# Patient Record
Sex: Female | Born: 1945
Health system: Southern US, Community
[De-identification: ages and names within clinical notes are randomized; demographics above are authoritative.]

## PROBLEM LIST (undated history)

## (undated) ENCOUNTER — Emergency Department (HOSPITAL_COMMUNITY): Payer: Medicare Other | Source: Home / Self Care

## (undated) DIAGNOSIS — F32A Depression, unspecified: Secondary | ICD-10-CM

## (undated) DIAGNOSIS — R011 Cardiac murmur, unspecified: Secondary | ICD-10-CM

## (undated) DIAGNOSIS — C50912 Malignant neoplasm of unspecified site of left female breast: Secondary | ICD-10-CM

## (undated) DIAGNOSIS — G610 Guillain-Barre syndrome: Secondary | ICD-10-CM

## (undated) DIAGNOSIS — M545 Low back pain, unspecified: Secondary | ICD-10-CM

## (undated) DIAGNOSIS — F329 Major depressive disorder, single episode, unspecified: Secondary | ICD-10-CM

## (undated) DIAGNOSIS — J449 Chronic obstructive pulmonary disease, unspecified: Secondary | ICD-10-CM

## (undated) DIAGNOSIS — E785 Hyperlipidemia, unspecified: Secondary | ICD-10-CM

## (undated) DIAGNOSIS — G8929 Other chronic pain: Secondary | ICD-10-CM

## (undated) DIAGNOSIS — J45909 Unspecified asthma, uncomplicated: Secondary | ICD-10-CM

## (undated) DIAGNOSIS — J302 Other seasonal allergic rhinitis: Secondary | ICD-10-CM

## (undated) DIAGNOSIS — K219 Gastro-esophageal reflux disease without esophagitis: Secondary | ICD-10-CM

## (undated) HISTORY — PX: BREAST SURGERY: SHX581

## (undated) HISTORY — DX: Low back pain, unspecified: M54.50

## (undated) HISTORY — DX: Major depressive disorder, single episode, unspecified: F32.9

## (undated) HISTORY — DX: Low back pain: M54.5

## (undated) HISTORY — DX: Hyperlipidemia, unspecified: E78.5

## (undated) HISTORY — DX: Malignant neoplasm of unspecified site of left female breast: C50.912

## (undated) HISTORY — PX: CHOLECYSTECTOMY: SHX55

## (undated) HISTORY — PX: TONSILLECTOMY: SUR1361

## (undated) HISTORY — DX: Unspecified asthma, uncomplicated: J45.909

## (undated) HISTORY — DX: Other seasonal allergic rhinitis: J30.2

## (undated) HISTORY — DX: Other chronic pain: G89.29

## (undated) HISTORY — DX: Depression, unspecified: F32.A

---

## 1995-10-05 DIAGNOSIS — C50912 Malignant neoplasm of unspecified site of left female breast: Secondary | ICD-10-CM

## 1995-10-05 HISTORY — DX: Malignant neoplasm of unspecified site of left female breast: C50.912

## 2014-01-14 ENCOUNTER — Encounter: Payer: Self-pay | Admitting: *Deleted

## 2014-01-15 ENCOUNTER — Ambulatory Visit (INDEPENDENT_AMBULATORY_CARE_PROVIDER_SITE_OTHER): Payer: Medicare Other | Admitting: Cardiology

## 2014-01-15 ENCOUNTER — Encounter: Payer: Self-pay | Admitting: Cardiology

## 2014-01-15 VITALS — BP 123/79 | HR 65 | Ht 59.0 in | Wt 126.8 lb

## 2014-01-15 DIAGNOSIS — I340 Nonrheumatic mitral (valve) insufficiency: Secondary | ICD-10-CM

## 2014-01-15 DIAGNOSIS — E785 Hyperlipidemia, unspecified: Secondary | ICD-10-CM | POA: Insufficient documentation

## 2014-01-15 DIAGNOSIS — I059 Rheumatic mitral valve disease, unspecified: Secondary | ICD-10-CM

## 2014-01-15 DIAGNOSIS — R079 Chest pain, unspecified: Secondary | ICD-10-CM | POA: Insufficient documentation

## 2014-01-15 MED ORDER — NITROGLYCERIN 0.4 MG SL SUBL
0.4000 mg | SUBLINGUAL_TABLET | SUBLINGUAL | Status: DC | PRN
Start: 2014-01-15 — End: 2020-11-05

## 2014-01-15 NOTE — Patient Instructions (Signed)
Your physician has recommended you make the following change in your medication:  Start: Nitro SL 0.4 MG take 1 tablet and place under tongue and let dissolve as needed for chest pain. Repeat every 5 minutes X3 times. If you get no relief by 3rd tablet call 911 or go to ER.  Continue all other medications the same.   Your physician has requested that you have a stress echocardiogram. For further information please visit [email or web address]. Please follow instruction sheet as given.  Your follow up with Dr. Harl Bowie will be based on your test results.

## 2014-01-15 NOTE — Progress Notes (Signed)
Clinical Summary Alexa Nichols is a 68 y.o.female  1. Mitral regurgitation - recent echo by pcp showed mild to moderate MR per report - she has described some SOB, DOE mainly with activities. Denies any LE edema, no orthopnea.    2. Chest pain - started approx 1 year. Describes a pressure midchest 6/10, +SOB, can have occsasional palps. Nothing makes better or worst. Lasts for few minutes. Pain never occurs at rest. No change in frequency, mild increase in severity. Occurs daily.   CAD risk factors: HL, former tobacco x 15 years and quit 53 years, maternal uncle 42s.     Past Medical History  Diagnosis Date  . Other and unspecified hyperlipidemia   . Asthma   . Depression   . Seasonal allergies   . Chronic low back pain   . Lobular carcinoma of left breast 1997    in situ     Allergies  Allergen Reactions  . Other Rash    DURAGESIC/ANALGESICS     Current Outpatient Prescriptions  Medication Sig Dispense Refill  . Calcium Carbonate-Vitamin D (CALTRATE 600+D) 600-400 MG-UNIT per tablet Take 1 tablet by mouth daily.      . fentaNYL (DURAGESIC - DOSED MCG/HR) 25 MCG/HR patch Place 25 mcg onto the skin every 3 (three) days.      . Fluticasone-Salmeterol (ADVAIR) 100-50 MCG/DOSE AEPB Inhale 1 puff into the lungs 2 (two) times daily.      Marland Kitchen lidocaine (LIDODERM) 5 % Place 1 patch onto the skin daily. Remove & Discard patch within 12 hours or as directed by MD      . mometasone-formoterol (DULERA) 200-5 MCG/ACT AERO Inhale 2 puffs into the lungs 2 (two) times daily.      Marland Kitchen omeprazole (PRILOSEC) 20 MG capsule Take 20 mg by mouth daily.      . predniSONE (DELTASONE) 20 MG tablet Take 20 mg by mouth as directed.      . Red Yeast Rice 600 MG CAPS Take 1 capsule by mouth 2 (two) times daily.      . simvastatin (ZOCOR) 20 MG tablet Take 20 mg by mouth daily.       No current facility-administered medications for this visit.     Past Surgical History  Procedure Laterality  Date  . Cholecystectomy    . Cesarean section  1986     Allergies  Allergen Reactions  . Other Rash    DURAGESIC/ANALGESICS      Family History  Problem Relation Age of Onset  . Other Father     head trauma  . Breast cancer Sister     X's 2 sisters  . Osteoporosis Mother      Social History Ms. Klenke reports that she quit smoking about 30 years ago. Her smoking use included Cigarettes. She smoked 0.00 packs per day. She does not have any smokeless tobacco history on file. Ms. Alvis has no alcohol history on file.   Review of Systems CONSTITUTIONAL: No weight loss, fever, chills, weakness or fatigue.  HEENT: Eyes: No visual loss, blurred vision, double vision or yellow sclerae.No hearing loss, sneezing, congestion, runny nose or sore throat.  SKIN: No rash or itching.  CARDIOVASCULAR: per HPI RESPIRATORY: No cough or sputum.  GASTROINTESTINAL: No anorexia, nausea, vomiting or diarrhea. No abdominal pain or blood.  GENITOURINARY: No burning on urination, no polyuria NEUROLOGICAL: No headache, dizziness, syncope, paralysis, ataxia, numbness or tingling in the extremities. No change in bowel or bladder control.  MUSCULOSKELETAL: No muscle, back pain, joint pain or stiffness.  LYMPHATICS: No enlarged nodes. No history of splenectomy.  PSYCHIATRIC: No history of depression or anxiety.  ENDOCRINOLOGIC: No reports of sweating, cold or heat intolerance. No polyuria or polydipsia.  Marland Kitchen   Physical Examination p 65 bp 123/79 Wt 126 lbs BMI 26 Gen: resting comfortably, no acute distress HEENT: no scleral icterus, pupils equal round and reactive, no palptable cervical adenopathy,  CV: RRR, no m/r/g, no JVD, no carotid bruits Resp: Clear to auscultation bilaterally GI: abdomen is soft, non-tender, non-distended, normal bowel sounds, no hepatosplenomegaly MSK: extremities are warm, no edema.  Skin: warm, no rash Neuro:  no focal deficits Psych: appropriate  affect   Diagnostic Studies 12/31/13 Echo LVEF 29-52%, normal diastolic function, mild LAE, mild to mod MR, mild TR  12/20/13 CXR No active disease  Assessment and Plan  1. Mitral regurgitation - mild to moderate by recent echo, normal LVEF, No chamber dilatation by report.  - follow up echo annually  2. Chest pain - describes exertional symptoms suggestive of stable angina. - she does have CAD risk factors - will obtain exercise stress echo to evaluate for ischemia and further risk stratify.       Arnoldo Lenis, M.D., F.A.C.C.

## 2014-01-21 ENCOUNTER — Encounter (HOSPITAL_COMMUNITY): Payer: Self-pay

## 2014-01-21 ENCOUNTER — Ambulatory Visit (HOSPITAL_COMMUNITY)
Admission: RE | Admit: 2014-01-21 | Discharge: 2014-01-21 | Disposition: A | Discharge status: Home / Self Care | Payer: Medicare Other | Source: Ambulatory Visit | Attending: Cardiology | Admitting: Cardiology

## 2014-01-21 DIAGNOSIS — I059 Rheumatic mitral valve disease, unspecified: Secondary | ICD-10-CM | POA: Insufficient documentation

## 2014-01-21 DIAGNOSIS — R079 Chest pain, unspecified: Secondary | ICD-10-CM | POA: Insufficient documentation

## 2014-01-21 DIAGNOSIS — R072 Precordial pain: Secondary | ICD-10-CM

## 2014-01-21 DIAGNOSIS — E785 Hyperlipidemia, unspecified: Secondary | ICD-10-CM | POA: Insufficient documentation

## 2014-01-21 DIAGNOSIS — Z87891 Personal history of nicotine dependence: Secondary | ICD-10-CM | POA: Insufficient documentation

## 2014-01-21 DIAGNOSIS — R0609 Other forms of dyspnea: Secondary | ICD-10-CM | POA: Insufficient documentation

## 2014-01-21 DIAGNOSIS — R0989 Other specified symptoms and signs involving the circulatory and respiratory systems: Secondary | ICD-10-CM | POA: Insufficient documentation

## 2014-01-21 NOTE — Progress Notes (Signed)
Stress Lab Nurses Notes - [facility]  Chela Sutphen 01/21/2014 Reason for doing test: Chest Pain Type of test: Stress Echo Nurse performing test: Gerrit Halls, RN Nuclear Medicine Tech: Not Applicable Echo Tech: Jamison Neighbor MD performing test: Branch/K.Lawrence NP Family MD: Woody Seller Test explained and consent signed: yes IV started: No IV started Symptoms: SOB & Fatigue Treatment/Intervention: None Reason test stopped: fatigue and reached target HR After recovery IV was: NA Patient to return to [place]. Med at :NA Patient discharged: Home Patient's Condition upon discharge was: stable Comments: During test peak BP 149/75 & HR 150.  Recovery BP 128/67 & HR 82.  Symptoms resolved in recovery. Donnajean Lopes

## 2014-01-21 NOTE — Progress Notes (Signed)
*  PRELIMINARY RESULTS* Echocardiogram Echocardiogram Stress Test has been performed.  Alexa Nichols 01/21/2014, 12:04 PM

## 2014-01-23 ENCOUNTER — Telehealth: Payer: Self-pay | Admitting: Cardiology

## 2014-01-23 NOTE — Telephone Encounter (Signed)
Message copied by Lewayne Bunting on Wed Jan 23, 2014  3:14 PM ------      Message from: [place] F      Created: Wed Jan 23, 2014 12:27 PM       Normal stress test. She should see me back in 1 year to continue to follow her leaky mitral valve            Zandra Abts MD ------

## 2014-01-23 NOTE — Telephone Encounter (Signed)
Pt informed of results and verbalized understanding.  

## 2015-03-27 ENCOUNTER — Ambulatory Visit (INDEPENDENT_AMBULATORY_CARE_PROVIDER_SITE_OTHER): Payer: Medicare Other | Admitting: Cardiology

## 2015-03-27 ENCOUNTER — Encounter: Payer: Self-pay | Admitting: Cardiology

## 2015-03-27 VITALS — BP 114/76 | HR 69 | Ht 59.0 in | Wt 127.8 lb

## 2015-03-27 DIAGNOSIS — R0789 Other chest pain: Secondary | ICD-10-CM | POA: Diagnosis not present

## 2015-03-27 DIAGNOSIS — I340 Nonrheumatic mitral (valve) insufficiency: Secondary | ICD-10-CM | POA: Diagnosis not present

## 2015-03-27 DIAGNOSIS — I447 Left bundle-branch block, unspecified: Secondary | ICD-10-CM

## 2015-03-27 NOTE — Progress Notes (Signed)
Clinical Summary Ms. Alexa Nichols is a 69 y.o.female seen today for follow up of the following medical problems.    1. Mitral regurgitation - mild to moderate by echo - reports chronic SOB she assocaites with her asthma, improves with inhalers. No LE edema, no orthopnea, no PND.   2. Chest pain - notes some reflux like pain, radiates up midchest into throat, better with cold liquid. - no exertional pain. Recently had negative stress echo     Past Medical History  Diagnosis Date  . Other and unspecified hyperlipidemia   . Asthma   . Depression   . Seasonal allergies   . Chronic low back pain   . Lobular carcinoma of left breast 1997    in situ     Allergies  Allergen Reactions  . Other Rash    DURAGESIC/ANALGESICS     Current Outpatient Prescriptions  Medication Sig Dispense Refill  . ALPRAZolam (XANAX) 0.5 MG tablet Take 0.25 mg by mouth as needed for anxiety.    Marland Kitchen aspirin 81 MG tablet Take 81 mg by mouth daily.    . Calcium Carb-Cholecalciferol (CALCIUM 600 + D PO) Take 1 tablet by mouth daily.    . Cholecalciferol (VITAMIN D3) 1000 UNITS CAPS Take 1 capsule by mouth daily.    . fentaNYL (DURAGESIC - DOSED MCG/HR) 25 MCG/HR patch Place 25 mcg onto the skin as needed.     . Fluticasone-Salmeterol (ADVAIR) 100-50 MCG/DOSE AEPB Inhale 1 puff into the lungs as needed.     Marland Kitchen HYDROcodone-acetaminophen (NORCO/VICODIN) 5-325 MG per tablet Take 1 tablet by mouth as needed for moderate pain.    Marland Kitchen lidocaine (LIDODERM) 5 % Place 1 patch onto the skin as needed. Remove & Discard patch within 12 hours or as directed by MD    . methocarbamol (ROBAXIN) 500 MG tablet Take 500 mg by mouth as needed for muscle spasms.    . nitroGLYCERIN (NITROSTAT) 0.4 MG SL tablet Place 1 tablet (0.4 mg total) under the tongue every 5 (five) minutes as needed for chest pain. 25 tablet 3  . omeprazole (PRILOSEC) 20 MG capsule Take 20 mg by mouth daily.    . predniSONE (DELTASONE) 20 MG tablet Take 20  mg by mouth as needed.     . simvastatin (ZOCOR) 20 MG tablet Take 20 mg by mouth daily.    Marland Kitchen tiZANidine (ZANAFLEX) 4 MG tablet Take 4 mg by mouth at bedtime as needed for muscle spasms.    Marland Kitchen zolpidem (AMBIEN) 10 MG tablet Take 5 mg by mouth at bedtime.     No current facility-administered medications for this visit.     Past Surgical History  Procedure Laterality Date  . Cholecystectomy    . Cesarean section  1986     Allergies  Allergen Reactions  . Other Rash    DURAGESIC/ANALGESICS      Family History  Problem Relation Age of Onset  . Other Father     head trauma  . Breast cancer Sister     X's 2 sisters  . Osteoporosis Mother      Social History Ms. Lords reports that she quit smoking about 31 years ago. Her smoking use included Cigarettes. She does not have any smokeless tobacco history on file. Ms. Mckell has no alcohol history on file.   Review of Systems CONSTITUTIONAL: No weight loss, fever, chills, weakness or fatigue.  HEENT: Eyes: No visual loss, blurred vision, double vision or yellow sclerae.No hearing loss,  sneezing, congestion, runny nose or sore throat.  SKIN: No rash or itching.  CARDIOVASCULAR: per HPI RESPIRATORY: No shortness of breath, cough or sputum.  GASTROINTESTINAL: No anorexia, nausea, vomiting or diarrhea. No abdominal pain or blood.  GENITOURINARY: No burning on urination, no polyuria NEUROLOGICAL: No headache, dizziness, syncope, paralysis, ataxia, numbness or tingling in the extremities. No change in bowel or bladder control.  MUSCULOSKELETAL: No muscle, back pain, joint pain or stiffness.  LYMPHATICS: No enlarged nodes. No history of splenectomy.  PSYCHIATRIC: No history of depression or anxiety.  ENDOCRINOLOGIC: No reports of sweating, cold or heat intolerance. No polyuria or polydipsia.  Marland Kitchen   Physical Examination Filed Vitals:   03/27/15 1254  BP: 114/76  Pulse: 69   Filed Vitals:   03/27/15 1254  Height: 4\' 11"   (1.499 m)  Weight: 127 lb 12.8 oz (57.97 kg)    Gen: resting comfortably, no acute distress HEENT: no scleral icterus, pupils equal round and reactive, no palptable cervical adenopathy,  CV: RRR, no m/r/g, no JVD Resp: Clear to auscultation bilaterally GI: abdomen is soft, non-tender, non-distended, normal bowel sounds, no hepatosplenomegaly MSK: extremities are warm, no edema.  Skin: warm, no rash Neuro:  no focal deficits Psych: appropriate affect   Diagnostic Studies  12/31/13 Echo LVEF 31-12%, normal diastolic function, mild LAE, mild to mod MR, mild TR  01/2014 stress echo Impressions:  - Normal study after maximal exercise. Duke treadmill score is 7 consistent with low risk for major cardiac events. Very good functional capacity (140% of age and gender predicted.  Clinic EKG reviewed, LBBB  Assessment and Plan   1. Mitral regurgitation - mild to moderate by recent echo, normal LVEF, No chamber dilatation by report.  - continue to follow clinically  2. Chest pain - symptoms consistent with GI etiology - negative stress echo 01/2014 - no further cardiac workup at this time  3. Left bundle branch block - new compared to prior EKG. Recent cardiac testing last year with normal echo and normal stress echo, no evidence of underlying cardiac disease - continue to follow     Arnoldo Lenis, M.D.

## 2015-03-27 NOTE — Patient Instructions (Signed)
Your physician wants you to follow-up in: 1 year with Dr. Branch  You will receive a reminder letter in the mail two months in advance. If you don't receive a letter, please call our office to schedule the follow-up appointment.  Your physician recommends that you continue on your current medications as directed. Please refer to the Current Medication list given to you today.  Thank you for choosing [facility]!!   

## 2015-09-30 ENCOUNTER — Encounter: Payer: Self-pay | Admitting: Cardiology

## 2015-10-07 DIAGNOSIS — Z803 Family history of malignant neoplasm of breast: Secondary | ICD-10-CM | POA: Diagnosis not present

## 2015-10-07 DIAGNOSIS — Z853 Personal history of malignant neoplasm of breast: Secondary | ICD-10-CM | POA: Diagnosis not present

## 2015-10-14 DIAGNOSIS — H2513 Age-related nuclear cataract, bilateral: Secondary | ICD-10-CM | POA: Diagnosis not present

## 2015-10-14 DIAGNOSIS — H538 Other visual disturbances: Secondary | ICD-10-CM | POA: Diagnosis not present

## 2015-10-14 DIAGNOSIS — H35372 Puckering of macula, left eye: Secondary | ICD-10-CM | POA: Diagnosis not present

## 2015-10-23 DIAGNOSIS — Z853 Personal history of malignant neoplasm of breast: Secondary | ICD-10-CM | POA: Diagnosis not present

## 2015-10-23 DIAGNOSIS — Z803 Family history of malignant neoplasm of breast: Secondary | ICD-10-CM | POA: Diagnosis not present

## 2015-10-23 DIAGNOSIS — J45909 Unspecified asthma, uncomplicated: Secondary | ICD-10-CM | POA: Diagnosis not present

## 2015-10-23 DIAGNOSIS — Z7982 Long term (current) use of aspirin: Secondary | ICD-10-CM | POA: Diagnosis not present

## 2015-10-23 DIAGNOSIS — R195 Other fecal abnormalities: Secondary | ICD-10-CM | POA: Diagnosis not present

## 2015-10-23 DIAGNOSIS — Z7951 Long term (current) use of inhaled steroids: Secondary | ICD-10-CM | POA: Diagnosis not present

## 2015-10-23 DIAGNOSIS — Z1211 Encounter for screening for malignant neoplasm of colon: Secondary | ICD-10-CM | POA: Diagnosis not present

## 2015-10-23 DIAGNOSIS — Z79899 Other long term (current) drug therapy: Secondary | ICD-10-CM | POA: Diagnosis not present

## 2015-11-11 DIAGNOSIS — Z853 Personal history of malignant neoplasm of breast: Secondary | ICD-10-CM | POA: Diagnosis not present

## 2015-12-07 DIAGNOSIS — K529 Noninfective gastroenteritis and colitis, unspecified: Secondary | ICD-10-CM | POA: Diagnosis not present

## 2015-12-07 DIAGNOSIS — K922 Gastrointestinal hemorrhage, unspecified: Secondary | ICD-10-CM | POA: Diagnosis not present

## 2015-12-07 DIAGNOSIS — R1030 Lower abdominal pain, unspecified: Secondary | ICD-10-CM | POA: Diagnosis not present

## 2015-12-07 DIAGNOSIS — R938 Abnormal findings on diagnostic imaging of other specified body structures: Secondary | ICD-10-CM | POA: Diagnosis not present

## 2015-12-07 DIAGNOSIS — R1084 Generalized abdominal pain: Secondary | ICD-10-CM | POA: Diagnosis not present

## 2015-12-07 DIAGNOSIS — D72829 Elevated white blood cell count, unspecified: Secondary | ICD-10-CM | POA: Diagnosis not present

## 2015-12-07 DIAGNOSIS — A088 Other specified intestinal infections: Secondary | ICD-10-CM | POA: Diagnosis not present

## 2015-12-07 DIAGNOSIS — E785 Hyperlipidemia, unspecified: Secondary | ICD-10-CM | POA: Diagnosis not present

## 2015-12-07 DIAGNOSIS — J45909 Unspecified asthma, uncomplicated: Secondary | ICD-10-CM | POA: Diagnosis not present

## 2015-12-07 DIAGNOSIS — A09 Infectious gastroenteritis and colitis, unspecified: Secondary | ICD-10-CM | POA: Diagnosis not present

## 2015-12-08 DIAGNOSIS — K625 Hemorrhage of anus and rectum: Secondary | ICD-10-CM | POA: Diagnosis not present

## 2015-12-08 DIAGNOSIS — D72829 Elevated white blood cell count, unspecified: Secondary | ICD-10-CM | POA: Diagnosis present

## 2015-12-08 DIAGNOSIS — M810 Age-related osteoporosis without current pathological fracture: Secondary | ICD-10-CM | POA: Diagnosis present

## 2015-12-08 DIAGNOSIS — K922 Gastrointestinal hemorrhage, unspecified: Secondary | ICD-10-CM | POA: Diagnosis present

## 2015-12-08 DIAGNOSIS — Z7982 Long term (current) use of aspirin: Secondary | ICD-10-CM | POA: Diagnosis not present

## 2015-12-08 DIAGNOSIS — E785 Hyperlipidemia, unspecified: Secondary | ICD-10-CM | POA: Diagnosis present

## 2015-12-08 DIAGNOSIS — J45909 Unspecified asthma, uncomplicated: Secondary | ICD-10-CM | POA: Diagnosis present

## 2015-12-08 DIAGNOSIS — Z853 Personal history of malignant neoplasm of breast: Secondary | ICD-10-CM | POA: Diagnosis not present

## 2015-12-08 DIAGNOSIS — R197 Diarrhea, unspecified: Secondary | ICD-10-CM | POA: Diagnosis not present

## 2015-12-08 DIAGNOSIS — A088 Other specified intestinal infections: Secondary | ICD-10-CM | POA: Diagnosis not present

## 2015-12-08 DIAGNOSIS — K219 Gastro-esophageal reflux disease without esophagitis: Secondary | ICD-10-CM | POA: Diagnosis present

## 2015-12-08 DIAGNOSIS — A09 Infectious gastroenteritis and colitis, unspecified: Secondary | ICD-10-CM | POA: Diagnosis not present

## 2015-12-08 DIAGNOSIS — R938 Abnormal findings on diagnostic imaging of other specified body structures: Secondary | ICD-10-CM | POA: Diagnosis present

## 2015-12-17 DIAGNOSIS — K529 Noninfective gastroenteritis and colitis, unspecified: Secondary | ICD-10-CM | POA: Diagnosis not present

## 2015-12-17 DIAGNOSIS — Z09 Encounter for follow-up examination after completed treatment for conditions other than malignant neoplasm: Secondary | ICD-10-CM | POA: Diagnosis not present

## 2015-12-17 DIAGNOSIS — I10 Essential (primary) hypertension: Secondary | ICD-10-CM | POA: Diagnosis not present

## 2015-12-29 DIAGNOSIS — I10 Essential (primary) hypertension: Secondary | ICD-10-CM | POA: Diagnosis not present

## 2015-12-29 DIAGNOSIS — Z789 Other specified health status: Secondary | ICD-10-CM | POA: Diagnosis not present

## 2015-12-29 DIAGNOSIS — G4700 Insomnia, unspecified: Secondary | ICD-10-CM | POA: Diagnosis not present

## 2015-12-29 DIAGNOSIS — K529 Noninfective gastroenteritis and colitis, unspecified: Secondary | ICD-10-CM | POA: Diagnosis not present

## 2016-01-20 DIAGNOSIS — E7800 Pure hypercholesterolemia, unspecified: Secondary | ICD-10-CM | POA: Diagnosis not present

## 2016-01-20 DIAGNOSIS — I10 Essential (primary) hypertension: Secondary | ICD-10-CM | POA: Diagnosis not present

## 2016-02-09 ENCOUNTER — Encounter: Payer: Self-pay | Admitting: Cardiology

## 2016-02-09 DIAGNOSIS — I089 Rheumatic multiple valve disease, unspecified: Secondary | ICD-10-CM | POA: Diagnosis not present

## 2016-02-12 DIAGNOSIS — Z299 Encounter for prophylactic measures, unspecified: Secondary | ICD-10-CM | POA: Diagnosis not present

## 2016-02-12 DIAGNOSIS — R197 Diarrhea, unspecified: Secondary | ICD-10-CM | POA: Diagnosis not present

## 2016-02-12 DIAGNOSIS — K529 Noninfective gastroenteritis and colitis, unspecified: Secondary | ICD-10-CM | POA: Diagnosis not present

## 2016-02-19 DIAGNOSIS — I10 Essential (primary) hypertension: Secondary | ICD-10-CM | POA: Diagnosis not present

## 2016-02-19 DIAGNOSIS — E7800 Pure hypercholesterolemia, unspecified: Secondary | ICD-10-CM | POA: Diagnosis not present

## 2016-03-09 DIAGNOSIS — M546 Pain in thoracic spine: Secondary | ICD-10-CM | POA: Diagnosis not present

## 2016-03-09 DIAGNOSIS — M419 Scoliosis, unspecified: Secondary | ICD-10-CM | POA: Diagnosis not present

## 2016-03-09 DIAGNOSIS — M4184 Other forms of scoliosis, thoracic region: Secondary | ICD-10-CM | POA: Diagnosis not present

## 2016-03-09 DIAGNOSIS — I10 Essential (primary) hypertension: Secondary | ICD-10-CM | POA: Diagnosis not present

## 2016-03-09 DIAGNOSIS — M47814 Spondylosis without myelopathy or radiculopathy, thoracic region: Secondary | ICD-10-CM | POA: Diagnosis not present

## 2016-03-09 DIAGNOSIS — J309 Allergic rhinitis, unspecified: Secondary | ICD-10-CM | POA: Diagnosis not present

## 2016-03-09 DIAGNOSIS — R0600 Dyspnea, unspecified: Secondary | ICD-10-CM | POA: Diagnosis not present

## 2016-03-09 DIAGNOSIS — Z6825 Body mass index (BMI) 25.0-25.9, adult: Secondary | ICD-10-CM | POA: Diagnosis not present

## 2016-03-09 DIAGNOSIS — R0602 Shortness of breath: Secondary | ICD-10-CM | POA: Diagnosis not present

## 2016-03-09 DIAGNOSIS — M8588 Other specified disorders of bone density and structure, other site: Secondary | ICD-10-CM | POA: Diagnosis not present

## 2016-03-17 DIAGNOSIS — R0602 Shortness of breath: Secondary | ICD-10-CM | POA: Diagnosis not present

## 2016-03-24 DIAGNOSIS — M546 Pain in thoracic spine: Secondary | ICD-10-CM | POA: Diagnosis not present

## 2016-03-24 DIAGNOSIS — J45909 Unspecified asthma, uncomplicated: Secondary | ICD-10-CM | POA: Diagnosis not present

## 2016-03-25 DIAGNOSIS — E7800 Pure hypercholesterolemia, unspecified: Secondary | ICD-10-CM | POA: Diagnosis not present

## 2016-03-25 DIAGNOSIS — I10 Essential (primary) hypertension: Secondary | ICD-10-CM | POA: Diagnosis not present

## 2016-04-01 ENCOUNTER — Encounter: Payer: Self-pay | Admitting: Cardiology

## 2016-04-01 ENCOUNTER — Ambulatory Visit (INDEPENDENT_AMBULATORY_CARE_PROVIDER_SITE_OTHER): Payer: Medicare Other | Admitting: Cardiology

## 2016-04-01 ENCOUNTER — Encounter: Payer: Self-pay | Admitting: *Deleted

## 2016-04-01 VITALS — BP 117/76 | HR 64 | Ht 59.0 in | Wt 129.0 lb

## 2016-04-01 DIAGNOSIS — I340 Nonrheumatic mitral (valve) insufficiency: Secondary | ICD-10-CM | POA: Diagnosis not present

## 2016-04-01 NOTE — Progress Notes (Signed)
Clinical Summary Alexa Nichols is a 70 y.o.female seen today for follow up of the following medical problems.   1. Mitral regurgitation - mild to moderate by echo - reports chronic SOB she assocaites with her asthma, improves with inhalers. No recent LE edema, no orthopnea, no PND.     2. Asthma - followed by Dr Woody Seller.   Past Medical History  Diagnosis Date  . Other and unspecified hyperlipidemia   . Asthma   . Depression   . Seasonal allergies   . Chronic low back pain   . Lobular carcinoma of left breast 1997    in situ     Allergies  Allergen Reactions  . Other Rash    DURAGESIC/ANALGESICS - gel filled per patient      Current Outpatient Prescriptions  Medication Sig Dispense Refill  . ALPRAZolam (XANAX) 0.5 MG tablet Take 0.25 mg by mouth as needed for anxiety.    Marland Kitchen aspirin 81 MG tablet Take 81 mg by mouth daily.    . Calcium Carb-Cholecalciferol (CALCIUM 600 + D PO) Take 1 tablet by mouth daily.    . Cholecalciferol (VITAMIN D3) 1000 UNITS CAPS Take 1 capsule by mouth daily.    . fentaNYL (DURAGESIC - DOSED MCG/HR) 25 MCG/HR patch Place 25 mcg onto the skin as needed.     . Fluticasone-Salmeterol (ADVAIR) 100-50 MCG/DOSE AEPB Inhale 1 puff into the lungs as needed.     Marland Kitchen HYDROcodone-acetaminophen (NORCO/VICODIN) 5-325 MG per tablet Take 1 tablet by mouth as needed for moderate pain.    Marland Kitchen lidocaine (LIDODERM) 5 % Place 1 patch onto the skin as needed. Remove & Discard patch within 12 hours or as directed by MD    . methocarbamol (ROBAXIN) 500 MG tablet Take 250 mg by mouth as needed for muscle spasms.     . nitroGLYCERIN (NITROSTAT) 0.4 MG SL tablet Place 1 tablet (0.4 mg total) under the tongue every 5 (five) minutes as needed for chest pain. 25 tablet 3  . omeprazole (PRILOSEC) 20 MG capsule Take 20 mg by mouth daily.    . predniSONE (DELTASONE) 20 MG tablet Take 20 mg by mouth as needed.     . simvastatin (ZOCOR) 20 MG tablet Take 20 mg by mouth daily.    Marland Kitchen  tiZANidine (ZANAFLEX) 4 MG tablet Take 4 mg by mouth at bedtime as needed for muscle spasms.    Marland Kitchen zolpidem (AMBIEN) 10 MG tablet Take 5 mg by mouth at bedtime.     No current facility-administered medications for this visit.     Past Surgical History  Procedure Laterality Date  . Cholecystectomy    . Cesarean section  1986     Allergies  Allergen Reactions  . Other Rash    DURAGESIC/ANALGESICS - gel filled per patient       Family History  Problem Relation Age of Onset  . Other Father     head trauma  . Breast cancer Sister     X's 2 sisters  . Osteoporosis Mother      Social History Alexa Nichols reports that she quit smoking about 32 years ago. Her smoking use included Cigarettes. She does not have any smokeless tobacco history on file. Alexa Nichols has no alcohol history on file.   Review of Systems CONSTITUTIONAL: No weight loss, fever, chills, weakness or fatigue.  HEENT: Eyes: No visual loss, blurred vision, double vision or yellow sclerae.No hearing loss, sneezing, congestion, runny nose or sore throat.  SKIN: No rash or itching.  CARDIOVASCULAR: per HPI RESPIRATORY: occasional SOB GASTROINTESTINAL: No anorexia, nausea, vomiting or diarrhea. No abdominal pain or blood.  GENITOURINARY: No burning on urination, no polyuria NEUROLOGICAL: No headache, dizziness, syncope, paralysis, ataxia, numbness or tingling in the extremities. No change in bowel or bladder control.  MUSCULOSKELETAL: No muscle, back pain, joint pain or stiffness.  LYMPHATICS: No enlarged nodes. No history of splenectomy.  PSYCHIATRIC: No history of depression or anxiety.  ENDOCRINOLOGIC: No reports of sweating, cold or heat intolerance. No polyuria or polydipsia.  Marland Kitchen   Physical Examination Filed Vitals:   04/01/16 1352  BP: 117/76  Pulse: 64   Filed Vitals:   04/01/16 1352  Height: 4\' 11"  (1.499 m)  Weight: 129 lb (58.514 kg)    Gen: resting comfortably, no acute distress HEENT: no  scleral icterus, pupils equal round and reactive, no palptable cervical adenopathy,  CV: RRR, 2/6 systolic murmur apex, no jvd Resp: Clear to auscultation bilaterally GI: abdomen is soft, non-tender, non-distended, normal bowel sounds, no hepatosplenomegaly MSK: extremities are warm, no edema.  Skin: warm, no rash Neuro:  no focal deficits Psych: appropriate affect   Diagnostic Studies 12/31/13 Echo LVEF 0000000, normal diastolic function, mild LAE, mild to mod MR, mild TR  01/2014 stress echo Impressions:  - Normal study after maximal exercise. Duke treadmill score is 7 consistent with low risk for major cardiac events. Very good functional capacity (140% of age and gender predicted.  Clinic EKG reviewed, LBBB  04/01/16 Clinic EKG: NSR  Assessment and Plan   1. Mitral regurgitation - mild to moderate by last echo that we have report on, she reports more recent study with pcp, we will request results - no current symptoms   F/u 1 year     Arnoldo Lenis, M.D.

## 2016-04-01 NOTE — Patient Instructions (Signed)

## 2016-04-02 DIAGNOSIS — R1013 Epigastric pain: Secondary | ICD-10-CM | POA: Diagnosis not present

## 2016-04-27 DIAGNOSIS — H35372 Puckering of macula, left eye: Secondary | ICD-10-CM | POA: Diagnosis not present

## 2016-04-27 DIAGNOSIS — H538 Other visual disturbances: Secondary | ICD-10-CM | POA: Diagnosis not present

## 2016-04-27 DIAGNOSIS — H2513 Age-related nuclear cataract, bilateral: Secondary | ICD-10-CM | POA: Diagnosis not present

## 2016-04-28 DIAGNOSIS — I10 Essential (primary) hypertension: Secondary | ICD-10-CM | POA: Diagnosis not present

## 2016-04-28 DIAGNOSIS — E7800 Pure hypercholesterolemia, unspecified: Secondary | ICD-10-CM | POA: Diagnosis not present

## 2016-06-03 DIAGNOSIS — I10 Essential (primary) hypertension: Secondary | ICD-10-CM | POA: Diagnosis not present

## 2016-06-03 DIAGNOSIS — E7800 Pure hypercholesterolemia, unspecified: Secondary | ICD-10-CM | POA: Diagnosis not present

## 2016-06-14 DIAGNOSIS — Z23 Encounter for immunization: Secondary | ICD-10-CM | POA: Diagnosis not present

## 2016-07-15 DIAGNOSIS — I10 Essential (primary) hypertension: Secondary | ICD-10-CM | POA: Diagnosis not present

## 2016-07-15 DIAGNOSIS — E7800 Pure hypercholesterolemia, unspecified: Secondary | ICD-10-CM | POA: Diagnosis not present

## 2016-08-10 DIAGNOSIS — I10 Essential (primary) hypertension: Secondary | ICD-10-CM | POA: Diagnosis not present

## 2016-08-10 DIAGNOSIS — E7800 Pure hypercholesterolemia, unspecified: Secondary | ICD-10-CM | POA: Diagnosis not present

## 2016-09-16 DIAGNOSIS — Z1231 Encounter for screening mammogram for malignant neoplasm of breast: Secondary | ICD-10-CM | POA: Diagnosis not present

## 2016-09-29 DIAGNOSIS — E7800 Pure hypercholesterolemia, unspecified: Secondary | ICD-10-CM | POA: Diagnosis not present

## 2016-09-29 DIAGNOSIS — I10 Essential (primary) hypertension: Secondary | ICD-10-CM | POA: Diagnosis not present

## 2016-10-01 DIAGNOSIS — Z299 Encounter for prophylactic measures, unspecified: Secondary | ICD-10-CM | POA: Diagnosis not present

## 2016-10-01 DIAGNOSIS — Z6826 Body mass index (BMI) 26.0-26.9, adult: Secondary | ICD-10-CM | POA: Diagnosis not present

## 2016-10-01 DIAGNOSIS — Z1389 Encounter for screening for other disorder: Secondary | ICD-10-CM | POA: Diagnosis not present

## 2016-10-01 DIAGNOSIS — M545 Low back pain: Secondary | ICD-10-CM | POA: Diagnosis not present

## 2016-10-01 DIAGNOSIS — Z1211 Encounter for screening for malignant neoplasm of colon: Secondary | ICD-10-CM | POA: Diagnosis not present

## 2016-10-01 DIAGNOSIS — G4700 Insomnia, unspecified: Secondary | ICD-10-CM | POA: Diagnosis not present

## 2016-10-01 DIAGNOSIS — Z79899 Other long term (current) drug therapy: Secondary | ICD-10-CM | POA: Diagnosis not present

## 2016-10-01 DIAGNOSIS — R5383 Other fatigue: Secondary | ICD-10-CM | POA: Diagnosis not present

## 2016-10-01 DIAGNOSIS — Z7189 Other specified counseling: Secondary | ICD-10-CM | POA: Diagnosis not present

## 2016-10-01 DIAGNOSIS — E7800 Pure hypercholesterolemia, unspecified: Secondary | ICD-10-CM | POA: Diagnosis not present

## 2016-10-01 DIAGNOSIS — Z0000 Encounter for general adult medical examination without abnormal findings: Secondary | ICD-10-CM | POA: Diagnosis not present

## 2016-10-06 DIAGNOSIS — H2512 Age-related nuclear cataract, left eye: Secondary | ICD-10-CM | POA: Diagnosis not present

## 2016-10-06 DIAGNOSIS — H25811 Combined forms of age-related cataract, right eye: Secondary | ICD-10-CM | POA: Diagnosis not present

## 2016-10-06 DIAGNOSIS — H04123 Dry eye syndrome of bilateral lacrimal glands: Secondary | ICD-10-CM | POA: Diagnosis not present

## 2016-10-06 DIAGNOSIS — H353111 Nonexudative age-related macular degeneration, right eye, early dry stage: Secondary | ICD-10-CM | POA: Diagnosis not present

## 2016-10-18 DIAGNOSIS — J329 Chronic sinusitis, unspecified: Secondary | ICD-10-CM | POA: Diagnosis not present

## 2016-10-18 DIAGNOSIS — J309 Allergic rhinitis, unspecified: Secondary | ICD-10-CM | POA: Diagnosis not present

## 2016-10-18 DIAGNOSIS — J339 Nasal polyp, unspecified: Secondary | ICD-10-CM | POA: Diagnosis not present

## 2016-10-18 DIAGNOSIS — J342 Deviated nasal septum: Secondary | ICD-10-CM | POA: Diagnosis not present

## 2016-10-18 DIAGNOSIS — R438 Other disturbances of smell and taste: Secondary | ICD-10-CM | POA: Diagnosis not present

## 2016-10-22 DIAGNOSIS — J339 Nasal polyp, unspecified: Secondary | ICD-10-CM | POA: Diagnosis not present

## 2016-11-01 DIAGNOSIS — E7800 Pure hypercholesterolemia, unspecified: Secondary | ICD-10-CM | POA: Diagnosis not present

## 2016-11-01 DIAGNOSIS — I10 Essential (primary) hypertension: Secondary | ICD-10-CM | POA: Diagnosis not present

## 2016-11-04 DIAGNOSIS — E2839 Other primary ovarian failure: Secondary | ICD-10-CM | POA: Diagnosis not present

## 2016-11-05 DIAGNOSIS — R438 Other disturbances of smell and taste: Secondary | ICD-10-CM | POA: Diagnosis not present

## 2016-11-05 DIAGNOSIS — J309 Allergic rhinitis, unspecified: Secondary | ICD-10-CM | POA: Diagnosis not present

## 2016-11-05 DIAGNOSIS — J339 Nasal polyp, unspecified: Secondary | ICD-10-CM | POA: Diagnosis not present

## 2016-11-05 DIAGNOSIS — J324 Chronic pansinusitis: Secondary | ICD-10-CM | POA: Diagnosis not present

## 2016-11-05 DIAGNOSIS — J342 Deviated nasal septum: Secondary | ICD-10-CM | POA: Diagnosis not present

## 2016-11-15 DIAGNOSIS — J3081 Allergic rhinitis due to animal (cat) (dog) hair and dander: Secondary | ICD-10-CM | POA: Diagnosis not present

## 2016-11-15 DIAGNOSIS — J301 Allergic rhinitis due to pollen: Secondary | ICD-10-CM | POA: Diagnosis not present

## 2016-11-15 DIAGNOSIS — J3089 Other allergic rhinitis: Secondary | ICD-10-CM | POA: Diagnosis not present

## 2016-11-18 DIAGNOSIS — J309 Allergic rhinitis, unspecified: Secondary | ICD-10-CM | POA: Diagnosis not present

## 2016-11-18 DIAGNOSIS — J324 Chronic pansinusitis: Secondary | ICD-10-CM | POA: Diagnosis not present

## 2016-12-01 DIAGNOSIS — I10 Essential (primary) hypertension: Secondary | ICD-10-CM | POA: Diagnosis not present

## 2016-12-01 DIAGNOSIS — E7800 Pure hypercholesterolemia, unspecified: Secondary | ICD-10-CM | POA: Diagnosis not present

## 2016-12-04 DIAGNOSIS — J069 Acute upper respiratory infection, unspecified: Secondary | ICD-10-CM | POA: Diagnosis not present

## 2016-12-04 DIAGNOSIS — R0689 Other abnormalities of breathing: Secondary | ICD-10-CM | POA: Diagnosis not present

## 2016-12-08 DIAGNOSIS — J342 Deviated nasal septum: Secondary | ICD-10-CM | POA: Diagnosis not present

## 2016-12-08 DIAGNOSIS — Z85118 Personal history of other malignant neoplasm of bronchus and lung: Secondary | ICD-10-CM | POA: Diagnosis not present

## 2016-12-08 DIAGNOSIS — F419 Anxiety disorder, unspecified: Secondary | ICD-10-CM | POA: Diagnosis not present

## 2016-12-08 DIAGNOSIS — J324 Chronic pansinusitis: Secondary | ICD-10-CM | POA: Diagnosis not present

## 2016-12-08 DIAGNOSIS — J338 Other polyp of sinus: Secondary | ICD-10-CM | POA: Diagnosis not present

## 2016-12-08 DIAGNOSIS — J331 Polypoid sinus degeneration: Secondary | ICD-10-CM | POA: Diagnosis not present

## 2016-12-08 DIAGNOSIS — Z885 Allergy status to narcotic agent status: Secondary | ICD-10-CM | POA: Diagnosis not present

## 2016-12-08 DIAGNOSIS — F329 Major depressive disorder, single episode, unspecified: Secondary | ICD-10-CM | POA: Diagnosis not present

## 2016-12-08 DIAGNOSIS — E785 Hyperlipidemia, unspecified: Secondary | ICD-10-CM | POA: Diagnosis not present

## 2016-12-08 DIAGNOSIS — J45909 Unspecified asthma, uncomplicated: Secondary | ICD-10-CM | POA: Diagnosis not present

## 2016-12-08 DIAGNOSIS — K219 Gastro-esophageal reflux disease without esophagitis: Secondary | ICD-10-CM | POA: Diagnosis not present

## 2016-12-15 DIAGNOSIS — J324 Chronic pansinusitis: Secondary | ICD-10-CM | POA: Diagnosis not present

## 2016-12-15 DIAGNOSIS — J45909 Unspecified asthma, uncomplicated: Secondary | ICD-10-CM | POA: Diagnosis not present

## 2016-12-15 DIAGNOSIS — K219 Gastro-esophageal reflux disease without esophagitis: Secondary | ICD-10-CM | POA: Diagnosis not present

## 2016-12-15 DIAGNOSIS — J331 Polypoid sinus degeneration: Secondary | ICD-10-CM | POA: Diagnosis not present

## 2016-12-15 DIAGNOSIS — B49 Unspecified mycosis: Secondary | ICD-10-CM | POA: Diagnosis not present

## 2016-12-15 DIAGNOSIS — J342 Deviated nasal septum: Secondary | ICD-10-CM | POA: Diagnosis not present

## 2016-12-15 DIAGNOSIS — J339 Nasal polyp, unspecified: Secondary | ICD-10-CM | POA: Diagnosis not present

## 2016-12-15 DIAGNOSIS — J338 Other polyp of sinus: Secondary | ICD-10-CM | POA: Diagnosis not present

## 2016-12-15 DIAGNOSIS — Z87891 Personal history of nicotine dependence: Secondary | ICD-10-CM | POA: Diagnosis not present

## 2016-12-15 DIAGNOSIS — J329 Chronic sinusitis, unspecified: Secondary | ICD-10-CM | POA: Diagnosis not present

## 2016-12-24 DIAGNOSIS — J324 Chronic pansinusitis: Secondary | ICD-10-CM | POA: Diagnosis not present

## 2016-12-29 DIAGNOSIS — I10 Essential (primary) hypertension: Secondary | ICD-10-CM | POA: Diagnosis not present

## 2016-12-29 DIAGNOSIS — E7800 Pure hypercholesterolemia, unspecified: Secondary | ICD-10-CM | POA: Diagnosis not present

## 2017-01-04 DIAGNOSIS — J3089 Other allergic rhinitis: Secondary | ICD-10-CM | POA: Diagnosis not present

## 2017-01-07 DIAGNOSIS — J309 Allergic rhinitis, unspecified: Secondary | ICD-10-CM | POA: Diagnosis not present

## 2017-01-07 DIAGNOSIS — J324 Chronic pansinusitis: Secondary | ICD-10-CM | POA: Diagnosis not present

## 2017-01-07 DIAGNOSIS — J339 Nasal polyp, unspecified: Secondary | ICD-10-CM | POA: Diagnosis not present

## 2017-01-07 DIAGNOSIS — J3089 Other allergic rhinitis: Secondary | ICD-10-CM | POA: Diagnosis not present

## 2017-01-11 DIAGNOSIS — J3089 Other allergic rhinitis: Secondary | ICD-10-CM | POA: Diagnosis not present

## 2017-01-14 DIAGNOSIS — J3089 Other allergic rhinitis: Secondary | ICD-10-CM | POA: Diagnosis not present

## 2017-01-18 DIAGNOSIS — J324 Chronic pansinusitis: Secondary | ICD-10-CM | POA: Diagnosis not present

## 2017-01-18 DIAGNOSIS — J309 Allergic rhinitis, unspecified: Secondary | ICD-10-CM | POA: Diagnosis not present

## 2017-01-18 DIAGNOSIS — J339 Nasal polyp, unspecified: Secondary | ICD-10-CM | POA: Diagnosis not present

## 2017-01-18 DIAGNOSIS — J3089 Other allergic rhinitis: Secondary | ICD-10-CM | POA: Diagnosis not present

## 2017-01-21 DIAGNOSIS — J3089 Other allergic rhinitis: Secondary | ICD-10-CM | POA: Diagnosis not present

## 2017-02-01 DIAGNOSIS — J301 Allergic rhinitis due to pollen: Secondary | ICD-10-CM | POA: Diagnosis not present

## 2017-02-01 DIAGNOSIS — J3089 Other allergic rhinitis: Secondary | ICD-10-CM | POA: Diagnosis not present

## 2017-02-01 DIAGNOSIS — J3081 Allergic rhinitis due to animal (cat) (dog) hair and dander: Secondary | ICD-10-CM | POA: Diagnosis not present

## 2017-02-08 DIAGNOSIS — J301 Allergic rhinitis due to pollen: Secondary | ICD-10-CM | POA: Diagnosis not present

## 2017-02-08 DIAGNOSIS — J3089 Other allergic rhinitis: Secondary | ICD-10-CM | POA: Diagnosis not present

## 2017-02-08 DIAGNOSIS — J339 Nasal polyp, unspecified: Secondary | ICD-10-CM | POA: Diagnosis not present

## 2017-02-08 DIAGNOSIS — J324 Chronic pansinusitis: Secondary | ICD-10-CM | POA: Diagnosis not present

## 2017-02-11 DIAGNOSIS — J3089 Other allergic rhinitis: Secondary | ICD-10-CM | POA: Diagnosis not present

## 2017-02-15 DIAGNOSIS — J3089 Other allergic rhinitis: Secondary | ICD-10-CM | POA: Diagnosis not present

## 2017-02-18 DIAGNOSIS — J3089 Other allergic rhinitis: Secondary | ICD-10-CM | POA: Diagnosis not present

## 2017-02-22 DIAGNOSIS — J3089 Other allergic rhinitis: Secondary | ICD-10-CM | POA: Diagnosis not present

## 2017-02-24 DIAGNOSIS — K210 Gastro-esophageal reflux disease with esophagitis: Secondary | ICD-10-CM | POA: Diagnosis not present

## 2017-02-24 DIAGNOSIS — Z6828 Body mass index (BMI) 28.0-28.9, adult: Secondary | ICD-10-CM | POA: Diagnosis not present

## 2017-02-24 DIAGNOSIS — Z299 Encounter for prophylactic measures, unspecified: Secondary | ICD-10-CM | POA: Diagnosis not present

## 2017-02-24 DIAGNOSIS — A09 Infectious gastroenteritis and colitis, unspecified: Secondary | ICD-10-CM | POA: Diagnosis not present

## 2017-02-24 DIAGNOSIS — H353111 Nonexudative age-related macular degeneration, right eye, early dry stage: Secondary | ICD-10-CM | POA: Diagnosis not present

## 2017-02-24 DIAGNOSIS — M810 Age-related osteoporosis without current pathological fracture: Secondary | ICD-10-CM | POA: Diagnosis not present

## 2017-02-24 DIAGNOSIS — H25811 Combined forms of age-related cataract, right eye: Secondary | ICD-10-CM | POA: Diagnosis not present

## 2017-02-24 DIAGNOSIS — E785 Hyperlipidemia, unspecified: Secondary | ICD-10-CM | POA: Diagnosis not present

## 2017-02-24 DIAGNOSIS — F329 Major depressive disorder, single episode, unspecified: Secondary | ICD-10-CM | POA: Diagnosis not present

## 2017-02-24 DIAGNOSIS — K921 Melena: Secondary | ICD-10-CM | POA: Diagnosis not present

## 2017-02-24 DIAGNOSIS — H04123 Dry eye syndrome of bilateral lacrimal glands: Secondary | ICD-10-CM | POA: Diagnosis not present

## 2017-02-24 DIAGNOSIS — H35711 Central serous chorioretinopathy, right eye: Secondary | ICD-10-CM | POA: Diagnosis not present

## 2017-02-24 DIAGNOSIS — I10 Essential (primary) hypertension: Secondary | ICD-10-CM | POA: Diagnosis not present

## 2017-02-24 DIAGNOSIS — H43821 Vitreomacular adhesion, right eye: Secondary | ICD-10-CM | POA: Diagnosis not present

## 2017-02-24 DIAGNOSIS — H2512 Age-related nuclear cataract, left eye: Secondary | ICD-10-CM | POA: Diagnosis not present

## 2017-02-24 DIAGNOSIS — F419 Anxiety disorder, unspecified: Secondary | ICD-10-CM | POA: Diagnosis not present

## 2017-02-25 DIAGNOSIS — J3089 Other allergic rhinitis: Secondary | ICD-10-CM | POA: Diagnosis not present

## 2017-03-01 DIAGNOSIS — J3089 Other allergic rhinitis: Secondary | ICD-10-CM | POA: Diagnosis not present

## 2017-03-04 DIAGNOSIS — F329 Major depressive disorder, single episode, unspecified: Secondary | ICD-10-CM | POA: Diagnosis not present

## 2017-03-04 DIAGNOSIS — Z6820 Body mass index (BMI) 20.0-20.9, adult: Secondary | ICD-10-CM | POA: Diagnosis not present

## 2017-03-04 DIAGNOSIS — Z713 Dietary counseling and surveillance: Secondary | ICD-10-CM | POA: Diagnosis not present

## 2017-03-04 DIAGNOSIS — R5383 Other fatigue: Secondary | ICD-10-CM | POA: Diagnosis not present

## 2017-03-04 DIAGNOSIS — Z299 Encounter for prophylactic measures, unspecified: Secondary | ICD-10-CM | POA: Diagnosis not present

## 2017-03-04 DIAGNOSIS — E785 Hyperlipidemia, unspecified: Secondary | ICD-10-CM | POA: Diagnosis not present

## 2017-03-04 DIAGNOSIS — F419 Anxiety disorder, unspecified: Secondary | ICD-10-CM | POA: Diagnosis not present

## 2017-03-04 DIAGNOSIS — J3089 Other allergic rhinitis: Secondary | ICD-10-CM | POA: Diagnosis not present

## 2017-03-04 DIAGNOSIS — M810 Age-related osteoporosis without current pathological fracture: Secondary | ICD-10-CM | POA: Diagnosis not present

## 2017-03-04 DIAGNOSIS — I10 Essential (primary) hypertension: Secondary | ICD-10-CM | POA: Diagnosis not present

## 2017-03-04 DIAGNOSIS — K210 Gastro-esophageal reflux disease with esophagitis: Secondary | ICD-10-CM | POA: Diagnosis not present

## 2017-03-08 DIAGNOSIS — J3089 Other allergic rhinitis: Secondary | ICD-10-CM | POA: Diagnosis not present

## 2017-03-10 DIAGNOSIS — H353121 Nonexudative age-related macular degeneration, left eye, early dry stage: Secondary | ICD-10-CM | POA: Diagnosis not present

## 2017-03-10 DIAGNOSIS — H43821 Vitreomacular adhesion, right eye: Secondary | ICD-10-CM | POA: Diagnosis not present

## 2017-03-10 DIAGNOSIS — H35711 Central serous chorioretinopathy, right eye: Secondary | ICD-10-CM | POA: Diagnosis not present

## 2017-03-10 DIAGNOSIS — H353111 Nonexudative age-related macular degeneration, right eye, early dry stage: Secondary | ICD-10-CM | POA: Diagnosis not present

## 2017-03-10 DIAGNOSIS — H35371 Puckering of macula, right eye: Secondary | ICD-10-CM | POA: Diagnosis not present

## 2017-03-10 DIAGNOSIS — H2511 Age-related nuclear cataract, right eye: Secondary | ICD-10-CM | POA: Diagnosis not present

## 2017-03-11 DIAGNOSIS — J3089 Other allergic rhinitis: Secondary | ICD-10-CM | POA: Diagnosis not present

## 2017-03-15 DIAGNOSIS — J3089 Other allergic rhinitis: Secondary | ICD-10-CM | POA: Diagnosis not present

## 2017-03-17 DIAGNOSIS — H35371 Puckering of macula, right eye: Secondary | ICD-10-CM | POA: Diagnosis not present

## 2017-03-17 DIAGNOSIS — H43821 Vitreomacular adhesion, right eye: Secondary | ICD-10-CM | POA: Diagnosis not present

## 2017-03-17 DIAGNOSIS — H35711 Central serous chorioretinopathy, right eye: Secondary | ICD-10-CM | POA: Diagnosis not present

## 2017-03-17 DIAGNOSIS — H353111 Nonexudative age-related macular degeneration, right eye, early dry stage: Secondary | ICD-10-CM | POA: Diagnosis not present

## 2017-03-18 DIAGNOSIS — J3089 Other allergic rhinitis: Secondary | ICD-10-CM | POA: Diagnosis not present

## 2017-03-21 ENCOUNTER — Encounter: Payer: Self-pay | Admitting: Cardiology

## 2017-03-21 ENCOUNTER — Ambulatory Visit (INDEPENDENT_AMBULATORY_CARE_PROVIDER_SITE_OTHER): Payer: Medicare Other | Admitting: Cardiology

## 2017-03-21 VITALS — BP 144/79 | HR 66 | Ht <= 58 in | Wt 137.0 lb

## 2017-03-21 DIAGNOSIS — I340 Nonrheumatic mitral (valve) insufficiency: Secondary | ICD-10-CM

## 2017-03-21 NOTE — Progress Notes (Signed)
Clinical Summary Alexa Nichols is a 71 y.o.female seen today for follow up of the following medical problems.   1. Mitral regurgitation - most recent echo 02/2016 at [facility] internal medicine: LVEF 55%, minimal MR, abnormal diastolic function. Echo 2016 MR reported as mild to moderate,  - no recent LE edema. Breathing is varaible related to her asthma.   2. Asthma - followed by Dr Woody Seller.    Past Medical History:  Diagnosis Date  . Asthma   . Chronic low back pain   . Depression   . Lobular carcinoma of left breast ([place]) 1997   in situ  . Other and unspecified hyperlipidemia   . Seasonal allergies      Allergies  Allergen Reactions  . Other Rash    DURAGESIC/ANALGESICS - gel filled per patient      Current Outpatient Prescriptions  Medication Sig Dispense Refill  . albuterol (VENTOLIN HFA) 108 (90 Base) MCG/ACT inhaler Inhale 2 puffs into the lungs every 6 (six) hours as needed for wheezing or shortness of breath.    Marland Kitchen alendronate (FOSAMAX) 70 MG tablet Take 1 tablet by mouth once a week.    . ALPRAZolam (XANAX) 0.5 MG tablet Take 0.25-5 mg by mouth daily as needed for anxiety.     Marland Kitchen azelastine (ASTELIN) 0.1 % nasal spray Place 1 spray into both nostrils 2 (two) times daily.  2  . Calcium Carb-Cholecalciferol (CALCIUM 600 + D PO) Take 1 tablet by mouth daily.    . Cholecalciferol (VITAMIN D3) 1000 UNITS CAPS Take 1 capsule by mouth daily.    . fentaNYL (DURAGESIC - DOSED MCG/HR) 25 MCG/HR patch Place 25 mcg onto the skin every 3 (three) days.     Marland Kitchen HYDROcodone-acetaminophen (NORCO/VICODIN) 5-325 MG per tablet Take 1 tablet by mouth 2 (two) times daily as needed for moderate pain.     . nitroGLYCERIN (NITROSTAT) 0.4 MG SL tablet Place 1 tablet (0.4 mg total) under the tongue every 5 (five) minutes as needed for chest pain. 25 tablet 3  . omeprazole (PRILOSEC) 20 MG capsule Take 20 mg by mouth daily.    . predniSONE (DELTASONE) 20 MG tablet Take 10-20 mg by mouth as  needed.     . sertraline (ZOLOFT) 50 MG tablet Take 1 tablet by mouth daily.    . simvastatin (ZOCOR) 20 MG tablet Take 20 mg by mouth daily.    . SYMBICORT 160-4.5 MCG/ACT inhaler Inhale 1 puff into the lungs 2 (two) times daily as needed.     Marland Kitchen tiZANidine (ZANAFLEX) 4 MG tablet Take 4 mg by mouth at bedtime as needed for muscle spasms.    Marland Kitchen zolpidem (AMBIEN) 10 MG tablet Take 5-10 mg by mouth at bedtime.      No current facility-administered medications for this visit.      Past Surgical History:  Procedure Laterality Date  . CESAREAN SECTION  1986  . CHOLECYSTECTOMY       Allergies  Allergen Reactions  . Other Rash    DURAGESIC/ANALGESICS - gel filled per patient       Family History  Problem Relation Age of Onset  . Osteoporosis Mother   . Other Father        head trauma  . Breast cancer Sister        X's 2 sisters     Social History Ms. Antrim reports that she quit smoking about 33 years ago. Her smoking use included Cigarettes. She started smoking about  52 years ago. She has a 10.00 pack-year smoking history. She has never used smokeless tobacco. Ms. Suchocki has no alcohol history on file.   Review of Systems CONSTITUTIONAL: No weight loss, fever, chills, weakness or fatigue.  HEENT: Eyes: No visual loss, blurred vision, double vision or yellow sclerae.No hearing loss, sneezing, congestion, runny nose or sore throat.  SKIN: No rash or itching.  CARDIOVASCULAR: per hpi RESPIRATORY: occasional SOB GASTROINTESTINAL: No anorexia, nausea, vomiting or diarrhea. No abdominal pain or blood.  GENITOURINARY: No burning on urination, no polyuria NEUROLOGICAL: No headache, dizziness, syncope, paralysis, ataxia, numbness or tingling in the extremities. No change in bowel or bladder control.  MUSCULOSKELETAL: No muscle, back pain, joint pain or stiffness.  LYMPHATICS: No enlarged nodes. No history of splenectomy.  PSYCHIATRIC: No history of depression or anxiety.    ENDOCRINOLOGIC: No reports of sweating, cold or heat intolerance. No polyuria or polydipsia.  Marland Kitchen   Physical Examination Vitals:   03/21/17 1252  BP: (!) 144/79  Pulse: 66   Vitals:   03/21/17 1252  Weight: 137 lb (62.1 kg)  Height: 4\' 10"  (1.473 m)    Gen: resting comfortably, no acute distress HEENT: no scleral icterus, pupils equal round and reactive, no palptable cervical adenopathy,  CV: RRR, 2/6 systolic murmur at apex, no jvd Resp: Clear to auscultation bilaterally GI: abdomen is soft, non-tender, non-distended, normal bowel sounds, no hepatosplenomegaly MSK: extremities are warm, no edema.  Skin: warm, no rash Neuro:  no focal deficits Psych: appropriate affect   Diagnostic Studies 12/31/13 Echo LVEF 62-26%, normal diastolic function, mild LAE, mild to mod MR, mild TR  01/2014 stress echo Impressions:  - Normal study after maximal exercise. Duke treadmill score is 7 consistent with low risk for major cardiac events. Very good functional capacity (140% of age and gender predicted.  Clinic EKG reviewed, LBBB  04/01/16 Clinic EKG: NSR    Assessment and Plan  1. Mitral regurgitation - serial echos followed by pcp, most recently minimal MR noted - would repeat echo in 3 years. Does not need annual echos, she will continue these studies with her pcp - f/u with [place] as needed, specifically if progression of MR to moderate or severe range.   EKG in clinic today shows SR, nonspeicific conduction delay   F/u as needed      Arnoldo Lenis, M.D.

## 2017-03-21 NOTE — Patient Instructions (Signed)
Medication Instructions:  Continue all current medications.  Labwork: none  Testing/Procedures: none  Follow-Up: As needed.    Any Other Special Instructions Will Be Listed Below (If Applicable).  If you need a refill on your cardiac medications before your next appointment, please call your pharmacy.  

## 2017-03-22 DIAGNOSIS — J3089 Other allergic rhinitis: Secondary | ICD-10-CM | POA: Diagnosis not present

## 2017-03-25 DIAGNOSIS — J3089 Other allergic rhinitis: Secondary | ICD-10-CM | POA: Diagnosis not present

## 2017-03-29 DIAGNOSIS — J3089 Other allergic rhinitis: Secondary | ICD-10-CM | POA: Diagnosis not present

## 2017-03-30 DIAGNOSIS — H35371 Puckering of macula, right eye: Secondary | ICD-10-CM | POA: Diagnosis not present

## 2017-03-30 DIAGNOSIS — H43821 Vitreomacular adhesion, right eye: Secondary | ICD-10-CM | POA: Diagnosis not present

## 2017-04-05 DIAGNOSIS — J3089 Other allergic rhinitis: Secondary | ICD-10-CM | POA: Diagnosis not present

## 2017-04-07 DIAGNOSIS — H35711 Central serous chorioretinopathy, right eye: Secondary | ICD-10-CM | POA: Diagnosis not present

## 2017-04-07 DIAGNOSIS — H43821 Vitreomacular adhesion, right eye: Secondary | ICD-10-CM | POA: Diagnosis not present

## 2017-04-07 DIAGNOSIS — Z09 Encounter for follow-up examination after completed treatment for conditions other than malignant neoplasm: Secondary | ICD-10-CM | POA: Diagnosis not present

## 2017-04-07 DIAGNOSIS — H35371 Puckering of macula, right eye: Secondary | ICD-10-CM | POA: Diagnosis not present

## 2017-04-08 DIAGNOSIS — J3089 Other allergic rhinitis: Secondary | ICD-10-CM | POA: Diagnosis not present

## 2017-04-12 DIAGNOSIS — J3089 Other allergic rhinitis: Secondary | ICD-10-CM | POA: Diagnosis not present

## 2017-04-12 DIAGNOSIS — J301 Allergic rhinitis due to pollen: Secondary | ICD-10-CM | POA: Diagnosis not present

## 2017-04-12 DIAGNOSIS — J339 Nasal polyp, unspecified: Secondary | ICD-10-CM | POA: Diagnosis not present

## 2017-04-12 DIAGNOSIS — J324 Chronic pansinusitis: Secondary | ICD-10-CM | POA: Diagnosis not present

## 2017-04-15 DIAGNOSIS — J3089 Other allergic rhinitis: Secondary | ICD-10-CM | POA: Diagnosis not present

## 2017-04-19 DIAGNOSIS — J3089 Other allergic rhinitis: Secondary | ICD-10-CM | POA: Diagnosis not present

## 2017-04-20 DIAGNOSIS — E7800 Pure hypercholesterolemia, unspecified: Secondary | ICD-10-CM | POA: Diagnosis not present

## 2017-04-20 DIAGNOSIS — I10 Essential (primary) hypertension: Secondary | ICD-10-CM | POA: Diagnosis not present

## 2017-04-21 DIAGNOSIS — Z09 Encounter for follow-up examination after completed treatment for conditions other than malignant neoplasm: Secondary | ICD-10-CM | POA: Diagnosis not present

## 2017-04-21 DIAGNOSIS — H43821 Vitreomacular adhesion, right eye: Secondary | ICD-10-CM | POA: Diagnosis not present

## 2017-04-22 DIAGNOSIS — J3089 Other allergic rhinitis: Secondary | ICD-10-CM | POA: Diagnosis not present

## 2017-04-26 DIAGNOSIS — J3089 Other allergic rhinitis: Secondary | ICD-10-CM | POA: Diagnosis not present

## 2017-04-27 DIAGNOSIS — F329 Major depressive disorder, single episode, unspecified: Secondary | ICD-10-CM | POA: Diagnosis not present

## 2017-04-27 DIAGNOSIS — F419 Anxiety disorder, unspecified: Secondary | ICD-10-CM | POA: Diagnosis not present

## 2017-04-27 DIAGNOSIS — E785 Hyperlipidemia, unspecified: Secondary | ICD-10-CM | POA: Diagnosis not present

## 2017-04-27 DIAGNOSIS — M546 Pain in thoracic spine: Secondary | ICD-10-CM | POA: Diagnosis not present

## 2017-04-27 DIAGNOSIS — M810 Age-related osteoporosis without current pathological fracture: Secondary | ICD-10-CM | POA: Diagnosis not present

## 2017-04-27 DIAGNOSIS — I10 Essential (primary) hypertension: Secondary | ICD-10-CM | POA: Diagnosis not present

## 2017-04-27 DIAGNOSIS — Z299 Encounter for prophylactic measures, unspecified: Secondary | ICD-10-CM | POA: Diagnosis not present

## 2017-04-27 DIAGNOSIS — Z713 Dietary counseling and surveillance: Secondary | ICD-10-CM | POA: Diagnosis not present

## 2017-04-27 DIAGNOSIS — Z6828 Body mass index (BMI) 28.0-28.9, adult: Secondary | ICD-10-CM | POA: Diagnosis not present

## 2017-04-29 DIAGNOSIS — J3089 Other allergic rhinitis: Secondary | ICD-10-CM | POA: Diagnosis not present

## 2017-05-03 DIAGNOSIS — J3089 Other allergic rhinitis: Secondary | ICD-10-CM | POA: Diagnosis not present

## 2017-05-06 DIAGNOSIS — J3089 Other allergic rhinitis: Secondary | ICD-10-CM | POA: Diagnosis not present

## 2017-05-10 DIAGNOSIS — J3089 Other allergic rhinitis: Secondary | ICD-10-CM | POA: Diagnosis not present

## 2017-05-12 DIAGNOSIS — H35711 Central serous chorioretinopathy, right eye: Secondary | ICD-10-CM | POA: Diagnosis not present

## 2017-05-12 DIAGNOSIS — H2511 Age-related nuclear cataract, right eye: Secondary | ICD-10-CM | POA: Diagnosis not present

## 2017-05-12 DIAGNOSIS — H353111 Nonexudative age-related macular degeneration, right eye, early dry stage: Secondary | ICD-10-CM | POA: Diagnosis not present

## 2017-05-12 DIAGNOSIS — H43821 Vitreomacular adhesion, right eye: Secondary | ICD-10-CM | POA: Diagnosis not present

## 2017-05-13 DIAGNOSIS — J3089 Other allergic rhinitis: Secondary | ICD-10-CM | POA: Diagnosis not present

## 2017-05-17 DIAGNOSIS — J3089 Other allergic rhinitis: Secondary | ICD-10-CM | POA: Diagnosis not present

## 2017-05-20 DIAGNOSIS — J3089 Other allergic rhinitis: Secondary | ICD-10-CM | POA: Diagnosis not present

## 2017-05-24 DIAGNOSIS — J3089 Other allergic rhinitis: Secondary | ICD-10-CM | POA: Diagnosis not present

## 2017-05-27 DIAGNOSIS — J3089 Other allergic rhinitis: Secondary | ICD-10-CM | POA: Diagnosis not present

## 2017-05-31 DIAGNOSIS — J3089 Other allergic rhinitis: Secondary | ICD-10-CM | POA: Diagnosis not present

## 2017-06-03 DIAGNOSIS — J3089 Other allergic rhinitis: Secondary | ICD-10-CM | POA: Diagnosis not present

## 2017-06-07 DIAGNOSIS — J3089 Other allergic rhinitis: Secondary | ICD-10-CM | POA: Diagnosis not present

## 2017-06-10 DIAGNOSIS — J3089 Other allergic rhinitis: Secondary | ICD-10-CM | POA: Diagnosis not present

## 2017-06-14 DIAGNOSIS — J3089 Other allergic rhinitis: Secondary | ICD-10-CM | POA: Diagnosis not present

## 2017-06-22 DIAGNOSIS — J3089 Other allergic rhinitis: Secondary | ICD-10-CM | POA: Diagnosis not present

## 2017-06-28 DIAGNOSIS — H2511 Age-related nuclear cataract, right eye: Secondary | ICD-10-CM | POA: Diagnosis not present

## 2017-07-01 DIAGNOSIS — J3089 Other allergic rhinitis: Secondary | ICD-10-CM | POA: Diagnosis not present

## 2017-07-04 DIAGNOSIS — H2511 Age-related nuclear cataract, right eye: Secondary | ICD-10-CM | POA: Diagnosis not present

## 2017-07-05 DIAGNOSIS — Z23 Encounter for immunization: Secondary | ICD-10-CM | POA: Diagnosis not present

## 2017-07-08 DIAGNOSIS — J3089 Other allergic rhinitis: Secondary | ICD-10-CM | POA: Diagnosis not present

## 2017-07-12 DIAGNOSIS — J3089 Other allergic rhinitis: Secondary | ICD-10-CM | POA: Diagnosis not present

## 2017-07-12 DIAGNOSIS — J324 Chronic pansinusitis: Secondary | ICD-10-CM | POA: Diagnosis not present

## 2017-07-12 DIAGNOSIS — J301 Allergic rhinitis due to pollen: Secondary | ICD-10-CM | POA: Diagnosis not present

## 2017-07-12 DIAGNOSIS — J339 Nasal polyp, unspecified: Secondary | ICD-10-CM | POA: Diagnosis not present

## 2017-07-13 DIAGNOSIS — H2512 Age-related nuclear cataract, left eye: Secondary | ICD-10-CM | POA: Diagnosis not present

## 2017-07-14 DIAGNOSIS — H35711 Central serous chorioretinopathy, right eye: Secondary | ICD-10-CM | POA: Diagnosis not present

## 2017-07-14 DIAGNOSIS — H2512 Age-related nuclear cataract, left eye: Secondary | ICD-10-CM | POA: Diagnosis not present

## 2017-07-14 DIAGNOSIS — H353111 Nonexudative age-related macular degeneration, right eye, early dry stage: Secondary | ICD-10-CM | POA: Diagnosis not present

## 2017-07-14 DIAGNOSIS — H353121 Nonexudative age-related macular degeneration, left eye, early dry stage: Secondary | ICD-10-CM | POA: Diagnosis not present

## 2017-07-18 DIAGNOSIS — H2512 Age-related nuclear cataract, left eye: Secondary | ICD-10-CM | POA: Diagnosis not present

## 2017-07-20 DIAGNOSIS — J3089 Other allergic rhinitis: Secondary | ICD-10-CM | POA: Diagnosis not present

## 2017-07-27 DIAGNOSIS — I10 Essential (primary) hypertension: Secondary | ICD-10-CM | POA: Diagnosis not present

## 2017-07-27 DIAGNOSIS — E7800 Pure hypercholesterolemia, unspecified: Secondary | ICD-10-CM | POA: Diagnosis not present

## 2017-07-28 DIAGNOSIS — J3089 Other allergic rhinitis: Secondary | ICD-10-CM | POA: Diagnosis not present

## 2017-08-04 DIAGNOSIS — J3089 Other allergic rhinitis: Secondary | ICD-10-CM | POA: Diagnosis not present

## 2017-08-18 DIAGNOSIS — J3089 Other allergic rhinitis: Secondary | ICD-10-CM | POA: Diagnosis not present

## 2017-08-30 DIAGNOSIS — E7800 Pure hypercholesterolemia, unspecified: Secondary | ICD-10-CM | POA: Diagnosis not present

## 2017-08-30 DIAGNOSIS — I10 Essential (primary) hypertension: Secondary | ICD-10-CM | POA: Diagnosis not present

## 2017-09-01 DIAGNOSIS — J3089 Other allergic rhinitis: Secondary | ICD-10-CM | POA: Diagnosis not present

## 2017-09-07 DIAGNOSIS — Z299 Encounter for prophylactic measures, unspecified: Secondary | ICD-10-CM | POA: Diagnosis not present

## 2017-09-07 DIAGNOSIS — Z789 Other specified health status: Secondary | ICD-10-CM | POA: Diagnosis not present

## 2017-09-07 DIAGNOSIS — Z6828 Body mass index (BMI) 28.0-28.9, adult: Secondary | ICD-10-CM | POA: Diagnosis not present

## 2017-09-07 DIAGNOSIS — F329 Major depressive disorder, single episode, unspecified: Secondary | ICD-10-CM | POA: Diagnosis not present

## 2017-09-07 DIAGNOSIS — J45909 Unspecified asthma, uncomplicated: Secondary | ICD-10-CM | POA: Diagnosis not present

## 2017-09-07 DIAGNOSIS — I10 Essential (primary) hypertension: Secondary | ICD-10-CM | POA: Diagnosis not present

## 2017-09-07 DIAGNOSIS — F419 Anxiety disorder, unspecified: Secondary | ICD-10-CM | POA: Diagnosis not present

## 2017-09-07 DIAGNOSIS — M546 Pain in thoracic spine: Secondary | ICD-10-CM | POA: Diagnosis not present

## 2017-09-15 DIAGNOSIS — J3089 Other allergic rhinitis: Secondary | ICD-10-CM | POA: Diagnosis not present

## 2017-09-23 DIAGNOSIS — Z1231 Encounter for screening mammogram for malignant neoplasm of breast: Secondary | ICD-10-CM | POA: Diagnosis not present

## 2017-09-29 DIAGNOSIS — J3089 Other allergic rhinitis: Secondary | ICD-10-CM | POA: Diagnosis not present

## 2017-10-06 DIAGNOSIS — I10 Essential (primary) hypertension: Secondary | ICD-10-CM | POA: Diagnosis not present

## 2017-10-06 DIAGNOSIS — E7800 Pure hypercholesterolemia, unspecified: Secondary | ICD-10-CM | POA: Diagnosis not present

## 2017-10-10 DIAGNOSIS — E7800 Pure hypercholesterolemia, unspecified: Secondary | ICD-10-CM | POA: Diagnosis not present

## 2017-10-10 DIAGNOSIS — Z1339 Encounter for screening examination for other mental health and behavioral disorders: Secondary | ICD-10-CM | POA: Diagnosis not present

## 2017-10-10 DIAGNOSIS — F419 Anxiety disorder, unspecified: Secondary | ICD-10-CM | POA: Diagnosis not present

## 2017-10-10 DIAGNOSIS — Z0000 Encounter for general adult medical examination without abnormal findings: Secondary | ICD-10-CM | POA: Diagnosis not present

## 2017-10-10 DIAGNOSIS — I10 Essential (primary) hypertension: Secondary | ICD-10-CM | POA: Diagnosis not present

## 2017-10-10 DIAGNOSIS — R05 Cough: Secondary | ICD-10-CM | POA: Diagnosis not present

## 2017-10-10 DIAGNOSIS — Z79899 Other long term (current) drug therapy: Secondary | ICD-10-CM | POA: Diagnosis not present

## 2017-10-10 DIAGNOSIS — Z299 Encounter for prophylactic measures, unspecified: Secondary | ICD-10-CM | POA: Diagnosis not present

## 2017-10-10 DIAGNOSIS — R5383 Other fatigue: Secondary | ICD-10-CM | POA: Diagnosis not present

## 2017-10-10 DIAGNOSIS — G4700 Insomnia, unspecified: Secondary | ICD-10-CM | POA: Diagnosis not present

## 2017-10-10 DIAGNOSIS — Z1211 Encounter for screening for malignant neoplasm of colon: Secondary | ICD-10-CM | POA: Diagnosis not present

## 2017-10-10 DIAGNOSIS — Z1331 Encounter for screening for depression: Secondary | ICD-10-CM | POA: Diagnosis not present

## 2017-10-10 DIAGNOSIS — Z6829 Body mass index (BMI) 29.0-29.9, adult: Secondary | ICD-10-CM | POA: Diagnosis not present

## 2017-10-10 DIAGNOSIS — Z7189 Other specified counseling: Secondary | ICD-10-CM | POA: Diagnosis not present

## 2017-10-14 DIAGNOSIS — J3089 Other allergic rhinitis: Secondary | ICD-10-CM | POA: Diagnosis not present

## 2017-10-28 DIAGNOSIS — J3089 Other allergic rhinitis: Secondary | ICD-10-CM | POA: Diagnosis not present

## 2017-11-02 DIAGNOSIS — J209 Acute bronchitis, unspecified: Secondary | ICD-10-CM | POA: Diagnosis not present

## 2017-11-02 DIAGNOSIS — Z6829 Body mass index (BMI) 29.0-29.9, adult: Secondary | ICD-10-CM | POA: Diagnosis not present

## 2017-11-02 DIAGNOSIS — Z299 Encounter for prophylactic measures, unspecified: Secondary | ICD-10-CM | POA: Diagnosis not present

## 2017-11-02 DIAGNOSIS — Z713 Dietary counseling and surveillance: Secondary | ICD-10-CM | POA: Diagnosis not present

## 2017-11-02 DIAGNOSIS — R6889 Other general symptoms and signs: Secondary | ICD-10-CM | POA: Diagnosis not present

## 2017-11-11 DIAGNOSIS — J3089 Other allergic rhinitis: Secondary | ICD-10-CM | POA: Diagnosis not present

## 2017-11-17 DIAGNOSIS — B49 Unspecified mycosis: Secondary | ICD-10-CM | POA: Diagnosis not present

## 2017-11-17 DIAGNOSIS — J329 Chronic sinusitis, unspecified: Secondary | ICD-10-CM | POA: Diagnosis not present

## 2017-11-17 DIAGNOSIS — J301 Allergic rhinitis due to pollen: Secondary | ICD-10-CM | POA: Diagnosis not present

## 2017-11-17 DIAGNOSIS — J324 Chronic pansinusitis: Secondary | ICD-10-CM | POA: Diagnosis not present

## 2017-11-18 DIAGNOSIS — K529 Noninfective gastroenteritis and colitis, unspecified: Secondary | ICD-10-CM | POA: Diagnosis not present

## 2017-11-18 DIAGNOSIS — E785 Hyperlipidemia, unspecified: Secondary | ICD-10-CM | POA: Diagnosis not present

## 2017-11-18 DIAGNOSIS — Z6829 Body mass index (BMI) 29.0-29.9, adult: Secondary | ICD-10-CM | POA: Diagnosis not present

## 2017-11-18 DIAGNOSIS — Z87891 Personal history of nicotine dependence: Secondary | ICD-10-CM | POA: Diagnosis not present

## 2017-11-18 DIAGNOSIS — Z299 Encounter for prophylactic measures, unspecified: Secondary | ICD-10-CM | POA: Diagnosis not present

## 2017-11-24 DIAGNOSIS — H353111 Nonexudative age-related macular degeneration, right eye, early dry stage: Secondary | ICD-10-CM | POA: Diagnosis not present

## 2017-11-24 DIAGNOSIS — H353121 Nonexudative age-related macular degeneration, left eye, early dry stage: Secondary | ICD-10-CM | POA: Diagnosis not present

## 2017-11-24 DIAGNOSIS — H2512 Age-related nuclear cataract, left eye: Secondary | ICD-10-CM | POA: Diagnosis not present

## 2017-11-24 DIAGNOSIS — H35711 Central serous chorioretinopathy, right eye: Secondary | ICD-10-CM | POA: Diagnosis not present

## 2017-11-24 DIAGNOSIS — H35361 Drusen (degenerative) of macula, right eye: Secondary | ICD-10-CM | POA: Diagnosis not present

## 2017-11-28 DIAGNOSIS — I10 Essential (primary) hypertension: Secondary | ICD-10-CM | POA: Diagnosis not present

## 2017-11-28 DIAGNOSIS — E7800 Pure hypercholesterolemia, unspecified: Secondary | ICD-10-CM | POA: Diagnosis not present

## 2017-11-30 DIAGNOSIS — H26493 Other secondary cataract, bilateral: Secondary | ICD-10-CM | POA: Diagnosis not present

## 2017-11-30 DIAGNOSIS — H20012 Primary iridocyclitis, left eye: Secondary | ICD-10-CM | POA: Diagnosis not present

## 2017-11-30 DIAGNOSIS — Z961 Presence of intraocular lens: Secondary | ICD-10-CM | POA: Diagnosis not present

## 2017-12-02 DIAGNOSIS — J3089 Other allergic rhinitis: Secondary | ICD-10-CM | POA: Diagnosis not present

## 2017-12-12 DIAGNOSIS — M546 Pain in thoracic spine: Secondary | ICD-10-CM | POA: Diagnosis not present

## 2017-12-12 DIAGNOSIS — Z6829 Body mass index (BMI) 29.0-29.9, adult: Secondary | ICD-10-CM | POA: Diagnosis not present

## 2017-12-12 DIAGNOSIS — Z299 Encounter for prophylactic measures, unspecified: Secondary | ICD-10-CM | POA: Diagnosis not present

## 2017-12-12 DIAGNOSIS — R05 Cough: Secondary | ICD-10-CM | POA: Diagnosis not present

## 2017-12-12 DIAGNOSIS — E785 Hyperlipidemia, unspecified: Secondary | ICD-10-CM | POA: Diagnosis not present

## 2017-12-12 DIAGNOSIS — I10 Essential (primary) hypertension: Secondary | ICD-10-CM | POA: Diagnosis not present

## 2017-12-12 DIAGNOSIS — F419 Anxiety disorder, unspecified: Secondary | ICD-10-CM | POA: Diagnosis not present

## 2017-12-13 DIAGNOSIS — E7800 Pure hypercholesterolemia, unspecified: Secondary | ICD-10-CM | POA: Diagnosis not present

## 2017-12-13 DIAGNOSIS — I10 Essential (primary) hypertension: Secondary | ICD-10-CM | POA: Diagnosis not present

## 2017-12-23 DIAGNOSIS — J3089 Other allergic rhinitis: Secondary | ICD-10-CM | POA: Diagnosis not present

## 2018-01-10 DIAGNOSIS — S8391XA Sprain of unspecified site of right knee, initial encounter: Secondary | ICD-10-CM | POA: Diagnosis not present

## 2018-01-10 DIAGNOSIS — X500XXA Overexertion from strenuous movement or load, initial encounter: Secondary | ICD-10-CM | POA: Diagnosis not present

## 2018-01-10 DIAGNOSIS — M25561 Pain in right knee: Secondary | ICD-10-CM | POA: Diagnosis not present

## 2018-01-10 DIAGNOSIS — S8991XA Unspecified injury of right lower leg, initial encounter: Secondary | ICD-10-CM | POA: Diagnosis not present

## 2018-01-10 DIAGNOSIS — F17200 Nicotine dependence, unspecified, uncomplicated: Secondary | ICD-10-CM | POA: Diagnosis not present

## 2018-01-10 DIAGNOSIS — Z79899 Other long term (current) drug therapy: Secondary | ICD-10-CM | POA: Diagnosis not present

## 2018-01-10 DIAGNOSIS — M1990 Unspecified osteoarthritis, unspecified site: Secondary | ICD-10-CM | POA: Diagnosis not present

## 2018-01-10 DIAGNOSIS — J45909 Unspecified asthma, uncomplicated: Secondary | ICD-10-CM | POA: Diagnosis not present

## 2018-01-10 DIAGNOSIS — K219 Gastro-esophageal reflux disease without esophagitis: Secondary | ICD-10-CM | POA: Diagnosis not present

## 2018-01-10 DIAGNOSIS — Z7982 Long term (current) use of aspirin: Secondary | ICD-10-CM | POA: Diagnosis not present

## 2018-01-10 DIAGNOSIS — M810 Age-related osteoporosis without current pathological fracture: Secondary | ICD-10-CM | POA: Diagnosis not present

## 2018-01-10 DIAGNOSIS — Z853 Personal history of malignant neoplasm of breast: Secondary | ICD-10-CM | POA: Diagnosis not present

## 2018-01-20 DIAGNOSIS — I10 Essential (primary) hypertension: Secondary | ICD-10-CM | POA: Diagnosis not present

## 2018-01-20 DIAGNOSIS — J3089 Other allergic rhinitis: Secondary | ICD-10-CM | POA: Diagnosis not present

## 2018-01-20 DIAGNOSIS — E7800 Pure hypercholesterolemia, unspecified: Secondary | ICD-10-CM | POA: Diagnosis not present

## 2018-02-08 DIAGNOSIS — H20012 Primary iridocyclitis, left eye: Secondary | ICD-10-CM | POA: Diagnosis not present

## 2018-02-10 DIAGNOSIS — J3089 Other allergic rhinitis: Secondary | ICD-10-CM | POA: Diagnosis not present

## 2018-02-20 DIAGNOSIS — L570 Actinic keratosis: Secondary | ICD-10-CM | POA: Diagnosis not present

## 2018-02-20 DIAGNOSIS — L280 Lichen simplex chronicus: Secondary | ICD-10-CM | POA: Diagnosis not present

## 2018-02-20 DIAGNOSIS — L821 Other seborrheic keratosis: Secondary | ICD-10-CM | POA: Diagnosis not present

## 2018-02-20 DIAGNOSIS — D1800 Hemangioma unspecified site: Secondary | ICD-10-CM | POA: Diagnosis not present

## 2018-02-20 DIAGNOSIS — L728 Other follicular cysts of the skin and subcutaneous tissue: Secondary | ICD-10-CM | POA: Diagnosis not present

## 2018-02-28 DIAGNOSIS — Z87891 Personal history of nicotine dependence: Secondary | ICD-10-CM | POA: Diagnosis not present

## 2018-02-28 DIAGNOSIS — H6690 Otitis media, unspecified, unspecified ear: Secondary | ICD-10-CM | POA: Diagnosis not present

## 2018-02-28 DIAGNOSIS — J069 Acute upper respiratory infection, unspecified: Secondary | ICD-10-CM | POA: Diagnosis not present

## 2018-02-28 DIAGNOSIS — I10 Essential (primary) hypertension: Secondary | ICD-10-CM | POA: Diagnosis not present

## 2018-02-28 DIAGNOSIS — Z6830 Body mass index (BMI) 30.0-30.9, adult: Secondary | ICD-10-CM | POA: Diagnosis not present

## 2018-02-28 DIAGNOSIS — Z299 Encounter for prophylactic measures, unspecified: Secondary | ICD-10-CM | POA: Diagnosis not present

## 2018-03-02 DIAGNOSIS — I10 Essential (primary) hypertension: Secondary | ICD-10-CM | POA: Diagnosis not present

## 2018-03-02 DIAGNOSIS — E7800 Pure hypercholesterolemia, unspecified: Secondary | ICD-10-CM | POA: Diagnosis not present

## 2018-03-03 DIAGNOSIS — J3089 Other allergic rhinitis: Secondary | ICD-10-CM | POA: Diagnosis not present

## 2018-03-24 DIAGNOSIS — J3089 Other allergic rhinitis: Secondary | ICD-10-CM | POA: Diagnosis not present

## 2018-03-29 DIAGNOSIS — R6884 Jaw pain: Secondary | ICD-10-CM | POA: Diagnosis not present

## 2018-03-29 DIAGNOSIS — Z299 Encounter for prophylactic measures, unspecified: Secondary | ICD-10-CM | POA: Diagnosis not present

## 2018-03-29 DIAGNOSIS — F419 Anxiety disorder, unspecified: Secondary | ICD-10-CM | POA: Diagnosis not present

## 2018-03-29 DIAGNOSIS — Z713 Dietary counseling and surveillance: Secondary | ICD-10-CM | POA: Diagnosis not present

## 2018-03-29 DIAGNOSIS — E785 Hyperlipidemia, unspecified: Secondary | ICD-10-CM | POA: Diagnosis not present

## 2018-04-21 DIAGNOSIS — J3089 Other allergic rhinitis: Secondary | ICD-10-CM | POA: Diagnosis not present

## 2018-05-09 DIAGNOSIS — Z713 Dietary counseling and surveillance: Secondary | ICD-10-CM | POA: Diagnosis not present

## 2018-05-09 DIAGNOSIS — I10 Essential (primary) hypertension: Secondary | ICD-10-CM | POA: Diagnosis not present

## 2018-05-09 DIAGNOSIS — J069 Acute upper respiratory infection, unspecified: Secondary | ICD-10-CM | POA: Diagnosis not present

## 2018-05-09 DIAGNOSIS — Z6829 Body mass index (BMI) 29.0-29.9, adult: Secondary | ICD-10-CM | POA: Diagnosis not present

## 2018-05-09 DIAGNOSIS — Z87891 Personal history of nicotine dependence: Secondary | ICD-10-CM | POA: Diagnosis not present

## 2018-05-09 DIAGNOSIS — Z299 Encounter for prophylactic measures, unspecified: Secondary | ICD-10-CM | POA: Diagnosis not present

## 2018-05-18 DIAGNOSIS — J324 Chronic pansinusitis: Secondary | ICD-10-CM | POA: Diagnosis not present

## 2018-05-18 DIAGNOSIS — J301 Allergic rhinitis due to pollen: Secondary | ICD-10-CM | POA: Diagnosis not present

## 2018-05-18 DIAGNOSIS — B49 Unspecified mycosis: Secondary | ICD-10-CM | POA: Diagnosis not present

## 2018-05-18 DIAGNOSIS — J3089 Other allergic rhinitis: Secondary | ICD-10-CM | POA: Diagnosis not present

## 2018-05-24 DIAGNOSIS — I10 Essential (primary) hypertension: Secondary | ICD-10-CM | POA: Diagnosis not present

## 2018-05-24 DIAGNOSIS — E7800 Pure hypercholesterolemia, unspecified: Secondary | ICD-10-CM | POA: Diagnosis not present

## 2018-06-15 DIAGNOSIS — J3089 Other allergic rhinitis: Secondary | ICD-10-CM | POA: Diagnosis not present

## 2018-06-26 DIAGNOSIS — Z23 Encounter for immunization: Secondary | ICD-10-CM | POA: Diagnosis not present

## 2018-06-28 DIAGNOSIS — I10 Essential (primary) hypertension: Secondary | ICD-10-CM | POA: Diagnosis not present

## 2018-06-28 DIAGNOSIS — E7800 Pure hypercholesterolemia, unspecified: Secondary | ICD-10-CM | POA: Diagnosis not present

## 2018-07-14 DIAGNOSIS — J3089 Other allergic rhinitis: Secondary | ICD-10-CM | POA: Diagnosis not present

## 2018-07-28 DIAGNOSIS — E7800 Pure hypercholesterolemia, unspecified: Secondary | ICD-10-CM | POA: Diagnosis not present

## 2018-07-28 DIAGNOSIS — I10 Essential (primary) hypertension: Secondary | ICD-10-CM | POA: Diagnosis not present

## 2018-08-08 DIAGNOSIS — Z299 Encounter for prophylactic measures, unspecified: Secondary | ICD-10-CM | POA: Diagnosis not present

## 2018-08-08 DIAGNOSIS — Z6830 Body mass index (BMI) 30.0-30.9, adult: Secondary | ICD-10-CM | POA: Diagnosis not present

## 2018-08-08 DIAGNOSIS — G4700 Insomnia, unspecified: Secondary | ICD-10-CM | POA: Diagnosis not present

## 2018-08-08 DIAGNOSIS — I10 Essential (primary) hypertension: Secondary | ICD-10-CM | POA: Diagnosis not present

## 2018-08-08 DIAGNOSIS — M546 Pain in thoracic spine: Secondary | ICD-10-CM | POA: Diagnosis not present

## 2018-08-08 DIAGNOSIS — I700 Atherosclerosis of aorta: Secondary | ICD-10-CM | POA: Diagnosis not present

## 2018-08-08 DIAGNOSIS — R05 Cough: Secondary | ICD-10-CM | POA: Diagnosis not present

## 2018-08-08 DIAGNOSIS — J449 Chronic obstructive pulmonary disease, unspecified: Secondary | ICD-10-CM | POA: Diagnosis not present

## 2018-08-11 DIAGNOSIS — J3089 Other allergic rhinitis: Secondary | ICD-10-CM | POA: Diagnosis not present

## 2018-09-04 DIAGNOSIS — I10 Essential (primary) hypertension: Secondary | ICD-10-CM | POA: Diagnosis not present

## 2018-09-04 DIAGNOSIS — E7800 Pure hypercholesterolemia, unspecified: Secondary | ICD-10-CM | POA: Diagnosis not present

## 2018-09-08 DIAGNOSIS — J3089 Other allergic rhinitis: Secondary | ICD-10-CM | POA: Diagnosis not present

## 2018-09-25 DIAGNOSIS — Z299 Encounter for prophylactic measures, unspecified: Secondary | ICD-10-CM | POA: Diagnosis not present

## 2018-09-25 DIAGNOSIS — I10 Essential (primary) hypertension: Secondary | ICD-10-CM | POA: Diagnosis not present

## 2018-09-25 DIAGNOSIS — Z6829 Body mass index (BMI) 29.0-29.9, adult: Secondary | ICD-10-CM | POA: Diagnosis not present

## 2018-09-25 DIAGNOSIS — J069 Acute upper respiratory infection, unspecified: Secondary | ICD-10-CM | POA: Diagnosis not present

## 2018-09-29 DIAGNOSIS — Z1231 Encounter for screening mammogram for malignant neoplasm of breast: Secondary | ICD-10-CM | POA: Diagnosis not present

## 2018-10-05 DIAGNOSIS — E7800 Pure hypercholesterolemia, unspecified: Secondary | ICD-10-CM | POA: Diagnosis not present

## 2018-10-05 DIAGNOSIS — I10 Essential (primary) hypertension: Secondary | ICD-10-CM | POA: Diagnosis not present

## 2018-10-06 DIAGNOSIS — J3089 Other allergic rhinitis: Secondary | ICD-10-CM | POA: Diagnosis not present

## 2018-10-17 DIAGNOSIS — Z299 Encounter for prophylactic measures, unspecified: Secondary | ICD-10-CM | POA: Diagnosis not present

## 2018-10-17 DIAGNOSIS — E7800 Pure hypercholesterolemia, unspecified: Secondary | ICD-10-CM | POA: Diagnosis not present

## 2018-10-17 DIAGNOSIS — Z1339 Encounter for screening examination for other mental health and behavioral disorders: Secondary | ICD-10-CM | POA: Diagnosis not present

## 2018-10-17 DIAGNOSIS — G4700 Insomnia, unspecified: Secondary | ICD-10-CM | POA: Diagnosis not present

## 2018-10-17 DIAGNOSIS — M545 Low back pain: Secondary | ICD-10-CM | POA: Diagnosis not present

## 2018-10-17 DIAGNOSIS — Z79899 Other long term (current) drug therapy: Secondary | ICD-10-CM | POA: Diagnosis not present

## 2018-10-17 DIAGNOSIS — Z1211 Encounter for screening for malignant neoplasm of colon: Secondary | ICD-10-CM | POA: Diagnosis not present

## 2018-10-17 DIAGNOSIS — Z7189 Other specified counseling: Secondary | ICD-10-CM | POA: Diagnosis not present

## 2018-10-17 DIAGNOSIS — Z6829 Body mass index (BMI) 29.0-29.9, adult: Secondary | ICD-10-CM | POA: Diagnosis not present

## 2018-10-17 DIAGNOSIS — I10 Essential (primary) hypertension: Secondary | ICD-10-CM | POA: Diagnosis not present

## 2018-10-17 DIAGNOSIS — Z1331 Encounter for screening for depression: Secondary | ICD-10-CM | POA: Diagnosis not present

## 2018-10-17 DIAGNOSIS — Z0000 Encounter for general adult medical examination without abnormal findings: Secondary | ICD-10-CM | POA: Diagnosis not present

## 2018-10-17 DIAGNOSIS — R5383 Other fatigue: Secondary | ICD-10-CM | POA: Diagnosis not present

## 2018-11-03 DIAGNOSIS — J3089 Other allergic rhinitis: Secondary | ICD-10-CM | POA: Diagnosis not present

## 2018-11-16 DIAGNOSIS — E7800 Pure hypercholesterolemia, unspecified: Secondary | ICD-10-CM | POA: Diagnosis not present

## 2018-11-16 DIAGNOSIS — I10 Essential (primary) hypertension: Secondary | ICD-10-CM | POA: Diagnosis not present

## 2018-11-17 DIAGNOSIS — J329 Chronic sinusitis, unspecified: Secondary | ICD-10-CM | POA: Diagnosis not present

## 2018-11-17 DIAGNOSIS — J309 Allergic rhinitis, unspecified: Secondary | ICD-10-CM | POA: Diagnosis not present

## 2018-11-17 DIAGNOSIS — R05 Cough: Secondary | ICD-10-CM | POA: Diagnosis not present

## 2018-11-17 DIAGNOSIS — B49 Unspecified mycosis: Secondary | ICD-10-CM | POA: Diagnosis not present

## 2018-11-30 DIAGNOSIS — H35361 Drusen (degenerative) of macula, right eye: Secondary | ICD-10-CM | POA: Diagnosis not present

## 2018-11-30 DIAGNOSIS — H353121 Nonexudative age-related macular degeneration, left eye, early dry stage: Secondary | ICD-10-CM | POA: Diagnosis not present

## 2018-11-30 DIAGNOSIS — H353111 Nonexudative age-related macular degeneration, right eye, early dry stage: Secondary | ICD-10-CM | POA: Diagnosis not present

## 2018-11-30 DIAGNOSIS — H35711 Central serous chorioretinopathy, right eye: Secondary | ICD-10-CM | POA: Diagnosis not present

## 2018-12-01 DIAGNOSIS — J3089 Other allergic rhinitis: Secondary | ICD-10-CM | POA: Diagnosis not present

## 2018-12-05 DIAGNOSIS — B300 Keratoconjunctivitis due to adenovirus: Secondary | ICD-10-CM | POA: Diagnosis not present

## 2018-12-25 DIAGNOSIS — Z87891 Personal history of nicotine dependence: Secondary | ICD-10-CM | POA: Diagnosis not present

## 2018-12-25 DIAGNOSIS — R0602 Shortness of breath: Secondary | ICD-10-CM | POA: Diagnosis not present

## 2018-12-25 DIAGNOSIS — R0989 Other specified symptoms and signs involving the circulatory and respiratory systems: Secondary | ICD-10-CM | POA: Diagnosis not present

## 2018-12-25 DIAGNOSIS — J45990 Exercise induced bronchospasm: Secondary | ICD-10-CM | POA: Diagnosis not present

## 2019-01-03 DIAGNOSIS — J3089 Other allergic rhinitis: Secondary | ICD-10-CM | POA: Diagnosis not present

## 2019-01-11 DIAGNOSIS — I10 Essential (primary) hypertension: Secondary | ICD-10-CM | POA: Diagnosis not present

## 2019-01-11 DIAGNOSIS — M545 Low back pain: Secondary | ICD-10-CM | POA: Diagnosis not present

## 2019-01-11 DIAGNOSIS — K219 Gastro-esophageal reflux disease without esophagitis: Secondary | ICD-10-CM | POA: Diagnosis not present

## 2019-01-11 DIAGNOSIS — Z299 Encounter for prophylactic measures, unspecified: Secondary | ICD-10-CM | POA: Diagnosis not present

## 2019-01-11 DIAGNOSIS — G4700 Insomnia, unspecified: Secondary | ICD-10-CM | POA: Diagnosis not present

## 2019-01-11 DIAGNOSIS — Z79899 Other long term (current) drug therapy: Secondary | ICD-10-CM | POA: Diagnosis not present

## 2019-01-16 DIAGNOSIS — E7800 Pure hypercholesterolemia, unspecified: Secondary | ICD-10-CM | POA: Diagnosis not present

## 2019-01-16 DIAGNOSIS — I10 Essential (primary) hypertension: Secondary | ICD-10-CM | POA: Diagnosis not present

## 2019-01-31 DIAGNOSIS — J3089 Other allergic rhinitis: Secondary | ICD-10-CM | POA: Diagnosis not present

## 2019-02-09 DIAGNOSIS — E7800 Pure hypercholesterolemia, unspecified: Secondary | ICD-10-CM | POA: Diagnosis not present

## 2019-02-09 DIAGNOSIS — I10 Essential (primary) hypertension: Secondary | ICD-10-CM | POA: Diagnosis not present

## 2019-02-14 DIAGNOSIS — H538 Other visual disturbances: Secondary | ICD-10-CM | POA: Diagnosis not present

## 2019-02-14 DIAGNOSIS — H35371 Puckering of macula, right eye: Secondary | ICD-10-CM | POA: Diagnosis not present

## 2019-02-14 DIAGNOSIS — H353111 Nonexudative age-related macular degeneration, right eye, early dry stage: Secondary | ICD-10-CM | POA: Diagnosis not present

## 2019-02-14 DIAGNOSIS — Z961 Presence of intraocular lens: Secondary | ICD-10-CM | POA: Diagnosis not present

## 2019-02-21 DIAGNOSIS — L570 Actinic keratosis: Secondary | ICD-10-CM | POA: Diagnosis not present

## 2019-02-21 DIAGNOSIS — L259 Unspecified contact dermatitis, unspecified cause: Secondary | ICD-10-CM | POA: Diagnosis not present

## 2019-03-01 DIAGNOSIS — M545 Low back pain: Secondary | ICD-10-CM | POA: Diagnosis not present

## 2019-03-01 DIAGNOSIS — Z79899 Other long term (current) drug therapy: Secondary | ICD-10-CM | POA: Diagnosis not present

## 2019-03-01 DIAGNOSIS — Z299 Encounter for prophylactic measures, unspecified: Secondary | ICD-10-CM | POA: Diagnosis not present

## 2019-03-01 DIAGNOSIS — I10 Essential (primary) hypertension: Secondary | ICD-10-CM | POA: Diagnosis not present

## 2019-03-02 DIAGNOSIS — J3089 Other allergic rhinitis: Secondary | ICD-10-CM | POA: Diagnosis not present

## 2019-03-14 DIAGNOSIS — Z01818 Encounter for other preprocedural examination: Secondary | ICD-10-CM | POA: Diagnosis not present

## 2019-03-14 DIAGNOSIS — Z1159 Encounter for screening for other viral diseases: Secondary | ICD-10-CM | POA: Diagnosis not present

## 2019-03-19 DIAGNOSIS — R0602 Shortness of breath: Secondary | ICD-10-CM | POA: Diagnosis not present

## 2019-03-19 DIAGNOSIS — J449 Chronic obstructive pulmonary disease, unspecified: Secondary | ICD-10-CM | POA: Diagnosis not present

## 2019-03-19 DIAGNOSIS — J339 Nasal polyp, unspecified: Secondary | ICD-10-CM | POA: Diagnosis not present

## 2019-03-19 DIAGNOSIS — J3089 Other allergic rhinitis: Secondary | ICD-10-CM | POA: Diagnosis not present

## 2019-03-21 DIAGNOSIS — I10 Essential (primary) hypertension: Secondary | ICD-10-CM | POA: Diagnosis not present

## 2019-03-21 DIAGNOSIS — E7800 Pure hypercholesterolemia, unspecified: Secondary | ICD-10-CM | POA: Diagnosis not present

## 2019-03-28 DIAGNOSIS — J3089 Other allergic rhinitis: Secondary | ICD-10-CM | POA: Diagnosis not present

## 2019-04-17 DIAGNOSIS — E785 Hyperlipidemia, unspecified: Secondary | ICD-10-CM | POA: Diagnosis not present

## 2019-04-17 DIAGNOSIS — I10 Essential (primary) hypertension: Secondary | ICD-10-CM | POA: Diagnosis not present

## 2019-04-17 DIAGNOSIS — Z299 Encounter for prophylactic measures, unspecified: Secondary | ICD-10-CM | POA: Diagnosis not present

## 2019-04-17 DIAGNOSIS — M546 Pain in thoracic spine: Secondary | ICD-10-CM | POA: Diagnosis not present

## 2019-04-17 DIAGNOSIS — Z6829 Body mass index (BMI) 29.0-29.9, adult: Secondary | ICD-10-CM | POA: Diagnosis not present

## 2019-04-19 DIAGNOSIS — Z87891 Personal history of nicotine dependence: Secondary | ICD-10-CM | POA: Diagnosis not present

## 2019-04-19 DIAGNOSIS — J449 Chronic obstructive pulmonary disease, unspecified: Secondary | ICD-10-CM | POA: Diagnosis not present

## 2019-04-19 DIAGNOSIS — J3089 Other allergic rhinitis: Secondary | ICD-10-CM | POA: Diagnosis not present

## 2019-04-19 DIAGNOSIS — R942 Abnormal results of pulmonary function studies: Secondary | ICD-10-CM | POA: Diagnosis not present

## 2019-04-27 DIAGNOSIS — J3089 Other allergic rhinitis: Secondary | ICD-10-CM | POA: Diagnosis not present

## 2019-05-25 DIAGNOSIS — J3089 Other allergic rhinitis: Secondary | ICD-10-CM | POA: Diagnosis not present

## 2019-05-31 DIAGNOSIS — E7800 Pure hypercholesterolemia, unspecified: Secondary | ICD-10-CM | POA: Diagnosis not present

## 2019-05-31 DIAGNOSIS — I10 Essential (primary) hypertension: Secondary | ICD-10-CM | POA: Diagnosis not present

## 2019-06-14 DIAGNOSIS — Z23 Encounter for immunization: Secondary | ICD-10-CM | POA: Diagnosis not present

## 2019-06-20 DIAGNOSIS — J3089 Other allergic rhinitis: Secondary | ICD-10-CM | POA: Diagnosis not present

## 2019-07-02 DIAGNOSIS — E7800 Pure hypercholesterolemia, unspecified: Secondary | ICD-10-CM | POA: Diagnosis not present

## 2019-07-02 DIAGNOSIS — I10 Essential (primary) hypertension: Secondary | ICD-10-CM | POA: Diagnosis not present

## 2019-07-19 DIAGNOSIS — J3089 Other allergic rhinitis: Secondary | ICD-10-CM | POA: Diagnosis not present

## 2019-08-07 DIAGNOSIS — Z299 Encounter for prophylactic measures, unspecified: Secondary | ICD-10-CM | POA: Diagnosis not present

## 2019-08-07 DIAGNOSIS — Z87891 Personal history of nicotine dependence: Secondary | ICD-10-CM | POA: Diagnosis not present

## 2019-08-07 DIAGNOSIS — I10 Essential (primary) hypertension: Secondary | ICD-10-CM | POA: Diagnosis not present

## 2019-08-07 DIAGNOSIS — J209 Acute bronchitis, unspecified: Secondary | ICD-10-CM | POA: Diagnosis not present

## 2019-08-07 DIAGNOSIS — Z6829 Body mass index (BMI) 29.0-29.9, adult: Secondary | ICD-10-CM | POA: Diagnosis not present

## 2019-08-07 DIAGNOSIS — G4700 Insomnia, unspecified: Secondary | ICD-10-CM | POA: Diagnosis not present

## 2019-08-07 DIAGNOSIS — R05 Cough: Secondary | ICD-10-CM | POA: Diagnosis not present

## 2019-08-15 DIAGNOSIS — J3089 Other allergic rhinitis: Secondary | ICD-10-CM | POA: Diagnosis not present

## 2019-09-12 DIAGNOSIS — I10 Essential (primary) hypertension: Secondary | ICD-10-CM | POA: Diagnosis not present

## 2019-09-12 DIAGNOSIS — E7800 Pure hypercholesterolemia, unspecified: Secondary | ICD-10-CM | POA: Diagnosis not present

## 2019-10-18 DIAGNOSIS — J3089 Other allergic rhinitis: Secondary | ICD-10-CM | POA: Diagnosis not present

## 2019-10-18 DIAGNOSIS — J449 Chronic obstructive pulmonary disease, unspecified: Secondary | ICD-10-CM | POA: Diagnosis not present

## 2019-10-18 DIAGNOSIS — Z87891 Personal history of nicotine dependence: Secondary | ICD-10-CM | POA: Diagnosis not present

## 2019-10-18 DIAGNOSIS — R942 Abnormal results of pulmonary function studies: Secondary | ICD-10-CM | POA: Diagnosis not present

## 2019-10-19 DIAGNOSIS — E7800 Pure hypercholesterolemia, unspecified: Secondary | ICD-10-CM | POA: Diagnosis not present

## 2019-10-19 DIAGNOSIS — I10 Essential (primary) hypertension: Secondary | ICD-10-CM | POA: Diagnosis not present

## 2019-10-22 DIAGNOSIS — Z6831 Body mass index (BMI) 31.0-31.9, adult: Secondary | ICD-10-CM | POA: Diagnosis not present

## 2019-10-22 DIAGNOSIS — Z79899 Other long term (current) drug therapy: Secondary | ICD-10-CM | POA: Diagnosis not present

## 2019-10-22 DIAGNOSIS — Z299 Encounter for prophylactic measures, unspecified: Secondary | ICD-10-CM | POA: Diagnosis not present

## 2019-10-22 DIAGNOSIS — I10 Essential (primary) hypertension: Secondary | ICD-10-CM | POA: Diagnosis not present

## 2019-10-22 DIAGNOSIS — Z1211 Encounter for screening for malignant neoplasm of colon: Secondary | ICD-10-CM | POA: Diagnosis not present

## 2019-10-22 DIAGNOSIS — R5383 Other fatigue: Secondary | ICD-10-CM | POA: Diagnosis not present

## 2019-10-22 DIAGNOSIS — Z1331 Encounter for screening for depression: Secondary | ICD-10-CM | POA: Diagnosis not present

## 2019-10-22 DIAGNOSIS — Z0000 Encounter for general adult medical examination without abnormal findings: Secondary | ICD-10-CM | POA: Diagnosis not present

## 2019-10-22 DIAGNOSIS — F419 Anxiety disorder, unspecified: Secondary | ICD-10-CM | POA: Diagnosis not present

## 2019-10-22 DIAGNOSIS — E785 Hyperlipidemia, unspecified: Secondary | ICD-10-CM | POA: Diagnosis not present

## 2019-10-22 DIAGNOSIS — Z1339 Encounter for screening examination for other mental health and behavioral disorders: Secondary | ICD-10-CM | POA: Diagnosis not present

## 2019-10-22 DIAGNOSIS — Z7189 Other specified counseling: Secondary | ICD-10-CM | POA: Diagnosis not present

## 2019-10-22 DIAGNOSIS — G4700 Insomnia, unspecified: Secondary | ICD-10-CM | POA: Diagnosis not present

## 2019-11-07 DIAGNOSIS — J3089 Other allergic rhinitis: Secondary | ICD-10-CM | POA: Diagnosis not present

## 2019-11-09 DIAGNOSIS — I10 Essential (primary) hypertension: Secondary | ICD-10-CM | POA: Diagnosis not present

## 2019-11-09 DIAGNOSIS — Z299 Encounter for prophylactic measures, unspecified: Secondary | ICD-10-CM | POA: Diagnosis not present

## 2019-11-09 DIAGNOSIS — Z6830 Body mass index (BMI) 30.0-30.9, adult: Secondary | ICD-10-CM | POA: Diagnosis not present

## 2019-11-09 DIAGNOSIS — M47814 Spondylosis without myelopathy or radiculopathy, thoracic region: Secondary | ICD-10-CM | POA: Diagnosis not present

## 2019-11-09 DIAGNOSIS — Z87891 Personal history of nicotine dependence: Secondary | ICD-10-CM | POA: Diagnosis not present

## 2019-11-09 DIAGNOSIS — J449 Chronic obstructive pulmonary disease, unspecified: Secondary | ICD-10-CM | POA: Diagnosis not present

## 2019-11-09 DIAGNOSIS — M546 Pain in thoracic spine: Secondary | ICD-10-CM | POA: Diagnosis not present

## 2019-11-09 DIAGNOSIS — R200 Anesthesia of skin: Secondary | ICD-10-CM | POA: Diagnosis not present

## 2019-11-09 DIAGNOSIS — I700 Atherosclerosis of aorta: Secondary | ICD-10-CM | POA: Diagnosis not present

## 2019-11-09 DIAGNOSIS — M549 Dorsalgia, unspecified: Secondary | ICD-10-CM | POA: Diagnosis not present

## 2019-11-09 DIAGNOSIS — M542 Cervicalgia: Secondary | ICD-10-CM | POA: Diagnosis not present

## 2019-11-26 ENCOUNTER — Ambulatory Visit: Payer: Medicare Other | Admitting: Orthopedic Surgery

## 2019-11-27 DIAGNOSIS — E7800 Pure hypercholesterolemia, unspecified: Secondary | ICD-10-CM | POA: Diagnosis not present

## 2019-11-27 DIAGNOSIS — I10 Essential (primary) hypertension: Secondary | ICD-10-CM | POA: Diagnosis not present

## 2019-12-03 DIAGNOSIS — Z23 Encounter for immunization: Secondary | ICD-10-CM | POA: Diagnosis not present

## 2019-12-05 ENCOUNTER — Ambulatory Visit: Payer: Medicare Other

## 2019-12-05 ENCOUNTER — Other Ambulatory Visit: Payer: Self-pay

## 2019-12-05 ENCOUNTER — Encounter: Payer: Self-pay | Admitting: Orthopedic Surgery

## 2019-12-05 ENCOUNTER — Ambulatory Visit (INDEPENDENT_AMBULATORY_CARE_PROVIDER_SITE_OTHER): Payer: Medicare Other | Admitting: Orthopedic Surgery

## 2019-12-05 VITALS — BP 156/77 | HR 76 | Ht <= 58 in | Wt 144.0 lb

## 2019-12-05 DIAGNOSIS — G8929 Other chronic pain: Secondary | ICD-10-CM | POA: Diagnosis not present

## 2019-12-05 DIAGNOSIS — M25511 Pain in right shoulder: Secondary | ICD-10-CM | POA: Diagnosis not present

## 2019-12-05 DIAGNOSIS — M546 Pain in thoracic spine: Secondary | ICD-10-CM | POA: Diagnosis not present

## 2019-12-05 DIAGNOSIS — J3089 Other allergic rhinitis: Secondary | ICD-10-CM | POA: Diagnosis not present

## 2019-12-05 NOTE — Progress Notes (Signed)
Francisco Dohrmann  12/05/2019  Body mass index is 30.1 kg/m.   HISTORY SECTION :  Chief Complaint  Patient presents with  . Shoulder Pain    right/ at night  . Arm Pain    entire right arm painful /tingles pain into upper back/ thoracic   HPI 74 year old female history of breast cancer presents with periscapular pain on the right side associated with some tingling of the right hand with pain radiating from the shoulder down into the fingers.  Pain is primarily at night when she lies on her right side and she has some thoracic pain in that area as well no prior treatment  Referral from Dr. Woody Seller  She is on Duragesic 25 mcg an hour and hydrocodone 5 mg 2 times daily as needed but does not really take the medication that much as not he had any prior treatment for this  Review of Systems  Respiratory: Positive for sputum production.   Musculoskeletal: Positive for back pain.  Endo/Heme/Allergies: Positive for environmental allergies.  All other systems reviewed and are negative.    has a past medical history of Asthma, Chronic low back pain, Depression, Lobular carcinoma of left breast ([place]) (1997), Other and unspecified hyperlipidemia, and Seasonal allergies.   Past Surgical History:  Procedure Laterality Date  . CESAREAN SECTION  1986  . CHOLECYSTECTOMY      Body mass index is 30.1 kg/m.   Allergies  Allergen Reactions  . Other Rash    DURAGESIC/ANALGESICS - gel filled per patient      Current Outpatient Medications:  .  alendronate (FOSAMAX) 70 MG tablet, Take 1 tablet by mouth once a week., Disp: , Rfl:  .  ALPRAZolam (XANAX) 0.5 MG tablet, Take 0.25-5 mg by mouth daily as needed for anxiety. , Disp: , Rfl:  .  fentaNYL (DURAGESIC - DOSED MCG/HR) 25 MCG/HR patch, Place 25 mcg onto the skin every 3 (three) days. , Disp: , Rfl:  .  HYDROcodone-acetaminophen (NORCO/VICODIN) 5-325 MG per tablet, Take 1 tablet by mouth 2 (two) times daily as needed for moderate pain. ,  Disp: , Rfl:  .  nitroGLYCERIN (NITROSTAT) 0.4 MG SL tablet, Place 1 tablet (0.4 mg total) under the tongue every 5 (five) minutes as needed for chest pain., Disp: 25 tablet, Rfl: 3 .  omeprazole (PRILOSEC) 20 MG capsule, Take 20 mg by mouth daily., Disp: , Rfl:  .  predniSONE (DELTASONE) 20 MG tablet, Take 10-20 mg by mouth as needed. , Disp: , Rfl:  .  sertraline (ZOLOFT) 50 MG tablet, Take 1 tablet by mouth daily., Disp: , Rfl:  .  simvastatin (ZOCOR) 20 MG tablet, Take 20 mg by mouth daily., Disp: , Rfl:  .  SYMBICORT 160-4.5 MCG/ACT inhaler, Inhale 1 puff into the lungs 2 (two) times daily as needed. , Disp: , Rfl:  .  tiZANidine (ZANAFLEX) 4 MG tablet, Take 4 mg by mouth at bedtime as needed for muscle spasms., Disp: , Rfl:  .  albuterol (VENTOLIN HFA) 108 (90 Base) MCG/ACT inhaler, Inhale 2 puffs into the lungs every 6 (six) hours as needed for wheezing or shortness of breath., Disp: , Rfl:  .  zolpidem (AMBIEN) 10 MG tablet, Take 5-10 mg by mouth at bedtime. , Disp: , Rfl:    PHYSICAL EXAM SECTION: 1) BP (!) 156/77   Pulse 76   Ht 4\' 10"  (1.473 m)   Wt 144 lb (65.3 kg)   BMI 30.10 kg/m   Body mass index is  30.1 kg/m. General appearance: Well-developed well-nourished no gross deformities  2) Cardiovascular normal pulse and perfusion , normal color   3) Neurologically deep tendon reflexes are equal and normal, no sensation loss or deficits no pathologic reflexes  4) Psychological: Awake alert and oriented x3 mood and affect normal  5) Skin no lacerations or ulcerations no nodularity no palpable masses, no erythema or nodularity  6) Musculoskeletal:   Patient has significant kyphosis she has no midline tenderness but has tenderness on the right side of the scapula inferior to the scapular spine she has no pain with range of motion of either shoulder which is full she has good strength in abduction biceps triceps wrist extension grip strength finger abduction  Reflexes were 2+  and equal     MEDICAL DECISION MAKING  A.  Encounter Diagnoses  Name Primary?  . Chronic right shoulder pain   . Pain in thoracic spine   . Trigger point of right shoulder region Yes    B. DATA ANALYSED:  IMAGING:  Our x-rays show kyphosis in thoracic degenerative disc disease arthritis with normal right shoulder see report   Orders: Physical therapy was ordered  Outside records reviewed: Yes Dr. Epifanio Lesches notes are appreciated.  Primarily noted her pain medications her lab results show normal BUN and creatinine and her CBC shows a hemoglobin of 11 white count of 7 MCV was normal platelets 298 normal neutrophil count    C. MANAGEMENT do not see any bone lesions here and no evidence that surgery would be required  Recommend physical therapy for trigger point  If she does not improve after 3 to 4weeks recommend Dr. Woody Seller send her for evaluation with a neurologist  No orders of the defined types were placed in this encounter.     Arther Abbott, MD  12/05/2019 4:56 PM

## 2019-12-05 NOTE — Patient Instructions (Signed)
We will order physical therapy you should receive a phone call from the PT department. If you havent in 3 days please call us to follow up on it    Follow up with Dr Woody Seller if not improved after therapy I dont see any issues that would warrant surgery

## 2019-12-10 DIAGNOSIS — E2839 Other primary ovarian failure: Secondary | ICD-10-CM | POA: Diagnosis not present

## 2019-12-10 DIAGNOSIS — J3089 Other allergic rhinitis: Secondary | ICD-10-CM | POA: Diagnosis not present

## 2019-12-18 ENCOUNTER — Telehealth: Payer: Self-pay | Admitting: Orthopedic Surgery

## 2019-12-18 DIAGNOSIS — M25511 Pain in right shoulder: Secondary | ICD-10-CM

## 2019-12-18 DIAGNOSIS — G8929 Other chronic pain: Secondary | ICD-10-CM

## 2019-12-18 NOTE — Telephone Encounter (Signed)
Ms. Fricke called today stating that she was here on the 3rd and was to hold PT at [facility] in [place].  She said she has not heard anything from the facility.    Can you check on this for her?  Thanks

## 2019-12-18 NOTE — Telephone Encounter (Signed)
Left message for patient to advise order has been sent now she can call them to schedule gave her the number : [phone]

## 2019-12-18 NOTE — Telephone Encounter (Signed)
Order did not get sent. I have put in referral and faxed.

## 2019-12-24 DIAGNOSIS — E7800 Pure hypercholesterolemia, unspecified: Secondary | ICD-10-CM | POA: Diagnosis not present

## 2019-12-24 DIAGNOSIS — I10 Essential (primary) hypertension: Secondary | ICD-10-CM | POA: Diagnosis not present

## 2019-12-26 DIAGNOSIS — M25511 Pain in right shoulder: Secondary | ICD-10-CM | POA: Diagnosis not present

## 2019-12-31 DIAGNOSIS — Z23 Encounter for immunization: Secondary | ICD-10-CM | POA: Diagnosis not present

## 2020-01-01 DIAGNOSIS — M25511 Pain in right shoulder: Secondary | ICD-10-CM | POA: Diagnosis not present

## 2020-01-07 DIAGNOSIS — J3089 Other allergic rhinitis: Secondary | ICD-10-CM | POA: Diagnosis not present

## 2020-01-08 DIAGNOSIS — M25511 Pain in right shoulder: Secondary | ICD-10-CM | POA: Diagnosis not present

## 2020-01-09 DIAGNOSIS — I10 Essential (primary) hypertension: Secondary | ICD-10-CM | POA: Diagnosis not present

## 2020-01-09 DIAGNOSIS — M549 Dorsalgia, unspecified: Secondary | ICD-10-CM | POA: Diagnosis not present

## 2020-01-09 DIAGNOSIS — I700 Atherosclerosis of aorta: Secondary | ICD-10-CM | POA: Diagnosis not present

## 2020-01-09 DIAGNOSIS — Z299 Encounter for prophylactic measures, unspecified: Secondary | ICD-10-CM | POA: Diagnosis not present

## 2020-01-09 DIAGNOSIS — J449 Chronic obstructive pulmonary disease, unspecified: Secondary | ICD-10-CM | POA: Diagnosis not present

## 2020-01-14 DIAGNOSIS — M25511 Pain in right shoulder: Secondary | ICD-10-CM | POA: Diagnosis not present

## 2020-01-14 DIAGNOSIS — E7800 Pure hypercholesterolemia, unspecified: Secondary | ICD-10-CM | POA: Diagnosis not present

## 2020-01-14 DIAGNOSIS — I10 Essential (primary) hypertension: Secondary | ICD-10-CM | POA: Diagnosis not present

## 2020-01-22 DIAGNOSIS — M25511 Pain in right shoulder: Secondary | ICD-10-CM | POA: Diagnosis not present

## 2020-01-29 DIAGNOSIS — M25511 Pain in right shoulder: Secondary | ICD-10-CM | POA: Diagnosis not present

## 2020-02-01 DIAGNOSIS — J3089 Other allergic rhinitis: Secondary | ICD-10-CM | POA: Diagnosis not present

## 2020-02-01 DIAGNOSIS — Z8709 Personal history of other diseases of the respiratory system: Secondary | ICD-10-CM | POA: Diagnosis not present

## 2020-02-01 DIAGNOSIS — J309 Allergic rhinitis, unspecified: Secondary | ICD-10-CM | POA: Diagnosis not present

## 2020-02-14 ENCOUNTER — Ambulatory Visit (INDEPENDENT_AMBULATORY_CARE_PROVIDER_SITE_OTHER): Payer: Medicare Other | Admitting: Ophthalmology

## 2020-02-14 ENCOUNTER — Encounter (INDEPENDENT_AMBULATORY_CARE_PROVIDER_SITE_OTHER): Payer: Self-pay | Admitting: Ophthalmology

## 2020-02-14 ENCOUNTER — Other Ambulatory Visit: Payer: Self-pay

## 2020-02-14 DIAGNOSIS — H35351 Cystoid macular degeneration, right eye: Secondary | ICD-10-CM | POA: Diagnosis not present

## 2020-02-14 DIAGNOSIS — Z9889 Other specified postprocedural states: Secondary | ICD-10-CM | POA: Diagnosis not present

## 2020-02-14 DIAGNOSIS — H353111 Nonexudative age-related macular degeneration, right eye, early dry stage: Secondary | ICD-10-CM | POA: Diagnosis not present

## 2020-02-14 DIAGNOSIS — H35711 Central serous chorioretinopathy, right eye: Secondary | ICD-10-CM | POA: Insufficient documentation

## 2020-02-14 DIAGNOSIS — Z961 Presence of intraocular lens: Secondary | ICD-10-CM | POA: Diagnosis not present

## 2020-02-14 DIAGNOSIS — H353211 Exudative age-related macular degeneration, right eye, with active choroidal neovascularization: Secondary | ICD-10-CM | POA: Diagnosis not present

## 2020-02-14 DIAGNOSIS — H35361 Drusen (degenerative) of macula, right eye: Secondary | ICD-10-CM

## 2020-02-14 MED ORDER — BEVACIZUMAB CHEMO INJECTION 1.25MG/0.05ML SYRINGE FOR KALEIDOSCOPE
1.2500 mg | INTRAVITREAL | Status: AC | PRN
  Administered 2020-02-14: 1.25 mg via INTRAVITREAL

## 2020-02-14 NOTE — Progress Notes (Signed)
02/14/2020     CHIEF COMPLAINT Patient presents for Retina Follow Up   HISTORY OF PRESENT ILLNESS: Alexa Nichols is a 74 y.o. female who presents to the clinic today for:   HPI    Retina Follow Up    Patient presents with  Dry AMD (ERM OD).  In both eyes.  Severity is moderate.  Since onset it is stable.  I, the attending physician,  performed the HPI with the patient and updated documentation appropriately.          Comments    Pt referred by Dr. Katy Fitch for Possible Artery Occulusion. Pt was seen by Dr. Katy Fitch earlier today.  Pt has noticed an overall decrease in OD vision. Pt states she has been seeing a spot in the center of OD vision for several years. Pt states the spot has gradually become worse.        Last edited by Tilda Franco on 02/14/2020  3:06 PM. (History)      Referring physician: [facility] [address] [place],  [place] [address]  HISTORICAL INFORMATION:   Selected notes from the MEDICAL RECORD NUMBER       CURRENT MEDICATIONS: No current outpatient medications on file. (Ophthalmic Drugs)   No current facility-administered medications for this visit. (Ophthalmic Drugs)   Current Outpatient Medications (Other)  Medication Sig  . albuterol (VENTOLIN HFA) 108 (90 Base) MCG/ACT inhaler Inhale 2 puffs into the lungs every 6 (six) hours as needed for wheezing or shortness of breath.  Marland Kitchen alendronate (FOSAMAX) 70 MG tablet Take 1 tablet by mouth once a week.  . ALPRAZolam (XANAX) 0.5 MG tablet Take 0.25-5 mg by mouth daily as needed for anxiety.   . fentaNYL (DURAGESIC - DOSED MCG/HR) 25 MCG/HR patch Place 25 mcg onto the skin every 3 (three) days.   Marland Kitchen HYDROcodone-acetaminophen (NORCO/VICODIN) 5-325 MG per tablet Take 1 tablet by mouth 2 (two) times daily as needed for moderate pain.   . nitroGLYCERIN (NITROSTAT) 0.4 MG SL tablet Place 1 tablet (0.4 mg total) under the tongue every 5 (five) minutes as needed for chest pain. (Patient  not taking: Reported on 02/14/2020)  . omeprazole (PRILOSEC) 20 MG capsule Take 20 mg by mouth daily.  . predniSONE (DELTASONE) 20 MG tablet Take 10-20 mg by mouth as needed.   . sertraline (ZOLOFT) 50 MG tablet Take 1 tablet by mouth daily.  . simvastatin (ZOCOR) 20 MG tablet Take 20 mg by mouth daily.  . SYMBICORT 160-4.5 MCG/ACT inhaler Inhale 1 puff into the lungs 2 (two) times daily as needed.   Marland Kitchen tiZANidine (ZANAFLEX) 4 MG tablet Take 4 mg by mouth at bedtime as needed for muscle spasms.  Viviana Simpler ELLIPTA 100-62.5-25 MCG/INH AEPB SMARTSIG:1 Inhalation Via Inhaler Daily  . zolpidem (AMBIEN) 10 MG tablet Take 5-10 mg by mouth at bedtime.    No current facility-administered medications for this visit. (Other)      REVIEW OF SYSTEMS:    ALLERGIES Allergies  Allergen Reactions  . Aspirin   . Codeine   . Sulfa Antibiotics   . Other Rash    DURAGESIC/ANALGESICS - gel filled per patient     PAST MEDICAL HISTORY Past Medical History:  Diagnosis Date  . Asthma   . Chronic low back pain   . Depression   . Lobular carcinoma of left breast ([place]) 1997   in situ  . Other and unspecified hyperlipidemia   . Seasonal allergies  Past Surgical History:  Procedure Laterality Date  . CESAREAN SECTION  1986  . CHOLECYSTECTOMY      FAMILY HISTORY Family History  Problem Relation Age of Onset  . Osteoporosis Mother   . Other Father        head trauma  . Breast cancer Sister        X's 2 sisters    SOCIAL HISTORY Social History   Tobacco Use  . Smoking status: Former Smoker    Packs/day: 0.50    Years: 20.00    Pack years: 10.00    Types: Cigarettes    Start date: 09/16/1964    Quit date: 10/05/1983    Years since quitting: 36.3  . Smokeless tobacco: Never Used  Substance Use Topics  . Alcohol use: Not on file  . Drug use: Not on file         OPHTHALMIC EXAM:  Base Eye Exam    Visual Acuity (Snellen - Linear)      Right Left   Dist cc E Card @ 5' 20/20 -2    Correction: Glasses       Tonometry (Tonopen, 3:05 PM)      Right Left   Pressure 10 13       Pupils      Pupils Dark Light Shape React APD   Right PERRL 8 8 Round Dilated None   Left PERRL 8 8 Round Dilated None       Visual Fields (Counting fingers)      Left Right    Full Full       Neuro/Psych    Oriented x3: Yes   Mood/Affect: Normal        Slit Lamp and Fundus Exam    External Exam      Right Left   External Normal Normal       Slit Lamp Exam      Right Left   Lids/Lashes Normal Normal   Conjunctiva/Sclera White and quiet White and quiet   Cornea Clear Clear   Anterior Chamber Deep and quiet Deep and quiet   Iris Round and reactive Round and reactive   Lens Posterior chamber intraocular lens Posterior chamber intraocular lens   Anterior Vitreous Normal Normal       Fundus Exam      Right Left   Posterior Vitreous Clear vitrectomized    Disc Normal    C/D Ratio 0.1 0.1   Macula Macular thickening, Subretinal hemorrhage, Hard drusen, Retinal pigment epithelial mottling Hard drusen, no hemorrhage, no membrane, no macular thickening, no exudates   Vessels Normal    Periphery Normal           IMAGING AND PROCEDURES  Imaging and Procedures for 02/14/20  OCT, Retina - OU - Both Eyes       Right Eye Quality was good. Scan locations included subfoveal. Central Foveal Thickness: 410. Progression has worsened. Findings include abnormal foveal contour, subretinal fluid, subretinal scarring, retinal drusen , pigment epithelial detachment.   Left Eye Quality was good. Scan locations included subfoveal. Central Foveal Thickness: 318. Progression has been stable. Findings include retinal drusen .   Notes OD, with a history of subretinal fluid secondary to profound vitreal macular traction syndrome. This completely resolved with to 20/30 vision as recently as of 2020.  OD now with new subretinal fluid, exudative macular degeneration Wet with pigment  epithelial detachment. Will commence therapy today with intravitreal Avastin OD  ASSESSMENT/PLAN:  No problem-specific Assessment & Plan notes found for this encounter.      ICD-10-CM   1. Exudative age-related macular degeneration of right eye with active choroidal neovascularization ([place])  H35.3211   2. Degenerative retinal drusen of right eye  H35.361   3. Early stage nonexudative age-related macular degeneration of right eye  H35.3111 OCT, Retina - OU - Both Eyes    CANCELED: Color Fundus Photography Optos - OU - Both Eyes  4. History of vitrectomy  Z98.890     1.OD now with new subretinal fluid, exudative macular degeneration Wet with pigment epithelial detachment. Will commence therapy today with intravitreal Avastin OD  2.  3.  Ophthalmic Meds Ordered this visit:  No orders of the defined types were placed in this encounter.      Return in about 5 weeks (around 03/20/2020) for AVASTIN OCT, OD.  There are no Patient Instructions on file for this visit.   Explained the diagnoses, plan, and follow up with the patient and they expressed understanding.  Patient expressed understanding of the importance of proper follow up care.   Clent Demark Ica Daye M.D. Diseases & Surgery of the Retina and Vitreous [facility] 02/14/20     Abbreviations: M myopia (nearsighted); A astigmatism; H hyperopia (farsighted); P presbyopia; Mrx spectacle prescription;  CTL contact lenses; OD right eye; OS left eye; OU both eyes  XT exotropia; ET esotropia; PEK punctate epithelial keratitis; PEE punctate epithelial erosions; DES dry eye syndrome; MGD meibomian gland dysfunction; ATs artificial tears; PFAT's preservative free artificial tears; [place] nuclear sclerotic cataract; PSC posterior subcapsular cataract; ERM epi-retinal membrane; PVD posterior vitreous detachment; RD retinal detachment; DM diabetes mellitus; DR diabetic retinopathy; NPDR non-proliferative  diabetic retinopathy; PDR proliferative diabetic retinopathy; CSME clinically significant macular edema; DME diabetic macular edema; dbh dot blot hemorrhages; CWS cotton wool spot; POAG primary open angle glaucoma; C/D cup-to-disc ratio; HVF humphrey visual field; GVF goldmann visual field; OCT optical coherence tomography; IOP intraocular pressure; BRVO Branch retinal vein occlusion; CRVO central retinal vein occlusion; CRAO central retinal artery occlusion; BRAO branch retinal artery occlusion; RT retinal tear; SB scleral buckle; PPV pars plana vitrectomy; VH Vitreous hemorrhage; PRP panretinal laser photocoagulation; IVK intravitreal kenalog; VMT vitreomacular traction; MH Macular hole;  NVD neovascularization of the disc; NVE neovascularization elsewhere; AREDS age related eye disease study; ARMD age related macular degeneration; POAG primary open angle glaucoma; EBMD epithelial/anterior basement membrane dystrophy; ACIOL anterior chamber intraocular lens; IOL intraocular lens; PCIOL posterior chamber intraocular lens; Phaco/IOL phacoemulsification with intraocular lens placement; [place] photorefractive keratectomy; LASIK laser assisted in situ keratomileusis; HTN hypertension; DM diabetes mellitus; COPD chronic obstructive pulmonary disease

## 2020-02-18 DIAGNOSIS — L570 Actinic keratosis: Secondary | ICD-10-CM | POA: Diagnosis not present

## 2020-02-18 DIAGNOSIS — L280 Lichen simplex chronicus: Secondary | ICD-10-CM | POA: Diagnosis not present

## 2020-02-18 DIAGNOSIS — L738 Other specified follicular disorders: Secondary | ICD-10-CM | POA: Diagnosis not present

## 2020-02-28 DIAGNOSIS — J3089 Other allergic rhinitis: Secondary | ICD-10-CM | POA: Diagnosis not present

## 2020-03-02 DIAGNOSIS — E7800 Pure hypercholesterolemia, unspecified: Secondary | ICD-10-CM | POA: Diagnosis not present

## 2020-03-02 DIAGNOSIS — I10 Essential (primary) hypertension: Secondary | ICD-10-CM | POA: Diagnosis not present

## 2020-03-06 DIAGNOSIS — Z1231 Encounter for screening mammogram for malignant neoplasm of breast: Secondary | ICD-10-CM | POA: Diagnosis not present

## 2020-03-20 ENCOUNTER — Ambulatory Visit (INDEPENDENT_AMBULATORY_CARE_PROVIDER_SITE_OTHER): Payer: Medicare Other | Admitting: Ophthalmology

## 2020-03-20 ENCOUNTER — Other Ambulatory Visit: Payer: Self-pay

## 2020-03-20 ENCOUNTER — Encounter (INDEPENDENT_AMBULATORY_CARE_PROVIDER_SITE_OTHER): Payer: Self-pay | Admitting: Ophthalmology

## 2020-03-20 DIAGNOSIS — H353211 Exudative age-related macular degeneration, right eye, with active choroidal neovascularization: Secondary | ICD-10-CM | POA: Diagnosis not present

## 2020-03-20 MED ORDER — AFLIBERCEPT 2MG/0.05ML IZ SOLN FOR KALEIDOSCOPE
2.0000 mg | INTRAVITREAL | Status: AC | PRN
  Administered 2020-03-20: 2 mg via INTRAVITREAL

## 2020-03-20 NOTE — Progress Notes (Signed)
03/20/2020     CHIEF COMPLAINT Patient presents for Retina Follow Up   HISTORY OF PRESENT ILLNESS: Alexa Nichols is a 74 y.o. female who presents to the clinic today for:   HPI    Retina Follow Up    Patient presents with  Wet AMD.  In both eyes.  Severity is moderate.  Duration of 5 weeks.  Since onset it is stable.  I, the attending physician,  performed the HPI with the patient and updated documentation appropriately.          Comments    5 Week AMD f\u OU. FFA R\L. FP. Possible Avastin OD and OCT  Pt states OD vision is still blurry.        Last edited by Tilda Franco on 03/20/2020 12:57 PM. (History)      Referring physician: [name], MD [place],  [place] [address]  HISTORICAL INFORMATION:   Selected notes from the [place]: No current outpatient medications on file. (Ophthalmic Drugs)   No current facility-administered medications for this visit. (Ophthalmic Drugs)   Current Outpatient Medications (Other)  Medication Sig  . albuterol (VENTOLIN HFA) 108 (90 Base) MCG/ACT inhaler Inhale 2 puffs into the lungs every 6 (six) hours as needed for wheezing or shortness of breath.  Marland Kitchen alendronate (FOSAMAX) 70 MG tablet Take 1 tablet by mouth once a week.  . ALPRAZolam (XANAX) 0.5 MG tablet Take 0.25-5 mg by mouth daily as needed for anxiety.   . fentaNYL (DURAGESIC - DOSED MCG/HR) 25 MCG/HR patch Place 25 mcg onto the skin every 3 (three) days.   Marland Kitchen HYDROcodone-acetaminophen (NORCO/VICODIN) 5-325 MG per tablet Take 1 tablet by mouth 2 (two) times daily as needed for moderate pain.   . nitroGLYCERIN (NITROSTAT) 0.4 MG SL tablet Place 1 tablet (0.4 mg total) under the tongue every 5 (five) minutes as needed for chest pain. (Patient not taking: Reported on 02/14/2020)  . omeprazole (PRILOSEC) 20 MG capsule Take 20 mg by mouth daily.  . predniSONE (DELTASONE) 20 MG tablet Take 10-20 mg by mouth as needed.   .  sertraline (ZOLOFT) 50 MG tablet Take 1 tablet by mouth daily.  . simvastatin (ZOCOR) 20 MG tablet Take 20 mg by mouth daily.  . SYMBICORT 160-4.5 MCG/ACT inhaler Inhale 1 puff into the lungs 2 (two) times daily as needed.   Marland Kitchen tiZANidine (ZANAFLEX) 4 MG tablet Take 4 mg by mouth at bedtime as needed for muscle spasms.  Viviana Simpler ELLIPTA 100-62.5-25 MCG/INH AEPB SMARTSIG:1 Inhalation Via Inhaler Daily  . zolpidem (AMBIEN) 10 MG tablet Take 5-10 mg by mouth at bedtime.    No current facility-administered medications for this visit. (Other)      REVIEW OF SYSTEMS:    ALLERGIES Allergies  Allergen Reactions  . Aspirin   . Codeine   . Sulfa Antibiotics   . Other Rash    DURAGESIC/ANALGESICS - gel filled per patient     PAST MEDICAL HISTORY Past Medical History:  Diagnosis Date  . Asthma   . Chronic low back pain   . Depression   . Lobular carcinoma of left breast ([place]) 1997   in situ  . Other and unspecified hyperlipidemia   . Seasonal allergies    Past Surgical History:  Procedure Laterality Date  . CESAREAN SECTION  1986  . CHOLECYSTECTOMY      FAMILY HISTORY Family History  Problem Relation Age of  Onset  . Osteoporosis Mother   . Other Father        head trauma  . Breast cancer Sister        X's 2 sisters    SOCIAL HISTORY Social History   Tobacco Use  . Smoking status: Former Smoker    Packs/day: 0.50    Years: 20.00    Pack years: 10.00    Types: Cigarettes    Start date: 09/16/1964    Quit date: 10/05/1983    Years since quitting: 36.4  . Smokeless tobacco: Never Used  Substance Use Topics  . Alcohol use: Not on file  . Drug use: Not on file         OPHTHALMIC EXAM:  Base Eye Exam    Visual Acuity (Snellen - Linear)      Right Left   Dist cc E Card @ 4' 2020 -1   Dist ph cc NI    Correction: Glasses       Tonometry (Tonopen, 1:02 PM)      Right Left   Pressure 12 13       Pupils      Pupils Dark Light Shape React APD   Right  PERRL 4 3 Round Brisk None   Left PERRL 4 3 Round Brisk None       Visual Fields (Counting fingers)      Left Right    Full Full       Neuro/Psych    Oriented x3: Yes   Mood/Affect: Normal       Dilation    Both eyes: 1.0% Mydriacyl, 2.5% Phenylephrine @ 1:02 PM        Slit Lamp and Fundus Exam    External Exam      Right Left   External Normal Normal       Slit Lamp Exam      Right Left   Lids/Lashes Normal Normal   Conjunctiva/Sclera White and quiet White and quiet   Cornea Clear Clear   Anterior Chamber Deep and quiet Deep and quiet   Iris Round and reactive Round and reactive   Lens Posterior chamber intraocular lens Posterior chamber intraocular lens   Anterior Vitreous Normal Normal       Fundus Exam      Right Left   Posterior Vitreous Clear vitrectomized Normal   Disc Normal Normal   C/D Ratio 0.1 0.1   Macula Macular thickening, new Subretinal hemorrhage, Hard drusen, Retinal pigment epithelial mottling Hard drusen, no hemorrhage, no membrane, no macular thickening, no exudates   Vessels Normal Normal   Periphery Normal Normal          IMAGING AND PROCEDURES  Imaging and Procedures for 03/20/20  OCT, Retina - OU - Both Eyes       Right Eye Quality was good. Scan locations included subfoveal. Central Foveal Thickness: 733. Progression has worsened. Findings include subretinal fluid, subretinal scarring, choroidal neovascular membrane, cystoid macular edema.   Left Eye Quality was good. Scan locations included subfoveal. Central Foveal Thickness: 313. Progression has been stable. Findings include retinal drusen .   Notes OD, macular contour significantly worse with increased subretinal fluid, intraretinal fluid, and subretinal hemorrhage after Avastin no. 1. \ We will need to switch to intravitreal Eylea OD today  OS no active CNVM       Color Fundus Photography Optos - OU - Both Eyes       Right Eye Progression has worsened. Disc  findings  include normal observations. Macula : hemorrhage. Vessels : normal observations. Periphery : normal observations.   Left Eye Progression has been stable. Disc findings include normal observations. Macula : drusen. Vessels : normal observations. Periphery : normal observations.   Notes New subretinal hemorrhage in the right eye in the macular location, incidental PVD  OS with dry ARMD       Fluorescein Angiography Optos (Transit OD)       Right Eye   Progression has no prior data. Early phase findings include window defect, blockage, choroidal neovascularization. Mid/Late phase findings include pooling, choroidal neovascularization. Choroidal neovascularization is subfoveal, occult.   Left Eye   Progression has no prior data.                 ASSESSMENT/PLAN:  No problem-specific Assessment & Plan notes found for this encounter.      ICD-10-CM   1. Exudative age-related macular degeneration of right eye with active choroidal neovascularization (HCC)  H35.3211 OCT, Retina - OU - Both Eyes    Color Fundus Photography Optos - OU - Both Eyes    Fluorescein Angiography Optos (Transit OD)    1.  2.  3.  Ophthalmic Meds Ordered this visit:  No orders of the defined types were placed in this encounter.      Return in about 5 weeks (around 04/24/2020) for EYLEA OCT, OD.  There are no Patient Instructions on file for this visit.   Explained the diagnoses, plan, and follow up with the patient and they expressed understanding.  Patient expressed understanding of the importance of proper follow up care.   Clent Demark Temperance Kelemen M.D. Diseases & Surgery of the Retina and Vitreous Retina [facility] Diabetic [facility] 03/20/20     Abbreviations: M myopia (nearsighted); A astigmatism; H hyperopia (farsighted); P presbyopia; Mrx spectacle prescription;  CTL contact lenses; OD right eye; OS left eye; OU both eyes  XT exotropia; ET esotropia; PEK punctate epithelial  keratitis; PEE punctate epithelial erosions; DES dry eye syndrome; MGD meibomian gland dysfunction; ATs artificial tears; PFAT's preservative free artificial tears; [place] nuclear sclerotic cataract; PSC posterior subcapsular cataract; ERM epi-retinal membrane; PVD posterior vitreous detachment; RD retinal detachment; DM diabetes mellitus; DR diabetic retinopathy; NPDR non-proliferative diabetic retinopathy; PDR proliferative diabetic retinopathy; CSME clinically significant macular edema; DME diabetic macular edema; dbh dot blot hemorrhages; CWS cotton wool spot; POAG primary open angle glaucoma; C/D cup-to-disc ratio; HVF humphrey visual field; GVF goldmann visual field; OCT optical coherence tomography; IOP intraocular pressure; BRVO Branch retinal vein occlusion; CRVO central retinal vein occlusion; CRAO central retinal artery occlusion; BRAO branch retinal artery occlusion; RT retinal tear; SB scleral buckle; PPV pars plana vitrectomy; VH Vitreous hemorrhage; PRP panretinal laser photocoagulation; IVK intravitreal kenalog; VMT vitreomacular traction; MH Macular hole;  NVD neovascularization of the disc; NVE neovascularization elsewhere; AREDS age related eye disease study; ARMD age related macular degeneration; POAG primary open angle glaucoma; EBMD epithelial/anterior basement membrane dystrophy; ACIOL anterior chamber intraocular lens; IOL intraocular lens; PCIOL posterior chamber intraocular lens; Phaco/IOL phacoemulsification with intraocular lens placement; [place] photorefractive keratectomy; LASIK laser assisted in situ keratomileusis; HTN hypertension; DM diabetes mellitus; COPD chronic obstructive pulmonary disease

## 2020-04-02 DIAGNOSIS — I10 Essential (primary) hypertension: Secondary | ICD-10-CM | POA: Diagnosis not present

## 2020-04-02 DIAGNOSIS — E7800 Pure hypercholesterolemia, unspecified: Secondary | ICD-10-CM | POA: Diagnosis not present

## 2020-04-16 DIAGNOSIS — I10 Essential (primary) hypertension: Secondary | ICD-10-CM | POA: Diagnosis not present

## 2020-04-16 DIAGNOSIS — R599 Enlarged lymph nodes, unspecified: Secondary | ICD-10-CM | POA: Diagnosis not present

## 2020-04-16 DIAGNOSIS — H6980 Other specified disorders of Eustachian tube, unspecified ear: Secondary | ICD-10-CM | POA: Diagnosis not present

## 2020-04-16 DIAGNOSIS — J029 Acute pharyngitis, unspecified: Secondary | ICD-10-CM | POA: Diagnosis not present

## 2020-04-16 DIAGNOSIS — Z299 Encounter for prophylactic measures, unspecified: Secondary | ICD-10-CM | POA: Diagnosis not present

## 2020-04-22 DIAGNOSIS — Z87891 Personal history of nicotine dependence: Secondary | ICD-10-CM | POA: Diagnosis not present

## 2020-04-22 DIAGNOSIS — J3089 Other allergic rhinitis: Secondary | ICD-10-CM | POA: Diagnosis not present

## 2020-04-22 DIAGNOSIS — J449 Chronic obstructive pulmonary disease, unspecified: Secondary | ICD-10-CM | POA: Diagnosis not present

## 2020-04-24 ENCOUNTER — Ambulatory Visit (INDEPENDENT_AMBULATORY_CARE_PROVIDER_SITE_OTHER): Payer: Medicare Other | Admitting: Ophthalmology

## 2020-04-24 ENCOUNTER — Encounter (INDEPENDENT_AMBULATORY_CARE_PROVIDER_SITE_OTHER): Payer: Self-pay | Admitting: Ophthalmology

## 2020-04-24 ENCOUNTER — Other Ambulatory Visit: Payer: Self-pay

## 2020-04-24 DIAGNOSIS — H353211 Exudative age-related macular degeneration, right eye, with active choroidal neovascularization: Secondary | ICD-10-CM

## 2020-04-24 MED ORDER — AFLIBERCEPT 2MG/0.05ML IZ SOLN FOR KALEIDOSCOPE
2.0000 mg | INTRAVITREAL | Status: AC | PRN
  Administered 2020-04-24: 2 mg via INTRAVITREAL

## 2020-04-24 NOTE — Progress Notes (Signed)
04/24/2020     CHIEF COMPLAINT Patient presents for Retina Follow Up   HISTORY OF PRESENT ILLNESS: Alexa Nichols is a 74 y.o. female who presents to the clinic today for:   HPI    Retina Follow Up    Patient presents with  Wet AMD.  In right eye.  This started 5 weeks ago.  Severity is mild.  Duration of 5 weeks.  Since onset it is stable.          Comments    5 Week AMD F/U OD, poss Eylea OD  Pt c/o strobe lights OU that come and go quickly lasting approx 20 seconds, especially upon waking. Pt denies any changes to [place] OU. Pt reports HA over OD at times.       Last edited by Rockie Neighbours, [place] on 04/24/2020  1:43 PM. (History)      Referring physician: [name], MD [place],  [place] [address]  HISTORICAL INFORMATION:   Selected notes from the [facility]: No current outpatient medications on file. (Ophthalmic Drugs)   No current facility-administered medications for this visit. (Ophthalmic Drugs)   Current Outpatient Medications (Other)  Medication Sig  . albuterol (VENTOLIN HFA) 108 (90 Base) MCG/ACT inhaler Inhale 2 puffs into the lungs every 6 (six) hours as needed for wheezing or shortness of breath.  Marland Kitchen alendronate (FOSAMAX) 70 MG tablet Take 1 tablet by mouth once a week.  . ALPRAZolam (XANAX) 0.5 MG tablet Take 0.25-5 mg by mouth daily as needed for anxiety.   . fentaNYL (DURAGESIC - DOSED MCG/HR) 25 MCG/HR patch Place 25 mcg onto the skin every 3 (three) days.   Marland Kitchen HYDROcodone-acetaminophen (NORCO/VICODIN) 5-325 MG per tablet Take 1 tablet by mouth 2 (two) times daily as needed for moderate pain.   . nitroGLYCERIN (NITROSTAT) 0.4 MG SL tablet Place 1 tablet (0.4 mg total) under the tongue every 5 (five) minutes as needed for chest pain. (Patient not taking: Reported on 02/14/2020)  . omeprazole (PRILOSEC) 20 MG capsule Take 20 mg by mouth daily.  . predniSONE (DELTASONE) 20 MG tablet Take 10-20 mg by mouth as  needed.   . sertraline (ZOLOFT) 50 MG tablet Take 1 tablet by mouth daily.  . simvastatin (ZOCOR) 20 MG tablet Take 20 mg by mouth daily.  . SYMBICORT 160-4.5 MCG/ACT inhaler Inhale 1 puff into the lungs 2 (two) times daily as needed.   Marland Kitchen tiZANidine (ZANAFLEX) 4 MG tablet Take 4 mg by mouth at bedtime as needed for muscle spasms.  Viviana Simpler ELLIPTA 100-62.5-25 MCG/INH AEPB SMARTSIG:1 Inhalation Via Inhaler Daily  . zolpidem (AMBIEN) 10 MG tablet Take 5-10 mg by mouth at bedtime.    No current facility-administered medications for this visit. (Other)      REVIEW OF SYSTEMS:    ALLERGIES Allergies  Allergen Reactions  . Aspirin   . Codeine   . Sulfa Antibiotics   . Other Rash    DURAGESIC/ANALGESICS - gel filled per patient     PAST MEDICAL HISTORY Past Medical History:  Diagnosis Date  . Asthma   . Chronic low back pain   . Depression   . Lobular carcinoma of left breast ([facility]) 1997   in situ  . Other and unspecified hyperlipidemia   . Seasonal allergies    Past Surgical History:  Procedure Laterality Date  . CESAREAN SECTION  1986  . CHOLECYSTECTOMY  FAMILY HISTORY Family History  Problem Relation Age of Onset  . Osteoporosis Mother   . Other Father        head trauma  . Breast cancer Sister        X's 2 sisters    SOCIAL HISTORY Social History   Tobacco Use  . Smoking status: Former Smoker    Packs/day: 0.50    Years: 20.00    Pack years: 10.00    Types: Cigarettes    Start date: 09/16/1964    Quit date: 10/05/1983    Years since quitting: 36.5  . Smokeless tobacco: Never Used  Substance Use Topics  . Alcohol use: Not on file  . Drug use: Not on file         OPHTHALMIC EXAM:  Base Eye Exam    Visual Acuity (ETDRS)      Right Left   Dist cc 20/100 20/20 -1   Dist ph cc NI    Correction: Glasses       Tonometry (Tonopen, 1:47 PM)      Right Left   Pressure 13 10       Pupils      Pupils Dark Light Shape React APD   Right  PERRL 4 3 Round Brisk None   Left PERRL 4 3 Round Brisk None       Visual Fields (Counting fingers)      Left Right    Full Full       Extraocular Movement      Right Left    Full Full       Neuro/Psych    Oriented x3: Yes   Mood/Affect: Normal       Dilation    Right eye: 1.0% Mydriacyl, 2.5% Phenylephrine @ 1:47 PM        Slit Lamp and Fundus Exam    External Exam      Right Left   External Normal Normal       Slit Lamp Exam      Right Left   Lids/Lashes Normal Normal   Conjunctiva/Sclera White and quiet White and quiet   Cornea Clear Clear   Anterior Chamber Deep and quiet Deep and quiet   Iris Round and reactive Round and reactive   Lens Posterior chamber intraocular lens Posterior chamber intraocular lens   Anterior Vitreous Normal Normal       Fundus Exam      Right Left   Posterior Vitreous Clear vitrectomized    Disc Normal    C/D Ratio 0.2    Macula Macular thickening, less Subretinal hemorrhage, Hard drusen, Retinal pigment epithelial rip    Vessels Normal    Periphery Normal           IMAGING AND PROCEDURES  Imaging and Procedures for 04/24/20  OCT, Retina - OU - Both Eyes       Right Eye Quality was good. Scan locations included subfoveal. Central Foveal Thickness: 548. Progression has improved. Findings include retinal drusen , subretinal fluid, subretinal scarring, choroidal neovascular membrane.   Left Eye Quality was good. Scan locations included subfoveal. Progression has been stable. Findings include retinal drusen , abnormal foveal contour.   Notes OD, with RPE rip nodule, subretinal hemorrhage.  Much less active status post Eylea.  Repeat today and examination in 5 weeks       Intravitreal Injection, Pharmacologic Agent - OD - Right Eye       Time Out 04/24/2020. 2:49 PM. Confirmed correct  patient, procedure, site, and patient consented.   Anesthesia Topical anesthesia was used. Anesthetic medications included Akten  3.5%.   Procedure Preparation included Ofloxacin , 10% betadine to eyelids, 5% betadine to ocular surface. A 30 gauge needle was used.   Injection:  2 mg aflibercept Alfonse Flavors) SOLN   NDC: [id number], Lot: [id number]   Route: Intravitreal, Site: Right Eye, Waste: 0 mg  Post-op Post injection exam found visual acuity of at least counting fingers. The patient tolerated the procedure well. There were no complications. The patient received written and verbal post procedure care education.                 ASSESSMENT/PLAN:  Exudative age-related macular degeneration of right eye with active choroidal neovascularization (HCC) Repeat injection Eylea OD, improved with subretinal hemorrhage diminished, some clinical appearance with RPE rip.      ICD-10-CM   1. Exudative age-related macular degeneration of right eye with active choroidal neovascularization (HCC)  H35.3211 OCT, Retina - OU - Both Eyes    Intravitreal Injection, Pharmacologic Agent - OD - Right Eye    aflibercept (EYLEA) SOLN 2 mg    1.  2.  3.  Ophthalmic Meds Ordered this visit:  Meds ordered this encounter  Medications  . aflibercept (EYLEA) SOLN 2 mg       Return in about 5 weeks (around 05/29/2020) for dilate, OD, EYLEA OCT.  There are no Patient Instructions on file for this visit.   Explained the diagnoses, plan, and follow up with the patient and they expressed understanding.  Patient expressed understanding of the importance of proper follow up care.   Clent Demark Rhondalyn Clingan M.D. Diseases & Surgery of the Retina and Vitreous Retina & Diabetic [place] 04/24/20     Abbreviations: M myopia (nearsighted); A astigmatism; H hyperopia (farsighted); P presbyopia; Mrx spectacle prescription;  CTL contact lenses; OD right eye; OS left eye; OU both eyes  XT exotropia; ET esotropia; PEK punctate epithelial keratitis; PEE punctate epithelial erosions; DES dry eye syndrome; MGD meibomian gland dysfunction; ATs  artificial tears; PFAT's preservative free artificial tears; [place] nuclear sclerotic cataract; PSC posterior subcapsular cataract; ERM epi-retinal membrane; PVD posterior vitreous detachment; RD retinal detachment; DM diabetes mellitus; DR diabetic retinopathy; NPDR non-proliferative diabetic retinopathy; PDR proliferative diabetic retinopathy; CSME clinically significant macular edema; DME diabetic macular edema; dbh dot blot hemorrhages; CWS cotton wool spot; POAG primary open angle glaucoma; C/D cup-to-disc ratio; HVF humphrey visual field; GVF goldmann visual field; OCT optical coherence tomography; IOP intraocular pressure; BRVO Branch retinal vein occlusion; CRVO central retinal vein occlusion; CRAO central retinal artery occlusion; BRAO branch retinal artery occlusion; RT retinal tear; SB scleral buckle; PPV pars plana vitrectomy; VH Vitreous hemorrhage; PRP panretinal laser photocoagulation; IVK intravitreal kenalog; VMT vitreomacular traction; MH Macular hole;  NVD neovascularization of the disc; NVE neovascularization elsewhere; AREDS age related eye disease study; ARMD age related macular degeneration; POAG primary open angle glaucoma; EBMD epithelial/anterior basement membrane dystrophy; ACIOL anterior chamber intraocular lens; IOL intraocular lens; PCIOL posterior chamber intraocular lens; Phaco/IOL phacoemulsification with intraocular lens placement; [place] photorefractive keratectomy; LASIK laser assisted in situ keratomileusis; HTN hypertension; DM diabetes mellitus; COPD chronic obstructive pulmonary disease

## 2020-04-24 NOTE — Assessment & Plan Note (Addendum)
Repeat injection Eylea OD, improved with subretinal hemorrhage diminished, some clinical appearance with RPE rip.

## 2020-04-25 DIAGNOSIS — J3089 Other allergic rhinitis: Secondary | ICD-10-CM | POA: Diagnosis not present

## 2020-05-02 DIAGNOSIS — E7800 Pure hypercholesterolemia, unspecified: Secondary | ICD-10-CM | POA: Diagnosis not present

## 2020-05-02 DIAGNOSIS — I10 Essential (primary) hypertension: Secondary | ICD-10-CM | POA: Diagnosis not present

## 2020-05-28 DIAGNOSIS — I10 Essential (primary) hypertension: Secondary | ICD-10-CM | POA: Diagnosis not present

## 2020-05-28 DIAGNOSIS — Z299 Encounter for prophylactic measures, unspecified: Secondary | ICD-10-CM | POA: Diagnosis not present

## 2020-05-28 DIAGNOSIS — I700 Atherosclerosis of aorta: Secondary | ICD-10-CM | POA: Diagnosis not present

## 2020-05-28 DIAGNOSIS — Z79899 Other long term (current) drug therapy: Secondary | ICD-10-CM | POA: Diagnosis not present

## 2020-05-28 DIAGNOSIS — J449 Chronic obstructive pulmonary disease, unspecified: Secondary | ICD-10-CM | POA: Diagnosis not present

## 2020-05-28 DIAGNOSIS — M549 Dorsalgia, unspecified: Secondary | ICD-10-CM | POA: Diagnosis not present

## 2020-05-28 DIAGNOSIS — F322 Major depressive disorder, single episode, severe without psychotic features: Secondary | ICD-10-CM | POA: Diagnosis not present

## 2020-05-29 ENCOUNTER — Other Ambulatory Visit: Payer: Self-pay

## 2020-05-29 ENCOUNTER — Ambulatory Visit (INDEPENDENT_AMBULATORY_CARE_PROVIDER_SITE_OTHER): Payer: Medicare Other | Admitting: Ophthalmology

## 2020-05-29 ENCOUNTER — Encounter (INDEPENDENT_AMBULATORY_CARE_PROVIDER_SITE_OTHER): Payer: Self-pay | Admitting: Ophthalmology

## 2020-05-29 DIAGNOSIS — H353211 Exudative age-related macular degeneration, right eye, with active choroidal neovascularization: Secondary | ICD-10-CM | POA: Diagnosis not present

## 2020-05-29 MED ORDER — AFLIBERCEPT 2MG/0.05ML IZ SOLN FOR KALEIDOSCOPE
2.0000 mg | INTRAVITREAL | Status: AC | PRN
  Administered 2020-05-29: 2 mg via INTRAVITREAL

## 2020-05-29 NOTE — Progress Notes (Signed)
05/29/2020     CHIEF COMPLAINT Patient presents for Retina Follow Up   HISTORY OF PRESENT ILLNESS: Alexa Nichols is a 74 y.o. female who presents to the clinic today for:   HPI    Retina Follow Up    Patient presents with  Wet AMD.  In right eye.  This started 5 weeks ago.  Severity is mild.  Duration of 5 weeks.  Since onset it is stable.          Comments    5 Week AMD F/U OD, poss Eylea OD  Pt denies any visual improvement OD. VA stable OS. No new symptoms per pt.       Last edited by Rockie Neighbours, [place] on 05/29/2020  2:30 PM. (History)      Referring physician: [name], MD [place],  [place] [address]  HISTORICAL INFORMATION:   Selected notes from the [facility]: No current outpatient medications on file. (Ophthalmic Drugs)   No current facility-administered medications for this visit. (Ophthalmic Drugs)   Current Outpatient Medications (Other)  Medication Sig  . albuterol (VENTOLIN HFA) 108 (90 Base) MCG/ACT inhaler Inhale 2 puffs into the lungs every 6 (six) hours as needed for wheezing or shortness of breath.  Marland Kitchen alendronate (FOSAMAX) 70 MG tablet Take 1 tablet by mouth once a week.  . ALPRAZolam (XANAX) 0.5 MG tablet Take 0.25-5 mg by mouth daily as needed for anxiety.   . fentaNYL (DURAGESIC - DOSED MCG/HR) 25 MCG/HR patch Place 25 mcg onto the skin every 3 (three) days.   Marland Kitchen HYDROcodone-acetaminophen (NORCO/VICODIN) 5-325 MG per tablet Take 1 tablet by mouth 2 (two) times daily as needed for moderate pain.   . nitroGLYCERIN (NITROSTAT) 0.4 MG SL tablet Place 1 tablet (0.4 mg total) under the tongue every 5 (five) minutes as needed for chest pain. (Patient not taking: Reported on 02/14/2020)  . omeprazole (PRILOSEC) 20 MG capsule Take 20 mg by mouth daily.  . predniSONE (DELTASONE) 20 MG tablet Take 10-20 mg by mouth as needed.   . sertraline (ZOLOFT) 50 MG tablet Take 1 tablet by mouth daily.  . simvastatin  (ZOCOR) 20 MG tablet Take 20 mg by mouth daily.  . SYMBICORT 160-4.5 MCG/ACT inhaler Inhale 1 puff into the lungs 2 (two) times daily as needed.   Marland Kitchen tiZANidine (ZANAFLEX) 4 MG tablet Take 4 mg by mouth at bedtime as needed for muscle spasms.  Viviana Simpler ELLIPTA 100-62.5-25 MCG/INH AEPB SMARTSIG:1 Inhalation Via Inhaler Daily  . zolpidem (AMBIEN) 10 MG tablet Take 5-10 mg by mouth at bedtime.    No current facility-administered medications for this visit. (Other)      REVIEW OF SYSTEMS:    ALLERGIES Allergies  Allergen Reactions  . Aspirin   . Codeine   . Sulfa Antibiotics   . Other Rash    DURAGESIC/ANALGESICS - gel filled per patient     PAST MEDICAL HISTORY Past Medical History:  Diagnosis Date  . Asthma   . Chronic low back pain   . Depression   . Lobular carcinoma of left breast ([facility]) 1997   in situ  . Other and unspecified hyperlipidemia   . Seasonal allergies    Past Surgical History:  Procedure Laterality Date  . CESAREAN SECTION  1986  . CHOLECYSTECTOMY      FAMILY HISTORY Family History  Problem Relation Age of Onset  . Osteoporosis Mother   .  Other Father        head trauma  . Breast cancer Sister        X's 2 sisters    SOCIAL HISTORY Social History   Tobacco Use  . Smoking status: Former Smoker    Packs/day: 0.50    Years: 20.00    Pack years: 10.00    Types: Cigarettes    Start date: 09/16/1964    Quit date: 10/05/1983    Years since quitting: 36.6  . Smokeless tobacco: Never Used  Substance Use Topics  . Alcohol use: Not on file  . Drug use: Not on file         OPHTHALMIC EXAM: Base Eye Exam    Visual Acuity (ETDRS)      Right Left   Dist cc 20/100 -2 20/20 -2   Dist ph cc NI    Correction: Glasses       Tonometry (Tonopen, 2:31 PM)      Right Left   Pressure 10 10       Pupils      Pupils Dark Light Shape React APD   Right PERRL 4 3 Round Brisk None   Left PERRL 4 3 Round Brisk None       Visual Fields (Counting  fingers)      Left Right    Full Full       Extraocular Movement      Right Left    Full Full       Neuro/Psych    Oriented x3: Yes   Mood/Affect: Normal       Dilation    Right eye: 1.0% Mydriacyl, 2.5% Phenylephrine @ 2:35 PM        Slit Lamp and Fundus Exam    External Exam      Right Left   External Normal Normal       Slit Lamp Exam      Right Left   Lids/Lashes Normal Normal   Conjunctiva/Sclera White and quiet White and quiet   Cornea Clear Clear   Anterior Chamber Deep and quiet Deep and quiet   Iris Round and reactive Round and reactive   Lens Posterior chamber intraocular lens Posterior chamber intraocular lens   Anterior Vitreous Normal Normal       Fundus Exam      Right Left   Posterior Vitreous Clear vitrectomized    Disc Normal    C/D Ratio 0.2    Macula Macular thickening, less Subretinal hemorrhage, Hard drusen, Retinal pigment epithelial rip.  And sub-RPE heme still persists    Vessels Normal    Periphery Normal           IMAGING AND PROCEDURES  Imaging and Procedures for 05/29/20  OCT, Retina - OU - Both Eyes       Right Eye Quality was good. Scan locations included subfoveal. Central Foveal Thickness: 517. Progression has improved. Findings include abnormal foveal contour.   Left Eye Quality was good. Scan locations included subfoveal. Central Foveal Thickness: 310. Progression has been stable. Findings include no SRF, retinal drusen .   Notes Much less subretinal hemorrhage,'s much less subretinal fluid OD, repeat examination in 6-7 weeks       Intravitreal Injection, Pharmacologic Agent - OD - Right Eye       Time Out 05/29/2020. 3:41 PM. Confirmed correct patient, procedure, site, and patient consented.   Anesthesia Topical anesthesia was used. Anesthetic medications included Akten 3.5%.   Procedure Preparation included  Tobramycin 0.3%, 10% betadine to eyelids, 5% betadine to ocular surface. A 30 gauge needle was  used.   Injection:  2 mg aflibercept Alfonse Flavors) SOLN   NDC: A3590391, Lot: [id number]   Route: Intravitreal, Site: Right Eye, Waste: 0 mg  Post-op Post injection exam found visual acuity of at least counting fingers. The patient tolerated the procedure well. There were no complications. The patient received written and verbal post procedure care education. Post injection medications were not given.                 ASSESSMENT/PLAN:  Exudative age-related macular degeneration of right eye with active choroidal neovascularization (HCC) Much less subretinal hemorrhage,'s much less subretinal fluid OD, repeat examination in 6-7 weeks overall improved anatomy with stable vision yet component of clinical subretinal hemorrhage has a nice chance of improving acuity as the hemorrhage itself clears      ICD-10-CM   1. Exudative age-related macular degeneration of right eye with active choroidal neovascularization (HCC)  H35.3211 OCT, Retina - OU - Both Eyes    Intravitreal Injection, Pharmacologic Agent - OD - Right Eye    aflibercept (EYLEA) SOLN 2 mg    1.  Clinically subretinal hemorrhage and sub-RPE hemorrhage still limits visual acuity in the right eye yet much less subretinal fluid on intravitreal Eylea.  We will repeat injection today and examination in 6 to 7 weeks  2.  3.  Ophthalmic Meds Ordered this visit:  Meds ordered this encounter  Medications  . aflibercept (EYLEA) SOLN 2 mg       Return in about 7 weeks (around 07/17/2020) for dilate, OD, EYLEA OCT.  There are no Patient Instructions on file for this visit.   Explained the diagnoses, plan, and follow up with the patient and they expressed understanding.  Patient expressed understanding of the importance of proper follow up care.   Clent Demark Hazel Wrinkle M.D. Diseases & Surgery of the Retina and Vitreous Retina & Diabetic [place] 05/29/20     Abbreviations: M myopia (nearsighted); A astigmatism; H  hyperopia (farsighted); P presbyopia; Mrx spectacle prescription;  CTL contact lenses; OD right eye; OS left eye; OU both eyes  XT exotropia; ET esotropia; PEK punctate epithelial keratitis; PEE punctate epithelial erosions; DES dry eye syndrome; MGD meibomian gland dysfunction; ATs artificial tears; PFAT's preservative free artificial tears; [place] nuclear sclerotic cataract; PSC posterior subcapsular cataract; ERM epi-retinal membrane; PVD posterior vitreous detachment; RD retinal detachment; DM diabetes mellitus; DR diabetic retinopathy; NPDR non-proliferative diabetic retinopathy; PDR proliferative diabetic retinopathy; CSME clinically significant macular edema; DME diabetic macular edema; dbh dot blot hemorrhages; CWS cotton wool spot; POAG primary open angle glaucoma; C/D cup-to-disc ratio; HVF humphrey visual field; GVF goldmann visual field; OCT optical coherence tomography; IOP intraocular pressure; BRVO Branch retinal vein occlusion; CRVO central retinal vein occlusion; CRAO central retinal artery occlusion; BRAO branch retinal artery occlusion; RT retinal tear; SB scleral buckle; PPV pars plana vitrectomy; VH Vitreous hemorrhage; PRP panretinal laser photocoagulation; IVK intravitreal kenalog; VMT vitreomacular traction; MH Macular hole;  NVD neovascularization of the disc; NVE neovascularization elsewhere; AREDS age related eye disease study; ARMD age related macular degeneration; POAG primary open angle glaucoma; EBMD epithelial/anterior basement membrane dystrophy; ACIOL anterior chamber intraocular lens; IOL intraocular lens; PCIOL posterior chamber intraocular lens; Phaco/IOL phacoemulsification with intraocular lens placement; [place] photorefractive keratectomy; LASIK laser assisted in situ keratomileusis; HTN hypertension; DM diabetes mellitus; COPD chronic obstructive pulmonary disease

## 2020-05-29 NOTE — Assessment & Plan Note (Signed)
Much less subretinal hemorrhage,'s much less subretinal fluid OD, repeat examination in 6-7 weeks overall improved anatomy with stable vision yet component of clinical subretinal hemorrhage has a nice chance of improving acuity as the hemorrhage itself clears

## 2020-06-02 DIAGNOSIS — J3089 Other allergic rhinitis: Secondary | ICD-10-CM | POA: Diagnosis not present

## 2020-07-03 DIAGNOSIS — I10 Essential (primary) hypertension: Secondary | ICD-10-CM | POA: Diagnosis not present

## 2020-07-03 DIAGNOSIS — J3089 Other allergic rhinitis: Secondary | ICD-10-CM | POA: Diagnosis not present

## 2020-07-03 DIAGNOSIS — E7800 Pure hypercholesterolemia, unspecified: Secondary | ICD-10-CM | POA: Diagnosis not present

## 2020-07-21 ENCOUNTER — Encounter (INDEPENDENT_AMBULATORY_CARE_PROVIDER_SITE_OTHER): Payer: Self-pay | Admitting: Ophthalmology

## 2020-07-21 ENCOUNTER — Ambulatory Visit (INDEPENDENT_AMBULATORY_CARE_PROVIDER_SITE_OTHER): Payer: Medicare Other | Admitting: Ophthalmology

## 2020-07-21 ENCOUNTER — Other Ambulatory Visit: Payer: Self-pay

## 2020-07-21 DIAGNOSIS — H353211 Exudative age-related macular degeneration, right eye, with active choroidal neovascularization: Secondary | ICD-10-CM

## 2020-07-21 DIAGNOSIS — H353121 Nonexudative age-related macular degeneration, left eye, early dry stage: Secondary | ICD-10-CM

## 2020-07-21 MED ORDER — AFLIBERCEPT 2MG/0.05ML IZ SOLN FOR KALEIDOSCOPE
2.0000 mg | INTRAVITREAL | Status: AC | PRN
  Administered 2020-07-21: 2 mg via INTRAVITREAL

## 2020-07-21 NOTE — Assessment & Plan Note (Signed)
OD still active at 8-week follow-up.  With subfoveal CNVM subretinal fluid and pigment epithelial detachment.  We will repeat injection intravitreal Eylea OD today and examination in 7 weeks

## 2020-07-21 NOTE — Assessment & Plan Note (Signed)

## 2020-07-21 NOTE — Patient Instructions (Signed)
Patient instructed to contact the office promptly for new visual acuity declines or distortions

## 2020-07-21 NOTE — Progress Notes (Signed)
07/21/2020     CHIEF COMPLAINT Patient presents for Retina Follow Up   HISTORY OF PRESENT ILLNESS: Alexa Nichols is a 74 y.o. female who presents to the clinic today for:   HPI    Retina Follow Up    Patient presents with  Wet AMD.  In right eye.  This started 8 weeks ago.  Severity is mild.  Duration of 8 weeks.  Since onset it is stable.          Comments    8 Week AMD F/U OD, poss Eylea OD  Pt denies noticeable changes to [place] OU since last visit. Pt denies ocular pain, flashes of light, or floaters OU.         Last edited by Rockie Neighbours, [place] on 07/21/2020  1:44 PM. (History)      Referring physician: [name], MD [place],   [id number]  HISTORICAL INFORMATION:   Selected notes from the [facility]: No current outpatient medications on file. (Ophthalmic Drugs)   No current facility-administered medications for this visit. (Ophthalmic Drugs)   Current Outpatient Medications (Other)  Medication Sig  . albuterol (VENTOLIN HFA) 108 (90 Base) MCG/ACT inhaler Inhale 2 puffs into the lungs every 6 (six) hours as needed for wheezing or shortness of breath.  Marland Kitchen alendronate (FOSAMAX) 70 MG tablet Take 1 tablet by mouth once a week.  . ALPRAZolam (XANAX) 0.5 MG tablet Take 0.25-5 mg by mouth daily as needed for anxiety.   . fentaNYL (DURAGESIC - DOSED MCG/HR) 25 MCG/HR patch Place 25 mcg onto the skin every 3 (three) days.   Marland Kitchen HYDROcodone-acetaminophen (NORCO/VICODIN) 5-325 MG per tablet Take 1 tablet by mouth 2 (two) times daily as needed for moderate pain.   . nitroGLYCERIN (NITROSTAT) 0.4 MG SL tablet Place 1 tablet (0.4 mg total) under the tongue every 5 (five) minutes as needed for chest pain. (Patient not taking: Reported on 02/14/2020)  . omeprazole (PRILOSEC) 20 MG capsule Take 20 mg by mouth daily.  . predniSONE (DELTASONE) 20 MG tablet Take 10-20 mg by mouth as needed.   . sertraline (ZOLOFT) 50 MG tablet  Take 1 tablet by mouth daily.  . simvastatin (ZOCOR) 20 MG tablet Take 20 mg by mouth daily.  . SYMBICORT 160-4.5 MCG/ACT inhaler Inhale 1 puff into the lungs 2 (two) times daily as needed.   Marland Kitchen tiZANidine (ZANAFLEX) 4 MG tablet Take 4 mg by mouth at bedtime as needed for muscle spasms.  Viviana Simpler ELLIPTA 100-62.5-25 MCG/INH AEPB SMARTSIG:1 Inhalation Via Inhaler Daily  . zolpidem (AMBIEN) 10 MG tablet Take 5-10 mg by mouth at bedtime.    No current facility-administered medications for this visit. (Other)      REVIEW OF SYSTEMS:    ALLERGIES Allergies  Allergen Reactions  . Aspirin   . Codeine   . Sulfa Antibiotics   . Other Rash    DURAGESIC/ANALGESICS - gel filled per patient     PAST MEDICAL HISTORY Past Medical History:  Diagnosis Date  . Asthma   . Chronic low back pain   . Depression   . Lobular carcinoma of left breast ([place]) 1997   in situ  . Other and unspecified hyperlipidemia   . Seasonal allergies    Past Surgical History:  Procedure Laterality Date  . CESAREAN SECTION  1986  . CHOLECYSTECTOMY      FAMILY HISTORY Family History  Problem Relation Age of  Onset  . Osteoporosis Mother   . Other Father        head trauma  . Breast cancer Sister        X's 2 sisters    SOCIAL HISTORY Social History   Tobacco Use  . Smoking status: Former Smoker    Packs/day: 0.50    Years: 20.00    Pack years: 10.00    Types: Cigarettes    Start date: 09/16/1964    Quit date: 10/05/1983    Years since quitting: 36.8  . Smokeless tobacco: Never Used  Substance Use Topics  . Alcohol use: Not on file  . Drug use: Not on file         OPHTHALMIC EXAM:  Base Eye Exam    Visual Acuity (ETDRS)      Right Left   Dist cc 20/200 20/25 -1   Dist ph cc NI    Correction: Glasses       Tonometry (Tonopen, 1:44 PM)      Right Left   Pressure 09 09       Pupils      Pupils Dark Light Shape React APD   Right PERRL 4 3 Round Brisk None   Left PERRL 4 3 Round  Brisk None       Visual Fields (Counting fingers)      Left Right    Full Full       Extraocular Movement      Right Left    Full Full       Neuro/Psych    Oriented x3: Yes   Mood/Affect: Normal       Dilation    Right eye: 1.0% Mydriacyl, 2.5% Phenylephrine @ 1:47 PM        Slit Lamp and Fundus Exam    External Exam      Right Left   External Normal Normal       Slit Lamp Exam      Right Left   Lids/Lashes Normal Normal   Conjunctiva/Sclera White and quiet White and quiet   Cornea Clear Clear   Anterior Chamber Deep and quiet Deep and quiet   Iris Round and reactive Round and reactive   Lens Posterior chamber intraocular lens Posterior chamber intraocular lens   Anterior Vitreous Normal Normal       Fundus Exam      Right Left   Posterior Vitreous Clear vitrectomized    Disc Normal    C/D Ratio 0.2    Macula Macular thickening, less Subretinal hemorrhage, Hard drusen, Retinal pigment epithelial rip.  And sub-RPE heme still persists    Vessels Normal    Periphery Normal           IMAGING AND PROCEDURES  Imaging and Procedures for 07/21/20  OCT, Retina - OU - Both Eyes       Right Eye Quality was good. Scan locations included subfoveal. Central Foveal Thickness: 487. Progression has improved. Findings include abnormal foveal contour, intraretinal fluid, subretinal fluid, choroidal neovascular membrane.   Left Eye Quality was good. Scan locations included subfoveal. Central Foveal Thickness: 310. Progression has been stable. Findings include no SRF, retinal drusen .   Notes Much less subretinal hemorrhage,'s much less subretinal fluid OD, repeat examination in 6-7 weeks       Intravitreal Injection, Pharmacologic Agent - OD - Right Eye       Time Out 07/21/2020. 2:48 PM. Confirmed correct patient, procedure, site, and patient consented.  Anesthesia Topical anesthesia was used. Anesthetic medications included Akten 3.5%.    Procedure Preparation included Tobramycin 0.3%, 10% betadine to eyelids, 5% betadine to ocular surface. A 30 gauge needle was used.   Injection:  2 mg aflibercept Alfonse Flavors) SOLN   NDC: [id number], Lot: [id number]   Route: Intravitreal, Site: Right Eye, Waste: 0 mg  Post-op Post injection exam found visual acuity of at least counting fingers. The patient tolerated the procedure well. There were no complications. The patient received written and verbal post procedure care education. Post injection medications were not given.                 ASSESSMENT/PLAN:  Exudative age-related macular degeneration of right eye with active choroidal neovascularization ([place]) OD still active at 8-week follow-up.  With subfoveal CNVM subretinal fluid and pigment epithelial detachment.  We will repeat injection intravitreal Eylea OD today and examination in 7 weeks  Early stage nonexudative age-related macular degeneration of left eye The nature of age--related macular degeneration was discussed with the patient as well as the distinction between dry and wet types. Checking an Amsler Grid daily with advice to return immediately should a distortion develop, was given to the patient. The patient 's smoking status now and in the past was determined and advice based on the AREDS study was provided regarding the consumption of antioxidant supplements. AREDS 2 vitamin formulation was recommended. Consumption of dark leafy vegetables and fresh fruits of various colors was recommended. Treatment modalities for wet macular degeneration particularly the use of intravitreal injections of anti-blood vessel growth factors was discussed with the patient. Avastin, Lucentis, and Eylea are the available options. On occasion, therapy includes the use of photodynamic therapy and thermal laser. Stressed to the patient do not rub eyes.  Patient was advised to check Amsler Grid daily and return immediately if changes are noted.  Instructions on using the grid were given to the patient. All patient questions were answered.      ICD-10-CM   1. Exudative age-related macular degeneration of right eye with active choroidal neovascularization (HCC)  H35.3211 OCT, Retina - OU - Both Eyes    Intravitreal Injection, Pharmacologic Agent - OD - Right Eye    aflibercept (EYLEA) SOLN 2 mg  2. Early stage nonexudative age-related macular degeneration of left eye  H35.3121     1.  Continue subretinal hemorrhage, pigment epithelial detachment and elevation of the fovea OD on intravitreal Eylea today.  We will repeat an injection and examination right eye in 7 weeks  2.  No signs of active CNVM OS  3.  Ophthalmic Meds Ordered this visit:  Meds ordered this encounter  Medications  . aflibercept (EYLEA) SOLN 2 mg       Return in about 7 weeks (around 09/08/2020) for dilate, OD, EYLEA OCT.  Patient Instructions  Patient instructed to contact the office promptly for new visual acuity declines or distortions    Explained the diagnoses, plan, and follow up with the patient and they expressed understanding.  Patient expressed understanding of the importance of proper follow up care.   Clent Demark Ishmail Mcmanamon M.D. Diseases & Surgery of the Retina and Vitreous Retina & Diabetic [place] 07/21/20     Abbreviations: M myopia (nearsighted); A astigmatism; H hyperopia (farsighted); P presbyopia; Mrx spectacle prescription;  CTL contact lenses; OD right eye; OS left eye; OU both eyes  XT exotropia; ET esotropia; PEK punctate epithelial keratitis; PEE punctate epithelial erosions; DES dry eye syndrome; MGD meibomian gland  dysfunction; ATs artificial tears; PFAT's preservative free artificial tears; [place] nuclear sclerotic cataract; PSC posterior subcapsular cataract; ERM epi-retinal membrane; PVD posterior vitreous detachment; RD retinal detachment; DM diabetes mellitus; DR diabetic retinopathy; NPDR non-proliferative diabetic retinopathy;  PDR proliferative diabetic retinopathy; CSME clinically significant macular edema; DME diabetic macular edema; dbh dot blot hemorrhages; CWS cotton wool spot; POAG primary open angle glaucoma; C/D cup-to-disc ratio; HVF humphrey visual field; GVF goldmann visual field; OCT optical coherence tomography; IOP intraocular pressure; BRVO Branch retinal vein occlusion; CRVO central retinal vein occlusion; CRAO central retinal artery occlusion; BRAO branch retinal artery occlusion; RT retinal tear; SB scleral buckle; PPV pars plana vitrectomy; VH Vitreous hemorrhage; PRP panretinal laser photocoagulation; IVK intravitreal kenalog; VMT vitreomacular traction; MH Macular hole;  NVD neovascularization of the disc; NVE neovascularization elsewhere; AREDS age related eye disease study; ARMD age related macular degeneration; POAG primary open angle glaucoma; EBMD epithelial/anterior basement membrane dystrophy; ACIOL anterior chamber intraocular lens; IOL intraocular lens; PCIOL posterior chamber intraocular lens; Phaco/IOL phacoemulsification with intraocular lens placement; [place] photorefractive keratectomy; LASIK laser assisted in situ keratomileusis; HTN hypertension; DM diabetes mellitus; COPD chronic obstructive pulmonary disease

## 2020-07-31 DIAGNOSIS — J3089 Other allergic rhinitis: Secondary | ICD-10-CM | POA: Diagnosis not present

## 2020-08-01 DIAGNOSIS — E7800 Pure hypercholesterolemia, unspecified: Secondary | ICD-10-CM | POA: Diagnosis not present

## 2020-08-01 DIAGNOSIS — I10 Essential (primary) hypertension: Secondary | ICD-10-CM | POA: Diagnosis not present

## 2020-08-27 DIAGNOSIS — J3089 Other allergic rhinitis: Secondary | ICD-10-CM | POA: Diagnosis not present

## 2020-09-02 DIAGNOSIS — E7800 Pure hypercholesterolemia, unspecified: Secondary | ICD-10-CM | POA: Diagnosis not present

## 2020-09-02 DIAGNOSIS — I10 Essential (primary) hypertension: Secondary | ICD-10-CM | POA: Diagnosis not present

## 2020-09-08 ENCOUNTER — Other Ambulatory Visit: Payer: Self-pay

## 2020-09-08 ENCOUNTER — Ambulatory Visit (INDEPENDENT_AMBULATORY_CARE_PROVIDER_SITE_OTHER): Payer: Medicare Other | Admitting: Ophthalmology

## 2020-09-08 ENCOUNTER — Encounter (INDEPENDENT_AMBULATORY_CARE_PROVIDER_SITE_OTHER): Payer: Self-pay | Admitting: Ophthalmology

## 2020-09-08 DIAGNOSIS — H353211 Exudative age-related macular degeneration, right eye, with active choroidal neovascularization: Secondary | ICD-10-CM | POA: Diagnosis not present

## 2020-09-08 DIAGNOSIS — H353121 Nonexudative age-related macular degeneration, left eye, early dry stage: Secondary | ICD-10-CM

## 2020-09-08 MED ORDER — AFLIBERCEPT 2MG/0.05ML IZ SOLN FOR KALEIDOSCOPE
2.0000 mg | INTRAVITREAL | Status: AC | PRN
  Administered 2020-09-08: 2 mg via INTRAVITREAL

## 2020-09-08 NOTE — Progress Notes (Signed)
09/08/2020     CHIEF COMPLAINT Patient presents for Retina Follow Up   HISTORY OF PRESENT ILLNESS: Alexa Nichols is a 74 y.o. female who presents to the clinic today for:   HPI    Retina Follow Up    Patient presents with  Wet AMD.  In right eye.  Severity is moderate.  Duration of 7 weeks.  Since onset it is stable.  I, the attending physician,  performed the HPI with the patient and updated documentation appropriately.          Comments    7 Week Wet AMD f\u OD. Possible Eylea OD. OCT  Pt states vision is not any better. Denies decrease in vision.       Last edited by Tilda Franco on 09/08/2020  1:07 PM. (History)      Referring physician: [name], MD [place],  [place] [address]  HISTORICAL INFORMATION:   Selected notes from the [facility]: No current outpatient medications on file. (Ophthalmic Drugs)   No current facility-administered medications for this visit. (Ophthalmic Drugs)   Current Outpatient Medications (Other)  Medication Sig  . albuterol (VENTOLIN HFA) 108 (90 Base) MCG/ACT inhaler Inhale 2 puffs into the lungs every 6 (six) hours as needed for wheezing or shortness of breath.  Marland Kitchen alendronate (FOSAMAX) 70 MG tablet Take 1 tablet by mouth once a week.  . ALPRAZolam (XANAX) 0.5 MG tablet Take 0.25-5 mg by mouth daily as needed for anxiety.   . fentaNYL (DURAGESIC - DOSED MCG/HR) 25 MCG/HR patch Place 25 mcg onto the skin every 3 (three) days.   Marland Kitchen HYDROcodone-acetaminophen (NORCO/VICODIN) 5-325 MG per tablet Take 1 tablet by mouth 2 (two) times daily as needed for moderate pain.   . nitroGLYCERIN (NITROSTAT) 0.4 MG SL tablet Place 1 tablet (0.4 mg total) under the tongue every 5 (five) minutes as needed for chest pain. (Patient not taking: Reported on 02/14/2020)  . omeprazole (PRILOSEC) 20 MG capsule Take 20 mg by mouth daily.  . predniSONE (DELTASONE) 20 MG tablet Take 10-20 mg by mouth as needed.     . sertraline (ZOLOFT) 50 MG tablet Take 1 tablet by mouth daily.  . simvastatin (ZOCOR) 20 MG tablet Take 20 mg by mouth daily.  . SYMBICORT 160-4.5 MCG/ACT inhaler Inhale 1 puff into the lungs 2 (two) times daily as needed.   Marland Kitchen tiZANidine (ZANAFLEX) 4 MG tablet Take 4 mg by mouth at bedtime as needed for muscle spasms.  Viviana Simpler ELLIPTA 100-62.5-25 MCG/INH AEPB SMARTSIG:1 Inhalation Via Inhaler Daily  . zolpidem (AMBIEN) 10 MG tablet Take 5-10 mg by mouth at bedtime.    No current facility-administered medications for this visit. (Other)      REVIEW OF SYSTEMS:    ALLERGIES Allergies  Allergen Reactions  . Aspirin   . Codeine   . Sulfa Antibiotics   . Other Rash    DURAGESIC/ANALGESICS - gel filled per patient     PAST MEDICAL HISTORY Past Medical History:  Diagnosis Date  . Asthma   . Chronic low back pain   . Depression   . Lobular carcinoma of left breast ([place]) 1997   in situ  . Other and unspecified hyperlipidemia   . Seasonal allergies    Past Surgical History:  Procedure Laterality Date  . CESAREAN SECTION  1986  . CHOLECYSTECTOMY      FAMILY HISTORY Family History  Problem Relation  Age of Onset  . Osteoporosis Mother   . Other Father        head trauma  . Breast cancer Sister        X's 2 sisters    SOCIAL HISTORY Social History   Tobacco Use  . Smoking status: Former Smoker    Packs/day: 0.50    Years: 20.00    Pack years: 10.00    Types: Cigarettes    Start date: 09/16/1964    Quit date: 10/05/1983    Years since quitting: 36.9  . Smokeless tobacco: Never Used  Substance Use Topics  . Alcohol use: Not on file  . Drug use: Not on file         OPHTHALMIC EXAM: Base Eye Exam    Visual Acuity (Snellen - Linear)      Right Left   Dist cc 20/200 20/20   Dist ph cc NI    Correction: Glasses       Tonometry (Tonopen, 1:11 PM)      Right Left   Pressure 13 12       Pupils      Pupils Dark Light Shape React APD   Right PERRL 4  3 Round Brisk None   Left PERRL 4 3 Round Brisk None       Visual Fields (Counting fingers)      Left Right    Full Full       Neuro/Psych    Oriented x3: Yes   Mood/Affect: Normal       Dilation    Right eye: 1.0% Mydriacyl, 2.5% Phenylephrine @ 1:11 PM        Slit Lamp and Fundus Exam    External Exam      Right Left   External Normal Normal       Slit Lamp Exam      Right Left   Lids/Lashes Normal Normal   Conjunctiva/Sclera White and quiet White and quiet   Cornea Clear Clear   Anterior Chamber Deep and quiet Deep and quiet   Iris Round and reactive Round and reactive   Lens Posterior chamber intraocular lens Posterior chamber intraocular lens   Anterior Vitreous Normal Normal       Fundus Exam      Right Left   Posterior Vitreous Clear vitrectomized    Disc Normal    C/D Ratio 0.2    Macula Macular thickening, less Subretinal hemorrhage, Hard drusen, Retinal pigment epithelial rip.  And sub-RPE heme still persists, Subretinal hemorrhage, Retinal pigment epithelial detachment    Vessels Normal    Periphery Normal           IMAGING AND PROCEDURES  Imaging and Procedures for 09/08/20  OCT, Retina - OU - Both Eyes       Right Eye Quality was good. Scan locations included subfoveal. Central Foveal Thickness: 506. Progression has improved. Findings include abnormal foveal contour, intraretinal fluid, subretinal fluid, choroidal neovascular membrane.   Left Eye Quality was good. Scan locations included subfoveal. Central Foveal Thickness: 318. Progression has been stable. Findings include no SRF, retinal drusen .   Notes Much less subretinal hemorrhage,'s much less subretinal fluid OD, repeat examination in 6-7 weeks  Much less intraretinal fluid OD, vision limited by subfoveal scarring       Intravitreal Injection, Pharmacologic Agent - OD - Right Eye       Time Out 09/08/2020. 2:46 PM. Confirmed correct patient, procedure, site, and patient  consented.  Anesthesia Topical anesthesia was used. Anesthetic medications included Akten 3.5%.   Procedure Preparation included Tobramycin 0.3%, 10% betadine to eyelids, 5% betadine to ocular surface. A 30 gauge needle was used.   Injection:  2 mg aflibercept Alfonse Flavors) SOLN   NDC: [id number], Lot: [id number]   Route: Intravitreal, Site: Right Eye, Waste: 0 mg  Post-op Post injection exam found visual acuity of at least counting fingers. The patient tolerated the procedure well. There were no complications. The patient received written and verbal post procedure care education. Post injection medications were not given.                 ASSESSMENT/PLAN:  Exudative age-related macular degeneration of right eye with active choroidal neovascularization (HCC) Much less active subfoveal CNVM with less intraretinal fluid nonetheless, Active disease with less subretinal fluid noted  Early stage nonexudative age-related macular degeneration of left eye Still no signs of CNVM OS      ICD-10-CM   1. Exudative age-related macular degeneration of right eye with active choroidal neovascularization (HCC)  H35.3211 OCT, Retina - OU - Both Eyes    Intravitreal Injection, Pharmacologic Agent - OD - Right Eye    aflibercept (EYLEA) SOLN 2 mg  2. Early stage nonexudative age-related macular degeneration of left eye  H35.3121     1.  Much improved macular anatomy right eye yet vision limited due to subfoveal scarring, less subretinal fluid smaller pigment epithelial detachment and much less intraretinal fluid OD on Eylea, and 7-week interval today  2.  Repeat examination OU in 7 weeks, dilated OU, likely injection OD  3.  Ophthalmic Meds Ordered this visit:  Meds ordered this encounter  Medications  . aflibercept (EYLEA) SOLN 2 mg       Return in about 7 weeks (around 10/27/2020) for OD, EYLEA OCT, DILATE OU.  There are no Patient Instructions on file for this visit.   Explained  the diagnoses, plan, and follow up with the patient and they expressed understanding.  Patient expressed understanding of the importance of proper follow up care.   Clent Demark Alayzia Pavlock M.D. Diseases & Surgery of the Retina and Vitreous Retina & Diabetic [place] 09/08/20     Abbreviations: M myopia (nearsighted); A astigmatism; H hyperopia (farsighted); P presbyopia; Mrx spectacle prescription;  CTL contact lenses; OD right eye; OS left eye; OU both eyes  XT exotropia; ET esotropia; PEK punctate epithelial keratitis; PEE punctate epithelial erosions; DES dry eye syndrome; MGD meibomian gland dysfunction; ATs artificial tears; PFAT's preservative free artificial tears; [place] nuclear sclerotic cataract; PSC posterior subcapsular cataract; ERM epi-retinal membrane; PVD posterior vitreous detachment; RD retinal detachment; DM diabetes mellitus; DR diabetic retinopathy; NPDR non-proliferative diabetic retinopathy; PDR proliferative diabetic retinopathy; CSME clinically significant macular edema; DME diabetic macular edema; dbh dot blot hemorrhages; CWS cotton wool spot; POAG primary open angle glaucoma; C/D cup-to-disc ratio; HVF humphrey visual field; GVF goldmann visual field; OCT optical coherence tomography; IOP intraocular pressure; BRVO Branch retinal vein occlusion; CRVO central retinal vein occlusion; CRAO central retinal artery occlusion; BRAO branch retinal artery occlusion; RT retinal tear; SB scleral buckle; PPV pars plana vitrectomy; VH Vitreous hemorrhage; PRP panretinal laser photocoagulation; IVK intravitreal kenalog; VMT vitreomacular traction; MH Macular hole;  NVD neovascularization of the disc; NVE neovascularization elsewhere; AREDS age related eye disease study; ARMD age related macular degeneration; POAG primary open angle glaucoma; EBMD epithelial/anterior basement membrane dystrophy; ACIOL anterior chamber intraocular lens; IOL intraocular lens; PCIOL posterior chamber intraocular lens;  Phaco/IOL phacoemulsification with  intraocular lens placement; [place] photorefractive keratectomy; LASIK laser assisted in situ keratomileusis; HTN hypertension; DM diabetes mellitus; COPD chronic obstructive pulmonary disease

## 2020-09-08 NOTE — Assessment & Plan Note (Signed)
Still no signs of CNVM OS

## 2020-09-08 NOTE — Assessment & Plan Note (Signed)
Much less active subfoveal CNVM with less intraretinal fluid nonetheless, Active disease with less subretinal fluid noted

## 2020-09-24 DIAGNOSIS — D649 Anemia, unspecified: Secondary | ICD-10-CM | POA: Diagnosis not present

## 2020-09-24 DIAGNOSIS — F419 Anxiety disorder, unspecified: Secondary | ICD-10-CM | POA: Diagnosis not present

## 2020-09-24 DIAGNOSIS — J3089 Other allergic rhinitis: Secondary | ICD-10-CM | POA: Diagnosis not present

## 2020-09-24 DIAGNOSIS — R5383 Other fatigue: Secondary | ICD-10-CM | POA: Diagnosis not present

## 2020-09-24 DIAGNOSIS — Z299 Encounter for prophylactic measures, unspecified: Secondary | ICD-10-CM | POA: Diagnosis not present

## 2020-09-24 DIAGNOSIS — M546 Pain in thoracic spine: Secondary | ICD-10-CM | POA: Diagnosis not present

## 2020-09-24 DIAGNOSIS — R0600 Dyspnea, unspecified: Secondary | ICD-10-CM | POA: Diagnosis not present

## 2020-09-25 DIAGNOSIS — D649 Anemia, unspecified: Secondary | ICD-10-CM | POA: Diagnosis not present

## 2020-10-02 DIAGNOSIS — I10 Essential (primary) hypertension: Secondary | ICD-10-CM | POA: Diagnosis not present

## 2020-10-02 DIAGNOSIS — E7800 Pure hypercholesterolemia, unspecified: Secondary | ICD-10-CM | POA: Diagnosis not present

## 2020-10-21 DIAGNOSIS — J3081 Allergic rhinitis due to animal (cat) (dog) hair and dander: Secondary | ICD-10-CM | POA: Diagnosis not present

## 2020-10-21 DIAGNOSIS — J45909 Unspecified asthma, uncomplicated: Secondary | ICD-10-CM | POA: Diagnosis not present

## 2020-10-21 DIAGNOSIS — R942 Abnormal results of pulmonary function studies: Secondary | ICD-10-CM | POA: Diagnosis not present

## 2020-10-21 DIAGNOSIS — J45990 Exercise induced bronchospasm: Secondary | ICD-10-CM | POA: Diagnosis not present

## 2020-10-21 DIAGNOSIS — R0600 Dyspnea, unspecified: Secondary | ICD-10-CM | POA: Diagnosis not present

## 2020-10-21 DIAGNOSIS — J449 Chronic obstructive pulmonary disease, unspecified: Secondary | ICD-10-CM | POA: Diagnosis not present

## 2020-10-22 DIAGNOSIS — J3089 Other allergic rhinitis: Secondary | ICD-10-CM | POA: Diagnosis not present

## 2020-10-27 ENCOUNTER — Ambulatory Visit (INDEPENDENT_AMBULATORY_CARE_PROVIDER_SITE_OTHER): Payer: Medicare Other | Admitting: Ophthalmology

## 2020-10-27 ENCOUNTER — Other Ambulatory Visit: Payer: Self-pay

## 2020-10-27 ENCOUNTER — Encounter (INDEPENDENT_AMBULATORY_CARE_PROVIDER_SITE_OTHER): Payer: Self-pay | Admitting: Ophthalmology

## 2020-10-27 DIAGNOSIS — H353122 Nonexudative age-related macular degeneration, left eye, intermediate dry stage: Secondary | ICD-10-CM | POA: Diagnosis not present

## 2020-10-27 DIAGNOSIS — H353211 Exudative age-related macular degeneration, right eye, with active choroidal neovascularization: Secondary | ICD-10-CM

## 2020-10-27 MED ORDER — AFLIBERCEPT 2MG/0.05ML IZ SOLN FOR KALEIDOSCOPE
2.0000 mg | INTRAVITREAL | Status: AC | PRN
  Administered 2020-10-27: 2 mg via INTRAVITREAL

## 2020-10-27 NOTE — Assessment & Plan Note (Signed)
The nature of wet macular degeneration was discussed with the patient.  Forms of therapy reviewed include the use of Anti-VEGF medications injected painlessly into the eye, as well as other possible treatment modalities, including thermal laser therapy. Fellow eye involvement and risks were discussed with the patient. Upon the finding of wet age related macular degeneration, treatment will be offered. The treatment regimen is on a treat as needed basis with the intent to treat if necessary and extend interval of exams when possible. On average 1 out of 6 patients do not need lifetime therapy. However, the risk of recurrent disease is high for a lifetime.  Initially monthly, then periodic, examinations and evaluations will determine whether the next treatment is required on the day of the examination.  OD, much less CNVM activity, on Eylea, at 7-week interval.  Repeat injection today and examination again in 7 weeks

## 2020-10-27 NOTE — Progress Notes (Signed)
10/27/2020     CHIEF COMPLAINT Patient presents for Retina Follow Up (7 Week AMD F/U OU, poss Eylea OD//Pt reports improving VA OD. No new symptoms reported OU.)   HISTORY OF PRESENT ILLNESS: Alexa Nichols is a 75 y.o. female who presents to the clinic today for:   HPI    Retina Follow Up    Patient presents with  Wet AMD.  In right eye.  This started 7 weeks ago.  Severity is mild.  Duration of 7 weeks.  Since onset it is gradually improving. Additional comments: 7 Week AMD F/U OU, poss Eylea OD  Pt reports improving VA OD. No new symptoms reported OU.       Last edited by Rockie Neighbours, [place] on 10/27/2020  1:57 PM. (History)      Referring physician: [name], MD [place],  [place] [address]  HISTORICAL INFORMATION:   Selected notes from the [place]: No current outpatient medications on file. (Ophthalmic Drugs)   No current facility-administered medications for this visit. (Ophthalmic Drugs)   Current Outpatient Medications (Other)  Medication Sig  . albuterol (VENTOLIN HFA) 108 (90 Base) MCG/ACT inhaler Inhale 2 puffs into the lungs every 6 (six) hours as needed for wheezing or shortness of breath.  Marland Kitchen alendronate (FOSAMAX) 70 MG tablet Take 1 tablet by mouth once a week.  . ALPRAZolam (XANAX) 0.5 MG tablet Take 0.25-5 mg by mouth daily as needed for anxiety.   . fentaNYL (DURAGESIC - DOSED MCG/HR) 25 MCG/HR patch Place 25 mcg onto the skin every 3 (three) days.   Marland Kitchen HYDROcodone-acetaminophen (NORCO/VICODIN) 5-325 MG per tablet Take 1 tablet by mouth 2 (two) times daily as needed for moderate pain.   . nitroGLYCERIN (NITROSTAT) 0.4 MG SL tablet Place 1 tablet (0.4 mg total) under the tongue every 5 (five) minutes as needed for chest pain. (Patient not taking: Reported on 02/14/2020)  . omeprazole (PRILOSEC) 20 MG capsule Take 20 mg by mouth daily.  . predniSONE (DELTASONE) 20 MG tablet Take 10-20 mg by mouth as needed.    . sertraline (ZOLOFT) 50 MG tablet Take 1 tablet by mouth daily.  . simvastatin (ZOCOR) 20 MG tablet Take 20 mg by mouth daily.  . SYMBICORT 160-4.5 MCG/ACT inhaler Inhale 1 puff into the lungs 2 (two) times daily as needed.   Marland Kitchen tiZANidine (ZANAFLEX) 4 MG tablet Take 4 mg by mouth at bedtime as needed for muscle spasms.  Viviana Simpler ELLIPTA 100-62.5-25 MCG/INH AEPB SMARTSIG:1 Inhalation Via Inhaler Daily  . zolpidem (AMBIEN) 10 MG tablet Take 5-10 mg by mouth at bedtime.    No current facility-administered medications for this visit. (Other)      REVIEW OF SYSTEMS:    ALLERGIES Allergies  Allergen Reactions  . Aspirin   . Codeine   . Sulfa Antibiotics   . Other Rash    DURAGESIC/ANALGESICS - gel filled per patient     PAST MEDICAL HISTORY Past Medical History:  Diagnosis Date  . Asthma   . Chronic low back pain   . Depression   . Lobular carcinoma of left breast ([place]) 1997   in situ  . Other and unspecified hyperlipidemia   . Seasonal allergies    Past Surgical History:  Procedure Laterality Date  . CESAREAN SECTION  1986  . CHOLECYSTECTOMY      FAMILY HISTORY Family History  Problem Relation Age of Onset  . Osteoporosis Mother   .  Other Father        head trauma  . Breast cancer Sister        X's 2 sisters    SOCIAL HISTORY Social History   Tobacco Use  . Smoking status: Former Smoker    Packs/day: 0.50    Years: 20.00    Pack years: 10.00    Types: Cigarettes    Start date: 09/16/1964    Quit date: 10/05/1983    Years since quitting: 37.0  . Smokeless tobacco: Never Used         OPHTHALMIC EXAM:  Base Eye Exam    Visual Acuity (ETDRS)      Right Left   Dist cc 20/200 20/25 -1   Dist ph cc NI    Correction: Glasses       Tonometry (Tonopen, 1:57 PM)      Right Left   Pressure 17 18       Pupils      Pupils Dark Light Shape React APD   Right PERRL 5 4 Round Brisk None   Left PERRL 5 4 Round Brisk None       Visual Fields  (Counting fingers)      Left Right    Full Full       Extraocular Movement      Right Left    Full Full       Neuro/Psych    Oriented x3: Yes   Mood/Affect: Normal       Dilation    Both eyes: 1.0% Mydriacyl, 2.5% Phenylephrine @ 2:00 PM        Slit Lamp and Fundus Exam    External Exam      Right Left   External Normal Normal       Slit Lamp Exam      Right Left   Lids/Lashes Normal Normal   Conjunctiva/Sclera White and quiet White and quiet   Cornea Clear Clear   Anterior Chamber Deep and quiet Deep and quiet   Iris Round and reactive Round and reactive   Lens Posterior chamber intraocular lens Posterior chamber intraocular lens   Anterior Vitreous Normal Normal       Fundus Exam      Right Left   Posterior Vitreous Clear vitrectomized Normal   Disc Normal Normal   C/D Ratio 0.2 0.2   Macula Macular thickening, less Subretinal hemorrhage, Hard drusen, Retinal pigment epithelial rip.  And sub-RPE heme still persists, Subretinal hemorrhage, Retinal pigment epithelial detachment Early age related macular degeneration, no macular thickening, no microaneurysms, Hard drusen   Vessels Normal Normal   Periphery Normal Normal          IMAGING AND PROCEDURES  Imaging and Procedures for 10/27/20  OCT, Retina - OU - Both Eyes       Right Eye Quality was good. Scan locations included subfoveal. Central Foveal Thickness: 382. Progression has improved. Findings include abnormal foveal contour, outer retinal tubulation, subretinal hyper-reflective material, disciform scar, choroidal neovascular membrane, no IRF.   Left Eye Quality was good. Scan locations included subfoveal. Central Foveal Thickness: 310. Progression has been stable. Findings include retinal drusen , normal foveal contour, no IRF, no SRF.   Notes Much less active CNVM subfoveal OD, much less thickening, on Eylea currently at 7-week follow-up today.  We will repeat injection today and examination  again in 7 weeks       Intravitreal Injection, Pharmacologic Agent - OD - Right Eye  Time Out 10/27/2020. 2:37 PM. Confirmed correct patient, procedure, site, and patient consented.   Anesthesia Topical anesthesia was used. Anesthetic medications included Akten 3.5%.   Procedure Preparation included Tobramycin 0.3%, 10% betadine to eyelids, 5% betadine to ocular surface. A 30 gauge needle was used.   Injection:  2 mg aflibercept Alfonse Flavors) SOLN   NDC: [id number], Lot: [id number]   Route: Intravitreal, Site: Right Eye, Waste: 0 mg  Post-op Post injection exam found visual acuity of at least counting fingers. The patient tolerated the procedure well. There were no complications. The patient received written and verbal post procedure care education. Post injection medications were not given.                 ASSESSMENT/PLAN:  Exudative age-related macular degeneration of right eye with active choroidal neovascularization (HCC) The nature of wet macular degeneration was discussed with the patient.  Forms of therapy reviewed include the use of Anti-VEGF medications injected painlessly into the eye, as well as other possible treatment modalities, including thermal laser therapy. Fellow eye involvement and risks were discussed with the patient. Upon the finding of wet age related macular degeneration, treatment will be offered. The treatment regimen is on a treat as needed basis with the intent to treat if necessary and extend interval of exams when possible. On average 1 out of 6 patients do not need lifetime therapy. However, the risk of recurrent disease is high for a lifetime.  Initially monthly, then periodic, examinations and evaluations will determine whether the next treatment is required on the day of the examination.  OD, much less CNVM activity, on Eylea, at 7-week interval.  Repeat injection today and examination again in 7 weeks      ICD-10-CM   1. Exudative  age-related macular degeneration of right eye with active choroidal neovascularization (HCC)  H35.3211 OCT, Retina - OU - Both Eyes    Intravitreal Injection, Pharmacologic Agent - OD - Right Eye    aflibercept (EYLEA) SOLN 2 mg    1.  Anatomy of CNVM OD subfoveal has much improved as compared to 7 weeks previous.  We will repeat injection today intravitreal Eylea and repeat examination again in 7 weeks  2.  3.  Ophthalmic Meds Ordered this visit:  Meds ordered this encounter  Medications  . aflibercept (EYLEA) SOLN 2 mg       Return in about 7 weeks (around 12/15/2020) for dilate, OD, EYLEA OCT.  Patient Instructions  Patient instructed to contact the office promptly if new onset visual acuity declines or distortions in either eye    Explained the diagnoses, plan, and follow up with the patient and they expressed understanding.  Patient expressed understanding of the importance of proper follow up care.   Clent Demark Tiondra Fang M.D. Diseases & Surgery of the Retina and Vitreous [facility] [place] 10/27/20     Abbreviations: M myopia (nearsighted); A astigmatism; H hyperopia (farsighted); P presbyopia; Mrx spectacle prescription;  CTL contact lenses; OD right eye; OS left eye; OU both eyes  XT exotropia; ET esotropia; PEK punctate epithelial keratitis; PEE punctate epithelial erosions; DES dry eye syndrome; MGD meibomian gland dysfunction; ATs artificial tears; PFAT's preservative free artificial tears; [place] nuclear sclerotic cataract; PSC posterior subcapsular cataract; ERM epi-retinal membrane; PVD posterior vitreous detachment; RD retinal detachment; DM diabetes mellitus; DR diabetic retinopathy; NPDR non-proliferative diabetic retinopathy; PDR proliferative diabetic retinopathy; CSME clinically significant macular edema; DME diabetic macular edema; dbh dot blot hemorrhages; CWS cotton wool spot; POAG  primary open angle glaucoma; C/D cup-to-disc ratio; HVF humphrey visual  field; GVF goldmann visual field; OCT optical coherence tomography; IOP intraocular pressure; BRVO Branch retinal vein occlusion; CRVO central retinal vein occlusion; CRAO central retinal artery occlusion; BRAO branch retinal artery occlusion; RT retinal tear; SB scleral buckle; PPV pars plana vitrectomy; VH Vitreous hemorrhage; PRP panretinal laser photocoagulation; IVK intravitreal kenalog; VMT vitreomacular traction; MH Macular hole;  NVD neovascularization of the disc; NVE neovascularization elsewhere; AREDS age related eye disease study; ARMD age related macular degeneration; POAG primary open angle glaucoma; EBMD epithelial/anterior basement membrane dystrophy; ACIOL anterior chamber intraocular lens; IOL intraocular lens; PCIOL posterior chamber intraocular lens; Phaco/IOL phacoemulsification with intraocular lens placement; [place] photorefractive keratectomy; LASIK laser assisted in situ keratomileusis; HTN hypertension; DM diabetes mellitus; COPD chronic obstructive pulmonary disease

## 2020-10-27 NOTE — Patient Instructions (Signed)
Patient instructed to contact the office promptly if new onset visual acuity declines or distortions in either eye

## 2020-11-03 ENCOUNTER — Ambulatory Visit: Payer: Medicare Other | Admitting: Cardiology

## 2020-11-04 DIAGNOSIS — D509 Iron deficiency anemia, unspecified: Secondary | ICD-10-CM | POA: Diagnosis not present

## 2020-11-04 DIAGNOSIS — R059 Cough, unspecified: Secondary | ICD-10-CM | POA: Diagnosis not present

## 2020-11-04 DIAGNOSIS — I10 Essential (primary) hypertension: Secondary | ICD-10-CM | POA: Diagnosis not present

## 2020-11-04 DIAGNOSIS — I700 Atherosclerosis of aorta: Secondary | ICD-10-CM | POA: Diagnosis not present

## 2020-11-04 DIAGNOSIS — Z299 Encounter for prophylactic measures, unspecified: Secondary | ICD-10-CM | POA: Diagnosis not present

## 2020-11-04 DIAGNOSIS — J449 Chronic obstructive pulmonary disease, unspecified: Secondary | ICD-10-CM | POA: Diagnosis not present

## 2020-11-05 ENCOUNTER — Encounter (INDEPENDENT_AMBULATORY_CARE_PROVIDER_SITE_OTHER): Payer: Self-pay

## 2020-11-05 ENCOUNTER — Telehealth (INDEPENDENT_AMBULATORY_CARE_PROVIDER_SITE_OTHER): Payer: Self-pay

## 2020-11-05 ENCOUNTER — Other Ambulatory Visit (INDEPENDENT_AMBULATORY_CARE_PROVIDER_SITE_OTHER): Payer: Self-pay

## 2020-11-05 ENCOUNTER — Ambulatory Visit (INDEPENDENT_AMBULATORY_CARE_PROVIDER_SITE_OTHER): Payer: Medicare Other | Admitting: Internal Medicine

## 2020-11-05 ENCOUNTER — Other Ambulatory Visit: Payer: Self-pay

## 2020-11-05 ENCOUNTER — Encounter (INDEPENDENT_AMBULATORY_CARE_PROVIDER_SITE_OTHER): Payer: Self-pay | Admitting: Internal Medicine

## 2020-11-05 DIAGNOSIS — K921 Melena: Secondary | ICD-10-CM

## 2020-11-05 DIAGNOSIS — D649 Anemia, unspecified: Secondary | ICD-10-CM | POA: Diagnosis not present

## 2020-11-05 DIAGNOSIS — D509 Iron deficiency anemia, unspecified: Secondary | ICD-10-CM

## 2020-11-05 DIAGNOSIS — Z1211 Encounter for screening for malignant neoplasm of colon: Secondary | ICD-10-CM

## 2020-11-05 MED ORDER — NA SULFATE-K SULFATE-MG SULF 17.5-3.13-1.6 GM/177ML PO SOLN: 354.0000 mL | Freq: Once | ORAL | 0 refills | Status: AC

## 2020-11-05 NOTE — Telephone Encounter (Signed)
Alexa Nichols, CMA  

## 2020-11-05 NOTE — Progress Notes (Signed)
Reason for consultation  Anemia.  History of present illness  Patient is 75 year old Caucasian female who is referred through courtesy of Dr. Woody Seller for GI evaluation. Patient states she began to feel weak back in March 2021 after she took first dose of Covid vaccine.  She did not have any other symptoms.  Over the next few months or weakness progressed then she also noted mild exertional dyspnea.  She felt her symptoms are due to COPD.  Her weakness and exertional dyspnea got worse.  She was seen by Dr. Woody Seller in late December 2021 and noted to be very pale.  Her hemoglobin was 5.6 g.  MCV was low.  She received 2 units of PRBCs. Patient says she received another unit of PRBCs earlier today.  She does not know how low her hemoglobin was. She has chronic GERD.  She states omeprazole has worked very well.  She has been on it for several years.  She denies nausea vomiting dysphagia abdominal pain or rectal bleeding.  Lately her stools have been very dark.  She also had noted change to her odor.  Patient states over the weekend she developed body aches and cough and she also had 2 episodes of emesis but she felt normal 2 days ago.  She has chronic back pain but she does not take OTC NSAIDs.  She has been on alendronate for past few years. Patient has received both doses of Covid vaccine as well as booster.  She received posterior in December 2021. She had normal screening colonoscopy by Dr. Keturah Barre. [place] 5 years ago.   Current Medications: Outpatient Encounter Medications as of 11/05/2020  Medication Sig  . ALPRAZolam (XANAX) 0.5 MG tablet Take 0.25-5 mg by mouth daily as needed for anxiety.   . fentaNYL (DURAGESIC - DOSED MCG/HR) 25 MCG/HR patch Place 25 mcg onto the skin every 3 (three) days.   Marland Kitchen HYDROcodone-acetaminophen (NORCO/VICODIN) 5-325 MG per tablet Take 1 tablet by mouth 2 (two) times daily as needed for moderate pain.   Marland Kitchen omeprazole (PRILOSEC) 20 MG capsule Take 20 mg by mouth daily.  .  predniSONE (DELTASONE) 20 MG tablet Take 10-20 mg by mouth as needed.   . sertraline (ZOLOFT) 50 MG tablet Take 1 tablet by mouth daily.  . simvastatin (ZOCOR) 20 MG tablet Take 20 mg by mouth daily.  Marland Kitchen tiZANidine (ZANAFLEX) 4 MG tablet Take 4 mg by mouth at bedtime as needed for muscle spasms.  Viviana Simpler ELLIPTA 100-62.5-25 MCG/INH AEPB SMARTSIG:1 Inhalation Via Inhaler Daily  . [DISCONTINUED] alendronate (FOSAMAX) 70 MG tablet Take 1 tablet by mouth once a week.  . [DISCONTINUED] albuterol (VENTOLIN HFA) 108 (90 Base) MCG/ACT inhaler Inhale 2 puffs into the lungs every 6 (six) hours as needed for wheezing or shortness of breath.  . [DISCONTINUED] nitroGLYCERIN (NITROSTAT) 0.4 MG SL tablet Place 1 tablet (0.4 mg total) under the tongue every 5 (five) minutes as needed for chest pain. (Patient not taking: Reported on 02/14/2020)  . [DISCONTINUED] SYMBICORT 160-4.5 MCG/ACT inhaler Inhale 1 puff into the lungs 2 (two) times daily as needed.   . [DISCONTINUED] zolpidem (AMBIEN) 10 MG tablet Take 5-10 mg by mouth at bedtime.    No facility-administered encounter medications on file as of 11/05/2020.   Past medical history  Chronic GERD Osteoporosis COPD Patient is on allergy shots once a month Chronic low back pain History of heart murmur Hyperlipidemia Depression Macular degeneration involving the right eye.  She is receiving injection every 7 weeks. Normal  screening colonoscopy in January 2017 by Dr. Valentino Saxon  Allergies  Allergies  Allergen Reactions  . Aspirin   . Codeine   . Sulfa Antibiotics   . Other Rash    DURAGESIC/ANALGESICS - gel filled per patient     Family history  Both parents are disease.  She does not have any information about father's health history. Mother lived to be 60. She has 2 sisters.  Her sister age 91 is in good health.  Middle sister died of breast carcinoma within 6 months of diagnosis at age 73. She is status 1 maternal uncle had colon carcinoma  and maternal aunt died of esophageal cancer.  Social history  She is married.  She has 1 son age 80 in good health.  She smokes cigarettes for 15 years and quit at age 89.  She smoked less than 1 pack of cigarettes per day.  She does not drink alcohol.  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  Physical examination  Blood pressure (!) 144/78, pulse 85, temperature 99.5 F (37.5 C), temperature source Oral, height 4\' 10"  (1.473 m), weight 142 lb (64.4 kg). Patient is alert and in no acute distress. She is wearing a mask. Conjunctiva is pink. Sclera is nonicteric Oropharyngeal mucosa is normal. No neck masses or thyromegaly noted. Cardiac exam with regular rhythm normal S1 and S2.  Faint systolic murmur noted at aortic area. Lungs are clear to auscultation. Abdomen is full.  3 scars noted in right subcostal region.  Bowel sounds are normal.  No bruit noted.  On palpation abdomen is soft and nontender.  Liver edge is easily palpable at soft and nontender. Rectal examination reveals normal sphincter tone.  She has formed stool in the rectum.  Stool is black and strongly heme positive. No clubbing koilonychia or LE edema noted.  Labs/studies Results:  Hemoglobin 5.6 on September 25, 2020 with MCV of 71.  Patient had CBC, iron TIBC ferritin by Dr. Woody Seller yesterday but results are not available at the time of this dictation.   Assessment:  #1.  Microcytic anemia most likely secondary to iron deficiency anemia resulting from GI blood loss.  Her stool is black and strongly guaiac positive.  Iron studies are pending.  She is on alendronate which can result in injury to mucosa of GI tract.  She does not have any symptoms suggest peptic ulcer disease.  She has chronic GERD and low-dose PPI with satisfactory symptom control.  Need to rule out GI angiodysplasia or occult neoplasm.  She did have screening colonoscopy 5 years ago and was within normal  limits.  #2.  GERD.  Heartburn well controlled with low-dose omeprazole.   Recommendations  Discontinue alendronate for now. No aspirin or other NSAIDs. Patient advised to report to emergency room if she develops frank melena or rectal bleeding and/or if she has postural symptoms. Diagnostic esophagogastroduodenoscopy and colonoscopy under monitored anesthesia care and near future.  Will check H&H prior to the studies. Will consider p.o. iron once GI evaluation completed.

## 2020-11-05 NOTE — Patient Instructions (Addendum)
Esophagogastroduodenoscopy and colonoscopy to be scheduled in near future. If you experience frank melena and lightheadedness please report to emergency room. Please do not take Fosamax until work-up completed

## 2020-11-07 ENCOUNTER — Encounter (INDEPENDENT_AMBULATORY_CARE_PROVIDER_SITE_OTHER): Payer: Self-pay

## 2020-11-11 ENCOUNTER — Inpatient Hospital Stay (HOSPITAL_COMMUNITY): Admission: RE | Admit: 2020-11-11 | Payer: Medicare Other | Source: Ambulatory Visit

## 2020-11-11 ENCOUNTER — Other Ambulatory Visit (HOSPITAL_COMMUNITY): Payer: Medicare Other

## 2020-11-12 ENCOUNTER — Encounter (INDEPENDENT_AMBULATORY_CARE_PROVIDER_SITE_OTHER): Payer: Self-pay

## 2020-11-12 ENCOUNTER — Encounter (HOSPITAL_COMMUNITY): Admission: RE | Payer: Self-pay | Source: Home / Self Care

## 2020-11-12 ENCOUNTER — Ambulatory Visit (HOSPITAL_COMMUNITY): Admission: RE | Admit: 2020-11-12 | Payer: Medicare Other | Source: Home / Self Care | Admitting: Internal Medicine

## 2020-11-12 SURGERY — COLONOSCOPY WITH PROPOFOL
Anesthesia: Monitor Anesthesia Care

## 2020-11-17 DIAGNOSIS — Z7189 Other specified counseling: Secondary | ICD-10-CM | POA: Diagnosis not present

## 2020-11-17 DIAGNOSIS — Z299 Encounter for prophylactic measures, unspecified: Secondary | ICD-10-CM | POA: Diagnosis not present

## 2020-11-17 DIAGNOSIS — R5383 Other fatigue: Secondary | ICD-10-CM | POA: Diagnosis not present

## 2020-11-17 DIAGNOSIS — E785 Hyperlipidemia, unspecified: Secondary | ICD-10-CM | POA: Diagnosis not present

## 2020-11-17 DIAGNOSIS — Z0000 Encounter for general adult medical examination without abnormal findings: Secondary | ICD-10-CM | POA: Diagnosis not present

## 2020-11-17 DIAGNOSIS — Z1331 Encounter for screening for depression: Secondary | ICD-10-CM | POA: Diagnosis not present

## 2020-11-17 DIAGNOSIS — Z79899 Other long term (current) drug therapy: Secondary | ICD-10-CM | POA: Diagnosis not present

## 2020-11-17 DIAGNOSIS — M545 Low back pain, unspecified: Secondary | ICD-10-CM | POA: Diagnosis not present

## 2020-11-17 DIAGNOSIS — Z1339 Encounter for screening examination for other mental health and behavioral disorders: Secondary | ICD-10-CM | POA: Diagnosis not present

## 2020-11-17 DIAGNOSIS — I10 Essential (primary) hypertension: Secondary | ICD-10-CM | POA: Diagnosis not present

## 2020-11-17 DIAGNOSIS — Z6830 Body mass index (BMI) 30.0-30.9, adult: Secondary | ICD-10-CM | POA: Diagnosis not present

## 2020-11-17 DIAGNOSIS — F419 Anxiety disorder, unspecified: Secondary | ICD-10-CM | POA: Diagnosis not present

## 2020-11-19 DIAGNOSIS — J3089 Other allergic rhinitis: Secondary | ICD-10-CM | POA: Diagnosis not present

## 2020-11-28 NOTE — Patient Instructions (Signed)
Alexa Nichols  11/28/2020     @PREFPERIOPPHARMACY @   Your procedure is scheduled on  12/03/2020.   Report to [facility] at  [phone]  A.M.   Call this number if you have problems the morning of surgery:  [phone]   Remember:  Follow the diet and prep instructions given to you by the office.                        Take these medicines the morning of surgery with A SIP OF WATER  Xanax(if needed), hydrocodone (if needed), prilosec, prednisone, zoloft.   Use your inhaler before you come. Bring your rescue inhaler with you.    Please brush your teeth.  Do not wear jewelry, make-up or nail polish.  Do not wear lotions, powders, or perfumes, or deodorant.  Do not shave 48 hours prior to surgery.  Men may shave face and neck.  Do not bring valuables to the hospital.  [facility] is not responsible for any belongings or valuables.  Contacts, dentures or bridgework may not be worn into surgery.  Leave your suitcase in the car.  After surgery it may be brought to your room.  For patients admitted to the hospital, discharge time will be determined by your treatment team.  Patients discharged the day of surgery will not be allowed to drive home and must have someone with them for 24 hours.   Special instructions:   DO NOT smoke tobacco or vape the morning of your procedure.  Please read over the following fact sheets that you were given. Anesthesia Post-op Instructions and Care and Recovery After Surgery       Upper Endoscopy, Adult, Care After This sheet gives you information about how to care for yourself after your procedure. Your health care provider may also give you more specific instructions. If you have problems or questions, contact your health care provider. What can I expect after the procedure? After the procedure, it is common to have:  A sore throat.  Mild stomach pain or discomfort.  Bloating.  Nausea. Follow these instructions at  home:  Follow instructions from your health care provider about what to eat or drink after your procedure.  Return to your normal activities as told by your health care provider. Ask your health care provider what activities are safe for you.  Take over-the-counter and prescription medicines only as told by your health care provider.  If you were given a sedative during the procedure, it can affect you for several hours. Do not drive or operate machinery until your health care provider says that it is safe.  Keep all follow-up visits as told by your health care provider. This is important.   Contact a health care provider if you have:  A sore throat that lasts longer than one day.  Trouble swallowing. Get help right away if:  You vomit blood or your vomit looks like coffee grounds.  You have: ? A fever. ? Bloody, black, or tarry stools. ? A severe sore throat or you cannot swallow. ? Difficulty breathing. ? Severe pain in your chest or abdomen. Summary  After the procedure, it is common to have a sore throat, mild stomach discomfort, bloating, and nausea.  If you were given a sedative during the procedure, it can affect you for several hours. Do not drive or operate machinery until your health care provider says that it is safe.  Follow instructions from your health care provider about what to eat or drink after your procedure.  Return to your normal activities as told by your health care provider. This information is not intended to replace advice given to you by your health care provider. Make sure you discuss any questions you have with your health care provider. Document Revised: 09/18/2019 Document Reviewed: 02/20/2018 Elsevier Patient Education  2021 [employer].  Colonoscopy, Adult, Care After This sheet gives you information about how to care for yourself after your procedure. Your health care provider may also give you more specific instructions. If you have  problems or questions, contact your health care provider. What can I expect after the procedure? After the procedure, it is common to have:  A small amount of blood in your stool for 24 hours after the procedure.  Some gas.  Mild cramping or bloating of your abdomen. Follow these instructions at home: Eating and drinking  Drink enough fluid to keep your urine pale yellow.  Follow instructions from your health care provider about eating or drinking restrictions.  Resume your normal diet as instructed by your health care provider. Avoid heavy or fried foods that are hard to digest.   Activity  Rest as told by your health care provider.  Avoid sitting for a long time without moving. Get up to take short walks every 1-2 hours. This is important to improve blood flow and breathing. Ask for help if you feel weak or unsteady.  Return to your normal activities as told by your health care provider. Ask your health care provider what activities are safe for you. Managing cramping and bloating  Try walking around when you have cramps or feel bloated.  Apply heat to your abdomen as told by your health care provider. Use the heat source that your health care provider recommends, such as a moist heat pack or a heating pad. ? Place a towel between your skin and the heat source. ? Leave the heat on for 20-30 minutes. ? Remove the heat if your skin turns bright red. This is especially important if you are unable to feel pain, heat, or cold. You may have a greater risk of getting burned.   General instructions  If you were given a sedative during the procedure, it can affect you for several hours. Do not drive or operate machinery until your health care provider says that it is safe.  For the first 24 hours after the procedure: ? Do not sign important documents. ? Do not drink alcohol. ? Do your regular daily activities at a slower pace than normal. ? Eat soft foods that are easy to  digest.  Take over-the-counter and prescription medicines only as told by your health care provider.  Keep all follow-up visits as told by your health care provider. This is important. Contact a health care provider if:  You have blood in your stool 2-3 days after the procedure. Get help right away if you have:  More than a small spotting of blood in your stool.  Large blood clots in your stool.  Swelling of your abdomen.  Nausea or vomiting.  A fever.  Increasing pain in your abdomen that is not relieved with medicine. Summary  After the procedure, it is common to have a small amount of blood in your stool. You may also have mild cramping and bloating of your abdomen.  If you were given a sedative during the procedure, it can affect you for several hours.  Do not drive or operate machinery until your health care provider says that it is safe.  Get help right away if you have a lot of blood in your stool, nausea or vomiting, a fever, or increased pain in your abdomen. This information is not intended to replace advice given to you by your health care provider. Make sure you discuss any questions you have with your health care provider. Document Revised: 09/14/2019 Document Reviewed: 04/16/2019 Elsevier Patient Education  2021 [place] After This sheet gives you information about how to care for yourself after your procedure. Your health care provider may also give you more specific instructions. If you have problems or questions, contact your health care provider. What can I expect after the procedure? After the procedure, it is common to have:  Tiredness.  Forgetfulness about what happened after the procedure.  Impaired judgment for important decisions.  Nausea or vomiting.  Some difficulty with balance. Follow these instructions at home: For the time period you were told by your health care provider:  Rest as needed.  Do not  participate in activities where you could fall or become injured.  Do not drive or use machinery.  Do not drink alcohol.  Do not take sleeping pills or medicines that cause drowsiness.  Do not make important decisions or sign legal documents.  Do not take care of children on your own.      Eating and drinking  Follow the diet that is recommended by your health care provider.  Drink enough fluid to keep your urine pale yellow.  If you vomit: ? Drink water, juice, or soup when you can drink without vomiting. ? Make sure you have little or no nausea before eating solid foods. General instructions  Have a responsible adult stay with you for the time you are told. It is important to have someone help care for you until you are awake and alert.  Take over-the-counter and prescription medicines only as told by your health care provider.  If you have sleep apnea, surgery and certain medicines can increase your risk for breathing problems. Follow instructions from your health care provider about wearing your sleep device: ? Anytime you are sleeping, including during daytime naps. ? While taking prescription pain medicines, sleeping medicines, or medicines that make you drowsy.  Avoid smoking.  Keep all follow-up visits as told by your health care provider. This is important. Contact a health care provider if:  You keep feeling nauseous or you keep vomiting.  You feel light-headed.  You are still sleepy or having trouble with balance after 24 hours.  You develop a rash.  You have a fever.  You have redness or swelling around the IV site. Get help right away if:  You have trouble breathing.  You have new-onset confusion at home. Summary  For several hours after your procedure, you may feel tired. You may also be forgetful and have poor judgment.  Have a responsible adult stay with you for the time you are told. It is important to have someone help care for you until you  are awake and alert.  Rest as told. Do not drive or operate machinery. Do not drink alcohol or take sleeping pills.  Get help right away if you have trouble breathing, or if you suddenly become confused. This information is not intended to replace advice given to you by your health care provider. Make sure you discuss any questions you have with your health  care provider. Document Revised: 06/05/2020 Document Reviewed: 08/23/2019 Elsevier Patient Education  2021 [employer].

## 2020-12-01 ENCOUNTER — Other Ambulatory Visit (HOSPITAL_COMMUNITY)
Admission: RE | Admit: 2020-12-01 | Discharge: 2020-12-01 | Disposition: A | Discharge status: Home / Self Care | Payer: Medicare Other | Source: Ambulatory Visit | Attending: Internal Medicine | Admitting: Internal Medicine

## 2020-12-01 ENCOUNTER — Other Ambulatory Visit: Payer: Self-pay

## 2020-12-01 ENCOUNTER — Encounter (HOSPITAL_COMMUNITY)
Admission: RE | Admit: 2020-12-01 | Discharge: 2020-12-01 | Disposition: A | Discharge status: Home / Self Care | Payer: Medicare Other | Source: Ambulatory Visit | Attending: Internal Medicine | Admitting: Internal Medicine

## 2020-12-01 ENCOUNTER — Encounter (HOSPITAL_COMMUNITY): Payer: Self-pay

## 2020-12-01 DIAGNOSIS — Z01812 Encounter for preprocedural laboratory examination: Secondary | ICD-10-CM | POA: Diagnosis not present

## 2020-12-01 DIAGNOSIS — D509 Iron deficiency anemia, unspecified: Secondary | ICD-10-CM | POA: Diagnosis not present

## 2020-12-01 DIAGNOSIS — K921 Melena: Secondary | ICD-10-CM | POA: Diagnosis not present

## 2020-12-01 DIAGNOSIS — Z20822 Contact with and (suspected) exposure to covid-19: Secondary | ICD-10-CM | POA: Diagnosis not present

## 2020-12-01 HISTORY — DX: Gastro-esophageal reflux disease without esophagitis: K21.9

## 2020-12-01 HISTORY — DX: Cardiac murmur, unspecified: R01.1

## 2020-12-01 HISTORY — DX: Chronic obstructive pulmonary disease, unspecified: J44.9

## 2020-12-01 LAB — CBC WITH DIFFERENTIAL/PLATELET
Abs Immature Granulocytes: 0.06 10*3/uL (ref 0.00–0.07)
Basophils Absolute: 0.1 10*3/uL (ref 0.0–0.1)
Basophils Relative: 1 %
Eosinophils Absolute: 0.8 10*3/uL — ABNORMAL HIGH (ref 0.0–0.5)
Eosinophils Relative: 9 %
HCT: 32.2 % — ABNORMAL LOW (ref 36.0–46.0)
Hemoglobin: 10 g/dL — ABNORMAL LOW (ref 12.0–15.0)
Immature Granulocytes: 1 %
Lymphocytes Relative: 28 %
Lymphs Abs: 2.5 10*3/uL (ref 0.7–4.0)
MCH: 26.3 pg (ref 26.0–34.0)
MCHC: 31.1 g/dL (ref 30.0–36.0)
MCV: 84.7 fL (ref 80.0–100.0)
Monocytes Absolute: 0.7 10*3/uL (ref 0.1–1.0)
Monocytes Relative: 8 %
Neutro Abs: 5.1 10*3/uL (ref 1.7–7.7)
Neutrophils Relative %: 53 %
Platelets: 358 10*3/uL (ref 150–400)
RBC: 3.8 MIL/uL — ABNORMAL LOW (ref 3.87–5.11)
RDW: 23.4 % — ABNORMAL HIGH (ref 11.5–15.5)
WBC: 9.2 10*3/uL (ref 4.0–10.5)
nRBC: 0 % (ref 0.0–0.2)

## 2020-12-01 LAB — BASIC METABOLIC PANEL
Anion gap: 10 (ref 5–15)
BUN: 10 mg/dL (ref 8–23)
CO2: 27 mmol/L (ref 22–32)
Calcium: 9.3 mg/dL (ref 8.9–10.3)
Chloride: 100 mmol/L (ref 98–111)
Creatinine, Ser: 0.49 mg/dL (ref 0.44–1.00)
GFR, Estimated: >60 mL/min (ref >60)
Glucose, Bld: 107 mg/dL — ABNORMAL HIGH (ref 70–99)
Potassium: 4.1 mmol/L (ref 3.5–5.1)
Sodium: 137 mmol/L (ref 135–145)

## 2020-12-02 LAB — SARS CORONAVIRUS 2 (TAT 6-24 HRS): SARS Coronavirus 2: NEGATIVE

## 2020-12-03 ENCOUNTER — Ambulatory Visit (HOSPITAL_COMMUNITY): Payer: Medicare Other | Admitting: Anesthesiology

## 2020-12-03 ENCOUNTER — Encounter (HOSPITAL_COMMUNITY): Payer: Self-pay | Admitting: Internal Medicine

## 2020-12-03 ENCOUNTER — Telehealth (INDEPENDENT_AMBULATORY_CARE_PROVIDER_SITE_OTHER): Payer: Self-pay | Admitting: *Deleted

## 2020-12-03 ENCOUNTER — Encounter (HOSPITAL_COMMUNITY)
Admission: RE | Disposition: A | Discharge status: Home / Self Care | Payer: Self-pay | Source: Home / Self Care | Attending: Internal Medicine

## 2020-12-03 ENCOUNTER — Ambulatory Visit (HOSPITAL_COMMUNITY)
Admission: RE | Admit: 2020-12-03 | Discharge: 2020-12-03 | Disposition: A | Discharge status: Home / Self Care | Payer: Medicare Other | Attending: Internal Medicine | Admitting: Internal Medicine

## 2020-12-03 DIAGNOSIS — J449 Chronic obstructive pulmonary disease, unspecified: Secondary | ICD-10-CM | POA: Diagnosis not present

## 2020-12-03 DIAGNOSIS — K5730 Diverticulosis of large intestine without perforation or abscess without bleeding: Secondary | ICD-10-CM | POA: Diagnosis not present

## 2020-12-03 DIAGNOSIS — D120 Benign neoplasm of cecum: Secondary | ICD-10-CM | POA: Diagnosis not present

## 2020-12-03 DIAGNOSIS — K449 Diaphragmatic hernia without obstruction or gangrene: Secondary | ICD-10-CM | POA: Diagnosis not present

## 2020-12-03 DIAGNOSIS — K2289 Other specified disease of esophagus: Secondary | ICD-10-CM | POA: Diagnosis not present

## 2020-12-03 DIAGNOSIS — Z853 Personal history of malignant neoplasm of breast: Secondary | ICD-10-CM | POA: Diagnosis not present

## 2020-12-03 DIAGNOSIS — Z885 Allergy status to narcotic agent status: Secondary | ICD-10-CM | POA: Diagnosis not present

## 2020-12-03 DIAGNOSIS — Z79899 Other long term (current) drug therapy: Secondary | ICD-10-CM | POA: Diagnosis not present

## 2020-12-03 DIAGNOSIS — Z7982 Long term (current) use of aspirin: Secondary | ICD-10-CM | POA: Insufficient documentation

## 2020-12-03 DIAGNOSIS — D123 Benign neoplasm of transverse colon: Secondary | ICD-10-CM | POA: Insufficient documentation

## 2020-12-03 DIAGNOSIS — Z803 Family history of malignant neoplasm of breast: Secondary | ICD-10-CM | POA: Insufficient documentation

## 2020-12-03 DIAGNOSIS — K644 Residual hemorrhoidal skin tags: Secondary | ICD-10-CM | POA: Diagnosis not present

## 2020-12-03 DIAGNOSIS — Z7952 Long term (current) use of systemic steroids: Secondary | ICD-10-CM | POA: Insufficient documentation

## 2020-12-03 DIAGNOSIS — Z886 Allergy status to analgesic agent status: Secondary | ICD-10-CM | POA: Diagnosis not present

## 2020-12-03 DIAGNOSIS — K219 Gastro-esophageal reflux disease without esophagitis: Secondary | ICD-10-CM | POA: Diagnosis not present

## 2020-12-03 DIAGNOSIS — D500 Iron deficiency anemia secondary to blood loss (chronic): Secondary | ICD-10-CM | POA: Insufficient documentation

## 2020-12-03 DIAGNOSIS — D509 Iron deficiency anemia, unspecified: Secondary | ICD-10-CM

## 2020-12-03 DIAGNOSIS — Z8262 Family history of osteoporosis: Secondary | ICD-10-CM | POA: Diagnosis not present

## 2020-12-03 DIAGNOSIS — Z888 Allergy status to other drugs, medicaments and biological substances status: Secondary | ICD-10-CM | POA: Diagnosis not present

## 2020-12-03 DIAGNOSIS — K317 Polyp of stomach and duodenum: Secondary | ICD-10-CM | POA: Diagnosis not present

## 2020-12-03 DIAGNOSIS — K921 Melena: Secondary | ICD-10-CM | POA: Insufficient documentation

## 2020-12-03 DIAGNOSIS — Z87891 Personal history of nicotine dependence: Secondary | ICD-10-CM | POA: Insufficient documentation

## 2020-12-03 DIAGNOSIS — Z882 Allergy status to sulfonamides status: Secondary | ICD-10-CM | POA: Insufficient documentation

## 2020-12-03 DIAGNOSIS — Z791 Long term (current) use of non-steroidal anti-inflammatories (NSAID): Secondary | ICD-10-CM | POA: Insufficient documentation

## 2020-12-03 HISTORY — PX: ESOPHAGOGASTRODUODENOSCOPY (EGD) WITH PROPOFOL: SHX5813

## 2020-12-03 HISTORY — PX: COLONOSCOPY WITH PROPOFOL: SHX5780

## 2020-12-03 HISTORY — PX: BIOPSY: SHX5522

## 2020-12-03 SURGERY — COLONOSCOPY WITH PROPOFOL
Anesthesia: General

## 2020-12-03 MED ORDER — GLYCOPYRROLATE 0.2 MG/ML IJ SOLN
INTRAMUSCULAR | Status: AC
  Filled 2020-12-03: qty 1

## 2020-12-03 MED ORDER — CHLORHEXIDINE GLUCONATE CLOTH 2 % EX PADS: 6.0000 | MEDICATED_PAD | Freq: Once | CUTANEOUS | Status: DC

## 2020-12-03 MED ORDER — PROPOFOL 10 MG/ML IV BOLUS
INTRAVENOUS | Status: DC | PRN
  Administered 2020-12-03: 30 mg via INTRAVENOUS
  Administered 2020-12-03: 50 mg via INTRAVENOUS
  Administered 2020-12-03 (×2): 20 mg via INTRAVENOUS

## 2020-12-03 MED ORDER — LACTATED RINGERS IV SOLN: INTRAVENOUS | Status: DC

## 2020-12-03 MED ORDER — GLYCOPYRROLATE 0.2 MG/ML IJ SOLN
0.2000 mg | Freq: Once | INTRAMUSCULAR | Status: AC
  Administered 2020-12-03: 0.2 mg via INTRAVENOUS

## 2020-12-03 MED ORDER — LIDOCAINE VISCOUS HCL 2 % MT SOLN
OROMUCOSAL | Status: AC
  Filled 2020-12-03: qty 15

## 2020-12-03 MED ORDER — PROPOFOL 500 MG/50ML IV EMUL
INTRAVENOUS | Status: DC | PRN
  Administered 2020-12-03: 150 ug/kg/min via INTRAVENOUS

## 2020-12-03 MED ORDER — LIDOCAINE VISCOUS HCL 2 % MT SOLN
15.0000 mL | Freq: Once | OROMUCOSAL | Status: AC
  Administered 2020-12-03: 15 mL via OROMUCOSAL

## 2020-12-03 MED ORDER — PROPOFOL 10 MG/ML IV BOLUS
INTRAVENOUS | Status: AC
  Filled 2020-12-03: qty 60

## 2020-12-03 MED ORDER — STERILE WATER FOR IRRIGATION IR SOLN
Status: DC | PRN
  Administered 2020-12-03: 200 mL

## 2020-12-03 NOTE — Transfer of Care (Signed)
Immediate Anesthesia Transfer of Care Note  Patient: Alexa Nichols  Procedure(s) Performed: COLONOSCOPY WITH PROPOFOL (N/A ) ESOPHAGOGASTRODUODENOSCOPY (EGD) WITH PROPOFOL (N/A ) BIOPSY  Patient Location: Short Stay  Anesthesia Type:General  Level of Consciousness: awake  Airway & Oxygen Therapy: Patient Spontanous Breathing  Post-op Assessment: Report given to RN  Post vital signs: Reviewed and stable  Last Vitals:  Vitals Value Taken Time  BP    Temp    Pulse    Resp    SpO2      Last Pain:  Vitals:   12/03/20 0713  TempSrc: Oral  PainSc: 0-No pain      Patients Stated Pain Goal: 6 (16/10/96 0454)  Complications: No complications documented.

## 2020-12-03 NOTE — H&P (Signed)
Alexa Nichols is an 75 y.o. female.   Chief Complaint: Patient is here for esophagogastroduodenoscopy and colonoscopy. HPI: Patient is 75 year old Caucasian female who has chronic GERD maintained on PPI who was found to have profound anemia with hemoglobin of 5.6 back in December 2021 by Dr. Woody Seller.  She has received total of 3 units of PRBCs.  Patient was seen in the office on 11/05/2020.  She was noted to have tarry stool.  It was strongly guaiac positive.  Patient says heartburn is well controlled with PPI.  She denies dysphagia abdominal pain or rectal bleeding.  She has been on alendronate which was stopped at the time of office visit.  She has a history of migraine and may take OTC NSAIDs as needed basis but not in the last 1 month. Please refer to my consult note for rest of details. Patient had blood work 3 days ago and hemoglobin is up to 10 g.  Past Medical History:  Diagnosis Date  . Asthma   . Chronic low back pain   . COPD (chronic obstructive pulmonary disease) ([place])   . Depression   . GERD (gastroesophageal reflux disease)   .  Mitral regurgitation   . Lobular carcinoma of left breast ([place]) 1997   in situ  . Other and unspecified hyperlipidemia   . Seasonal allergies     Past Surgical History:  Procedure Laterality Date  . BREAST SURGERY Left    lumpectomy   . CESAREAN SECTION  1986  . CHOLECYSTECTOMY    . TONSILLECTOMY     age 35    Family History  Problem Relation Age of Onset  . Osteoporosis Mother   . Other Father        head trauma  . Breast cancer Sister        X's 2 sisters   Social History:  reports that she quit smoking about 37 years ago. Her smoking use included cigarettes. She started smoking about 56 years ago. She has a 10.00 pack-year smoking history. She has never used smokeless tobacco. She reports that she does not drink alcohol and does not use drugs.  Allergies:  Allergies  Allergen Reactions  . Aspirin     samter's syndrome  . Codeine  Itching  . Sulfa Antibiotics     Unknown reaction  . Other Rash    gel filled fentanyl patch, adhesive on that patch caused ithcing    Medications Prior to Admission  Medication Sig Dispense Refill  . acetaminophen (TYLENOL) 500 MG tablet Take 500 mg by mouth every 6 (six) hours as needed for moderate pain or headache.    . ALPRAZolam (XANAX) 0.5 MG tablet Take 0.25 mg by mouth daily as needed for anxiety.    Marland Kitchen aspirin-acetaminophen-caffeine (EXCEDRIN MIGRAINE) 250-250-65 MG tablet Take 1 tablet by mouth every 6 (six) hours as needed for headache.    . fentaNYL (DURAGESIC - DOSED MCG/HR) 25 MCG/HR patch Place 25 mcg onto the skin See admin instructions. Place 25 mcg onto the skin for 72 hours then remove as needed for pain    . HYDROcodone-acetaminophen (NORCO/VICODIN) 5-325 MG per tablet Take 1 tablet by mouth 2 (two) times daily as needed for moderate pain.     Marland Kitchen ibuprofen (ADVIL) 200 MG tablet Take 200 mg by mouth every 6 (six) hours as needed for mild pain or headache.    . montelukast (SINGULAIR) 10 MG tablet Take 10 mg by mouth at bedtime.    . Multiple Vitamins-Minerals (PRESERVISION  AREDS 2) CAPS Take 1 capsule by mouth in the morning and at bedtime.    Marland Kitchen omeprazole (PRILOSEC) 20 MG capsule Take 20 mg by mouth daily.    . sertraline (ZOLOFT) 50 MG tablet Take 25 mg by mouth daily.    . simvastatin (ZOCOR) 20 MG tablet Take 20 mg by mouth daily.    Marland Kitchen tiZANidine (ZANAFLEX) 4 MG tablet Take 4 mg by mouth at bedtime as needed for muscle spasms.    . TRELEGY ELLIPTA 100-62.5-25 MCG/INH AEPB Inhale 1 puff into the lungs daily.    Marland Kitchen alendronate (FOSAMAX) 70 MG tablet Take 70 mg by mouth every Monday. Take with a full glass of water on an empty stomach.    . predniSONE (DELTASONE) 20 MG tablet Take 10-20 mg by mouth daily as needed (back swelling).      Results for orders placed or performed during the hospital encounter of 12/01/20 (from the past 48 hour(s))  CBC with  Differential/Platelet     Status: Abnormal   Collection Time: 12/01/20  1:12 PM  Result Value Ref Range   WBC 9.2 4.0 - 10.5 K/uL   RBC 3.80 (L) 3.87 - 5.11 MIL/uL   Hemoglobin 10.0 (L) 12.0 - 15.0 g/dL   HCT 32.2 (L) 36.0 - 46.0 %   MCV 84.7 80.0 - 100.0 fL   MCH 26.3 26.0 - 34.0 pg   MCHC 31.1 30.0 - 36.0 g/dL   RDW 23.4 (H) 11.5 - 15.5 %   Platelets 358 150 - 400 K/uL   nRBC 0.0 0.0 - 0.2 %   Neutrophils Relative % 53 %   Neutro Abs 5.1 1.7 - 7.7 K/uL   Lymphocytes Relative 28 %   Lymphs Abs 2.5 0.7 - 4.0 K/uL   Monocytes Relative 8 %   Monocytes Absolute 0.7 0.1 - 1.0 K/uL   Eosinophils Relative 9 %   Eosinophils Absolute 0.8 (H) 0.0 - 0.5 K/uL   Basophils Relative 1 %   Basophils Absolute 0.1 0.0 - 0.1 K/uL   Immature Granulocytes 1 %   Abs Immature Granulocytes 0.06 0.00 - 0.07 K/uL    Comment: Performed at [facility], [address], [place], [place] [address]   No results found.  Review of Systems  Blood pressure 126/67, pulse 80, temperature 98.7 F (37.1 C), temperature source Oral, height 4\' 10"  (1.473 m), weight 63.5 kg, SpO2 97 %. Physical Exam HENT:     Mouth/Throat:     Mouth: Mucous membranes are moist.  Eyes:     Conjunctiva/sclera: Conjunctivae normal.  Cardiovascular:     Rate and Rhythm: Normal rate and regular rhythm.     Heart sounds: Murmur heard.      Comments: Faint systolic murmur heard at aortic area as well as apex. Pulmonary:     Effort: Pulmonary effort is normal.     Breath sounds: Normal breath sounds.  Abdominal:     General: There is no distension.     Palpations: Abdomen is soft. There is no mass.     Tenderness: There is no abdominal tenderness.  Musculoskeletal:        General: No swelling.     Cervical back: Neck supple.  Lymphadenopathy:     Cervical: No cervical adenopathy.  Skin:    General: Skin is warm and dry.  Neurological:     Mental Status: She is alert.      Assessment/Plan  Melena and iron deficiency  anemia Diagnostic esophagogastroduodenoscopy and colonoscopy  Hildred Laser, MD 12/03/2020, 7:55 AM

## 2020-12-03 NOTE — Anesthesia Preprocedure Evaluation (Addendum)
Anesthesia Evaluation  Patient identified by MRN, date of birth, ID band Patient awake    Reviewed: Allergy & Precautions, NPO status , Patient's Chart, lab work & pertinent test results  History of Anesthesia Complications Negative for: history of anesthetic complications  Airway Mallampati: II  TM Distance: >3 FB Neck ROM: Full    Dental  (+) Dental Advisory Given Crowns :   Pulmonary asthma , COPD,  COPD inhaler, former smoker,    Pulmonary exam normal breath sounds clear to auscultation       Cardiovascular Exercise Tolerance: Good + Valvular Problems/Murmurs MR  Rhythm:Regular Rate:Normal + Systolic murmurs    Neuro/Psych PSYCHIATRIC DISORDERS Depression    GI/Hepatic Neg liver ROS, GERD  Medicated and Controlled,  Endo/Other  negative endocrine ROS  Renal/GU negative Renal ROS     Musculoskeletal  (+) Arthritis  (chronic low back pain),   Abdominal   Peds  Hematology  (+) anemia ,   Anesthesia Other Findings Left breast cancer Degenerative retinal drusen of right eye Early stage nonexudative age-related macular degeneration of right eye Exudative age-related macular degeneration of right eye with active choroidal neovascularization (HCC) History of vitrectomy.    Reproductive/Obstetrics negative OB ROS                            Anesthesia Physical Anesthesia Plan  ASA: II  Anesthesia Plan: General   Post-op Pain Management:    Induction: Intravenous  PONV Risk Score and Plan: Propofol infusion  Airway Management Planned: Nasal Cannula and Natural Airway  Additional Equipment:   Intra-op Plan:   Post-operative Plan:   Informed Consent: I have reviewed the patients History and Physical, chart, labs and discussed the procedure including the risks, benefits and alternatives for the proposed anesthesia with the patient or authorized representative who has indicated  his/her understanding and acceptance.     Dental advisory given  Plan Discussed with: CRNA and Surgeon  Anesthesia Plan Comments:         Anesthesia Quick Evaluation

## 2020-12-03 NOTE — Op Note (Signed)
[facility] Patient Name: Alexa Nichols Procedure Date: 12/03/2020 7:08 AM MRN: [id number] Date of Birth: 07/08/1946 Attending MD: Hildred Laser , MD CSN: [id number] Age: 75 Admit Type: Outpatient Procedure:                Upper GI endoscopy Indications:              Melena Providers:                Hildred Laser, MD, Lurline Del, RN, Randa Spike,                            Technician Referring MD:             [name], MD Medicines:                Propofol per Anesthesia Complications:            No immediate complications. Estimated Blood Loss:     Estimated blood loss was minimal. Procedure:                Pre-Anesthesia Assessment:                           - Prior to the procedure, a History and Physical                            was performed, and patient medications and                            allergies were reviewed. The patient's tolerance of                            previous anesthesia was also reviewed. The risks                            and benefits of the procedure and the sedation                            options and risks were discussed with the patient.                            All questions were answered, and informed consent                            was obtained. Prior Anticoagulants: The patient has                            taken no previous anticoagulant or antiplatelet                            agents except for NSAID medication. ASA Grade                            Assessment: II - A patient with mild systemic  disease. After reviewing the risks and benefits,                            the patient was deemed in satisfactory condition to                            undergo the procedure.                           After obtaining informed consent, the endoscope was                            passed under direct vision. Throughout the                            procedure, the patient's blood pressure, pulse,  and                            oxygen saturations were monitored continuously. The                            [id number]-[id number] ([id number]) scope was introduced through the                            mouth, and advanced to the third part of duodenum.                            The upper GI endoscopy was accomplished without                            difficulty. The patient tolerated the procedure                            well. Scope In: 8:06:42 AM Scope Out: 8:14:30 AM Total Procedure Duration: 0 hours 7 minutes 48 seconds  Findings:      The hypopharynx was normal.      The examined esophagus was normal.      The Z-line was irregular and was found 39 cm from the incisors.      Multiple 3 to 8 mm pedunculated and sessile polyps with no stigmata of       recent bleeding were found in the gastric fundus and in the gastric       body. Biopsies were taken with a cold forceps for histology. The       pathology specimen was placed into Bottle Number 1.      A small paraesophageal hernia was found.      The exam of the stomach was otherwise normal.      The duodenal bulb, second portion of the duodenum and third portion of       the duodenum were normal. Impression:               - Normal hypopharynx.                           - Normal esophagus.                           -  Z-line irregular, 39 cm from the incisors.                           - Multiple gastric polyps. Biopsied.                           - Small paraesophageal hernia                           - Normal duodenal bulb, second portion of the                            duodenum and third portion of the duodenum. Moderate Sedation:      Per Anesthesia Care Recommendation:           - Patient has a contact number available for                            emergencies. The signs and symptoms of potential                            delayed complications were discussed with the                            patient. Return to normal activities  tomorrow.                            Written discharge instructions were provided to the                            patient.                           - Resume previous diet today.                           - Continue present medications.                           - Await pathology results.                           - UGIS at a later date. Procedure Code(s):        --- Professional ---                           [phone], Esophagogastroduodenoscopy, flexible,                            transoral; with biopsy, single or multiple Diagnosis Code(s):        --- Professional ---                           K22.8, Other specified diseases of esophagus                           K31.7, Polyp of stomach and  duodenum                           K92.1, Melena (includes Hematochezia) CPT copyright 2019 [employer]. All rights reserved. The codes documented in this report are preliminary and upon coder review may  be revised to meet current compliance requirements. Hildred Laser, MD Hildred Laser, MD 12/03/2020 9:04:42 AM This report has been signed electronically. Number of Addenda: 0

## 2020-12-03 NOTE — Telephone Encounter (Signed)
Per TCS/EGD op notes - patient needs UGIS and GIVENS capsule

## 2020-12-03 NOTE — Op Note (Signed)
[facility] Patient Name: Alexa Nichols Procedure Date: 12/03/2020 8:16 AM MRN: [id number] Date of Birth: 15-Nov-1945 Attending MD: Hildred Laser , MD CSN: [id number] Age: 75 Admit Type: Outpatient Procedure:                Colonoscopy Indications:              Iron deficiency anemia secondary to chronic blood                            loss Providers:                Hildred Laser, MD, Lurline Del, RN, Randa Spike,                            Technician Referring MD:             [name], MD Medicines:                Propofol per Anesthesia Complications:            No immediate complications. Estimated Blood Loss:     Estimated blood loss was minimal. Procedure:                Pre-Anesthesia Assessment:                           - Prior to the procedure, a History and Physical                            was performed, and patient medications and                            allergies were reviewed. The patient's tolerance of                            previous anesthesia was also reviewed. The risks                            and benefits of the procedure and the sedation                            options and risks were discussed with the patient.                            All questions were answered, and informed consent                            was obtained. Prior Anticoagulants: The patient has                            taken no previous anticoagulant or antiplatelet                            agents except for NSAID medication. ASA Grade                            Assessment:  II - A patient with mild systemic                            disease. After reviewing the risks and benefits,                            the patient was deemed in satisfactory condition to                            undergo the procedure.                           After obtaining informed consent, the colonoscope                            was passed under direct vision. Throughout the                             procedure, the patient's blood pressure, pulse, and                            oxygen saturations were monitored continuously. The                            [id number]-[id number] ([id number]) scope was introduced through                            the anus and advanced to the the cecum, identified                            by appendiceal orifice and ileocecal valve. The                            colonoscopy was technically difficult and complex                            due to a redundant sigmoid colon. The patient                            tolerated the procedure well. The quality of the                            bowel preparation was good. The ileocecal valve,                            appendiceal orifice, and rectum were photographed. Scope In: 8:19:20 AM Scope Out: 8:43:31 AM Scope Withdrawal Time: 0 hours 13 minutes 44 seconds  Total Procedure Duration: 0 hours 24 minutes 11 seconds  Findings:      The perianal and digital rectal examinations were normal.      Two polyps were found in the cecum. The polyps were 4 to 7 mm in size.       These polyps were removed with a cold snare. Resection and retrieval       were  complete. The pathology specimen was placed into Bottle Number 3.      A small polyp was found in the cecum. The polyp was sessile. Biopsies       were taken with a cold forceps for histology. The pathology specimen was       placed into Bottle Number 3.      A 5 mm polyp was found in the hepatic flexure. The polyp was removed       with a cold snare. Resection and retrieval were complete.      The pathology specimen was placed into Bottle Number 2.      A single medium-mouthed diverticulum was found in the sigmoid colon.      External hemorrhoids were found during retroflexion. The hemorrhoids       were small. Impression:               - Two 4 to 7 mm polyps in the cecum, removed with a                            cold snare. Resected and retrieved.                            - One small polyp in the cecum. Biopsied.                           - One 5 mm polyp at the hepatic flexure, removed                            with a cold snare. Resected and retrieved.                           - Diverticulosis in the sigmoid colon.                           - External hemorrhoids. Moderate Sedation:      Per Anesthesia Care Recommendation:           - Patient has a contact number available for                            emergencies. The signs and symptoms of potential                            delayed complications were discussed with the                            patient. Return to normal activities tomorrow.                            Written discharge instructions were provided to the                            patient.                           - Resume previous diet today.                           -  Continue present medications.                           - No aspirin, ibuprofen, naproxen, or other                            non-steroidal anti-inflammatory drugs for 1 day.                           - Await pathology results.                           - Repeat colonoscopy for surveillance based on                            pathology results.                           - To visualize the small bowel, perform video                            capsule endoscopy at appointment to be scheduled. Procedure Code(s):        --- Professional ---                           [phone], Colonoscopy, flexible; with removal of                            tumor(s), polyp(s), or other lesion(s) by snare                            technique                           45380, 35, Colonoscopy, flexible; with biopsy,                            single or multiple Diagnosis Code(s):        --- Professional ---                           K63.5, Polyp of colon                           K64.4, Residual hemorrhoidal skin tags                           D50.0, Iron deficiency  anemia secondary to blood                            loss (chronic)                           K57.30, Diverticulosis of large intestine without                            perforation or abscess without bleeding CPT copyright 2019  American Medical Association. All rights reserved. The codes documented in this report are preliminary and upon coder review may  be revised to meet current compliance requirements. Hildred Laser, MD Hildred Laser, MD 12/03/2020 9:11:55 AM This report has been signed electronically. Number of Addenda: 0

## 2020-12-03 NOTE — Anesthesia Postprocedure Evaluation (Signed)
Anesthesia Post Note  Patient: Alexa Nichols  Procedure(s) Performed: COLONOSCOPY WITH PROPOFOL (N/A ) ESOPHAGOGASTRODUODENOSCOPY (EGD) WITH PROPOFOL (N/A ) BIOPSY  Patient location during evaluation: Short Stay Anesthesia Type: General Level of consciousness: awake and alert Pain management: pain level controlled Vital Signs Assessment: post-procedure vital signs reviewed and stable Respiratory status: spontaneous breathing Cardiovascular status: blood pressure returned to baseline and stable Postop Assessment: no apparent nausea or vomiting Anesthetic complications: no   No complications documented.   Last Vitals:  Vitals:   12/03/20 0713  BP: 126/67  Pulse: 80  Temp: 37.1 C  SpO2: 97%    Last Pain:  Vitals:   12/03/20 0713  TempSrc: Oral  PainSc: 0-No pain                 [facility] Divis

## 2020-12-03 NOTE — Discharge Instructions (Signed)
No aspirin or NSAIDs for 24 hours. Resume other medications as before including alendronate. Resume usual diet. No driving for 24 hours. Physician will call with biopsy results and further recommendations.   Colonoscopy, Adult, Care After This sheet gives you information about how to care for yourself after your procedure. Your doctor may also give you more specific instructions. If you have problems or questions, call your doctor. What can I expect after the procedure? After the procedure, it is common to have:  A small amount of blood in your poop (stool) for 24 hours.  Some gas.  Mild cramping or bloating in your belly (abdomen). Follow these instructions at home: Eating and drinking  Drink enough fluid to keep your pee (urine) pale yellow.  Follow instructions from your doctor about what you cannot eat or drink.  Return to your normal diet as told by your doctor. Avoid heavy or fried foods that are hard to digest.   Activity  Rest as told by your doctor.  Do not sit for a long time without moving. Get up to take short walks every 1-2 hours. This is important. Ask for help if you feel weak or unsteady.  Return to your normal activities as told by your doctor. Ask your doctor what activities are safe for you. To help cramping and bloating:  Try walking around.  Put heat on your belly as told by your doctor. Use the heat source that your doctor recommends, such as a moist heat pack or a heating pad. ? Put a towel between your skin and the heat source. ? Leave the heat on for 20-30 minutes. ? Remove the heat if your skin turns bright red. This is very important if you are unable to feel pain, heat, or cold. You may have a greater risk of getting burned.   General instructions  If you were given a medicine to help you relax (sedative) during your procedure, it can affect you for many hours. Do not drive or use machinery until your doctor says that it is safe.  For the first  24 hours after the procedure: ? Do not sign important documents. ? Do not drink alcohol. ? Do your daily activities more slowly than normal. ? Eat foods that are soft and easy to digest.  Take over-the-counter or prescription medicines only as told by your doctor.  Keep all follow-up visits as told by your doctor. This is important. Contact a doctor if:  You have blood in your poop 2-3 days after the procedure. Get help right away if:  You have more than a small amount of blood in your poop.  You see large clumps of tissue (blood clots) in your poop.  Your belly is swollen.  You feel like you may vomit (nauseous).  You vomit.  You have a fever.  You have belly pain that gets worse, and medicine does not help your pain. Summary  After the procedure, it is common to have a small amount of blood in your poop. You may also have mild cramping and bloating in your belly.  If you were given a medicine to help you relax (sedative) during your procedure, it can affect you for many hours. Do not drive or use machinery until your doctor says that it is safe.  Get help right away if you have a lot of blood in your poop, feel like you may vomit, have a fever, or have more belly pain. This information is not intended to replace  advice given to you by your health care provider. Make sure you discuss any questions you have with your health care provider. Document Revised: 07/27/2019 Document Reviewed: 04/16/2019 Elsevier Patient Education  2021 [employer].   Upper Endoscopy, Adult, Care After This sheet gives you information about how to care for yourself after your procedure. Your health care provider may also give you more specific instructions. If you have problems or questions, contact your health care provider. What can I expect after the procedure? After the procedure, it is common to have:  A sore throat.  Mild stomach pain or discomfort.  Bloating.  Nausea. Follow these  instructions at home:  Follow instructions from your health care provider about what to eat or drink after your procedure.  Return to your normal activities as told by your health care provider. Ask your health care provider what activities are safe for you.  Take over-the-counter and prescription medicines only as told by your health care provider.  If you were given a sedative during the procedure, it can affect you for several hours. Do not drive or operate machinery until your health care provider says that it is safe.  Keep all follow-up visits as told by your health care provider. This is important.   Contact a health care provider if you have:  A sore throat that lasts longer than one day.  Trouble swallowing. Get help right away if:  You vomit blood or your vomit looks like coffee grounds.  You have: ? A fever. ? Bloody, black, or tarry stools. ? A severe sore throat or you cannot swallow. ? Difficulty breathing. ? Severe pain in your chest or abdomen. Summary  After the procedure, it is common to have a sore throat, mild stomach discomfort, bloating, and nausea.  If you were given a sedative during the procedure, it can affect you for several hours. Do not drive or operate machinery until your health care provider says that it is safe.  Follow instructions from your health care provider about what to eat or drink after your procedure.  Return to your normal activities as told by your health care provider. This information is not intended to replace advice given to you by your health care provider. Make sure you discuss any questions you have with your health care provider. Document Revised: 09/18/2019 Document Reviewed: 02/20/2018 Elsevier Patient Education  2021 [employer].

## 2020-12-04 LAB — SURGICAL PATHOLOGY

## 2020-12-08 ENCOUNTER — Encounter (HOSPITAL_COMMUNITY): Payer: Self-pay | Admitting: Internal Medicine

## 2020-12-08 ENCOUNTER — Encounter (INDEPENDENT_AMBULATORY_CARE_PROVIDER_SITE_OTHER): Payer: Self-pay

## 2020-12-15 ENCOUNTER — Encounter (INDEPENDENT_AMBULATORY_CARE_PROVIDER_SITE_OTHER): Payer: Medicare Other | Admitting: Ophthalmology

## 2020-12-15 DIAGNOSIS — J3089 Other allergic rhinitis: Secondary | ICD-10-CM | POA: Diagnosis not present

## 2020-12-16 ENCOUNTER — Ambulatory Visit (HOSPITAL_COMMUNITY)
Admission: RE | Admit: 2020-12-16 | Discharge: 2020-12-16 | Disposition: A | Discharge status: Home / Self Care | Payer: Medicare Other | Attending: Internal Medicine | Admitting: Internal Medicine

## 2020-12-16 ENCOUNTER — Encounter (HOSPITAL_COMMUNITY)
Admission: RE | Disposition: A | Discharge status: Home / Self Care | Payer: Self-pay | Source: Home / Self Care | Attending: Internal Medicine

## 2020-12-16 DIAGNOSIS — K30 Functional dyspepsia: Secondary | ICD-10-CM | POA: Diagnosis not present

## 2020-12-16 DIAGNOSIS — Z885 Allergy status to narcotic agent status: Secondary | ICD-10-CM | POA: Insufficient documentation

## 2020-12-16 DIAGNOSIS — Z882 Allergy status to sulfonamides status: Secondary | ICD-10-CM | POA: Diagnosis not present

## 2020-12-16 DIAGNOSIS — Z91048 Other nonmedicinal substance allergy status: Secondary | ICD-10-CM | POA: Diagnosis not present

## 2020-12-16 DIAGNOSIS — Z87891 Personal history of nicotine dependence: Secondary | ICD-10-CM | POA: Diagnosis not present

## 2020-12-16 DIAGNOSIS — R195 Other fecal abnormalities: Secondary | ICD-10-CM | POA: Insufficient documentation

## 2020-12-16 DIAGNOSIS — Z86000 Personal history of in-situ neoplasm of breast: Secondary | ICD-10-CM | POA: Insufficient documentation

## 2020-12-16 DIAGNOSIS — D500 Iron deficiency anemia secondary to blood loss (chronic): Secondary | ICD-10-CM | POA: Diagnosis not present

## 2020-12-16 HISTORY — PX: GIVENS CAPSULE STUDY: SHX5432

## 2020-12-16 SURGERY — IMAGING PROCEDURE, GI TRACT, INTRALUMINAL, VIA CAPSULE

## 2020-12-16 NOTE — H&P (Signed)
Alexa Nichols is an 75 y.o. female.   Chief Complaint: Patient is here for small bowel given capsule study HPI: Patient is 75 year old Caucasian female who was found to be profoundly anemic in December 2021 requiring transfusion.  Iron studies confirmed iron deficiency anemia.  She was seen in the office about 6 weeks ago and noted to have black heme positive stool.  She underwent EGD and colonoscopy earlier this month and no bleeding source was identified.  She is therefore returning for small bowel given capsule study looking for source of GI blood loss.  Past Medical History:  Diagnosis Date  . Asthma   . Chronic low back pain   . COPD (chronic obstructive pulmonary disease) ([place])   . Depression   . GERD (gastroesophageal reflux disease)   . Heart murmur   . Lobular carcinoma of left breast ([place]) 1997   in situ  . Other and unspecified hyperlipidemia   . Seasonal allergies     Past Surgical History:  Procedure Laterality Date  . BIOPSY  12/03/2020   Procedure: BIOPSY;  Surgeon: Rogene Houston, MD;  Location: [facility];  Service: Endoscopy;;  gastric polyps biopsies;  . BREAST SURGERY Left    lumpectomy   . CESAREAN SECTION  1986  . CHOLECYSTECTOMY    . COLONOSCOPY WITH PROPOFOL N/A 12/03/2020   Procedure: COLONOSCOPY WITH PROPOFOL;  Surgeon: Rogene Houston, MD;  Location: [facility];  Service: Endoscopy;  Laterality: N/A;  am  . ESOPHAGOGASTRODUODENOSCOPY (EGD) WITH PROPOFOL N/A 12/03/2020   Procedure: ESOPHAGOGASTRODUODENOSCOPY (EGD) WITH PROPOFOL;  Surgeon: Rogene Houston, MD;  Location: [facility];  Service: Endoscopy;  Laterality: N/A;  . TONSILLECTOMY     age 89    Family History  Problem Relation Age of Onset  . Osteoporosis Mother   . Other Father        head trauma  . Breast cancer Sister        X's 2 sisters   Social History:  reports that she quit smoking about 37 years ago. Her smoking use included cigarettes. She started smoking about 56 years ago.  She has a 10.00 pack-year smoking history. She has never used smokeless tobacco. She reports that she does not drink alcohol and does not use drugs.  Allergies:  Allergies  Allergen Reactions  . Aspirin     samter's syndrome  . Codeine Itching  . Sulfa Antibiotics     Unknown reaction  . Other Rash    gel filled fentanyl patch, adhesive on that patch caused ithcing    No medications prior to admission.    No results found for this or any previous visit (from the past 48 hour(s)). No results found.  Review of Systems  Height 4\' 10"  (1.473 m), weight 63 kg. Physical Exam   Assessment/Plan  Iron deficiency anemia secondary to GI blood loss. No bleeding source identified on EGD and colonoscopy Proceed with small bowel given capsule study  Hildred Laser, MD 12/16/2020, 9:02 AM

## 2020-12-18 ENCOUNTER — Encounter (HOSPITAL_COMMUNITY): Payer: Self-pay | Admitting: Internal Medicine

## 2020-12-18 ENCOUNTER — Other Ambulatory Visit: Payer: Self-pay

## 2020-12-18 ENCOUNTER — Encounter (INDEPENDENT_AMBULATORY_CARE_PROVIDER_SITE_OTHER): Payer: Medicare Other | Admitting: Ophthalmology

## 2020-12-18 ENCOUNTER — Ambulatory Visit (INDEPENDENT_AMBULATORY_CARE_PROVIDER_SITE_OTHER): Payer: Medicare Other | Admitting: Ophthalmology

## 2020-12-18 ENCOUNTER — Encounter (INDEPENDENT_AMBULATORY_CARE_PROVIDER_SITE_OTHER): Payer: Self-pay | Admitting: Internal Medicine

## 2020-12-18 DIAGNOSIS — H353122 Nonexudative age-related macular degeneration, left eye, intermediate dry stage: Secondary | ICD-10-CM | POA: Diagnosis not present

## 2020-12-18 DIAGNOSIS — H353211 Exudative age-related macular degeneration, right eye, with active choroidal neovascularization: Secondary | ICD-10-CM

## 2020-12-18 MED ORDER — AFLIBERCEPT 2MG/0.05ML IZ SOLN FOR KALEIDOSCOPE
2.0000 mg | INTRAVITREAL | Status: AC | PRN
  Administered 2020-12-18: 2 mg via INTRAVITREAL

## 2020-12-18 NOTE — Progress Notes (Signed)
12/18/2020     CHIEF COMPLAINT Patient presents for Retina Follow Up (7 Wk F/U OD, poss Eylea OD//Pt denies noticeable changes to [place] OU since last visit. Pt denies ocular pain, flashes of light, or floaters OU. //)   HISTORY OF PRESENT ILLNESS: Alexa Nichols is a 75 y.o. female who presents to the clinic today for:   HPI    Retina Follow Up    Patient presents with  Wet AMD.  In right eye.  This started 7 weeks ago.  Severity is mild.  Duration of 7 weeks.  Since onset it is stable. Additional comments: 7 Wk F/U OD, poss Eylea OD  Pt denies noticeable changes to [place] OU since last visit. Pt denies ocular pain, flashes of light, or floaters OU.          Last edited by Rockie Neighbours, [place] on 12/18/2020  3:39 PM. (History)      Referring physician: [name], MD [place],  [place] [address]  HISTORICAL INFORMATION:   Selected notes from the [facility]: No current outpatient medications on file. (Ophthalmic Drugs)   No current facility-administered medications for this visit. (Ophthalmic Drugs)   Current Outpatient Medications (Other)  Medication Sig  . acetaminophen (TYLENOL) 500 MG tablet Take 500 mg by mouth every 6 (six) hours as needed for moderate pain or headache.  . alendronate (FOSAMAX) 70 MG tablet Take 70 mg by mouth every Monday. Take with a full glass of water on an empty stomach.  . ALPRAZolam (XANAX) 0.5 MG tablet Take 0.25 mg by mouth daily as needed for anxiety.  Marland Kitchen aspirin-acetaminophen-caffeine (EXCEDRIN MIGRAINE) 250-250-65 MG tablet Take 1 tablet by mouth every 6 (six) hours as needed for headache.  . fentaNYL (DURAGESIC - DOSED MCG/HR) 25 MCG/HR patch Place 25 mcg onto the skin See admin instructions. Place 25 mcg onto the skin for 72 hours then remove as needed for pain  . HYDROcodone-acetaminophen (NORCO/VICODIN) 5-325 MG per tablet Take 1 tablet by mouth 2 (two) times daily as needed for moderate pain.   Marland Kitchen  ibuprofen (ADVIL) 200 MG tablet Take 200 mg by mouth every 6 (six) hours as needed for mild pain or headache.  . montelukast (SINGULAIR) 10 MG tablet Take 10 mg by mouth at bedtime.  . Multiple Vitamins-Minerals (PRESERVISION AREDS 2) CAPS Take 1 capsule by mouth in the morning and at bedtime.  Marland Kitchen omeprazole (PRILOSEC) 20 MG capsule Take 20 mg by mouth daily.  . predniSONE (DELTASONE) 20 MG tablet Take 10-20 mg by mouth daily as needed (back swelling).  . sertraline (ZOLOFT) 50 MG tablet Take 25 mg by mouth daily.  . simvastatin (ZOCOR) 20 MG tablet Take 20 mg by mouth daily.  Marland Kitchen tiZANidine (ZANAFLEX) 4 MG tablet Take 4 mg by mouth at bedtime as needed for muscle spasms.  . TRELEGY ELLIPTA 100-62.5-25 MCG/INH AEPB Inhale 1 puff into the lungs daily.   No current facility-administered medications for this visit. (Other)      REVIEW OF SYSTEMS:    ALLERGIES Allergies  Allergen Reactions  . Aspirin     samter's syndrome  . Codeine Itching  . Sulfa Antibiotics     Unknown reaction  . Other Rash    gel filled fentanyl patch, adhesive on that patch caused ithcing    PAST MEDICAL HISTORY Past Medical History:  Diagnosis Date  . Asthma   . Chronic low back pain   .  COPD (chronic obstructive pulmonary disease) ([place])   . Depression   . GERD (gastroesophageal reflux disease)   . Heart murmur   . Lobular carcinoma of left breast ([place]) 1997   in situ  . Other and unspecified hyperlipidemia   . Seasonal allergies    Past Surgical History:  Procedure Laterality Date  . BIOPSY  12/03/2020   Procedure: BIOPSY;  Surgeon: Rogene Houston, MD;  [facility];  Service: Endoscopy;;  gastric polyps biopsies;  . BREAST SURGERY Left    lumpectomy   . CESAREAN SECTION  1986  . CHOLECYSTECTOMY    . COLONOSCOPY WITH PROPOFOL N/A 12/03/2020   Procedure: COLONOSCOPY WITH PROPOFOL;  Surgeon: Rogene Houston, MD;  [facility];  Service: Endoscopy;  Laterality: N/A;  am  .  ESOPHAGOGASTRODUODENOSCOPY (EGD) WITH PROPOFOL N/A 12/03/2020   Procedure: ESOPHAGOGASTRODUODENOSCOPY (EGD) WITH PROPOFOL;  Surgeon: Rogene Houston, MD;  [facility];  Service: Endoscopy;  Laterality: N/A;  . GIVENS CAPSULE STUDY N/A 12/16/2020   Procedure: GIVENS CAPSULE STUDY;  Surgeon: Rogene Houston, MD;  [facility];  Service: Endoscopy;  Laterality: N/A;  AM  . TONSILLECTOMY     age 32    FAMILY HISTORY Family History  Problem Relation Age of Onset  . Osteoporosis Mother   . Other Father        head trauma  . Breast cancer Sister        X's 2 sisters    SOCIAL HISTORY Social History   Tobacco Use  . Smoking status: Former Smoker    Packs/day: 0.50    Years: 20.00    Pack years: 10.00    Types: Cigarettes    Start date: 09/16/1964    Quit date: 10/05/1983    Years since quitting: 37.2  . Smokeless tobacco: Never Used  Vaping Use  . Vaping Use: Never used  Substance Use Topics  . Alcohol use: Never    Alcohol/week: 0.0 standard drinks  . Drug use: Never         OPHTHALMIC EXAM:  Base Eye Exam    Visual Acuity (ETDRS)      Right Left   Dist cc 20/200 20/20 -2   Dist ph cc NI    Correction: Glasses       Tonometry (Tonopen, 3:39 PM)      Right Left   Pressure 16 14       Pupils      Dark Light Shape React APD   Right 5 4 Round Brisk None   Left 4.5 3.5 Round Brisk None       Visual Fields (Counting fingers)      Left Right    Full Full       Extraocular Movement      Right Left    Full Full       Neuro/Psych    Oriented x3: Yes   Mood/Affect: Normal       Dilation    Right eye: 1.0% Mydriacyl, 2.5% Phenylephrine @ 3:43 PM        Slit Lamp and Fundus Exam    External Exam      Right Left   External Normal Normal       Slit Lamp Exam      Right Left   Lids/Lashes Normal Normal   Conjunctiva/Sclera White and quiet White and quiet   Cornea Clear Clear   Anterior Chamber Deep and quiet Deep  and quiet    Iris Round and reactive Round and reactive   Lens Posterior chamber intraocular lens Posterior chamber intraocular lens   Anterior Vitreous Normal Normal       Fundus Exam      Right Left   Posterior Vitreous Clear vitrectomized    Disc Normal    C/D Ratio 0.2    Macula Macular thickening, less Subretinal hemorrhage, Hard drusen, Retinal pigment epithelial rip.  And sub-RPE heme still persists, Subretinal hemorrhage, Retinal pigment epithelial detachment    Vessels Normal    Periphery Normal           IMAGING AND PROCEDURES  Imaging and Procedures for 12/18/20  OCT, Retina - OU - Both Eyes       Right Eye Quality was good. Scan locations included subfoveal. Central Foveal Thickness: 400. Progression has improved. Findings include abnormal foveal contour, outer retinal tubulation, subretinal hyper-reflective material, disciform scar, choroidal neovascular membrane, no IRF.   Left Eye Quality was good. Scan locations included subfoveal. Central Foveal Thickness: 307. Progression has been stable. Findings include retinal drusen , normal foveal contour, no IRF, no SRF.   Notes Much less active CNVM subfoveal OD, much less thickening, on Eylea currently at 7-week follow-up today.  We will repeat injection today and examination again in 8 weeks       Intravitreal Injection, Pharmacologic Agent - OD - Right Eye       Time Out 12/18/2020. 4:05 PM. Confirmed correct patient, procedure, site, and patient consented.   Anesthesia Topical anesthesia was used. Anesthetic medications included Akten 3.5%.   Procedure Preparation included Tobramycin 0.3%, 10% betadine to eyelids, 5% betadine to ocular surface. A 30 gauge needle was used.   Injection:  2 mg aflibercept Alfonse Flavors) SOLN   NDC: [id number], Lot: [id number]   Route: Intravitreal, Site: Right Eye, Waste: 0 mg  Post-op Post injection exam found visual acuity of at least counting fingers. The patient tolerated the  procedure well. There were no complications. The patient received written and verbal post procedure care education. Post injection medications were not given.                 ASSESSMENT/PLAN:  Exudative age-related macular degeneration of right eye with active choroidal neovascularization (HCC) The nature of wet macular degeneration was discussed with the patient.  Forms of therapy reviewed include the use of Anti-VEGF medications injected painlessly into the eye, as well as other possible treatment modalities, including thermal laser therapy. Fellow eye involvement and risks were discussed with the patient. Upon the finding of wet age related macular degeneration, treatment will be offered. The treatment regimen is on a treat as needed basis with the intent to treat if necessary and extend interval of exams when possible. On average 1 out of 6 patients do not need lifetime therapy. However, the risk of recurrent disease is high for a lifetime.  Initially monthly, then periodic, examinations and evaluations will determine whether the next treatment is required on the day of the examination.  Center involved CNVM OD, no active growth at 7-week follow-up postnbEylea, will repeat injection today to maintain and prevent scotoma growth and examination next in 8 weeks  Intermediate stage nonexudative age-related macular degeneration of left eye The nature of age--related macular degeneration was discussed with the patient as well as the distinction between dry and wet types. Checking an Amsler Grid daily with advice to return immediately should a distortion develop, was given to the patient. The patient '  s smoking status now and in the past was determined and advice based on the AREDS study was provided regarding the consumption of antioxidant supplements. AREDS 2 vitamin formulation was recommended. Consumption of dark leafy vegetables and fresh fruits of various colors was recommended. Treatment  modalities for wet macular degeneration particularly the use of intravitreal injections of anti-blood vessel growth factors was discussed with the patient. Avastin, Lucentis, and Eylea are the available options. On occasion, therapy includes the use of photodynamic therapy and thermal laser. Stressed to the patient do not rub eyes.  Patient was advised to check Amsler Grid daily and return immediately if changes are noted. Instructions on using the grid were given to the patient. All patient questions were answered.  No signs of complications today observe      ICD-10-CM   1. Exudative age-related macular degeneration of right eye with active choroidal neovascularization (HCC)  H35.3211 OCT, Retina - OU - Both Eyes    Intravitreal Injection, Pharmacologic Agent - OD - Right Eye    aflibercept (EYLEA) SOLN 2 mg  2. Intermediate stage nonexudative age-related macular degeneration of left eye  H35.3122     1.  We will repeat intravitreal Eylea OD today.  I explained to the patient the visual outcome in the right eye is limited by the location of the subfoveal CNVM, wet AMD at the onset of disease.  Goal is to prevent enlargement of this diseased area and further vision loss while decreasing treatment burden  2.  No signs of CNVM OS  3.  Ophthalmic Meds Ordered this visit:  Meds ordered this encounter  Medications  . aflibercept (EYLEA) SOLN 2 mg       Return in about 8 weeks (around 02/12/2021) for dilate, OD, EYLEA OCT.  There are no Patient Instructions on file for this visit.   Explained the diagnoses, plan, and follow up with the patient and they expressed understanding.  Patient expressed understanding of the importance of proper follow up care.   Clent Demark Lyndsey Demos M.D. Diseases & Surgery of the Retina and Vitreous [facility]ina [facility] Diabetic [place] 12/18/20     Abbreviations: M myopia (nearsighted); A astigmatism; H hyperopia (farsighted); P presbyopia; Mrx spectacle prescription;   CTL contact lenses; OD right eye; OS left eye; OU both eyes  XT exotropia; ET esotropia; PEK punctate epithelial keratitis; PEE punctate epithelial erosions; DES dry eye syndrome; MGD meibomian gland dysfunction; ATs artificial tears; PFAT's preservative free artificial tears; [place] nuclear sclerotic cataract; PSC posterior subcapsular cataract; ERM epi-retinal membrane; PVD posterior vitreous detachment; RD retinal detachment; DM diabetes mellitus; DR diabetic retinopathy; NPDR non-proliferative diabetic retinopathy; PDR proliferative diabetic retinopathy; CSME clinically significant macular edema; DME diabetic macular edema; dbh dot blot hemorrhages; CWS cotton wool spot; POAG primary open angle glaucoma; C/D cup-to-disc ratio; HVF humphrey visual field; GVF goldmann visual field; OCT optical coherence tomography; IOP intraocular pressure; BRVO Branch retinal vein occlusion; CRVO central retinal vein occlusion; CRAO central retinal artery occlusion; BRAO branch retinal artery occlusion; RT retinal tear; SB scleral buckle; PPV pars plana vitrectomy; VH Vitreous hemorrhage; PRP panretinal laser photocoagulation; IVK intravitreal kenalog; VMT vitreomacular traction; MH Macular hole;  NVD neovascularization of the disc; NVE neovascularization elsewhere; AREDS age related eye disease study; ARMD age related macular degeneration; POAG primary open angle glaucoma; EBMD epithelial/anterior basement membrane dystrophy; ACIOL anterior chamber intraocular lens; IOL intraocular lens; PCIOL posterior chamber intraocular lens; Phaco/IOL phacoemulsification with intraocular lens placement; [place] photorefractive keratectomy; LASIK laser assisted in situ keratomileusis;  HTN hypertension; DM diabetes mellitus; COPD chronic obstructive pulmonary disease

## 2020-12-18 NOTE — Assessment & Plan Note (Signed)
The nature of age--related macular degeneration was discussed with the patient as well as the distinction between dry and wet types. Checking an Amsler Grid daily with advice to return immediately should a distortion develop, was given to the patient. The patient 's smoking status now and in the past was determined and advice based on the AREDS study was provided regarding the consumption of antioxidant supplements. AREDS 2 vitamin formulation was recommended. Consumption of dark leafy vegetables and fresh fruits of various colors was recommended. Treatment modalities for wet macular degeneration particularly the use of intravitreal injections of anti-blood vessel growth factors was discussed with the patient. Avastin, Lucentis, and Eylea are the available options. On occasion, therapy includes the use of photodynamic therapy and thermal laser. Stressed to the patient do not rub eyes.  Patient was advised to check Amsler Grid daily and return immediately if changes are noted. Instructions on using the grid were given to the patient. All patient questions were answered.  No signs of complications today observe

## 2020-12-18 NOTE — Assessment & Plan Note (Signed)
The nature of wet macular degeneration was discussed with the patient.  Forms of therapy reviewed include the use of Anti-VEGF medications injected painlessly into the eye, as well as other possible treatment modalities, including thermal laser therapy. Fellow eye involvement and risks were discussed with the patient. Upon the finding of wet age related macular degeneration, treatment will be offered. The treatment regimen is on a treat as needed basis with the intent to treat if necessary and extend interval of exams when possible. On average 1 out of 6 patients do not need lifetime therapy. However, the risk of recurrent disease is high for a lifetime.  Initially monthly, then periodic, examinations and evaluations will determine whether the next treatment is required on the day of the examination.  Center involved CNVM OD, no active growth at 7-week follow-up postnbEylea, will repeat injection today to maintain and prevent scotoma growth and examination next in 8 weeks

## 2020-12-18 NOTE — Op Note (Signed)
Small Bowel Givens Capsule Study Procedure date: December 16, 2020  Referring Provider: Jerene Bears, MD  Indication for procedure:   Patient is 75 year old Caucasian female who was found to have iron deficiency anemia requiring blood transfusion.  She was noted to have tarry heme positive stool.  She underwent esophagogastroduodenoscopy as well as colonoscopy about 2 weeks ago and no source of bleeding is identified.  She is therefore returning for small bowel given capsule study as she may be losing blood from her small bowel.    Findings:   Patient was able to swallow given capsule without any difficulty. No abnormality noted to gastric mucosa. No abnormality noted to small bowel mucosa but transit time was very rapid. Last few images revealed patient holding given capsule in her hand. Study duration 3 hours 28 minutes and 14 seconds.  First Gastric image: 24 sec First Duodenal image: 1 hour 45 min and 36 sec First Ileo-Cecal Valve image: 2 hours 6 min and 33 sec First Cecal image: 2-hour 7 min and 15 sec Gastric Passage time: 1 hour and 45 min Small Bowel Passage time: 21 min  Summary & Recommendations:  Normal small bowel given capsule study but very rapid transit time of 21 minutes. Prolonged gastric emptying time of 1 hour and 45 minutes but no abnormality noted to gastric mucosa. Results reviewed with patient over the phone. Patient advised to resume alendronate at prior dose. H&H and Hemoccult next week.

## 2020-12-24 NOTE — Telephone Encounter (Signed)
error    This encounter was created in error - please disregard.

## 2020-12-29 ENCOUNTER — Other Ambulatory Visit (INDEPENDENT_AMBULATORY_CARE_PROVIDER_SITE_OTHER): Payer: Self-pay | Admitting: *Deleted

## 2020-12-29 DIAGNOSIS — K921 Melena: Secondary | ICD-10-CM

## 2020-12-29 DIAGNOSIS — D509 Iron deficiency anemia, unspecified: Secondary | ICD-10-CM

## 2020-12-31 DIAGNOSIS — K921 Melena: Secondary | ICD-10-CM | POA: Diagnosis not present

## 2020-12-31 DIAGNOSIS — D509 Iron deficiency anemia, unspecified: Secondary | ICD-10-CM | POA: Diagnosis not present

## 2021-01-01 ENCOUNTER — Telehealth (INDEPENDENT_AMBULATORY_CARE_PROVIDER_SITE_OTHER): Payer: Self-pay | Admitting: *Deleted

## 2021-01-01 LAB — HEMOGLOBIN AND HEMATOCRIT, BLOOD
Hematocrit: 33.8 % — ABNORMAL LOW (ref 34.0–46.6)
Hemoglobin: 11 g/dL — ABNORMAL LOW (ref 11.1–15.9)

## 2021-01-01 NOTE — Telephone Encounter (Signed)
   Diagnosis:    Result(s)   Card 1: Positive:  Negative:                                    Unable to adequately read..    Card 2: Positive:  Negative:   Card 3: Positive:  Negative:    Completed by: Janes Colegrove,LPN   HEMOCCULT SENSA DEVELOPER: LOT#:  [id number] EXPIRATION DATE: 2022-10   HEMOCCULT SENSA CARD:  LOT#:  [id number] EXPIRATION DATE: 02/24   CARD CONTROL RESULTS:  POSITIVE: Positive NEGATIVE: Negative    ADDITIONAL COMMENTS: We are unable to adequate reading on this card. The stool was thick on the car There was one small area that looked blue. Per Dr. Laural Golden have the patient redo the card and not put as much stool on the card. Patient was called and made aware. A hemoccult card was mailed out to her per her request.

## 2021-01-02 ENCOUNTER — Other Ambulatory Visit (INDEPENDENT_AMBULATORY_CARE_PROVIDER_SITE_OTHER): Payer: Self-pay | Admitting: *Deleted

## 2021-01-02 ENCOUNTER — Other Ambulatory Visit: Payer: Self-pay

## 2021-01-02 ENCOUNTER — Ambulatory Visit (INDEPENDENT_AMBULATORY_CARE_PROVIDER_SITE_OTHER): Payer: Self-pay

## 2021-01-02 DIAGNOSIS — K921 Melena: Secondary | ICD-10-CM

## 2021-01-05 DIAGNOSIS — R439 Unspecified disturbances of smell and taste: Secondary | ICD-10-CM | POA: Diagnosis not present

## 2021-01-05 DIAGNOSIS — J3089 Other allergic rhinitis: Secondary | ICD-10-CM | POA: Diagnosis not present

## 2021-01-05 DIAGNOSIS — J324 Chronic pansinusitis: Secondary | ICD-10-CM | POA: Diagnosis not present

## 2021-01-05 DIAGNOSIS — B49 Unspecified mycosis: Secondary | ICD-10-CM | POA: Diagnosis not present

## 2021-01-06 ENCOUNTER — Telehealth (INDEPENDENT_AMBULATORY_CARE_PROVIDER_SITE_OTHER): Payer: Self-pay

## 2021-01-06 NOTE — Telephone Encounter (Signed)
   Diagnosis:    Result(s)   Card 1: Negative:           Completed by:    HEMOCCULT SENSA DEVELOPER:  HEK#:[id number]     EXPIRATION DATE: 07/2021   HEMOCCULT SENSA CARD:  LOT#: [id number]     EXPIRATION DATE: 11/2022   CARD CONTROL RESULTS:  POSITIVE:  + NEGATIVE: -    ADDITIONAL COMMENTS: Patient made aware her stool cards were negative for blood and that we would forward to Dr. Dereck Leep for further instruction.Patient states understanding.

## 2021-01-09 ENCOUNTER — Telehealth (INDEPENDENT_AMBULATORY_CARE_PROVIDER_SITE_OTHER): Payer: Self-pay

## 2021-01-09 ENCOUNTER — Ambulatory Visit (INDEPENDENT_AMBULATORY_CARE_PROVIDER_SITE_OTHER): Payer: Medicare Other

## 2021-01-26 DIAGNOSIS — K921 Melena: Secondary | ICD-10-CM | POA: Diagnosis not present

## 2021-01-27 ENCOUNTER — Other Ambulatory Visit (INDEPENDENT_AMBULATORY_CARE_PROVIDER_SITE_OTHER): Payer: Self-pay | Admitting: Internal Medicine

## 2021-01-27 DIAGNOSIS — D509 Iron deficiency anemia, unspecified: Secondary | ICD-10-CM

## 2021-01-27 LAB — CBC
Hematocrit: 28.9 % — ABNORMAL LOW (ref 34.0–46.6)
Hemoglobin: 8.7 g/dL — ABNORMAL LOW (ref 11.1–15.9)
MCH: 27 pg (ref 26.6–33.0)
MCHC: 30.1 g/dL — ABNORMAL LOW (ref 31.5–35.7)
MCV: 90 fL (ref 79–97)
Platelets: 382 10*3/uL (ref 150–450)
RBC: 3.22 x10E6/uL — ABNORMAL LOW (ref 3.77–5.28)
RDW: 13.6 % (ref 11.7–15.4)
WBC: 8.7 10*3/uL (ref 3.4–10.8)

## 2021-01-27 MED ORDER — FLINTSTONES COMPLETE 18 MG PO CHEW: 1.0000 | CHEWABLE_TABLET | Freq: Two times a day (BID) | ORAL | Status: DC

## 2021-02-02 ENCOUNTER — Other Ambulatory Visit (INDEPENDENT_AMBULATORY_CARE_PROVIDER_SITE_OTHER): Payer: Self-pay | Admitting: Internal Medicine

## 2021-02-02 DIAGNOSIS — D509 Iron deficiency anemia, unspecified: Secondary | ICD-10-CM | POA: Diagnosis not present

## 2021-02-03 ENCOUNTER — Other Ambulatory Visit: Payer: Self-pay

## 2021-02-03 ENCOUNTER — Encounter (HOSPITAL_COMMUNITY): Payer: Self-pay | Admitting: *Deleted

## 2021-02-03 ENCOUNTER — Inpatient Hospital Stay (HOSPITAL_COMMUNITY)
Admission: EM | Admit: 2021-02-03 | Discharge: 2021-02-07 | DRG: 378 | Disposition: A | Discharge status: Home / Self Care | Payer: Medicare Other | Attending: Internal Medicine | Admitting: Internal Medicine

## 2021-02-03 DIAGNOSIS — Z87891 Personal history of nicotine dependence: Secondary | ICD-10-CM

## 2021-02-03 DIAGNOSIS — D62 Acute posthemorrhagic anemia: Secondary | ICD-10-CM | POA: Diagnosis present

## 2021-02-03 DIAGNOSIS — Z853 Personal history of malignant neoplasm of breast: Secondary | ICD-10-CM

## 2021-02-03 DIAGNOSIS — Z886 Allergy status to analgesic agent status: Secondary | ICD-10-CM

## 2021-02-03 DIAGNOSIS — Z79899 Other long term (current) drug therapy: Secondary | ICD-10-CM

## 2021-02-03 DIAGNOSIS — K449 Diaphragmatic hernia without obstruction or gangrene: Secondary | ICD-10-CM | POA: Diagnosis present

## 2021-02-03 DIAGNOSIS — K219 Gastro-esophageal reflux disease without esophagitis: Secondary | ICD-10-CM | POA: Diagnosis present

## 2021-02-03 DIAGNOSIS — K5521 Angiodysplasia of colon with hemorrhage: Secondary | ICD-10-CM | POA: Diagnosis present

## 2021-02-03 DIAGNOSIS — E785 Hyperlipidemia, unspecified: Secondary | ICD-10-CM | POA: Diagnosis present

## 2021-02-03 DIAGNOSIS — K254 Chronic or unspecified gastric ulcer with hemorrhage: Secondary | ICD-10-CM | POA: Diagnosis not present

## 2021-02-03 DIAGNOSIS — F32A Depression, unspecified: Secondary | ICD-10-CM | POA: Diagnosis present

## 2021-02-03 DIAGNOSIS — K314 Gastric diverticulum: Secondary | ICD-10-CM | POA: Diagnosis not present

## 2021-02-03 DIAGNOSIS — J449 Chronic obstructive pulmonary disease, unspecified: Secondary | ICD-10-CM | POA: Diagnosis present

## 2021-02-03 DIAGNOSIS — D649 Anemia, unspecified: Secondary | ICD-10-CM

## 2021-02-03 DIAGNOSIS — Z882 Allergy status to sulfonamides status: Secondary | ICD-10-CM

## 2021-02-03 DIAGNOSIS — K317 Polyp of stomach and duodenum: Secondary | ICD-10-CM | POA: Diagnosis not present

## 2021-02-03 DIAGNOSIS — Z7951 Long term (current) use of inhaled steroids: Secondary | ICD-10-CM

## 2021-02-03 DIAGNOSIS — Z20822 Contact with and (suspected) exposure to covid-19: Secondary | ICD-10-CM | POA: Diagnosis not present

## 2021-02-03 DIAGNOSIS — Z91048 Other nonmedicinal substance allergy status: Secondary | ICD-10-CM

## 2021-02-03 DIAGNOSIS — Z885 Allergy status to narcotic agent status: Secondary | ICD-10-CM

## 2021-02-03 DIAGNOSIS — K31811 Angiodysplasia of stomach and duodenum with bleeding: Secondary | ICD-10-CM | POA: Diagnosis present

## 2021-02-03 DIAGNOSIS — Z803 Family history of malignant neoplasm of breast: Secondary | ICD-10-CM

## 2021-02-03 DIAGNOSIS — I340 Nonrheumatic mitral (valve) insufficiency: Secondary | ICD-10-CM | POA: Diagnosis present

## 2021-02-03 LAB — CBC WITH DIFFERENTIAL/PLATELET
Abs Immature Granulocytes: 0.07 10*3/uL (ref 0.00–0.07)
Basophils Absolute: 0.1 10*3/uL (ref 0.0–0.1)
Basophils Relative: 1 %
Eosinophils Absolute: 0.4 10*3/uL (ref 0.0–0.5)
Eosinophils Relative: 4 %
HCT: 23.8 % — ABNORMAL LOW (ref 36.0–46.0)
Hemoglobin: 6.9 g/dL — CL (ref 12.0–15.0)
Immature Granulocytes: 1 %
Lymphocytes Relative: 27 %
Lymphs Abs: 2.8 10*3/uL (ref 0.7–4.0)
MCH: 27.3 pg (ref 26.0–34.0)
MCHC: 29 g/dL — ABNORMAL LOW (ref 30.0–36.0)
MCV: 94.1 fL (ref 80.0–100.0)
Monocytes Absolute: 1 10*3/uL (ref 0.1–1.0)
Monocytes Relative: 10 %
Neutro Abs: 5.8 10*3/uL (ref 1.7–7.7)
Neutrophils Relative %: 57 %
Platelets: 372 10*3/uL (ref 150–400)
RBC: 2.53 MIL/uL — ABNORMAL LOW (ref 3.87–5.11)
RDW: 16.3 % — ABNORMAL HIGH (ref 11.5–15.5)
WBC: 10.1 10*3/uL (ref 4.0–10.5)
nRBC: 0.3 % — ABNORMAL HIGH (ref 0.0–0.2)

## 2021-02-03 LAB — BASIC METABOLIC PANEL
Anion gap: 7 (ref 5–15)
BUN: 13 mg/dL (ref 8–23)
CO2: 27 mmol/L (ref 22–32)
Calcium: 9.1 mg/dL (ref 8.9–10.3)
Chloride: 105 mmol/L (ref 98–111)
Creatinine, Ser: 0.45 mg/dL (ref 0.44–1.00)
GFR, Estimated: >60 mL/min (ref >60)
Glucose, Bld: 95 mg/dL (ref 70–99)
Potassium: 4.6 mmol/L (ref 3.5–5.1)
Sodium: 139 mmol/L (ref 135–145)

## 2021-02-03 LAB — HEMOGLOBIN: Hemoglobin: 7 g/dL — CL (ref 11.1–15.9)

## 2021-02-03 LAB — HEMATOCRIT: Hematocrit: 23.7 % — ABNORMAL LOW (ref 34.0–46.6)

## 2021-02-03 LAB — ABO/RH: ABO/RH(D): A NEG

## 2021-02-03 LAB — PREPARE RBC (CROSSMATCH)

## 2021-02-03 MED ORDER — ACETAMINOPHEN 650 MG RE SUPP: 650.0000 mg | Freq: Four times a day (QID) | RECTAL | Status: DC | PRN

## 2021-02-03 MED ORDER — TRAZODONE HCL 50 MG PO TABS
50.0000 mg | ORAL_TABLET | Freq: Every evening | ORAL | Status: DC | PRN
  Administered 2021-02-03 – 2021-02-06 (×4): 50 mg via ORAL
  Filled 2021-02-03 (×5): qty 1

## 2021-02-03 MED ORDER — UMECLIDINIUM BROMIDE 62.5 MCG/INH IN AEPB
1.0000 | INHALATION_SPRAY | Freq: Every day | RESPIRATORY_TRACT | Status: DC
  Administered 2021-02-04 – 2021-02-07 (×4): 1 via RESPIRATORY_TRACT
  Filled 2021-02-03: qty 7

## 2021-02-03 MED ORDER — SERTRALINE HCL 50 MG PO TABS
25.0000 mg | ORAL_TABLET | Freq: Every day | ORAL | Status: DC
  Administered 2021-02-04 – 2021-02-07 (×4): 25 mg via ORAL
  Filled 2021-02-03 (×4): qty 1

## 2021-02-03 MED ORDER — PANTOPRAZOLE SODIUM 40 MG IV SOLR: 40.0000 mg | INTRAVENOUS | Status: DC

## 2021-02-03 MED ORDER — SIMVASTATIN 20 MG PO TABS
20.0000 mg | ORAL_TABLET | Freq: Every day | ORAL | Status: DC
  Administered 2021-02-04 – 2021-02-07 (×4): 20 mg via ORAL
  Filled 2021-02-03 (×4): qty 1

## 2021-02-03 MED ORDER — SODIUM CHLORIDE 0.9 % IV SOLN
10.0000 mL/h | Freq: Once | INTRAVENOUS | Status: AC
  Administered 2021-02-03: 10 mL/h via INTRAVENOUS

## 2021-02-03 MED ORDER — PANTOPRAZOLE SODIUM 40 MG IV SOLR
40.0000 mg | Freq: Two times a day (BID) | INTRAVENOUS | Status: DC
  Administered 2021-02-04 – 2021-02-07 (×8): 40 mg via INTRAVENOUS
  Filled 2021-02-03 (×8): qty 40

## 2021-02-03 MED ORDER — MONTELUKAST SODIUM 10 MG PO TABS
10.0000 mg | ORAL_TABLET | Freq: Every day | ORAL | Status: DC
  Administered 2021-02-03 – 2021-02-06 (×4): 10 mg via ORAL
  Filled 2021-02-03 (×4): qty 1

## 2021-02-03 MED ORDER — ONDANSETRON HCL 4 MG PO TABS: 4.0000 mg | ORAL_TABLET | Freq: Four times a day (QID) | ORAL | Status: DC | PRN

## 2021-02-03 MED ORDER — ONDANSETRON HCL 4 MG/2ML IJ SOLN: 4.0000 mg | Freq: Four times a day (QID) | INTRAMUSCULAR | Status: DC | PRN

## 2021-02-03 MED ORDER — POLYETHYLENE GLYCOL 3350 17 G PO PACK: 17.0000 g | PACK | Freq: Every day | ORAL | Status: DC | PRN

## 2021-02-03 MED ORDER — FLUTICASONE-UMECLIDIN-VILANT 100-62.5-25 MCG/INH IN AEPB: 1.0000 | INHALATION_SPRAY | Freq: Every day | RESPIRATORY_TRACT | Status: DC

## 2021-02-03 MED ORDER — ACETAMINOPHEN 325 MG PO TABS
650.0000 mg | ORAL_TABLET | Freq: Four times a day (QID) | ORAL | Status: DC | PRN
  Administered 2021-02-07: 650 mg via ORAL
  Filled 2021-02-03: qty 2

## 2021-02-03 MED ORDER — FLUTICASONE FUROATE-VILANTEROL 100-25 MCG/INH IN AEPB
1.0000 | INHALATION_SPRAY | Freq: Every day | RESPIRATORY_TRACT | Status: DC
  Administered 2021-02-04 – 2021-02-07 (×4): 1 via RESPIRATORY_TRACT
  Filled 2021-02-03: qty 28

## 2021-02-03 NOTE — ED Notes (Signed)
Date and time results received: 02/03/21 2:33 PM Test: hemoglobin Critical Value: 6.9  Name of Provider Notified: [email or web address]  Orders Received? Or Actions Taken?: md notified

## 2021-02-03 NOTE — ED Provider Notes (Signed)
[facility] Provider Note   CSN: [id number] Arrival date & time: 02/03/21  1206     History No chief complaint on file.   Alexa Nichols is a 75 y.o. female with PMHx asthma, COPD, GERD, and microcytic anemia who presents to the ED today with complaints of low HGB level. Pt reports history of same for the past 4-5 months requiring 3 blood transfusions. She has been followed by GI Dr. Laural Golden with negative work up and they are unsure why pt continues to have lower hgb levels. She had blood work done last week at 8.7 and repeat this week at 7.0. She was advised to come to the ED for further evaluation and blood transfusion. Pt denies any new symptoms. Reports she has been dealing with chronic fatigue and SOB since requiring these blood transfusions.   The history is provided by the patient and medical records.       Past Medical History:  Diagnosis Date  . Asthma   . Chronic low back pain   . COPD (chronic obstructive pulmonary disease) ([place])   . Depression   . GERD (gastroesophageal reflux disease)   . Heart murmur   . Lobular carcinoma of left breast ([place]) 1997   in situ  . Other and unspecified hyperlipidemia   . Seasonal allergies     Patient Active Problem List   Diagnosis Date Noted  . Acute on chronic anemia 02/03/2021  . Intermediate stage nonexudative age-related macular degeneration of left eye 12/18/2020  . Microcytic anemia 11/05/2020  . Melena 11/05/2020  . Early stage nonexudative age-related macular degeneration of left eye 07/21/2020  . Degenerative retinal drusen of right eye 02/14/2020  . Early stage nonexudative age-related macular degeneration of right eye 02/14/2020  . Exudative age-related macular degeneration of right eye with active choroidal neovascularization ([place]) 02/14/2020  . History of vitrectomy 02/14/2020  . Chest pain 01/15/2014  . Mitral regurgitation 01/15/2014  . Hyperlipidemia 01/15/2014    Past Surgical History:   Procedure Laterality Date  . BIOPSY  12/03/2020   Procedure: BIOPSY;  Surgeon: Rogene Houston, MD;  [facility];  Service: Endoscopy;;  gastric polyps biopsies;  . BREAST SURGERY Left    lumpectomy   . CESAREAN SECTION  1986  . CHOLECYSTECTOMY    . COLONOSCOPY WITH PROPOFOL N/A 12/03/2020   Procedure: COLONOSCOPY WITH PROPOFOL;  Surgeon: Rogene Houston, MD;  [facility];  Service: Endoscopy;  Laterality: N/A;  am  . ESOPHAGOGASTRODUODENOSCOPY (EGD) WITH PROPOFOL N/A 12/03/2020   Procedure: ESOPHAGOGASTRODUODENOSCOPY (EGD) WITH PROPOFOL;  Surgeon: Rogene Houston, MD;  [facility];  Service: Endoscopy;  Laterality: N/A;  . GIVENS CAPSULE STUDY N/A 12/16/2020   Procedure: GIVENS CAPSULE STUDY;  Surgeon: Rogene Houston, MD;  [facility];  Service: Endoscopy;  Laterality: N/A;  AM  . TONSILLECTOMY     age 70     OB History   No obstetric history on file.     Family History  Problem Relation Age of Onset  . Osteoporosis Mother   . Other Father        head trauma  . Breast cancer Sister        X's 2 sisters    Social History   Tobacco Use  . Smoking status: Former Smoker    Packs/day: 0.50    Years: 20.00    Pack years: 10.00    Types: Cigarettes    Start date: 09/16/1964  Quit date: 10/05/1983    Years since quitting: 37.3  . Smokeless tobacco: Never Used  Vaping Use  . Vaping Use: Never used  Substance Use Topics  . Alcohol use: Never    Alcohol/week: 0.0 standard drinks  . Drug use: Never    Home Medications Prior to Admission medications   Medication Sig Start Date End Date Taking? Authorizing Provider  acetaminophen (TYLENOL) 500 MG tablet Take 500 mg by mouth every 6 (six) hours as needed for moderate pain or headache.    [provider]  ALPRAZolam Duanne Moron) 0.5 MG tablet Take 0.25 mg by mouth daily as needed for anxiety.    [provider]  fentaNYL (DURAGESIC - DOSED MCG/HR) 25 MCG/HR patch  Place 25 mcg onto the skin See admin instructions. Place 25 mcg onto the skin for 72 hours then remove as needed for pain    [provider]  HYDROcodone-acetaminophen (NORCO/VICODIN) 5-325 MG per tablet Take 1 tablet by mouth 2 (two) times daily as needed for moderate pain.     [provider]  ibuprofen (ADVIL) 200 MG tablet Take 200 mg by mouth every 6 (six) hours as needed for mild pain or headache.    [provider]  montelukast (SINGULAIR) 10 MG tablet Take 10 mg by mouth at bedtime.    [provider]  Multiple Vitamins-Minerals (PRESERVISION AREDS 2) CAPS Take 1 capsule by mouth in the morning and at bedtime.    [provider]  omeprazole (PRILOSEC) 20 MG capsule Take 20 mg by mouth daily.    [provider]  Pediatric Multivitamins-Iron (FLINTSTONES COMPLETE) 18 MG CHEW Chew 1 tablet by mouth 2 (two) times daily. 01/27/21   Rehman, Mechele Dawley, MD  predniSONE (DELTASONE) 20 MG tablet Take 10-20 mg by mouth daily as needed (back swelling).    [provider]  sertraline (ZOLOFT) 50 MG tablet Take 25 mg by mouth daily. 01/26/16   [provider]  simvastatin (ZOCOR) 20 MG tablet Take 20 mg by mouth daily.    [provider]  tiZANidine (ZANAFLEX) 4 MG tablet Take 4 mg by mouth at bedtime as needed for muscle spasms.    [provider]  TRELEGY ELLIPTA 100-62.5-25 MCG/INH AEPB Inhale 1 puff into the lungs daily. 01/23/20   [provider]    Allergies    Aspirin, Codeine, Sulfa antibiotics, and Other  Review of Systems   Review of Systems  Constitutional: Positive for fatigue (chronic). Negative for chills and fever.  Respiratory: Positive for shortness of breath (chronic).   Cardiovascular: Negative for chest pain.  Neurological: Negative for syncope.  All other systems reviewed and are negative.   Physical Exam Updated Vital Signs BP (!) 151/90 (BP Location: Left Arm)   Pulse 81   Temp  98.4 F (36.9 C) (Oral)   Resp 18   SpO2 100%   Physical Exam Vitals and nursing note reviewed.  Constitutional:      Appearance: She is not ill-appearing or diaphoretic.  HENT:     Head: Normocephalic and atraumatic.  Eyes:     Conjunctiva/sclera: Conjunctivae normal.  Cardiovascular:     Rate and Rhythm: Normal rate and regular rhythm.     Pulses: Normal pulses.  Pulmonary:     Effort: Pulmonary effort is normal.     Breath sounds: Normal breath sounds. No wheezing, rhonchi or rales.  Abdominal:     Palpations: Abdomen is soft.     Tenderness: There is no  abdominal tenderness.  Musculoskeletal:     Cervical back: Neck supple.  Skin:    General: Skin is warm and dry.  Neurological:     Mental Status: She is alert.     ED Results / Procedures / Treatments   Labs (all labs ordered are listed, but only abnormal results are displayed) Labs Reviewed  CBC WITH DIFFERENTIAL/PLATELET - Abnormal; Notable for the following components:      Result Value   RBC 2.53 (*)    Hemoglobin 6.9 (*)    HCT 23.8 (*)    MCHC 29.0 (*)    RDW 16.3 (*)    nRBC 0.3 (*)    All other components within normal limits  SARS CORONAVIRUS 2 (TAT 6-24 HRS)  BASIC METABOLIC PANEL  TYPE AND SCREEN  PREPARE RBC (CROSSMATCH)  ABO/RH    EKG None  Radiology No results found.  Procedures .Critical Care Performed by: [name], PA-C Authorized by: [name], PA-C   Critical care provider statement:    Critical care time (minutes):  45   Critical care was time spent personally by me on the following activities:  Discussions with consultants, evaluation of patient's response to treatment, examination of patient, ordering and performing treatments and interventions, ordering and review of laboratory studies, ordering and review of radiographic studies, pulse oximetry, re-evaluation of patient's condition, obtaining history from patient or surrogate and review of old charts      Medications Ordered in ED Medications  0.9 %  sodium chloride infusion (has no administration in time range)    ED Course  I have reviewed the triage vital signs and the nursing notes.  Pertinent labs & imaging results that were available during my care of the patient were reviewed by me and considered in my medical decision making (see chart for details).    MDM Rules/Calculators/A&P                          75 year old female who presents to the ED today with abnormal lab work of low hemoglobin 7.0.  Is been dealing with chronic microcytic anemia for the past several months requiring 3 units packed red blood cells.  Has been monitoring her blood levels with GI Dr. Melony Overly and was called and advised to come to the ED today.  On arrival to the ED vitals are stable.  Patient appears to be no acute distress.  She was medically screened by myself in triage and work-up initiated.  Hemoglobin today at 6.9.  Patient will require blood transfusion.  Given she has had negative colonoscopy and no new complaints of stool change I do not feel she requires a rectal exam today.   Nursing staff informed that patient has antibodies and it will take several hours to get the blood here for transfusion.  Have discussed this with patient and she is in agreement to be admitted for observation with blood transfusion  Discussed case with Dr. Denton Brick [facility] who agrees to accept patient for admission.   This note was prepared using Dragon voice recognition software and may include unintentional dictation errors due to the inherent limitations of voice recognition software.  Final Clinical Impression(s) / ED Diagnoses Final diagnoses:  Anemia, unspecified type    Rx / DC Orders ED Discharge Orders    None       [name], PA-C 02/03/21 1736    Lajean Saver, MD 02/04/21 1704

## 2021-02-03 NOTE — ED Triage Notes (Signed)
referred here for possible blood transfusion

## 2021-02-03 NOTE — Plan of Care (Signed)

## 2021-02-03 NOTE — ED Provider Notes (Signed)
Emergency Medicine Provider Triage Evaluation Note  Alexa Nichols , a 75 y.o. female  was evaluated in triage.  Pt complains of blood transfusion needed.  She reports that since December she has had dropping hemoglobins.  She required 2 units of blood December 23 and then again sometime in February.  She states that she is currently being followed by Dr. Laural Golden with GI with negative colonoscopy and endoscopy as well as swallow study.  Unsure why she is continuing to lose blood.  She had blood work done last week with a hemoglobin of 8.7.  Repeat yesterday with hemoglobin at 7.0.  Advised to come to the ED for further evaluation and for transfusion.  Patient reports chronic fatigue for the past several months as well as chronic shortness of breath.  No new symptoms at this time..  Review of Systems  Positive: + chronic fatigue Negative: - chest pain or new symptoms  Physical Exam  BP (!) 145/78   Pulse 85   Temp 98.8 F (37.1 C) (Oral)   Resp 18   SpO2 97%  Gen:   Awake, no distress   Resp:  Normal effort  MSK:   Moves extremities without difficulty    Medical Decision Making  Medically screening exam initiated at 1:10 PM.  Appropriate orders placed.  Alexa Nichols was informed that the remainder of the evaluation will be completed by another provider, this initial triage assessment does not replace that evaluation, and the importance of remaining in the ED until their evaluation is complete.  75 year old female here for blood transfusion.  Hemoglobin of 7.0 yesterday.  Has been followed by GI for same.  Arrival to the ED vital signs stable.  We will repeat lab work at this time as well as type and screen.  Patient will need transfusion.    Alexa Maize, PA-C 02/03/21 1312    Milton Ferguson, MD 02/04/21 1017

## 2021-02-03 NOTE — H&P (Signed)
History and Physical    Alexa Nichols WUJ:[id number] DOB: 1946/08/08 DOA: 02/03/2021  PCP: [name], MD   Patient coming from: Home  I have personally briefly reviewed patient's old medical records in [place]  Chief Complaint: Low hemoglobin  HPI: Alexa Nichols is a 75 y.o. female with medical history significant for COPD, asthma, mitral regurgitation. Patient presented to the ED with reports of low hemoglobin.  This has been ongoing for the past 4 to 5 months, and she has required 3 units of blood-2 in December and 1 in February both at [facility].  She follows with Dr. Laural Golden.  Blood work checked yesterday showed hemoglobin of 7, patient was referred to the ED.  She denies difficulty breathing, but she reports ongoing chronic fatigue. She also reports persistent black stools, no vomiting of blood, no abdominal pain.  She reports occasional ibuprofen use 200mg  about twice a month for arthritis involving her hands  ED Course: Stable vitals.  Hemoglobin 6.9.  1 units PRBC ordered for transfusion, but due to multiple anti-bodies, there will be a delay in getting blood, so patient's will be admitted pending blood transfusion.  Review of Systems: As per HPI all other systems reviewed and negative.  Past Medical History:  Diagnosis Date  . Asthma   . Chronic low back pain   . COPD (chronic obstructive pulmonary disease) ([place])   . Depression   . GERD (gastroesophageal reflux disease)   . Heart murmur   . Lobular carcinoma of left breast ([place]) 1997   in situ  . Other and unspecified hyperlipidemia   . Seasonal allergies     Past Surgical History:  Procedure Laterality Date  . BIOPSY  12/03/2020   Procedure: BIOPSY;  Surgeon: Rogene Houston, MD;  [facility];  Service: Endoscopy;;  gastric polyps biopsies;  . BREAST SURGERY Left    lumpectomy   . CESAREAN SECTION  1986  . CHOLECYSTECTOMY    . COLONOSCOPY WITH PROPOFOL N/A 12/03/2020   Procedure:  COLONOSCOPY WITH PROPOFOL;  Surgeon: Rogene Houston, MD;  [facility];  Service: Endoscopy;  Laterality: N/A;  am  . ESOPHAGOGASTRODUODENOSCOPY (EGD) WITH PROPOFOL N/A 12/03/2020   Procedure: ESOPHAGOGASTRODUODENOSCOPY (EGD) WITH PROPOFOL;  Surgeon: Rogene Houston, MD;  [facility];  Service: Endoscopy;  Laterality: N/A;  . GIVENS CAPSULE STUDY N/A 12/16/2020   Procedure: GIVENS CAPSULE STUDY;  Surgeon: Rogene Houston, MD;  [facility];  Service: Endoscopy;  Laterality: N/A;  AM  . TONSILLECTOMY     age 49     reports that she quit smoking about 37 years ago. Her smoking use included cigarettes. She started smoking about 56 years ago. She has a 10.00 pack-year smoking history. She has never used smokeless tobacco. She reports that she does not drink alcohol and does not use drugs.  Allergies  Allergen Reactions  . Aspirin     samter's syndrome  . Codeine Itching  . Sulfa Antibiotics     Unknown reaction  . Other Rash    gel filled fentanyl patch, adhesive on that patch caused ithcing    Family History  Problem Relation Age of Onset  . Osteoporosis Mother   . Other Father        head trauma  . Breast cancer Sister        X's 2 sisters    Prior to Admission medications   Medication Sig Start Date End Date Taking? Authorizing  Provider  acetaminophen (TYLENOL) 500 MG tablet Take 500 mg by mouth every 6 (six) hours as needed for moderate pain or headache.    [provider]  ALPRAZolam Duanne Moron) 0.5 MG tablet Take 0.25 mg by mouth daily as needed for anxiety.    [provider]  fentaNYL (DURAGESIC - DOSED MCG/HR) 25 MCG/HR patch Place 25 mcg onto the skin See admin instructions. Place 25 mcg onto the skin for 72 hours then remove as needed for pain    [provider]  HYDROcodone-acetaminophen (NORCO/VICODIN) 5-325 MG per tablet Take 1 tablet by mouth 2 (two) times daily as needed for moderate pain.     [provider]  ibuprofen (ADVIL) 200 MG tablet Take 200 mg by mouth every 6 (six) hours as needed for mild pain or headache.    [provider]  montelukast (SINGULAIR) 10 MG tablet Take 10 mg by mouth at bedtime.    [provider]  Multiple Vitamins-Minerals (PRESERVISION AREDS 2) CAPS Take 1 capsule by mouth in the morning and at bedtime.    [provider]  omeprazole (PRILOSEC) 20 MG capsule Take 20 mg by mouth daily.    [provider]  Pediatric Multivitamins-Iron (FLINTSTONES COMPLETE) 18 MG CHEW Chew 1 tablet by mouth 2 (two) times daily. 01/27/21   Rehman, Mechele Dawley, MD  predniSONE (DELTASONE) 20 MG tablet Take 10-20 mg by mouth daily as needed (back swelling).    [provider]  sertraline (ZOLOFT) 50 MG tablet Take 25 mg by mouth daily. 01/26/16   [provider]  simvastatin (ZOCOR) 20 MG tablet Take 20 mg by mouth daily.    [provider]  tiZANidine (ZANAFLEX) 4 MG tablet Take 4 mg by mouth at bedtime as needed for muscle spasms.    [provider]  TRELEGY ELLIPTA 100-62.5-25 MCG/INH AEPB Inhale 1 puff into the lungs daily. 01/23/20   [provider]    Physical Exam: Vitals:   02/03/21 1605 02/03/21 1758 02/03/21 1833 02/03/21 2140  BP: (!) 151/90 (!) 136/99  (!) 111/53  Pulse: 81 85 80 77  Resp: 18 16 20 18   Temp: 98.4 F (36.9 C)  98.6 F (37 C) 98.2 F (36.8 C)  TempSrc: Oral  Oral Oral  SpO2: 100% 97% 99% 96%  Weight:   64.8 kg     Constitutional: NAD, calm, comfortable Vitals:   02/03/21 1605 02/03/21 1758 02/03/21 1833 02/03/21 2140  BP: (!) 151/90 (!) 136/99  (!) 111/53  Pulse: 81 85 80 77  Resp: 18 16 20 18   Temp: 98.4 F (36.9 C)  98.6 F (37 C) 98.2 F (36.8 C)  TempSrc: Oral  Oral Oral  SpO2: 100% 97% 99% 96%  Weight:   64.8 kg    Eyes: PERRL, lids and conjunctivae normal ENMT: Mucous membranes are moist. Neck: normal, supple, no masses, no thyromegaly Respiratory:  clear to auscultation bilaterally, no wheezing, no crackles. Normal respiratory effort. No accessory muscle use.  Cardiovascular: Regular rate and rhythm, no murmurs / rubs / gallops. No extremity edema. 2+ pedal pulses.  Abdomen: no tenderness, no masses palpated. No hepatosplenomegaly. Bowel sounds positive.  Musculoskeletal: no clubbing / cyanosis. No joint deformity upper and lower extremities. Good ROM, no contractures. Normal muscle tone.  Skin: no rashes, lesions, ulcers. No induration Neurologic: Apparent cranial abnormality, moving extremities spontaneously. Psychiatric: Normal judgment and insight. Alert and oriented x 3. Normal mood.   Labs on Admission: I have personally reviewed  following labs and imaging studies  CBC: Recent Labs  Lab 02/02/21 1024 02/03/21 1359  WBC  --  10.1  NEUTROABS  --  5.8  HGB 7.0* 6.9*  HCT 23.7* 23.8*  MCV  --  94.1  PLT  --  678   Basic Metabolic Panel: Recent Labs  Lab 02/03/21 1359  NA 139  K 4.6  CL 105  CO2 27  GLUCOSE 95  BUN 13  CREATININE 0.45  CALCIUM 9.1   Radiological Exams on Admission: No results found.  EKG: Pending  Assessment/Plan Principal Problem:   Acute on chronic anemia Active Problems:   COPD (chronic obstructive pulmonary disease) ([facility])   Depression   Acute on chronic anemia-hemoglobin 6.9.  Persistent dark stools.  Iron studies confirm iron deficiency anemia.  [facility] transfusions [facility].  Occasional NSAID use.  Follows with Dr. Laural Golden.  Has had EGD and colonoscopy this past March, no bleeding source was identified.  12/16/2020-work-up included small bowel giving capsule study-showed normal small bowel but with rapid transit time of 21 minutes. -Transfuse 1 unit PRBC -IV Protonix 40 every 12 hourly -CBC posttransfusion in a.m. -GI consult in a.m. -N.p.o. midnight  COPD-stable. -Resume home medications  Depression -Resume Zoloft  DVT prophylaxis: SCDs. Code Status: Full code. Family  Communication: None at bedside. Disposition Plan:  ~ 1- 2 days Consults called: None Admission status: Obs,tele    Bethena Roys MD [facility]  02/03/2021, 10:32 PM

## 2021-02-03 NOTE — ED Triage Notes (Signed)
mse needed

## 2021-02-03 NOTE — ED Notes (Signed)
ED TO INPATIENT HANDOFF REPORT  ED Nurse Name and Phone #:  [phone]  S Name/Age/Gender Alexa Nichols 75 y.o. female Room/Bed: APA09/APA09  Code Status   Code Status: Not on file  Home/SNF/Other Home Patient oriented to: self, place, time and situation Is this baseline? Yes   Triage Complete: Triage complete  Chief Complaint Acute on chronic anemia [D64.9]  Triage Note referred here for possible blood transfusion  mse needed    Allergies Allergies  Allergen Reactions  . Aspirin     samter's syndrome  . Codeine Itching  . Sulfa Antibiotics     Unknown reaction  . Other Rash    gel filled fentanyl patch, adhesive on that patch caused ithcing    Level of Care/Admitting Diagnosis ED Disposition    ED Disposition Condition [place] [[id number]]  Level of Care: Telemetry [5]  Covid Evaluation: Asymptomatic Screening Protocol (No Symptoms)  Diagnosis: Acute on chronic anemia [5621308]  Admitting Physician: Bethena Roys [[id number]]  Attending Physician: Bethena Roys Nessa.Cuff       B Medical/Surgery History Past Medical History:  Diagnosis Date  . Asthma   . Chronic low back pain   . COPD (chronic obstructive pulmonary disease) ([place])   . Depression   . GERD (gastroesophageal reflux disease)   . Heart murmur   . Lobular carcinoma of left breast ([place]) 1997   in situ  . Other and unspecified hyperlipidemia   . Seasonal allergies    Past Surgical History:  Procedure Laterality Date  . BIOPSY  12/03/2020   Procedure: BIOPSY;  Surgeon: Rogene Houston, MD;  [facility];  Service: Endoscopy;;  gastric polyps biopsies;  . BREAST SURGERY Left    lumpectomy   . CESAREAN SECTION  1986  . CHOLECYSTECTOMY    . COLONOSCOPY WITH PROPOFOL N/A 12/03/2020   Procedure: COLONOSCOPY WITH PROPOFOL;  Surgeon: Rogene Houston, MD;  [facility];  Service: Endoscopy;  Laterality: N/A;  am  .  ESOPHAGOGASTRODUODENOSCOPY (EGD) WITH PROPOFOL N/A 12/03/2020   Procedure: ESOPHAGOGASTRODUODENOSCOPY (EGD) WITH PROPOFOL;  Surgeon: Rogene Houston, MD;  [facility];  Service: Endoscopy;  Laterality: N/A;  . GIVENS CAPSULE STUDY N/A 12/16/2020   Procedure: GIVENS CAPSULE STUDY;  Surgeon: Rogene Houston, MD;  [facility];  Service: Endoscopy;  Laterality: N/A;  AM  . TONSILLECTOMY     age 56     A IV Location/Drains/Wounds Patient Lines/Drains/Airways Status    Active Line/Drains/Airways    Name Placement date Placement time Site Days   Peripheral IV 02/03/21 Left Hand 02/03/21  1758  Hand  less than 1          Intake/Output Last 24 hours No intake or output data in the 24 hours ending 02/03/21 1800  Labs/Imaging Results for orders placed or performed during the hospital encounter of 02/03/21 (from the past 48 hour(s))  CBC with Differential     Status: Abnormal   Collection Time: 02/03/21  1:59 PM  Result Value Ref Range   WBC 10.1 4.0 - 10.5 K/uL   RBC 2.53 (L) 3.87 - 5.11 MIL/uL   Hemoglobin 6.9 (LL) 12.0 - 15.0 g/dL    Comment: This critical result has verified and been called to [facility] by Charlie Pitter on 05 03 2022 at 1432, and has been read back.    HCT 23.8 (L) 36.0 - 46.0 %   MCV 94.1  80.0 - 100.0 fL   MCH 27.3 26.0 - 34.0 pg   MCHC 29.0 (L) 30.0 - 36.0 g/dL   RDW 16.3 (H) 11.5 - 15.5 %   Platelets 372 150 - 400 K/uL   nRBC 0.3 (H) 0.0 - 0.2 %   Neutrophils Relative % 57 %   Neutro Abs 5.8 1.7 - 7.7 K/uL   Lymphocytes Relative 27 %   Lymphs Abs 2.8 0.7 - 4.0 K/uL   Monocytes Relative 10 %   Monocytes Absolute 1.0 0.1 - 1.0 K/uL   Eosinophils Relative 4 %   Eosinophils Absolute 0.4 0.0 - 0.5 K/uL   Basophils Relative 1 %   Basophils Absolute 0.1 0.0 - 0.1 K/uL   Immature Granulocytes 1 %   Abs Immature Granulocytes 0.07 0.00 - 0.07 K/uL    Comment: Performed at [facility], [address] [place], [place] [address]  Basic  metabolic panel     Status: None   Collection Time: 02/03/21  1:59 PM  Result Value Ref Range   Sodium 139 135 - 145 mmol/L   Potassium 4.6 3.5 - 5.1 mmol/L   Chloride 105 98 - 111 mmol/L   CO2 27 22 - 32 mmol/L   Glucose, Bld 95 70 - 99 mg/dL    Comment: Glucose reference range applies only to samples taken after fasting for at least 8 hours.   BUN 13 8 - 23 mg/dL   Creatinine, Ser 0.45 0.44 - 1.00 mg/dL   Calcium 9.1 8.9 - 10.3 mg/dL   GFR, Estimated >60 >60 mL/min    Comment: (NOTE) Calculated using the CKD-EPI Creatinine Equation (2021)    Anion gap 7 5 - 15    Comment: Performed at [facility], [address], [place], [place] [address]  Type and screen [facility]     Status: None   Collection Time: 02/03/21  1:59 PM  Result Value Ref Range   ABO/RH(D) A NEG    Antibody Screen POS    Sample Expiration      02/06/2021,2359 Performed at [facility], [address] [place], [place] [address]   Prepare RBC (crossmatch)     Status: None   Collection Time: 02/03/21  2:38 PM  Result Value Ref Range   Order Confirmation      ORDER PROCESSED BY BLOOD BANK Performed at [facility], [address], [place], [place] [address]   ABO/Rh     Status: None   Collection Time: 02/03/21  3:01 PM  Result Value Ref Range   ABO/RH(D)      A NEG Performed at [facility], [address] [place], [place] [address]    No results found.  Pending Labs [employer] (From admission, onward)          Start     Ordered   02/03/21 1642  SARS CORONAVIRUS 2 (TAT 6-24 HRS) Nasopharyngeal Nasopharyngeal Swab  (Tier 3 - Symptomatic/asymptomatic)  Once,   STAT       Question Answer Comment  Is this test for diagnosis or screening Screening   Symptomatic for COVID-19 as defined by CDC No   Hospitalized for COVID-19 No   Admitted to ICU for COVID-19 No   Previously tested for COVID-19 Yes   Resident in a congregate (group) care setting No   Employed in healthcare setting No    Pregnant No   Has patient completed COVID vaccination(s) (2 doses of Pfizer/Moderna 1 dose of [employer]) Unknown  02/03/21 1641          Vitals/Pain Today's Vitals   02/03/21 1253 02/03/21 1256 02/03/21 1605 02/03/21 1758  BP: (!) 145/78  (!) 151/90 (!) 136/99  Pulse: 85  81 85  Resp: 18  18 16   Temp: 98.8 F (37.1 C)  98.4 F (36.9 C)   TempSrc: Oral  Oral   SpO2: 97%  100% 97%  PainSc:  0-No pain      Isolation Precautions No active isolations  Medications Medications  0.9 %  sodium chloride infusion (has no administration in time range)    Mobility walks Low fall risk   Focused Assessments    R Recommendations: See Admitting Provider Note  Report given to:   Additional Notes:

## 2021-02-04 ENCOUNTER — Encounter (HOSPITAL_COMMUNITY)
Admission: EM | Disposition: A | Discharge status: Home / Self Care | Payer: Self-pay | Source: Home / Self Care | Attending: Internal Medicine

## 2021-02-04 ENCOUNTER — Observation Stay (HOSPITAL_COMMUNITY): Payer: Medicare Other

## 2021-02-04 ENCOUNTER — Encounter (HOSPITAL_COMMUNITY): Payer: Self-pay | Admitting: Internal Medicine

## 2021-02-04 DIAGNOSIS — Z9049 Acquired absence of other specified parts of digestive tract: Secondary | ICD-10-CM | POA: Diagnosis not present

## 2021-02-04 DIAGNOSIS — Z79899 Other long term (current) drug therapy: Secondary | ICD-10-CM | POA: Diagnosis not present

## 2021-02-04 DIAGNOSIS — D62 Acute posthemorrhagic anemia: Secondary | ICD-10-CM | POA: Diagnosis present

## 2021-02-04 DIAGNOSIS — F32A Depression, unspecified: Secondary | ICD-10-CM | POA: Diagnosis present

## 2021-02-04 DIAGNOSIS — Z886 Allergy status to analgesic agent status: Secondary | ICD-10-CM | POA: Diagnosis not present

## 2021-02-04 DIAGNOSIS — Z885 Allergy status to narcotic agent status: Secondary | ICD-10-CM | POA: Diagnosis not present

## 2021-02-04 DIAGNOSIS — K219 Gastro-esophageal reflux disease without esophagitis: Secondary | ICD-10-CM | POA: Diagnosis present

## 2021-02-04 DIAGNOSIS — E785 Hyperlipidemia, unspecified: Secondary | ICD-10-CM | POA: Diagnosis present

## 2021-02-04 DIAGNOSIS — K317 Polyp of stomach and duodenum: Secondary | ICD-10-CM | POA: Diagnosis present

## 2021-02-04 DIAGNOSIS — Z853 Personal history of malignant neoplasm of breast: Secondary | ICD-10-CM | POA: Diagnosis not present

## 2021-02-04 DIAGNOSIS — D649 Anemia, unspecified: Secondary | ICD-10-CM

## 2021-02-04 DIAGNOSIS — D509 Iron deficiency anemia, unspecified: Secondary | ICD-10-CM | POA: Diagnosis not present

## 2021-02-04 DIAGNOSIS — K5521 Angiodysplasia of colon with hemorrhage: Secondary | ICD-10-CM | POA: Diagnosis present

## 2021-02-04 DIAGNOSIS — Z20822 Contact with and (suspected) exposure to covid-19: Secondary | ICD-10-CM | POA: Diagnosis present

## 2021-02-04 DIAGNOSIS — K31811 Angiodysplasia of stomach and duodenum with bleeding: Secondary | ICD-10-CM | POA: Diagnosis present

## 2021-02-04 DIAGNOSIS — J449 Chronic obstructive pulmonary disease, unspecified: Secondary | ICD-10-CM | POA: Diagnosis present

## 2021-02-04 DIAGNOSIS — K449 Diaphragmatic hernia without obstruction or gangrene: Secondary | ICD-10-CM | POA: Diagnosis present

## 2021-02-04 DIAGNOSIS — K254 Chronic or unspecified gastric ulcer with hemorrhage: Secondary | ICD-10-CM | POA: Diagnosis present

## 2021-02-04 DIAGNOSIS — K922 Gastrointestinal hemorrhage, unspecified: Secondary | ICD-10-CM | POA: Diagnosis not present

## 2021-02-04 DIAGNOSIS — Z803 Family history of malignant neoplasm of breast: Secondary | ICD-10-CM | POA: Diagnosis not present

## 2021-02-04 DIAGNOSIS — K222 Esophageal obstruction: Secondary | ICD-10-CM | POA: Diagnosis not present

## 2021-02-04 DIAGNOSIS — K921 Melena: Secondary | ICD-10-CM | POA: Diagnosis not present

## 2021-02-04 DIAGNOSIS — Z87891 Personal history of nicotine dependence: Secondary | ICD-10-CM | POA: Diagnosis not present

## 2021-02-04 DIAGNOSIS — K31819 Angiodysplasia of stomach and duodenum without bleeding: Secondary | ICD-10-CM | POA: Diagnosis not present

## 2021-02-04 DIAGNOSIS — Z7951 Long term (current) use of inhaled steroids: Secondary | ICD-10-CM | POA: Diagnosis not present

## 2021-02-04 DIAGNOSIS — Z91048 Other nonmedicinal substance allergy status: Secondary | ICD-10-CM | POA: Diagnosis not present

## 2021-02-04 DIAGNOSIS — K314 Gastric diverticulum: Secondary | ICD-10-CM | POA: Diagnosis present

## 2021-02-04 DIAGNOSIS — I340 Nonrheumatic mitral (valve) insufficiency: Secondary | ICD-10-CM | POA: Diagnosis present

## 2021-02-04 DIAGNOSIS — Z882 Allergy status to sulfonamides status: Secondary | ICD-10-CM | POA: Diagnosis not present

## 2021-02-04 HISTORY — PX: GIVENS CAPSULE STUDY: SHX5432

## 2021-02-04 LAB — CBC
HCT: 27.1 % — ABNORMAL LOW (ref 36.0–46.0)
Hemoglobin: 8 g/dL — ABNORMAL LOW (ref 12.0–15.0)
MCH: 27 pg (ref 26.0–34.0)
MCHC: 29.5 g/dL — ABNORMAL LOW (ref 30.0–36.0)
MCV: 91.6 fL (ref 80.0–100.0)
Platelets: 311 10*3/uL (ref 150–400)
RBC: 2.96 MIL/uL — ABNORMAL LOW (ref 3.87–5.11)
RDW: 16.5 % — ABNORMAL HIGH (ref 11.5–15.5)
WBC: 9.3 10*3/uL (ref 4.0–10.5)
nRBC: 0.2 % (ref 0.0–0.2)

## 2021-02-04 LAB — SARS CORONAVIRUS 2 (TAT 6-24 HRS): SARS Coronavirus 2: NEGATIVE

## 2021-02-04 IMAGING — CT CT CTA ABD/PEL W/CM AND/OR W/O CM
3 of 12 series · 12 of 46 positions shown, 18 images · IV contrast (omnipaque)
Comparison: [DATE]

CLINICAL DATA: Obscure GI bleed, transfusion dependent anemia.

EXAM:
CTA ABDOMEN AND PELVIS WITHOUT AND WITH CONTRAST
TECHNIQUE: Multidetector CT imaging of the abdomen and pelvis was performed
using the standard protocol during bolus administration of
intravenous contrast. Multiplanar reconstructed images and MIPs were
obtained and reviewed to evaluate the vascular anatomy.
CONTRAST:  100mL OMNIPAQUE IOHEXOL 350 MG/ML SOLN

[Series 9: mesenteric axial arterial · axial · arterial · 0.74mm/px · z∈[-527,-249]mm · 7 of 195 slices shown]
[im 14/195  soft-tissue]
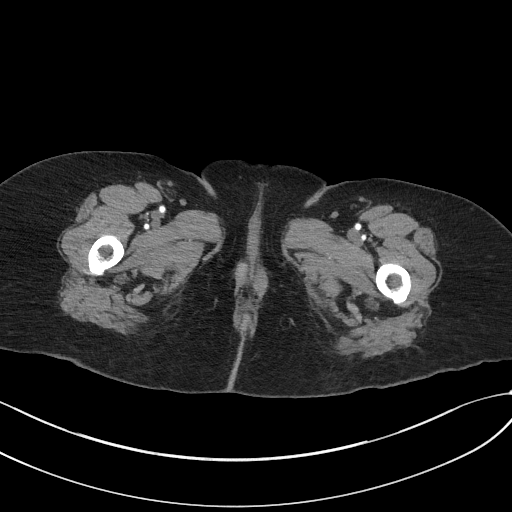
[im 42/195  soft-tissue]
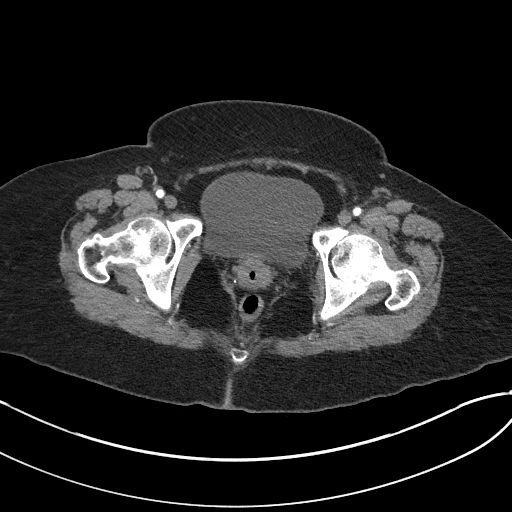
[im 70/195  soft-tissue]
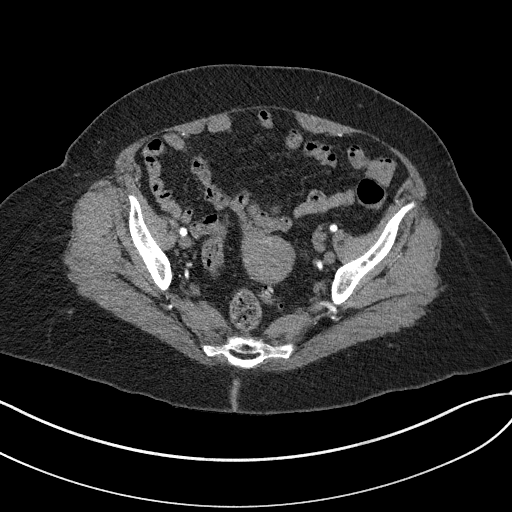
[im 84/195  soft-tissue]
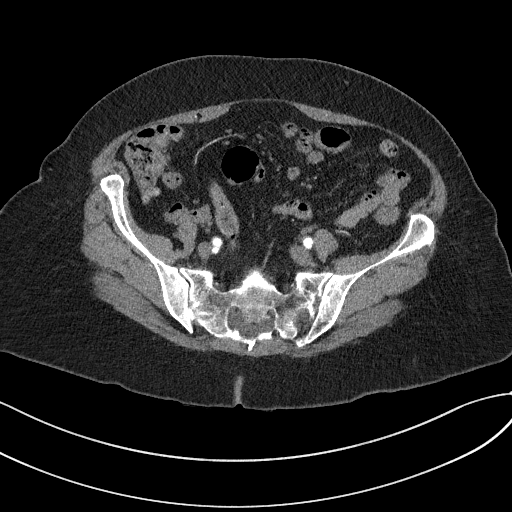
[im 111/195  soft-tissue]
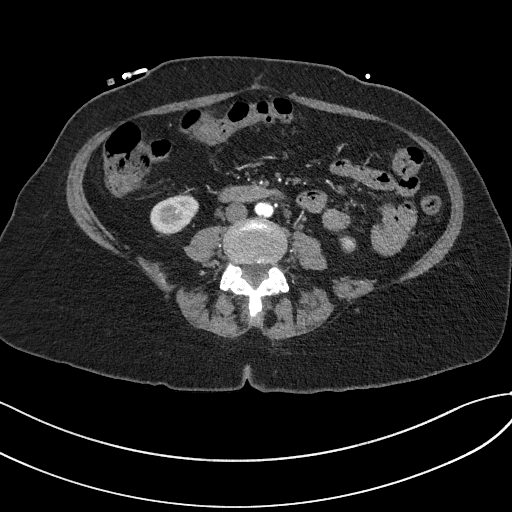
[im 125/195  soft-tissue]
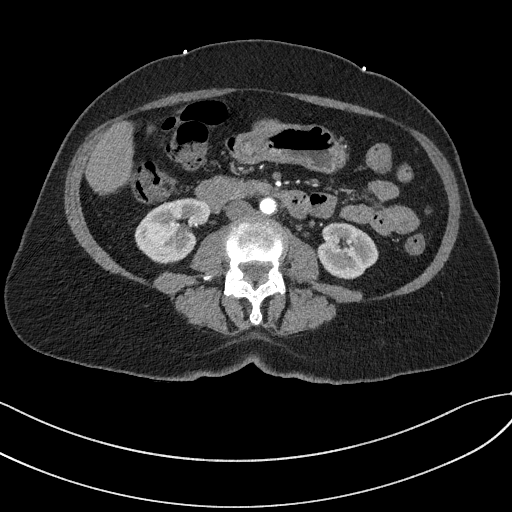
[im 153/195  soft-tissue]
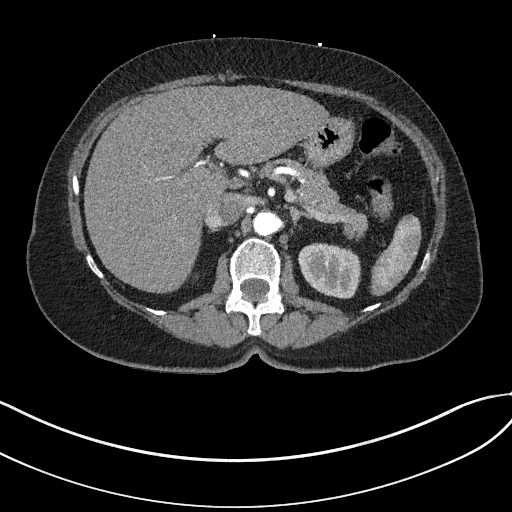

[Series 11: coronals · coronal · 0.74mm/px · 1 of 148 slices shown, 2 images]
[im 74/148  soft-tissue]
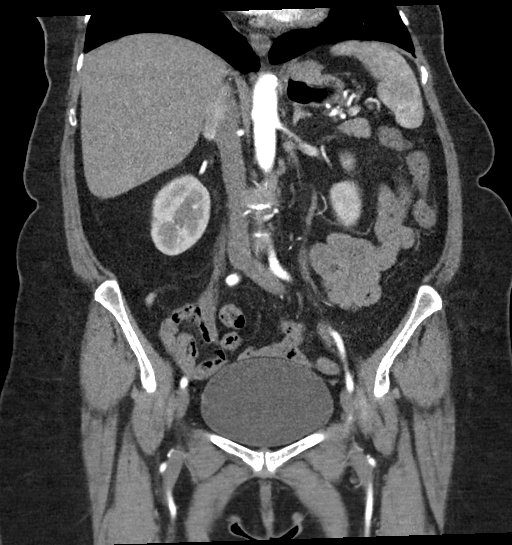
[im 74/148  bone]
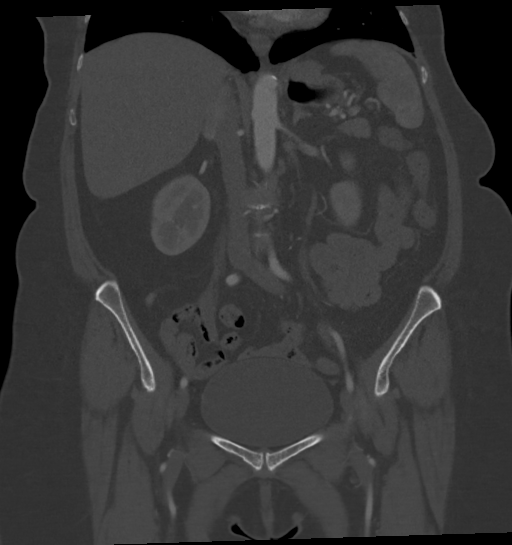

[Series 15: axial venous · axial · portal-venous · 0.74mm/px · z∈[-480,-245]mm · 4 of 79 slices shown, 9 images]
[im 16/79  soft-tissue]
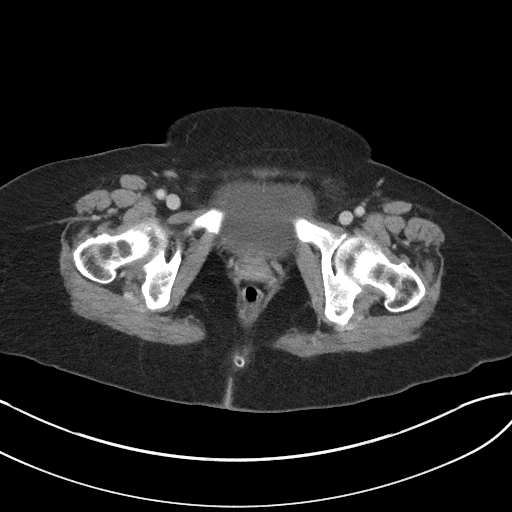
[im 16/79  lung]
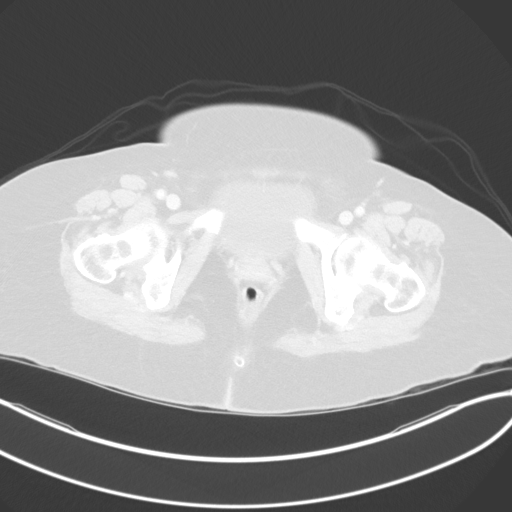
[im 16/79  bone]
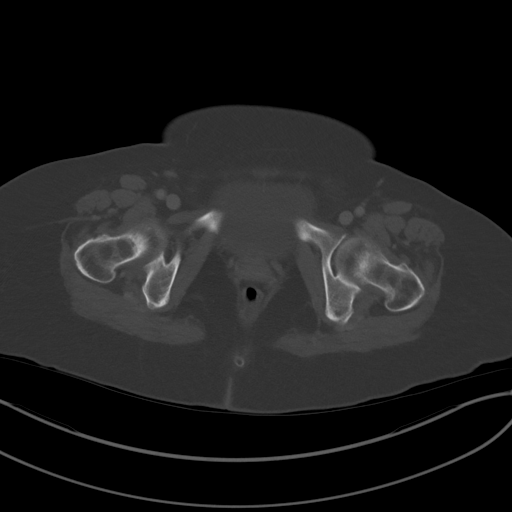
[im 32/79  soft-tissue]
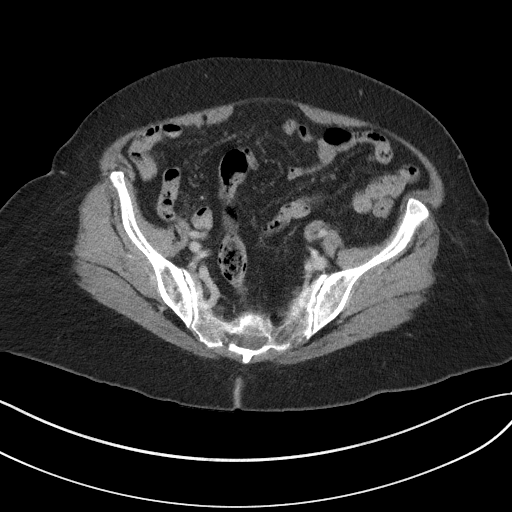
[im 32/79  lung]
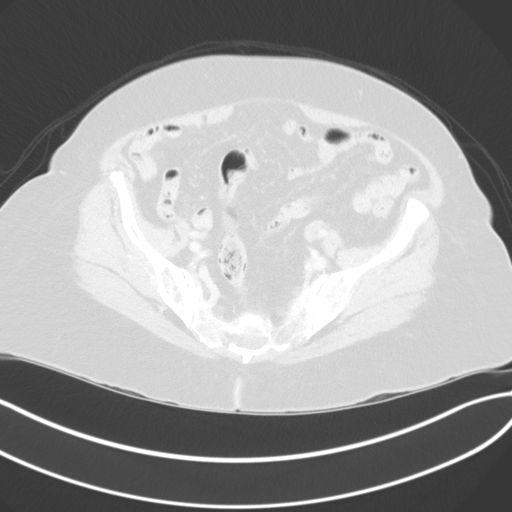
[im 47/79  soft-tissue]
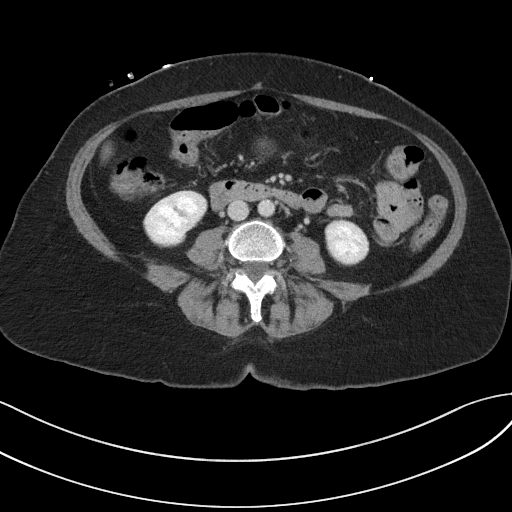
[im 47/79  lung]
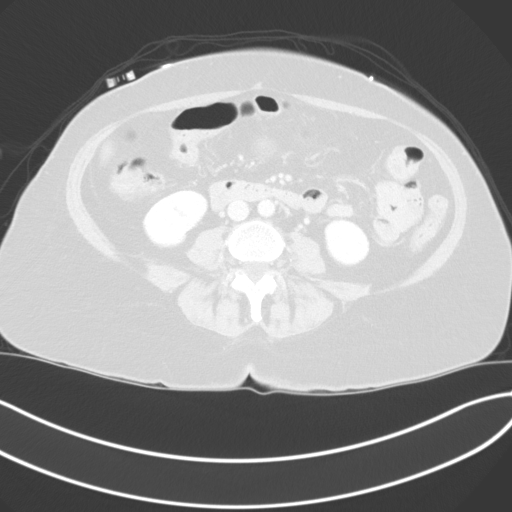
[im 63/79  soft-tissue]
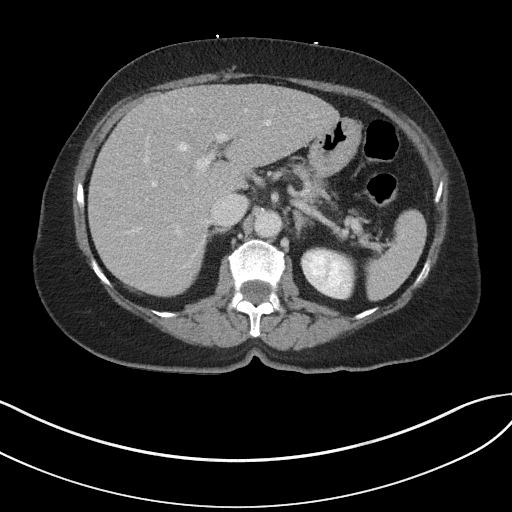
[im 63/79  lung]
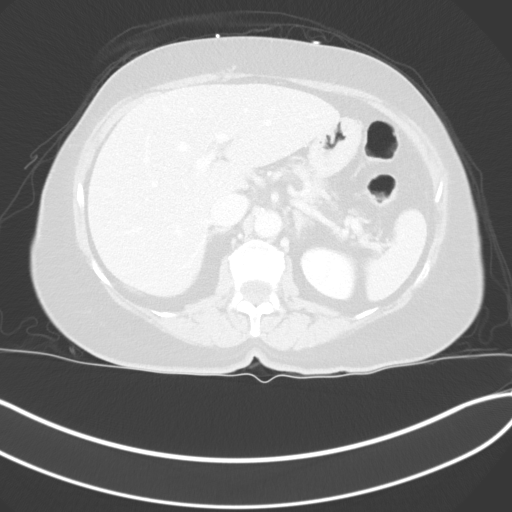

[12 of 46 positions shown; findings below may reference images not displayed]

FINDINGS: VASCULAR

Aorta: Mild calcified atheromatous plaque. No aneurysm, dissection,
or stenosis.

Celiac: Patent.  0.7 cm distal splenic artery fusiform aneurysm.

SMA: Patent without evidence of aneurysm, dissection, vasculitis or
significant stenosis.

Renals: Both renal arteries are patent without evidence of aneurysm,
dissection, vasculitis, fibromuscular dysplasia or significant
stenosis.

IMA: Patent without evidence of aneurysm, dissection, vasculitis or
significant stenosis.

Inflow: Patent without evidence of aneurysm, dissection, vasculitis
or significant stenosis.

Proximal Outflow: Bilateral common femoral and visualized portions
of the superficial and profunda femoral arteries are patent without
evidence of aneurysm, dissection, vasculitis or significant
stenosis.

Veins: Patent hepatic veins, portal vein, SMV, splenic vein,
bilateral renal veins, iliac venous system and IVC. No venous
pathology identified.

Review of the MIP images confirms the above findings.

NON-VASCULAR

Lower chest: No pleural or pericardial effusion. Minimal linear
scarring in the lung bases.

Hepatobiliary: No focal liver abnormality is seen. Status post
cholecystectomy. No biliary dilatation.

Pancreas: Unremarkable. No pancreatic ductal dilatation or
surrounding inflammatory changes.

Spleen: Normal in size without focal abnormality.

Adrenals/Urinary Tract: Adrenal glands unremarkable. Normal renal
enhancement. No hydronephrosis. Urinary bladder physiologically
distended.

Stomach/Bowel: Stomach is nondistended. 6.1 cm posterior gastric
diverticulum (previously 4.9) containing fluid level and hyperdense
material. No associated wall thickening nor inflammatory change. The
small bowel is nondilated. Normal appendix. The colon is nondilated,
unremarkable. No evidence of active extravasation.

Lymphatic: No abdominal or pelvic adenopathy.

Reproductive: Uterus and bilateral adnexa are unremarkable.

Other: No ascites.  No free air.

Musculoskeletal: Lower lumbar spondylitic changes. No fracture or
worrisome bone lesion.
IMPRESSION: 1. No evidence of active extravasation into the bowel.
2. Slight interval enlargement of posterior gastric diverticulum
without adjacent inflammatory/edematous change.

## 2021-02-04 SURGERY — IMAGING PROCEDURE, GI TRACT, INTRALUMINAL, VIA CAPSULE

## 2021-02-04 MED ORDER — HYDROCODONE-ACETAMINOPHEN 5-325 MG PO TABS
1.0000 | ORAL_TABLET | Freq: Two times a day (BID) | ORAL | Status: DC | PRN
  Administered 2021-02-04 – 2021-02-06 (×3): 1 via ORAL
  Filled 2021-02-04 (×3): qty 1

## 2021-02-04 MED ORDER — IOHEXOL 350 MG/ML SOLN
100.0000 mL | Freq: Once | INTRAVENOUS | Status: AC | PRN
  Administered 2021-02-04: 100 mL via INTRAVENOUS

## 2021-02-04 MED ORDER — HYOSCYAMINE SULFATE 0.125 MG SL SUBL
0.1250 mg | SUBLINGUAL_TABLET | Freq: Once | SUBLINGUAL | Status: AC
  Administered 2021-02-04: 0.125 mg via ORAL
  Filled 2021-02-04: qty 1

## 2021-02-04 NOTE — Progress Notes (Signed)
Alert and oriented x 4. Denies pain or discomfort. Blood product transfused and tolerated well. Pt independent with adls. SCD pump in use. No s/s of active bleeding noted. Continue plan of care.

## 2021-02-04 NOTE — Progress Notes (Addendum)
Dr. Laural Golden spoke with [facility] on phone stating capsule was in pt's colon and that the study could now be discontinued.

## 2021-02-04 NOTE — Progress Notes (Signed)
PROGRESS NOTE    Alexa Nichols  WPY:[id number] DOB: 10-Mar-1946 DOA: 02/03/2021 PCP: [name], MD   Brief Narrative:  Alexa Nichols is a 75 y.o. female with medical history significant for COPD, asthma, mitral regurgitation. Patient presented to the ED with reports of low hemoglobin.    GI has been consulted and has performed CT angiogram of the abdomen and pelvis with no acute findings.  Plans for capsule endoscopy likely.  Assessment & Plan:   Principal Problem:   Acute on chronic anemia Active Problems:   COPD (chronic obstructive pulmonary disease) (HCC)   Depression   Acute blood loss anemia   Acute on chronic anemia likely related to chronic GI blood loss -Appreciate GI evaluation -CT angiogram of the abdomen with no acute findings -Likely plan for small capsule endoscopy for further evaluation -Recent upper and lower endoscopy performed 12/2020 -Continue clear liquid diet and PPI twice daily  COPD-stable -Continue home medications  Depression -Zoloft   DVT prophylaxis:SCDs Code Status: Full Family Communication: None at bedside Disposition Plan:  Status is: Inpatient  Remains inpatient appropriate because:Ongoing diagnostic testing needed not appropriate for outpatient work up and IV treatments appropriate due to intensity of illness or inability to take PO   Dispo: The patient is from: Home              Anticipated d/c is to: Home              Patient currently is not medically stable to d/c.   Difficult to place patient No  Consultants:   GI  Procedures:  See below  Antimicrobials:   None   Subjective: Patient seen and evaluated today with no new acute complaints or concerns. No acute concerns or events noted overnight.  She continues to have dark bowel movements, but no other issues noted.  Objective: Vitals:   02/03/21 2302 02/04/21 0117 02/04/21 0337 02/04/21 0802  BP: 120/62 115/74 129/64   Pulse: 84 78 75   Resp: 18 18 18     Temp: 98.6 F (37 C) 98.2 F (36.8 C) (!) 97.5 F (36.4 C)   TempSrc: Oral  Oral   SpO2: 98% 98% 95% 97%  Weight:        Intake/Output Summary (Last 24 hours) at 02/04/2021 1442 Last data filed at 02/04/2021 1300 Gross per 24 hour  Intake 815 ml  Output --  Net 815 ml   Filed Weights   02/03/21 1833  Weight: 64.8 kg    Examination:  General exam: Appears calm and comfortable  Respiratory system: Clear to auscultation. Respiratory effort normal. Cardiovascular system: S1 & S2 heard, RRR.  Gastrointestinal system: Abdomen is soft Central nervous system: Alert and awake Extremities: No edema Skin: No significant lesions noted Psychiatry: Flat affect.    Data Reviewed: I have personally reviewed following labs and imaging studies  CBC: Recent Labs  Lab 02/02/21 1024 02/03/21 1359 02/04/21 0450  WBC  --  10.1 9.3  NEUTROABS  --  5.8  --   HGB 7.0* 6.9* 8.0*  HCT 23.7* 23.8* 27.1*  MCV  --  94.1 91.6  PLT  --  372 053   Basic Metabolic Panel: Recent Labs  Lab 02/03/21 1359  NA 139  K 4.6  CL 105  CO2 27  GLUCOSE 95  BUN 13  CREATININE 0.45  CALCIUM 9.1   GFR: Estimated Creatinine Clearance: 50.7 mL/min (by C-G formula based on SCr of 0.45 mg/dL). Liver Function Tests: No results for  input(s): AST, ALT, ALKPHOS, BILITOT, PROT, ALBUMIN in the last 168 hours. No results for input(s): LIPASE, AMYLASE in the last 168 hours. No results for input(s): AMMONIA in the last 168 hours. Coagulation Profile: No results for input(s): INR, PROTIME in the last 168 hours. Cardiac Enzymes: No results for input(s): CKTOTAL, CKMB, CKMBINDEX, TROPONINI in the last 168 hours. BNP (last 3 results) No results for input(s): PROBNP in the last 8760 hours. HbA1C: No results for input(s): HGBA1C in the last 72 hours. CBG: No results for input(s): GLUCAP in the last 168 hours. Lipid Profile: No results for input(s): CHOL, HDL, LDLCALC, TRIG, CHOLHDL, LDLDIRECT in the last 72  hours. Thyroid Function Tests: No results for input(s): TSH, T4TOTAL, FREET4, T3FREE, THYROIDAB in the last 72 hours. Anemia Panel: No results for input(s): VITAMINB12, FOLATE, FERRITIN, TIBC, IRON, RETICCTPCT in the last 72 hours. Sepsis Labs: No results for input(s): PROCALCITON, LATICACIDVEN in the last 168 hours.  Recent Results (from the past 240 hour(s))  SARS CORONAVIRUS 2 (TAT 6-24 HRS) Nasopharyngeal Nasopharyngeal Swab     Status: None   Collection Time: 02/03/21  4:42 PM   Specimen: Nasopharyngeal Swab  Result Value Ref Range Status   SARS Coronavirus 2 NEGATIVE NEGATIVE Final    Comment: (NOTE) SARS-CoV-2 target nucleic acids are NOT DETECTED.  The SARS-CoV-2 RNA is generally detectable in upper and lower respiratory specimens during the acute phase of infection. Negative results do not preclude SARS-CoV-2 infection, do not rule out co-infections with other pathogens, and should not be used as the sole basis for treatment or other patient management decisions. Negative results must be combined with clinical observations, patient history, and epidemiological information. The expected result is Negative.  Fact Sheet for Patients: SugarRoll.be  Fact Sheet for Healthcare Providers: https://www.woods-mathews.com/  This test is not yet approved or cleared by the [place] FDA and  has been authorized for detection and/or diagnosis of SARS-CoV-2 by FDA under an Emergency Use Authorization (EUA). This EUA will remain  in effect (meaning this test can be used) for the duration of the COVID-19 declaration under Se ction 564(b)(1) of the Act, 21 U.S.C. section 360bbb-3(b)(1), unless the authorization is terminated or revoked sooner.  Performed at [facility], [place] [address], [place], [place] [address]          Radiology Studies: CT Angio Abd/Pel w/ and/or w/o  Result Date: 02/04/2021 CLINICAL DATA:  Obscure GI  bleed, transfusion dependent anemia. EXAM: CTA ABDOMEN AND PELVIS WITHOUT AND WITH CONTRAST TECHNIQUE: Multidetector CT imaging of the abdomen and pelvis was performed using the standard protocol during bolus administration of intravenous contrast. Multiplanar reconstructed images and MIPs were obtained and reviewed to evaluate the vascular anatomy. CONTRAST:  160mL OMNIPAQUE IOHEXOL 350 MG/ML SOLN COMPARISON:  12/07/2015 FINDINGS: VASCULAR Aorta: Mild calcified atheromatous plaque. No aneurysm, dissection, or stenosis. Celiac: Patent.  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 SMA: Patent without evidence of aneurysm, dissection, vasculitis or significant stenosis. Renals: Both renal arteries are patent without evidence of aneurysm, dissection, vasculitis, fibromuscular dysplasia or significant stenosis. IMA: Patent without evidence of aneurysm, dissection, vasculitis or significant stenosis. Inflow: Patent without evidence of aneurysm, dissection, vasculitis or significant stenosis. Proximal Outflow: Bilateral common femoral and visualized portions of the superficial and profunda femoral arteries are patent without evidence of aneurysm, dissection, vasculitis or significant stenosis. Veins: Patent hepatic veins, portal vein, SMV, splenic vein, bilateral renal veins, iliac venous system and IVC. No venous pathology identified. Review of the  MIP images confirms the above findings. NON-VASCULAR Lower chest: No pleural or pericardial effusion. Minimal linear scarring in the lung bases. Hepatobiliary: No focal liver abnormality is seen. Status post cholecystectomy. No biliary dilatation. Pancreas: Unremarkable. No pancreatic ductal dilatation or surrounding inflammatory changes. Spleen: Normal in size without focal abnormality. Adrenals/Urinary Tract: Adrenal glands unremarkable. Normal renal enhancement. No hydronephrosis. Urinary bladder physiologically distended. Stomach/Bowel: Stomach is nondistended. 6.1 cm  posterior gastric diverticulum (previously 4.9) containing fluid level and hyperdense material. No associated wall thickening nor inflammatory change. The small bowel is nondilated. Normal appendix. The colon is nondilated, unremarkable. No evidence of active extravasation. Lymphatic: No abdominal or pelvic adenopathy. Reproductive: Uterus and bilateral adnexa are unremarkable. Other: No ascites.  No free air. Musculoskeletal: Lower lumbar spondylitic changes. No fracture or worrisome bone lesion. IMPRESSION: 1. No evidence of active extravasation into the bowel. 2. Slight interval enlargement of posterior gastric diverticulum without adjacent inflammatory/edematous change. Electronically Signed   By: Lucrezia Europe M.D.   On: 02/04/2021 13:40        Scheduled Meds: . fluticasone furoate-vilanterol  1 puff Inhalation Daily  . montelukast  10 mg Oral QHS  . pantoprazole (PROTONIX) IV  40 mg Intravenous Q12H  . sertraline  25 mg Oral Daily  . simvastatin  20 mg Oral Daily  . umeclidinium bromide  1 puff Inhalation Daily    LOS: 0 days    Time spent: 35 minutes    Mattia Osterman Darleen Crocker, DO [facility]  If 7PM-7AM, please contact night-coverage [email or web address] 02/04/2021, 2:42 PM

## 2021-02-04 NOTE — Progress Notes (Signed)
Consent for capsule study signed and placed in chart. Pt verbalized understanding. Levsin administered @ 1630. Pt remains NPO.

## 2021-02-04 NOTE — Consult Note (Signed)
Referring Provider: Dr. Jenetta Downer Primary Care Physician:  [name], MD Primary Gastroenterologist:  Dr. Laural Golden   Date of Admission: 02/03/21 Date of Consultation: 02/04/21  Reason for Consultation:  Acute on chronic anemia, persistent dark stools, negative EGD/colonoscopy/capsule study.   HPI:  Alexa Nichols is a 75 y.o. year old female presenting with a history of obscure GI bleeding, need for transfusions, with first episode of profound anemia in Dec 2021 when Hgb was found to be 5.6 through PCP, receiving 2 units PRBCs. IDA with ferritin in the 5 range several months ago. Her Hgb had improved to 10 in Feb 2022, down to 8.7 in late April and earlier this week was 7.0 as outpatient. She was instructed to return to the ED and received 1 unit PRBCs with improvement in Hgb to 8. Prior evaluation has been extensive and including colonoscopy with adenomas, EGD with gastric polyps, and capsule with normal small bowel but rapid transit time.    Hemoccult negative 4/5. No hemoccult this admission. Hgb 8.7 over a week ago, now 7. She notes feeling weak earlier this week and had outpatient labs done, which showed acute on chronic anemia. She has noted 3-4 month history of black stool intermittently, with last this morning. No Pepto or iron. Takes Advil at night occasionally for arthritis pain. Off Fosamax now. Feels tired and weak but improved from admission yesterday. Occasional abdominal discomfort but not significant.    Past Medical History:  Diagnosis Date  . Asthma   . Chronic low back pain   . COPD (chronic obstructive pulmonary disease) ([place])   . Depression   . GERD (gastroesophageal reflux disease)   . Heart murmur   . Lobular carcinoma of left breast ([place]) 1997   in situ  . Other and unspecified hyperlipidemia   . Seasonal allergies     Past Surgical History:  Procedure Laterality Date  . BIOPSY  12/03/2020   Procedure: BIOPSY;  Surgeon: Rogene Houston, MD;  [facility]  [facility];  Service: Endoscopy;;  gastric polyps biopsies;  . BREAST SURGERY Left    lumpectomy   . CESAREAN SECTION  1986  . CHOLECYSTECTOMY    . COLONOSCOPY WITH PROPOFOL N/A 12/03/2020    two 4-7 mm polyps in cecum, one small polyp in cecum, one 5 mm polyp, sigmoid diverticulosis, path with tubular adenomas  . ESOPHAGOGASTRODUODENOSCOPY (EGD) WITH PROPOFOL N/A 12/03/2020   normal esophagus, multiple gastric polyps s/p biopsy, small paraesophageal hernia, normal duodenum. Fundic gland polyps.  Marland Kitchen GIVENS CAPSULE STUDY N/A 12/16/2020   normal small bowel capsule but rapid transit time of 21 minutes. Recommended to resume alendronate.   . TONSILLECTOMY     age 75    Prior to Admission medications   Medication Sig Start Date End Date Taking? Authorizing Provider  acetaminophen (TYLENOL) 500 MG tablet Take 500 mg by mouth every 6 (six) hours as needed for moderate pain or headache.    [provider]  ALPRAZolam Duanne Moron) 0.5 MG tablet Take 0.25 mg by mouth daily as needed for anxiety.    [provider]  fentaNYL (DURAGESIC - DOSED MCG/HR) 25 MCG/HR patch Place 25 mcg onto the skin See admin instructions. Place 25 mcg onto the skin for 72 hours then remove as needed for pain    [provider]  HYDROcodone-acetaminophen (NORCO/VICODIN) 5-325 MG per tablet Take 1 tablet by mouth 2 (two) times daily as needed for moderate pain.     [provider]  ibuprofen (ADVIL) 200 MG tablet Take 200 mg by mouth every 6 (six) hours as needed for mild pain or headache.    [provider]  montelukast (SINGULAIR) 10 MG tablet Take 10 mg by mouth at bedtime.    [provider]  Multiple Vitamins-Minerals (PRESERVISION AREDS 2) CAPS Take 1 capsule by mouth in the morning and at bedtime.    [provider]  omeprazole (PRILOSEC) 20 MG capsule Take 20 mg by mouth daily.    [provider]  Pediatric Multivitamins-Iron (FLINTSTONES COMPLETE) 18 MG  CHEW Chew 1 tablet by mouth 2 (two) times daily. 01/27/21   Rehman, Mechele Dawley, MD  predniSONE (DELTASONE) 20 MG tablet Take 10-20 mg by mouth daily as needed (back swelling).    [provider]  sertraline (ZOLOFT) 50 MG tablet Take 25 mg by mouth daily. 01/26/16   [provider]  simvastatin (ZOCOR) 20 MG tablet Take 20 mg by mouth daily.    [provider]  tiZANidine (ZANAFLEX) 4 MG tablet Take 4 mg by mouth at bedtime as needed for muscle spasms.    [provider]  TRELEGY ELLIPTA 100-62.5-25 MCG/INH AEPB Inhale 1 puff into the lungs daily. 01/23/20   [provider]    Current Facility-Administered Medications  Medication Dose Route Frequency Provider Last Rate Last Admin  . acetaminophen (TYLENOL) tablet 650 mg  650 mg Oral Q6H PRN Emokpae, Ejiroghene E, MD       Or  . acetaminophen (TYLENOL) suppository 650 mg  650 mg Rectal Q6H PRN Emokpae, Ejiroghene E, MD      . fluticasone furoate-vilanterol (BREO ELLIPTA) 100-25 MCG/INH 1 puff  1 puff Inhalation Daily Emokpae, Ejiroghene E, MD   1 puff at 02/04/21 0801  . HYDROcodone-acetaminophen (NORCO/VICODIN) 5-325 MG per tablet 1 tablet  1 tablet Oral BID PRN Manuella Ghazi, Pratik D, DO      . montelukast (SINGULAIR) tablet 10 mg  10 mg Oral QHS Emokpae, Ejiroghene E, MD   10 mg at 02/03/21 2309  . ondansetron (ZOFRAN) tablet 4 mg  4 mg Oral Q6H PRN Emokpae, Ejiroghene E, MD       Or  . ondansetron (ZOFRAN) injection 4 mg  4 mg Intravenous Q6H PRN Emokpae, Ejiroghene E, MD      . pantoprazole (PROTONIX) injection 40 mg  40 mg Intravenous Q12H Emokpae, Ejiroghene E, MD   40 mg at 02/04/21 1056  . polyethylene glycol (MIRALAX / GLYCOLAX) packet 17 g  17 g Oral Daily PRN Emokpae, Ejiroghene E, MD      . sertraline (ZOLOFT) tablet 25 mg  25 mg Oral Daily Emokpae, Ejiroghene E, MD   25 mg at 02/04/21 1055  . simvastatin (ZOCOR) tablet 20 mg  20 mg Oral Daily Emokpae, Ejiroghene E, MD   20 mg at 02/04/21 1055  .  traZODone (DESYREL) tablet 50 mg  50 mg Oral QHS PRN Emokpae, Ejiroghene E, MD   50 mg at 02/03/21 2318  . umeclidinium bromide (INCRUSE ELLIPTA) 62.5 MCG/INH 1 puff  1 puff Inhalation Daily Emokpae, Ejiroghene E, MD   1 puff at 02/04/21 0801    Allergies as of 02/03/2021 - Review Complete 02/03/2021  Allergen Reaction Noted  . Aspirin  02/14/2020  . Codeine Itching 02/14/2020  . Sulfa antibiotics  02/14/2020  . Other Rash 01/14/2014    Family History  Problem Relation Age of Onset  . Osteoporosis Mother   . Other Father  head trauma  . Breast cancer Sister        X's 2 sisters    Social History   Socioeconomic History  . Marital status: Married    Spouse name: Not on file  . Number of children: Not on file  . Years of education: Not on file  . Highest education level: Not on file  Occupational History  . Not on file  Tobacco Use  . Smoking status: Former Smoker    Packs/day: 0.50    Years: 20.00    Pack years: 10.00    Types: Cigarettes    Start date: 09/16/1964    Quit date: 10/05/1983    Years since quitting: 37.3  . Smokeless tobacco: Never Used  Vaping Use  . Vaping Use: Never used  Substance and Sexual Activity  . Alcohol use: Never    Alcohol/week: 0.0 standard drinks  . Drug use: Never  . Sexual activity: Not on file  Other Topics Concern  . Not on file  Social History Narrative  . Not on file   Social Determinants of Health   Financial Resource Strain: Not on file  Food Insecurity: Not on file  Transportation Nichols: Not on file  Physical Activity: Not on file  Stress: Not on file  Social Connections: Not on file  Intimate Partner Violence: Not on file    Review of Systems: Gen: Denies fever, chills, loss of appetite, change in weight or weight loss CV: Denies chest pain, heart palpitations, syncope, edema  Resp: Denies shortness of breath with rest, cough, wheezing GI: see HPI GU : Denies urinary burning, urinary frequency, urinary  incontinence.  MS: Denies joint pain,swelling, cramping Derm: Denies rash, itching, dry skin Psych: Denies depression, anxiety,confusion, or memory loss Heme: see HPI  Physical Exam: Vital signs in last 24 hours: Temp:  [97.5 F (36.4 C)-98.8 F (37.1 C)] 97.5 F (36.4 C) (05/04 0337) Pulse Rate:  [75-85] 75 (05/04 0337) Resp:  [16-20] 18 (05/04 0337) BP: (111-151)/(53-99) 129/64 (05/04 0337) SpO2:  [95 %-100 %] 97 % (05/04 0802) Weight:  [64.8 kg] 64.8 kg (05/03 1833) Last BM Date: 02/03/21 General:   Alert,  Well-developed, well-nourished, pleasant and cooperative in NAD Head:  Normocephalic and atraumatic. Eyes:  Sclera clear, no icterus.   Conjunctiva pink. Ears:  Normal auditory acuity. Nose:  No deformity, discharge,  or lesions. Mouth:  No deformity or lesions, dentition normal. Lungs:  Clear throughout to auscultation.   No wheezes, crackles, or rhonchi. No acute distress. Heart:  S1 S2 present with systolic murmur Abdomen:  Soft, nontender and nondistended. No masses, hepatosplenomegaly or hernias noted. Normal bowel sounds, without guarding, and without rebound.   Rectal:  Deferred until time of colonoscopy.   Msk:  Symmetrical without gross deformities. Normal posture. Extremities:  Without  edema. Neurologic:  Alert and  oriented x4 Psych:  Alert and cooperative. Normal mood and affect.  Intake/Output from previous day: 05/03 0701 - 05/04 0700 In: 335 [I.V.:25; Blood:310] Out: -  Intake/Output this shift: Total I/O In: 240 [P.O.:240] Out: -   Lab Results: Recent Labs    02/02/21 1024 02/03/21 1359 02/04/21 0450  WBC  --  10.1 9.3  HGB 7.0* 6.9* 8.0*  HCT 23.7* 23.8* 27.1*  PLT  --  372 311   BMET Recent Labs    02/03/21 1359  NA 139  K 4.6  CL 105  CO2 27  GLUCOSE 95  BUN 13  CREATININE 0.45  CALCIUM 9.1  Impression: Very pleasant 75 y.o. year old female presenting with a history of obscure GI bleeding, IDA,  with first episode of  profound anemia in Dec 2021 when Hgb was found to be 5.6 through PCP, receiving 2 units PRBCs at that time. Now presenting with worsening anemia and Hgb 7, down from 8.7 in late April. Reported melena but no hemoccults on file. She has received 1 unit PRBCs with improvement in Hgb to 8. Symptomatically feels better with improved weakness/fatigue following transfusion.   Evaluation thus far includes colonoscopy, EGD and capsule all recently without obvious etiology. She takes Ibuprofen intermittently but is now off alendronate. Last episode of melena this morning.    Will pursue CTA now. May ultimately need repeat capsule.    Plan: CTA now Avoid NSAIDs May need repeat capsule: if so, will give Levsin prior to capsule as previously had rapid transit through small bowel Further recommendations to follow Anticipate Hematology outpatient evaluation as well  Alexa Needs, PhD, ANP-BC [facility]     LOS: 0 days    02/04/2021, 12:09 PM

## 2021-02-05 ENCOUNTER — Encounter (HOSPITAL_COMMUNITY)
Admission: EM | Disposition: A | Discharge status: Home / Self Care | Payer: Self-pay | Source: Home / Self Care | Attending: Internal Medicine

## 2021-02-05 ENCOUNTER — Inpatient Hospital Stay (HOSPITAL_COMMUNITY): Payer: Medicare Other | Admitting: Anesthesiology

## 2021-02-05 DIAGNOSIS — K921 Melena: Secondary | ICD-10-CM

## 2021-02-05 DIAGNOSIS — D62 Acute posthemorrhagic anemia: Secondary | ICD-10-CM

## 2021-02-05 DIAGNOSIS — K222 Esophageal obstruction: Secondary | ICD-10-CM

## 2021-02-05 HISTORY — PX: ESOPHAGOGASTRODUODENOSCOPY (EGD) WITH PROPOFOL: SHX5813

## 2021-02-05 HISTORY — PX: HOT HEMOSTASIS: SHX5433

## 2021-02-05 LAB — CBC
HCT: 27 % — ABNORMAL LOW (ref 36.0–46.0)
Hemoglobin: 7.9 g/dL — ABNORMAL LOW (ref 12.0–15.0)
MCH: 26.8 pg (ref 26.0–34.0)
MCHC: 29.3 g/dL — ABNORMAL LOW (ref 30.0–36.0)
MCV: 91.5 fL (ref 80.0–100.0)
Platelets: 313 10*3/uL (ref 150–400)
RBC: 2.95 MIL/uL — ABNORMAL LOW (ref 3.87–5.11)
RDW: 16.3 % — ABNORMAL HIGH (ref 11.5–15.5)
WBC: 6.8 10*3/uL (ref 4.0–10.5)
nRBC: 0 % (ref 0.0–0.2)

## 2021-02-05 LAB — GLUCOSE, CAPILLARY: Glucose-Capillary: 95 mg/dL (ref 70–99)

## 2021-02-05 SURGERY — ESOPHAGOGASTRODUODENOSCOPY (EGD) WITH PROPOFOL
Anesthesia: General

## 2021-02-05 MED ORDER — LIDOCAINE HCL (CARDIAC) PF 100 MG/5ML IV SOSY
PREFILLED_SYRINGE | INTRAVENOUS | Status: DC | PRN
  Administered 2021-02-05: 50 mg via INTRAVENOUS

## 2021-02-05 MED ORDER — LACTATED RINGERS IV SOLN: INTRAVENOUS | Status: DC

## 2021-02-05 MED ORDER — SODIUM CHLORIDE 0.9 % IV SOLN: INTRAVENOUS | Status: DC

## 2021-02-05 MED ORDER — STERILE WATER FOR IRRIGATION IR SOLN
Status: DC | PRN
  Administered 2021-02-05: 100 mL

## 2021-02-05 MED ORDER — PROPOFOL 500 MG/50ML IV EMUL
INTRAVENOUS | Status: DC | PRN
  Administered 2021-02-05: 150 ug/kg/min via INTRAVENOUS

## 2021-02-05 MED ORDER — PROPOFOL 10 MG/ML IV BOLUS
INTRAVENOUS | Status: DC | PRN
  Administered 2021-02-05: 100 mg via INTRAVENOUS

## 2021-02-05 NOTE — Transfer of Care (Signed)
Immediate Anesthesia Transfer of Care Note  Patient: Alexa Nichols  Procedure(s) Performed: ESOPHAGOGASTRODUODENOSCOPY (EGD) WITH PROPOFOL (N/A ) HOT HEMOSTASIS (ARGON PLASMA COAGULATION/BICAP)  Patient Location: PACU  Anesthesia Type:General  Level of Consciousness: awake, alert  and oriented  Airway & Oxygen Therapy: Patient Spontanous Breathing and Patient connected to nasal cannula oxygen  Post-op Assessment: Report given to RN and Post -op Vital signs reviewed and stable  Post vital signs: Reviewed and stable  Last Vitals:  Vitals Value Taken Time  BP    Temp    Pulse    Resp    SpO2      Last Pain:  Vitals:   02/05/21 1103  TempSrc:   PainSc: 7       Patients Stated Pain Goal: 5 (89/38/10 1751)  Complications: No complications documented.

## 2021-02-05 NOTE — Anesthesia Postprocedure Evaluation (Signed)
Anesthesia Post Note  Patient: Alexa Nichols  Procedure(s) Performed: ESOPHAGOGASTRODUODENOSCOPY (EGD) WITH PROPOFOL (N/A ) HOT HEMOSTASIS (ARGON PLASMA COAGULATION/BICAP)  Patient location during evaluation: PACU Anesthesia Type: General Level of consciousness: awake and alert and oriented Pain management: pain level controlled Vital Signs Assessment: post-procedure vital signs reviewed and stable Respiratory status: spontaneous breathing and respiratory function stable Cardiovascular status: blood pressure returned to baseline and stable Postop Assessment: no apparent nausea or vomiting Anesthetic complications: no   No complications documented.   Last Vitals:  Vitals:   02/05/21 1214 02/05/21 1319  BP: 137/64 (!) 118/59  Pulse: 66 71  Resp: 16 16  Temp: 36.4 C (!) 36.4 C  SpO2: 97% 100%    Last Pain:  Vitals:   02/05/21 1319  TempSrc: Oral  PainSc:                  [name]

## 2021-02-05 NOTE — Progress Notes (Signed)
Brief op note, full note to follow:  Givens small bowel capsule completed. Some coffee ground material noted in the stomach. Several gastric erosions and questionable lesion with blue center noted, cannot rule out cause of bleeding. Few small bowel erosions/?AVMs. Old blood clot noted in the colon. Images reviewed by both Dr. Laural Golden and Dr. Gala Romney. Discussed with patient. Recommend EGD today. Patient agreeable.  I have discussed the risks, alternatives, benefits with regards to but not limited to the risk of reaction to medication, bleeding, infection, perforation and the patient is agreeable to proceed. Written consent to be obtained.  Laureen Ochs. Bernarda Caffey [facility] [phone] 5/5/20229:19 AM

## 2021-02-05 NOTE — Op Note (Signed)
[facility] Patient Name: Alexa Nichols Procedure Date: 02/05/2021 10:51 AM MRN: [id number] Date of Birth: 29-Jun-1946 Attending MD: Norvel Richards , MD CSN: [id number] Age: 75 Admit Type: Inpatient Procedure:                Upper GI endoscopy Indications:              Melena, Abnormal video capsule endoscopy Providers:                Norvel Richards, MD, Rosina Lowenstein, RN, Aram Candela Referring MD:              Medicines:                Propofol per Anesthesia, Complications:            No immediate complications. Estimated Blood Loss:     Estimated blood loss: none. Procedure:                Pre-Anesthesia Assessment:                           - Prior to the procedure, a History and Physical                            was performed, and patient medications and                            allergies were reviewed. The patient's tolerance of                            previous anesthesia was also reviewed. The risks                            and benefits of the procedure and the sedation                            options and risks were discussed with the patient.                            All questions were answered, and informed consent                            was obtained. Prior Anticoagulants: The patient has                            taken no previous anticoagulant or antiplatelet                            agents. ASA Grade Assessment: III - A patient with                            severe systemic disease. After reviewing the risks  and benefits, the patient was deemed in                            satisfactory condition to undergo the procedure.                           After obtaining informed consent, the endoscope was                            passed under direct vision. Throughout the                            procedure, the patient's blood pressure, pulse, and                            oxygen  saturations were monitored continuously. The                            [id number]) scope was introduced through the                            mouth, and advanced to the fourth part of duodenum.                            The upper GI endoscopy was accomplished without                            difficulty. The patient tolerated the procedure                            well. Scope In: [id number] AM Scope Out: 11:22:16 AM Total Procedure Duration: 0 hours 15 minutes 59 seconds  Findings:      A non-obstructing Schatzki ring was found at the gastroesophageal       junction. Esophagus otherwise normal in appearance.      A small hiatal hernia was present. Gastric cavity empty. No blood.       Multiple fundal gland appearing polyps appeared innocent (previously       biopsied). Out pouching of the fundus/cardia suspicious for a       paraesophageal hernia (appears to actually be a gastric diverticulum as       depicted on yesterday's CT). (3) 2 mm nonbleeding AVMs in the antrum. No       ulcer or infiltrating process or other abnormality. Pylorus patent. (4)       2-3 mm nonbleeding AVMs in the second and third portion of the duodenum.       Otherwise, D1-D4 appeared normal.      Utilizing the APC circular probe these ectatic lesions in the stomach       and duodenum were ablated with multiple applications at 20 J each. This       was done without bleeding or apparent complication. Impression:               - Non-obstructing Schatzki ring. Gastric  diverticulum.                           - Small hiatal hernia. Gastric polyps. Gastric and                            duodenal AVMs. Status post APC ablation.                           - No specimens collected. Moderate Sedation:      Moderate (conscious) sedation was personally administered by an       anesthesia professional. The following parameters were monitored: oxygen       saturation, heart rate, blood  pressure, respiratory rate, EKG, adequacy       of pulmonary ventilation, and response to care. Recommendation:           - Return patient to hospital ward for observation.                           - Clear liquid diet. Trend H&H. Hopefully, can                            advance diet in the near future. Procedure Code(s):        --- Professional ---                           [phone], Esophagogastroduodenoscopy, flexible,                            transoral; diagnostic, including collection of                            specimen(s) by brushing or washing, when performed                            (separate procedure) Diagnosis Code(s):        --- Professional ---                           K22.2, Esophageal obstruction                           K44.9, Diaphragmatic hernia without obstruction or                            gangrene                           K92.1, Melena (includes Hematochezia)                           R93.3, Abnormal findings on diagnostic imaging of                            other parts of digestive tract CPT copyright 2019 [employer]. All rights reserved. The codes documented in this report are preliminary and upon coder review may  be revised to  meet current compliance requirements. Cristopher Estimable. Janique Hoefer, MD Norvel Richards, MD 02/05/2021 11:39:59 AM This report has been signed electronically. Number of Addenda: 0

## 2021-02-05 NOTE — Anesthesia Procedure Notes (Signed)
Date/Time: 02/05/2021 11:09 AM Performed by: Orlie Dakin, CRNA Pre-anesthesia Checklist: Patient identified, Emergency Drugs available, Suction available and Patient being monitored Patient Re-evaluated:Patient Re-evaluated prior to induction Oxygen Delivery Method: Nasal cannula Induction Type: IV induction Placement Confirmation: positive ETCO2

## 2021-02-05 NOTE — Progress Notes (Signed)
PROGRESS NOTE    Alexa Nichols  WFU:[id number] DOB: 12-May-1946 DOA: 02/03/2021 PCP: [name], MD   Brief Narrative:  Alexa Nichols a 75 y.o.femalewith medical history significant forCOPD, asthma, mitral regurgitation. Patient presented to the ED with reports of low hemoglobin.   GI has been consulted and has performed CT angiogram of the abdomen and pelvis with no acute findings.  Patient has undergone capsule endoscopy with findings of some coffee-ground emesis.  She has now undergone EGD as well with findings of gastric and duodenal AVMs and is status post APC.   Assessment & Plan:   Principal Problem:   Acute on chronic anemia Active Problems:   COPD (chronic obstructive pulmonary disease) (HCC)   Depression   Acute blood loss anemia   Acute on chronic anemia secondary to GI bleed related to gastric and duodenal AVMs -Appreciate GI evaluation with endoscopy 5/5 -CT angiogram of the abdomen with no acute findings -Continue clear liquid diet and PPI twice daily, plan to advance per GI  COPD-stable -Continue home medications  Depression -Zoloft   DVT prophylaxis:SCDs Code Status: Full Family Communication: None at bedside Disposition Plan:  Status is: Inpatient  Remains inpatient appropriate because:Ongoing diagnostic testing needed not appropriate for outpatient work up and IV treatments appropriate due to intensity of illness or inability to take PO   Dispo: The patient is from: Home  Anticipated d/c is to: Home  Patient currently is not medically stable to d/c.              Difficult to place patient No  Consultants:   GI  Procedures:  See below  Antimicrobials:   None  Subjective: Patient seen and evaluated today with no new acute complaints or concerns. No acute concerns or events noted overnight.  Objective: Vitals:   02/05/21 1045 02/05/21 1127 02/05/21 1130 02/05/21 1145  BP: 124/64 114/66  125/63 114/63  Pulse: 73 72 74 69  Resp: 20 16 18 19   Temp: 98.2 F (36.8 C) 97.8 F (36.6 C)    TempSrc: Oral     SpO2: 99% 100% 100% 99%  Weight:        Intake/Output Summary (Last 24 hours) at 02/05/2021 1149 Last data filed at 02/05/2021 1130 Gross per 24 hour  Intake 840 ml  Output --  Net 840 ml   Filed Weights   02/03/21 1833  Weight: 64.8 kg    Examination:  General exam: Appears calm and comfortable  Respiratory system: Clear to auscultation. Respiratory effort normal. Cardiovascular system: S1 & S2 heard, RRR.  Gastrointestinal system: Abdomen is soft Central nervous system: Alert and awake Extremities: No edema Skin: No significant lesions noted Psychiatry: Flat affect.    Data Reviewed: I have personally reviewed following labs and imaging studies  CBC: Recent Labs  Lab 02/02/21 1024 02/03/21 1359 02/04/21 0450 02/05/21 0553  WBC  --  10.1 9.3 6.8  NEUTROABS  --  5.8  --   --   HGB 7.0* 6.9* 8.0* 7.9*  HCT 23.7* 23.8* 27.1* 27.0*  MCV  --  94.1 91.6 91.5  PLT  --  372 311 202   Basic Metabolic Panel: Recent Labs  Lab 02/03/21 1359  NA 139  K 4.6  CL 105  CO2 27  GLUCOSE 95  BUN 13  CREATININE 0.45  CALCIUM 9.1   GFR: Estimated Creatinine Clearance: 50.7 mL/min (by C-G formula based on SCr of 0.45 mg/dL). Liver Function Tests: No results for input(s): AST, ALT, ALKPHOS,  BILITOT, PROT, ALBUMIN in the last 168 hours. No results for input(s): LIPASE, AMYLASE in the last 168 hours. No results for input(s): AMMONIA in the last 168 hours. Coagulation Profile: No results for input(s): INR, PROTIME in the last 168 hours. Cardiac Enzymes: No results for input(s): CKTOTAL, CKMB, CKMBINDEX, TROPONINI in the last 168 hours. BNP (last 3 results) No results for input(s): PROBNP in the last 8760 hours. HbA1C: No results for input(s): HGBA1C in the last 72 hours. CBG: Recent Labs  Lab 02/05/21 0752  GLUCAP 95   Lipid Profile: No results for  input(s): CHOL, HDL, LDLCALC, TRIG, CHOLHDL, LDLDIRECT in the last 72 hours. Thyroid Function Tests: No results for input(s): TSH, T4TOTAL, FREET4, T3FREE, THYROIDAB in the last 72 hours. Anemia Panel: No results for input(s): VITAMINB12, FOLATE, FERRITIN, TIBC, IRON, RETICCTPCT in the last 72 hours. Sepsis Labs: No results for input(s): PROCALCITON, LATICACIDVEN in the last 168 hours.  Recent Results (from the past 240 hour(s))  SARS CORONAVIRUS 2 (TAT 6-24 HRS) Nasopharyngeal Nasopharyngeal Swab     Status: None   Collection Time: 02/03/21  4:42 PM   Specimen: Nasopharyngeal Swab  Result Value Ref Range Status   SARS Coronavirus 2 NEGATIVE NEGATIVE Final    Comment: (NOTE) SARS-CoV-2 target nucleic acids are NOT DETECTED.  The SARS-CoV-2 RNA is generally detectable in upper and lower respiratory specimens during the acute phase of infection. Negative results do not preclude SARS-CoV-2 infection, do not rule out co-infections with other pathogens, and should not be used as the sole basis for treatment or other patient management decisions. Negative results must be combined with clinical observations, patient history, and epidemiological information. The expected result is Negative.  Fact Sheet for Patients: SugarRoll.be  Fact Sheet for Healthcare Providers: https://www.woods-mathews.com/  This test is not yet approved or cleared by the [place] FDA and  has been authorized for detection and/or diagnosis of SARS-CoV-2 by FDA under an Emergency Use Authorization (EUA). This EUA will remain  in effect (meaning this test can be used) for the duration of the COVID-19 declaration under Se ction 564(b)(1) of the Act, 21 U.S.C. section 360bbb-3(b)(1), unless the authorization is terminated or revoked sooner.  Performed at [facility], [place] [address] [place], [place] [address]          Radiology Studies: CT Angio Abd/Pel  w/ and/or w/o  Result Date: 02/04/2021 CLINICAL DATA:  Obscure GI bleed, transfusion dependent anemia. EXAM: CTA ABDOMEN AND PELVIS WITHOUT AND WITH CONTRAST TECHNIQUE: Multidetector CT imaging of the abdomen and pelvis was performed using the standard protocol during bolus administration of intravenous contrast. Multiplanar reconstructed images and MIPs were obtained and reviewed to evaluate the vascular anatomy. CONTRAST:  165mL OMNIPAQUE IOHEXOL 350 MG/ML SOLN COMPARISON:  12/07/2015 FINDINGS: VASCULAR Aorta: Mild calcified atheromatous plaque. No aneurysm, dissection, or stenosis. Celiac: Patent.  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 SMA: Patent without evidence of aneurysm, dissection, vasculitis or significant stenosis. Renals: Both renal arteries are patent without evidence of aneurysm, dissection, vasculitis, fibromuscular dysplasia or significant stenosis. IMA: Patent without evidence of aneurysm, dissection, vasculitis or significant stenosis. Inflow: Patent without evidence of aneurysm, dissection, vasculitis or significant stenosis. Proximal Outflow: Bilateral common femoral and visualized portions of the superficial and profunda femoral arteries are patent without evidence of aneurysm, dissection, vasculitis or significant stenosis. Veins: Patent hepatic veins, portal vein, SMV, splenic vein, bilateral renal veins, iliac venous system and IVC. No venous pathology identified. Review of the MIP images confirms  the above findings. NON-VASCULAR Lower chest: No pleural or pericardial effusion. Minimal linear scarring in the lung bases. Hepatobiliary: No focal liver abnormality is seen. Status post cholecystectomy. No biliary dilatation. Pancreas: Unremarkable. No pancreatic ductal dilatation or surrounding inflammatory changes. Spleen: Normal in size without focal abnormality. Adrenals/Urinary Tract: Adrenal glands unremarkable. Normal renal enhancement. No hydronephrosis. Urinary bladder  physiologically distended. Stomach/Bowel: Stomach is nondistended. 6.1 cm posterior gastric diverticulum (previously 4.9) containing fluid level and hyperdense material. No associated wall thickening nor inflammatory change. The small bowel is nondilated. Normal appendix. The colon is nondilated, unremarkable. No evidence of active extravasation. Lymphatic: No abdominal or pelvic adenopathy. Reproductive: Uterus and bilateral adnexa are unremarkable. Other: No ascites.  No free air. Musculoskeletal: Lower lumbar spondylitic changes. No fracture or worrisome bone lesion. IMPRESSION: 1. No evidence of active extravasation into the bowel. 2. Slight interval enlargement of posterior gastric diverticulum without adjacent inflammatory/edematous change. Electronically Signed   By: Lucrezia Europe M.D.   On: 02/04/2021 13:40        Scheduled Meds: . [MAR Hold] fluticasone furoate-vilanterol  1 puff Inhalation Daily  . [MAR Hold] montelukast  10 mg Oral QHS  . [MAR Hold] pantoprazole (PROTONIX) IV  40 mg Intravenous Q12H  . [MAR Hold] sertraline  25 mg Oral Daily  . [MAR Hold] simvastatin  20 mg Oral Daily  . [MAR Hold] umeclidinium bromide  1 puff Inhalation Daily   Continuous Infusions: . sodium chloride    . lactated ringers 50 mL/hr at 02/05/21 1102     LOS: 1 day    Time spent: 35 minutes    Breeonna Mone Darleen Crocker, DO [facility]  If 7PM-7AM, please contact night-coverage [email or web address] 02/05/2021, 11:49 AM

## 2021-02-05 NOTE — Op Note (Signed)
  Small Bowel Givens Capsule Study Procedure date:  02/05/21  Referring Provider:  Hildred Laser, MD PCP:  Dr. [name], MD  Indication for procedure:  Obscure GI bleeding with need for blood transfusions, IDA. EGD/Colonoscopy and Small bowel capsule completed 12/2020 showing several colon adenomas, multiple gastric polyps, normal small bowel in the setting of rapid transit time. CTA A/P yesterday negative for active extravasation into the bowel. Slight interval enlargement of posterior gastric diverticulum. Patient reports 3-4 month history of intermittent black stool in setting of occasional Advil, daily PPI. No longer on Fosamax.     Patient data:  Wt: 142 pounds Ht: 58 inches  Findings: Patient swallowed capsule without difficulty.  Study noted to be complete as capsule reached colon.  Some coffee-ground material noted in the stomach at 1 minute 12 seconds.  Lesion with bluish tip noted at 1 minute 21 seconds, somewhat suspicious as possible site for recent bleeding.  Couple of gastric erosions also noted at 9 minutes 27 seconds, 9 minutes 47 seconds, 13 minutes 30 seconds.  Prep was excellent.  Small bowel mucosa seen very well.  Single erosion versus small AVM noted at 15 minutes 1 second, erosion at 57 minutes 40 seconds.  At proximal colon at site of cecum, dark liquid.  There was blood clot noted at 2 hours 24 minutes 46 seconds, black material at 3 hours 41 minutes 58 seconds.  First Gastric image: 1 minute 12 seconds First Duodenal image: 14 minutes 7 seconds First Ileo-Cecal Valve image: 58 minutes 19-second First Cecal image: 59 minutes Gastric Passage time: 12 minutes  Small Bowel Passage time: 44 minutes   Summary & Recommendations: Coffee-ground material noted on gastric images with suspicious lesion as outlined above.  Couple of gastric erosions seen.  Small bowel with a couple of erosions versus small AVM.  Proximal colon with dark liquid, more distally blood clot noted as  outlined.  Pertinent images reviewed by both Dr. Gala Romney, Dr. Laural Golden, and patient.   1. Upper endoscopy planned for today.  Patient agreeable.   Laureen Ochs. Bernarda Caffey [facility] [phone] 5/5/202211:02 AM

## 2021-02-05 NOTE — Anesthesia Preprocedure Evaluation (Signed)
Anesthesia Evaluation  Patient identified by MRN, date of birth, ID band Patient awake    Reviewed: Allergy & Precautions, NPO status , Patient's Chart, lab work & pertinent test results  History of Anesthesia Complications Negative for: history of anesthetic complications  Airway Mallampati: II  TM Distance: >3 FB Neck ROM: Full    Dental  (+) Dental Advisory Given Crowns :   Pulmonary shortness of breath and with exertion, asthma , COPD,  COPD inhaler, former smoker,    Pulmonary exam normal breath sounds clear to auscultation       Cardiovascular Exercise Tolerance: Good + Valvular Problems/Murmurs MR  Rhythm:Regular Rate:Normal + Systolic murmurs [id number] 13:18:16 [place] System-AP-OPS ROUTINE RECORD Normal sinus rhythm Low voltage QRS Cannot rule out Anteroseptal infarct , age undetermined ST & T wave abnormality, consider lateral ischemia Abnormal ECG Confirmed by Asencion Noble [phone]) on 12/02/2020 5:57:21 PM   Neuro/Psych PSYCHIATRIC DISORDERS Depression    GI/Hepatic Neg liver ROS, GERD  Medicated and Controlled,  Endo/Other  negative endocrine ROS  Renal/GU negative Renal ROS     Musculoskeletal  (+) Arthritis  (chronic low back pain),   Abdominal   Peds  Hematology  (+) anemia ,   Anesthesia Other Findings Left breast cancer Degenerative retinal drusen of right eye Early stage nonexudative age-related macular degeneration of right eye Exudative age-related macular degeneration of right eye with active choroidal neovascularization ([place]) History of vitrectomy.    Reproductive/Obstetrics negative OB ROS                            Anesthesia Physical  Anesthesia Plan  ASA: III  Anesthesia Plan: General   Post-op Pain Management:    Induction: Intravenous  PONV Risk Score and Plan: Propofol infusion  Airway Management Planned: Nasal Cannula and Natural  Airway  Additional Equipment:   Intra-op Plan:   Post-operative Plan:   Informed Consent: I have reviewed the patients History and Physical, chart, labs and discussed the procedure including the risks, benefits and alternatives for the proposed anesthesia with the patient or authorized representative who has indicated his/her understanding and acceptance.     Dental advisory given  Plan Discussed with: CRNA and Surgeon  Anesthesia Plan Comments:         Anesthesia Quick Evaluation

## 2021-02-06 LAB — CBC
HCT: 25.7 % — ABNORMAL LOW (ref 36.0–46.0)
Hemoglobin: 7.4 g/dL — ABNORMAL LOW (ref 12.0–15.0)
MCH: 26.6 pg (ref 26.0–34.0)
MCHC: 28.8 g/dL — ABNORMAL LOW (ref 30.0–36.0)
MCV: 92.4 fL (ref 80.0–100.0)
Platelets: 316 10*3/uL (ref 150–400)
RBC: 2.78 MIL/uL — ABNORMAL LOW (ref 3.87–5.11)
RDW: 16.1 % — ABNORMAL HIGH (ref 11.5–15.5)
WBC: 6.7 10*3/uL (ref 4.0–10.5)
nRBC: 0 % (ref 0.0–0.2)

## 2021-02-06 LAB — HEMOGLOBIN AND HEMATOCRIT, BLOOD
HCT: 31.5 % — ABNORMAL LOW (ref 36.0–46.0)
Hemoglobin: 9.5 g/dL — ABNORMAL LOW (ref 12.0–15.0)

## 2021-02-06 LAB — PREPARE RBC (CROSSMATCH)

## 2021-02-06 MED ORDER — SODIUM CHLORIDE 0.9% IV SOLUTION: Freq: Once | INTRAVENOUS | Status: AC

## 2021-02-06 NOTE — Plan of Care (Signed)

## 2021-02-06 NOTE — Progress Notes (Signed)
UNMATCHED BLOOD PRODUCT NOTE  Compare the patient ID on the blood tag to the patient ID on the hospital armband and Blood Bank armband. Then confirm the unit number on the blood tag matches the unit number on the blood product.  If a discrepancy is discovered return the product to blood bank immediately.   Blood Product Type: Packed Red Blood Cells  Unit #: (Found on blood product bag, begins with W) [id number]  Product Code #: (Found on blood product bag, begins with E) [id number]   Start Time: 1705  Starting Rate: 120 ml/hr  Rate increase/decreased  (if applicable):  301 ml/hr  Rate changed time (if applicable): 6010   Stop Time: 9323   All Other Documentation should be documented within the Blood Admin Flowsheet per policy.

## 2021-02-06 NOTE — Care Management Important Message (Signed)
Important Message  Patient Details  Name: Alexa Nichols MRN: [id number] Date of Birth: [birth year]   Medicare Important Message Given:  Yes     Tommy Medal 02/06/2021, 11:30 AM

## 2021-02-06 NOTE — Progress Notes (Signed)
Subjective: Feels better today, still some weakness.  Denies ongoing abdominal pain, nausea, vomiting.  She did have a bowel movement this morning that was loose stools that she attributes to being on a liquid diet.  No hematochezia or melena.  No other overt GI complaints.  We had a good discussion on the pathophysiology of AVMs and management.  Objective: Vital signs in last 24 hours: Temp:  [97.5 F (36.4 C)-98.9 F (37.2 C)] 97.7 F (36.5 C) (05/06 0528) Pulse Rate:  [71-76] 74 (05/06 0528) Resp:  [16-20] 20 (05/06 0528) BP: (118-150)/(59-67) 131/67 (05/06 0528) SpO2:  [99 %-100 %] 99 % (05/06 0848) FiO2 (%):  [21 %] 21 % (05/05 1319) Last BM Date: 02/05/21 General:   Alert and oriented, pleasant Head:  Normocephalic and atraumatic. Eyes:  No icterus, sclera clear. Conjuctiva pink.  Heart:  S1, S2 present, no murmurs noted.  Lungs: Clear to auscultation bilaterally, without wheezing, rales, or rhonchi.  Abdomen:  Bowel sounds present, soft, non-tender, non-distended. No HSM or hernias noted. No rebound or guarding. No masses appreciated  Msk:  Symmetrical without gross deformities. Pulses:  Normal bilateral DP pulses noted. Extremities:  Without clubbing or edema. Neurologic:  Alert and  oriented x4;  grossly normal neurologically. Psych:  Alert and cooperative. Normal mood and affect.  Intake/Output from previous day: 05/05 0701 - 05/06 0700 In: 1118 [P.O.:518; I.V.:600] Out: -  Intake/Output this shift: No intake/output data recorded.  Lab Results: Recent Labs    02/04/21 0450 02/05/21 0553 02/06/21 0556  WBC 9.3 6.8 6.7  HGB 8.0* 7.9* 7.4*  HCT 27.1* 27.0* 25.7*  PLT 311 313 316   BMET Recent Labs    02/03/21 1359  NA 139  K 4.6  CL 105  CO2 27  GLUCOSE 95  BUN 13  CREATININE 0.45  CALCIUM 9.1   LFT No results for input(s): PROT, ALBUMIN, AST, ALT, ALKPHOS, BILITOT, BILIDIR, IBILI in the last 72 hours. PT/INR No results for input(s): LABPROT,  INR in the last 72 hours. Hepatitis Panel No results for input(s): HEPBSAG, HCVAB, HEPAIGM, HEPBIGM in the last 72 hours.   Studies/Results: CT Angio Abd/Pel w/ and/or w/o  Result Date: 02/04/2021 CLINICAL DATA:  Obscure GI bleed, transfusion dependent anemia. EXAM: CTA ABDOMEN AND PELVIS WITHOUT AND WITH CONTRAST TECHNIQUE: Multidetector CT imaging of the abdomen and pelvis was performed using the standard protocol during bolus administration of intravenous contrast. Multiplanar reconstructed images and MIPs were obtained and reviewed to evaluate the vascular anatomy. CONTRAST:  144mL OMNIPAQUE IOHEXOL 350 MG/ML SOLN COMPARISON:  12/07/2015 FINDINGS: VASCULAR Aorta: Mild calcified atheromatous plaque. No aneurysm, dissection, or stenosis. Celiac: Patent.  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 SMA: Patent without evidence of aneurysm, dissection, vasculitis or significant stenosis. Renals: Both renal arteries are patent without evidence of aneurysm, dissection, vasculitis, fibromuscular dysplasia or significant stenosis. IMA: Patent without evidence of aneurysm, dissection, vasculitis or significant stenosis. Inflow: Patent without evidence of aneurysm, dissection, vasculitis or significant stenosis. Proximal Outflow: Bilateral common femoral and visualized portions of the superficial and profunda femoral arteries are patent without evidence of aneurysm, dissection, vasculitis or significant stenosis. Veins: Patent hepatic veins, portal vein, SMV, splenic vein, bilateral renal veins, iliac venous system and IVC. No venous pathology identified. Review of the MIP images confirms the above findings. NON-VASCULAR Lower chest: No pleural or pericardial effusion. Minimal linear scarring in the lung bases. Hepatobiliary: No focal liver abnormality is seen. Status post cholecystectomy. No biliary dilatation. Pancreas:  Unremarkable. No pancreatic ductal dilatation or surrounding inflammatory changes. Spleen:  Normal in size without focal abnormality. Adrenals/Urinary Tract: Adrenal glands unremarkable. Normal renal enhancement. No hydronephrosis. Urinary bladder physiologically distended. Stomach/Bowel: Stomach is nondistended. 6.1 cm posterior gastric diverticulum (previously 4.9) containing fluid level and hyperdense material. No associated wall thickening nor inflammatory change. The small bowel is nondilated. Normal appendix. The colon is nondilated, unremarkable. No evidence of active extravasation. Lymphatic: No abdominal or pelvic adenopathy. Reproductive: Uterus and bilateral adnexa are unremarkable. Other: No ascites.  No free air. Musculoskeletal: Lower lumbar spondylitic changes. No fracture or worrisome bone lesion. IMPRESSION: 1. No evidence of active extravasation into the bowel. 2. Slight interval enlargement of posterior gastric diverticulum without adjacent inflammatory/edematous change. Electronically Signed   By: Lucrezia Europe M.D.   On: 02/04/2021 13:40    Assessment: Very pleasant 75 y.o. year old female presenting with a history of obscure GI bleeding, IDA,  with first episode of profound anemia in Dec 2021 when Hgb was found to be 5.6 through PCP, receiving 2 units PRBCs at that time. Now presenting with worsening anemia and Hgb 7, down from 8.7 in late April. Reported melena but no hemoccults on file. She has received 1 unit PRBCs with improvement in Hgb to 8. Symptomatically felt better with improved weakness/fatigue following transfusion.   CTA completed 02/04/21 which found patent celiac, SMA, IMA.  Nonvascular component with no evidence of active extravasation noted bowel, slight interval enlargement of posterior gastric diverticulum without adjacent inflammatory/edematous change.  Capsule study completed yesterday which found complete study to the colon, covering material on the gastric images with suspicious lesion with appearance of possible recent bleed, small bowel with a couple  erosions versus small AVM, proximal colon with dark liquid and more distally blood clot.  Recommended upper endoscopy.  EGD completed also yesterday found nonobstructing Schatzki's ring, gastric diverticulum, small hiatal hernia, gastric polyps, gastric and duodenal AVMs status post APC ablation.  Recommended clear liquid diet, trend H&H and consider advancing diet as tolerated.  GI Bleed/Anemia: Evaluation thus far includes colonoscopy, EGD and capsule all recently without obvious etiology. She takes Ibuprofen intermittently but is now off alendronate. Last episode of melena two days ago, none today.  Capsule study and EGD yesterday wqith AVMs as likely source of bleeding (as outlined above).  CBC today shows essentially stable hemoglobin at 7.4 (7.9 yesterday). Spoke with Dr. Manuella Ghazi (hospitalist) who plans to give another unit PRBC for trend in hgb.   Plan: 1. Monitor hgb 2. Monitor for acute/active GI bleed 3. Transfuse as necessary 4. Continue PPI 5. Advance diet to soft 6. Advance diet further as tolerated 7. Supportive care   Thank you for allowing [place] to participate in the care of Alexa Muskrat, DNP, AGNP-C Adult & Gerontological Nurse Practitioner [facility]    LOS: 2 days    02/06/2021, 1:03 PM

## 2021-02-06 NOTE — Progress Notes (Signed)
PROGRESS NOTE    Alexa Nichols  SEG:[id number] DOB: 1946-02-06 DOA: 02/03/2021 PCP: [name], MD   Brief Narrative:   Alexa Nichols a 75 y.o.femalewith medical history significant forCOPD, asthma, mitral regurgitation. Patient presented to the ED with reports of low hemoglobin.GI has been consulted and has performed CT angiogram of the abdomen and pelvis with no acute findings.  Patient has undergone capsule endoscopy with findings of some coffee-ground emesis.  She has now undergone EGD as well with findings of gastric and duodenal AVMs and is status post APC. She is fatigued and will require 1 U PRBC transfusion today.  Assessment & Plan:   Principal Problem:   Anemia Active Problems:   COPD (chronic obstructive pulmonary disease) (HCC)   Depression   Acute blood loss anemia   Acute on chronic anemia secondary to GI bleed related to gastric and duodenal AVMs -Appreciate GI evaluation with endoscopy 5/5 -CT angiogram of the abdomen with no acute findings -Continue clear liquid diet and PPI twice daily, plan to advance per GI -Repeat PRBC transfusion of 1 unit today  COPD-stable -Continue home medications  Depression -Zoloft   DVT prophylaxis:SCDs Code Status:Full Family Communication:None at bedside Disposition Plan: Status is: Inpatient  Remains inpatient appropriate because:Ongoing diagnostic testing needed not appropriate for outpatient work up and IV treatments appropriate due to intensity of illness or inability to take PO   Dispo: The patient is from:Home Anticipated d/c is VP:XTGG Patient currently is not medically stable to d/c. Difficult to place patient No  Consultants:  GI  Procedures: See below  Antimicrobials:  None  Subjective: Patient seen and evaluated today with no new acute complaints or concerns other than some fatigue. No acute concerns or events noted  overnight. She would like to have her diet advanced if possible.  Objective: Vitals:   02/05/21 1319 02/05/21 2049 02/06/21 0528 02/06/21 0848  BP: (!) 118/59 (!) 150/60 131/67   Pulse: 71 76 74   Resp: 16 18 20    Temp: (!) 97.5 F (36.4 C) 98.9 F (37.2 C) 97.7 F (36.5 C)   TempSrc: Oral     SpO2: 100% 99% 99% 99%  Weight:        Intake/Output Summary (Last 24 hours) at 02/06/2021 1410 Last data filed at 02/06/2021 1317 Gross per 24 hour  Intake 800 ml  Output --  Net 800 ml   Filed Weights   02/03/21 1833  Weight: 64.8 kg    Examination:  General exam: Appears calm and comfortable  Respiratory system: Clear to auscultation. Respiratory effort normal. Cardiovascular system: S1 & S2 heard, RRR.  Gastrointestinal system: Abdomen is soft Central nervous system: Alert and awake Extremities: No edema Skin: No significant lesions noted Psychiatry: Flat affect.    Data Reviewed: I have personally reviewed following labs and imaging studies  CBC: Recent Labs  Lab 02/02/21 1024 02/03/21 1359 02/04/21 0450 02/05/21 0553 02/06/21 0556  WBC  --  10.1 9.3 6.8 6.7  NEUTROABS  --  5.8  --   --   --   HGB 7.0* 6.9* 8.0* 7.9* 7.4*  HCT 23.7* 23.8* 27.1* 27.0* 25.7*  MCV  --  94.1 91.6 91.5 92.4  PLT  --  372 311 313 269   Basic Metabolic Panel: Recent Labs  Lab 02/03/21 1359  NA 139  K 4.6  CL 105  CO2 27  GLUCOSE 95  BUN 13  CREATININE 0.45  CALCIUM 9.1   GFR: Estimated Creatinine Clearance: 50.7 mL/min (  by C-G formula based on SCr of 0.45 mg/dL). Liver Function Tests: No results for input(s): AST, ALT, ALKPHOS, BILITOT, PROT, ALBUMIN in the last 168 hours. No results for input(s): LIPASE, AMYLASE in the last 168 hours. No results for input(s): AMMONIA in the last 168 hours. Coagulation Profile: No results for input(s): INR, PROTIME in the last 168 hours. Cardiac Enzymes: No results for input(s): CKTOTAL, CKMB, CKMBINDEX, TROPONINI in the last 168  hours. BNP (last 3 results) No results for input(s): PROBNP in the last 8760 hours. HbA1C: No results for input(s): HGBA1C in the last 72 hours. CBG: Recent Labs  Lab 02/05/21 0752  GLUCAP 95   Lipid Profile: No results for input(s): CHOL, HDL, LDLCALC, TRIG, CHOLHDL, LDLDIRECT in the last 72 hours. Thyroid Function Tests: No results for input(s): TSH, T4TOTAL, FREET4, T3FREE, THYROIDAB in the last 72 hours. Anemia Panel: No results for input(s): VITAMINB12, FOLATE, FERRITIN, TIBC, IRON, RETICCTPCT in the last 72 hours. Sepsis Labs: No results for input(s): PROCALCITON, LATICACIDVEN in the last 168 hours.  Recent Results (from the past 240 hour(s))  SARS CORONAVIRUS 2 (TAT 6-24 HRS) Nasopharyngeal Nasopharyngeal Swab     Status: None   Collection Time: 02/03/21  4:42 PM   Specimen: Nasopharyngeal Swab  Result Value Ref Range Status   SARS Coronavirus 2 NEGATIVE NEGATIVE Final    Comment: (NOTE) SARS-CoV-2 target nucleic acids are NOT DETECTED.  The SARS-CoV-2 RNA is generally detectable in upper and lower respiratory specimens during the acute phase of infection. Negative results do not preclude SARS-CoV-2 infection, do not rule out co-infections with other pathogens, and should not be used as the sole basis for treatment or other patient management decisions. Negative results must be combined with clinical observations, patient history, and epidemiological information. The expected result is Negative.  Fact Sheet for Patients: SugarRoll.be  Fact Sheet for Healthcare Providers: https://www.woods-mathews.com/  This test is not yet approved or cleared by the [place] FDA and  has been authorized for detection and/or diagnosis of SARS-CoV-2 by FDA under an Emergency Use Authorization (EUA). This EUA will remain  in effect (meaning this test can be used) for the duration of the COVID-19 declaration under Se ction 564(b)(1) of  the Act, 21 U.S.C. section 360bbb-3(b)(1), unless the authorization is terminated or revoked sooner.  Performed at [facility], [place] [address] [place], [place] [address]          Radiology Studies: No results found.      Scheduled Meds: . sodium chloride   Intravenous Once  . fluticasone furoate-vilanterol  1 puff Inhalation Daily  . montelukast  10 mg Oral QHS  . pantoprazole (PROTONIX) IV  40 mg Intravenous Q12H  . sertraline  25 mg Oral Daily  . simvastatin  20 mg Oral Daily  . umeclidinium bromide  1 puff Inhalation Daily   Continuous Infusions: . sodium chloride 20 mL/hr at 02/05/21 1435     LOS: 2 days    Time spent: 35 minutes    Tiffnay Bossi Darleen Crocker, DO [facility]  If 7PM-7AM, please contact night-coverage [email or web address] 02/06/2021, 2:10 PM

## 2021-02-07 LAB — TYPE AND SCREEN
ABO/RH(D): A NEG
Antibody Screen: POSITIVE
Donor AG Type: NEGATIVE
Donor AG Type: NEGATIVE
PT AG Type: NEGATIVE
Unit division: 0
Unit division: 0

## 2021-02-07 LAB — CBC
HCT: 31.7 % — ABNORMAL LOW (ref 36.0–46.0)
Hemoglobin: 9.4 g/dL — ABNORMAL LOW (ref 12.0–15.0)
MCH: 27.4 pg (ref 26.0–34.0)
MCHC: 29.7 g/dL — ABNORMAL LOW (ref 30.0–36.0)
MCV: 92.4 fL (ref 80.0–100.0)
Platelets: 337 10*3/uL (ref 150–400)
RBC: 3.43 MIL/uL — ABNORMAL LOW (ref 3.87–5.11)
RDW: 15.4 % (ref 11.5–15.5)
WBC: 7.6 10*3/uL (ref 4.0–10.5)
nRBC: 0 % (ref 0.0–0.2)

## 2021-02-07 LAB — BPAM RBC
Blood Product Expiration Date: 202206032359
Blood Product Expiration Date: 202206032359
ISSUE DATE / TIME: 202205032233
ISSUE DATE / TIME: 202205032233
Unit Type and Rh: 600
Unit Type and Rh: 600

## 2021-02-07 MED ORDER — FERROUS SULFATE 325 (65 FE) MG PO TABS: 325.0000 mg | ORAL_TABLET | Freq: Every day | ORAL | Status: DC

## 2021-02-07 MED ORDER — FERROUS SULFATE 325 (65 FE) MG PO TABS: 325.0000 mg | ORAL_TABLET | Freq: Every day | ORAL | 3 refills | Status: DC

## 2021-02-07 MED ORDER — PANTOPRAZOLE SODIUM 40 MG PO TBEC: 40.0000 mg | DELAYED_RELEASE_TABLET | Freq: Two times a day (BID) | ORAL | 0 refills | Status: DC

## 2021-02-07 NOTE — Discharge Summary (Signed)
Physician Discharge Summary  Alexa Nichols HWE:[id number] DOB: 10-22-1945 DOA: 02/03/2021  PCP: [name], MD  Admit date: 02/03/2021  Discharge date: 02/07/2021  Admitted From:Home  Disposition:  Home  Recommendations for Outpatient Follow-up:  1. Follow up with PCP in 1-2 weeks 2. Follow-up with GI in the outpatient setting in the next 3-4 weeks with repeat CBC 3. Avoid further use of NSAIDs 4. Continue on PPI BID for 3 months  Home Health:None  Equipment/Devices:None  Discharge Condition:Stable  CODE STATUS: Full  Diet recommendation: Heart Healthy  Brief/Interim Summary: Alexa Nichols a 75 y.o.femalewith medical history significant forCOPD, asthma, mitral regurgitation. Patient presented to the ED with reports of low hemoglobin.GI had been consulted and has performed CT angiogram of the abdomen and pelvis with no acute findings.Patient had undergone capsule endoscopy with findings of some coffee-ground emesis. She has now undergone EGD as well with findings of gastric and duodenal AVMs and is status post APC.  She initially had some PRBC transfusions, but required another unit of PRBC on 5/6 due to worsening hemoglobin levels, but did not have any further overt bleeding noted.  She is able to tolerate a soft diet.  Her hemoglobin levels have remained stable and she has had no other acute overnight events noted.  She is stable for discharge today on Protonix twice daily with close follow-up with the gastroenterologist.  She has been recommended to remain off of NSAIDs.  Discharge Diagnoses:  Principal Problem:   Anemia Active Problems:   COPD (chronic obstructive pulmonary disease) (HCC)   Depression   Acute blood loss anemia  Principal discharge diagnosis: Acute on chronic blood loss anemia secondary to bleeding gastric and duodenal AVMs status post APC.  Discharge Instructions  Discharge Instructions    Ambulatory referral to Gastroenterology   Complete  by: As directed    Follow up for bleeding AVMs   Diet - low sodium heart healthy   Complete by: As directed    Increase activity slowly   Complete by: As directed      Allergies as of 02/07/2021      Reactions   Aspirin    samter's syndrome   Codeine Itching   Sulfa Antibiotics    Unknown reaction   Other Rash   gel filled fentanyl patch, adhesive on that patch caused ithcing      Medication List    STOP taking these medications   omeprazole 20 MG capsule Commonly known as: PRILOSEC   predniSONE 20 MG tablet Commonly known as: DELTASONE     TAKE these medications   acetaminophen 500 MG tablet Commonly known as: TYLENOL Take 500 mg by mouth every 6 (six) hours as needed for moderate pain or headache.   ALPRAZolam 0.5 MG tablet Commonly known as: XANAX Take 0.25 mg by mouth daily as needed for anxiety.   fentaNYL 25 MCG/HR Commonly known as: DURAGESIC Place 25 mcg onto the skin See admin instructions. Place 25 mcg onto the skin for 72 hours then remove as needed for pain   Flintstones Complete 18 MG Chew Chew 1 tablet by mouth 2 (two) times daily.   HYDROcodone-acetaminophen 5-325 MG tablet Commonly known as: NORCO/VICODIN Take 1 tablet by mouth 2 (two) times daily as needed for moderate pain.   montelukast 10 MG tablet Commonly known as: SINGULAIR Take 10 mg by mouth at bedtime.   pantoprazole 40 MG tablet Commonly known as: Protonix Take 1 tablet (40 mg total) by mouth 2 (two) times daily.  PreserVision AREDS 2 Caps Take 1 capsule by mouth in the morning and at bedtime.   sertraline 50 MG tablet Commonly known as: ZOLOFT Take 25 mg by mouth daily.   simvastatin 20 MG tablet Commonly known as: ZOCOR Take 20 mg by mouth daily.   tiZANidine 4 MG tablet Commonly known as: ZANAFLEX Take 2 mg by mouth at bedtime as needed for muscle spasms.   Trelegy Ellipta 200-62.5-25 MCG/INH Aepb Generic drug: Fluticasone-Umeclidin-Vilant Inhale 1 puff into the  lungs daily.       Follow-up Information    [name], [name], MD. Schedule an appointment as soon as possible for a visit in 1 week(s).   Specialty: Internal Medicine Contact information: [place] [address] [phone]        [facility] Follow up in 1 month(s).   Contact information: [place] [phone]             Allergies  Allergen Reactions  . Aspirin     samter's syndrome  . Codeine Itching  . Sulfa Antibiotics     Unknown reaction  . Other Rash    gel filled fentanyl patch, adhesive on that patch caused ithcing    Consultations: GI   Procedures/Studies: CT Angio Abd/Pel w/ and/or w/o  Result Date: 02/04/2021 CLINICAL DATA:  Obscure GI bleed, transfusion dependent anemia. EXAM: CTA ABDOMEN AND PELVIS WITHOUT AND WITH CONTRAST TECHNIQUE: Multidetector CT imaging of the abdomen and pelvis was performed using the standard protocol during bolus administration of intravenous contrast. Multiplanar reconstructed images and MIPs were obtained and reviewed to evaluate the vascular anatomy. CONTRAST:  142mL OMNIPAQUE IOHEXOL 350 MG/ML SOLN COMPARISON:  12/07/2015 FINDINGS: VASCULAR Aorta: Mild calcified atheromatous plaque. No aneurysm, dissection, or stenosis. Celiac: Patent.  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 SMA: Patent without evidence of aneurysm, dissection, vasculitis or significant stenosis. Renals: Both renal arteries are patent without evidence of aneurysm, dissection, vasculitis, fibromuscular dysplasia or significant stenosis. IMA: Patent without evidence of aneurysm, dissection, vasculitis or significant stenosis. Inflow: Patent without evidence of aneurysm, dissection, vasculitis or significant stenosis. Proximal Outflow: Bilateral common femoral and visualized portions of the superficial and profunda femoral arteries are patent without evidence of aneurysm, dissection,  vasculitis or significant stenosis. Veins: Patent hepatic veins, portal vein, SMV, splenic vein, bilateral renal veins, iliac venous system and IVC. No venous pathology identified. Review of the MIP images confirms the above findings. NON-VASCULAR Lower chest: No pleural or pericardial effusion. Minimal linear scarring in the lung bases. Hepatobiliary: No focal liver abnormality is seen. Status post cholecystectomy. No biliary dilatation. Pancreas: Unremarkable. No pancreatic ductal dilatation or surrounding inflammatory changes. Spleen: Normal in size without focal abnormality. Adrenals/Urinary Tract: Adrenal glands unremarkable. Normal renal enhancement. No hydronephrosis. Urinary bladder physiologically distended. Stomach/Bowel: Stomach is nondistended. 6.1 cm posterior gastric diverticulum (previously 4.9) containing fluid level and hyperdense material. No associated wall thickening nor inflammatory change. The small bowel is nondilated. Normal appendix. The colon is nondilated, unremarkable. No evidence of active extravasation. Lymphatic: No abdominal or pelvic adenopathy. Reproductive: Uterus and bilateral adnexa are unremarkable. Other: No ascites.  No free air. Musculoskeletal: Lower lumbar spondylitic changes. No fracture or worrisome bone lesion. IMPRESSION: 1. No evidence of active extravasation into the bowel. 2. Slight interval enlargement of posterior gastric diverticulum without adjacent inflammatory/edematous change. Electronically Signed   By: Lucrezia Europe M.D.   On: 02/04/2021 13:40      Discharge Exam: Vitals:  02/07/21 0522 02/07/21 0749  BP: (!) 151/67   Pulse: 67   Resp: 16   Temp: 97.7 F (36.5 C)   SpO2: 97% 97%   Vitals:   02/06/21 1927 02/06/21 2049 02/07/21 0522 02/07/21 0749  BP: 136/65 (!) 130/94 (!) 151/67   Pulse: 77 69 67   Resp: 18 18 16    Temp: 99.1 F (37.3 C) 98.9 F (37.2 C) 97.7 F (36.5 C)   TempSrc: Oral Oral Oral   SpO2: 99% 98% 97% 97%  Weight:         General: Pt is alert, awake, not in acute distress Cardiovascular: RRR, S1/S2 +, no rubs, no gallops Respiratory: CTA bilaterally, no wheezing, no rhonchi Abdominal: Soft, NT, ND, bowel sounds + Extremities: no edema, no cyanosis    The results of significant diagnostics from this hospitalization (including imaging, microbiology, ancillary and laboratory) are listed below for reference.     Microbiology: Recent Results (from the past 240 hour(s))  SARS CORONAVIRUS 2 (TAT 6-24 HRS) Nasopharyngeal Nasopharyngeal Swab     Status: None   Collection Time: 02/03/21  4:42 PM   Specimen: Nasopharyngeal Swab  Result Value Ref Range Status   SARS Coronavirus 2 NEGATIVE NEGATIVE Final    Comment: (NOTE) SARS-CoV-2 target nucleic acids are NOT DETECTED.  The SARS-CoV-2 RNA is generally detectable in upper and lower respiratory specimens during the acute phase of infection. Negative results do not preclude SARS-CoV-2 infection, do not rule out co-infections with other pathogens, and should not be used as the sole basis for treatment or other patient management decisions. Negative results must be combined with clinical observations, patient history, and epidemiological information. The expected result is Negative.  Fact Sheet for Patients: SugarRoll.be  Fact Sheet for Healthcare Providers: https://www.woods-mathews.com/  This test is not yet approved or cleared by the [place] FDA and  has been authorized for detection and/or diagnosis of SARS-CoV-2 by FDA under an Emergency Use Authorization (EUA). This EUA will remain  in effect (meaning this test can be used) for the duration of the COVID-19 declaration under Se ction 564(b)(1) of the Act, 21 U.S.C. section 360bbb-3(b)(1), unless the authorization is terminated or revoked sooner.  Performed at [facility], [place] [address], [place], [place] [address]      Labs: BNP (last 3  results) No results for input(s): BNP in the last 8760 hours. Basic Metabolic Panel: Recent Labs  Lab 02/03/21 1359  NA 139  K 4.6  CL 105  CO2 27  GLUCOSE 95  BUN 13  CREATININE 0.45  CALCIUM 9.1   Liver Function Tests: No results for input(s): AST, ALT, ALKPHOS, BILITOT, PROT, ALBUMIN in the last 168 hours. No results for input(s): LIPASE, AMYLASE in the last 168 hours. No results for input(s): AMMONIA in the last 168 hours. CBC: Recent Labs  Lab 02/03/21 1359 02/04/21 0450 02/05/21 0553 02/06/21 0556 02/06/21 2028 02/07/21 0536  WBC 10.1 9.3 6.8 6.7  --  7.6  NEUTROABS 5.8  --   --   --   --   --   HGB 6.9* 8.0* 7.9* 7.4* 9.5* 9.4*  HCT 23.8* 27.1* 27.0* 25.7* 31.5* 31.7*  MCV 94.1 91.6 91.5 92.4  --  92.4  PLT 372 311 313 316  --  337   Cardiac Enzymes: No results for input(s): CKTOTAL, CKMB, CKMBINDEX, TROPONINI in the last 168 hours. BNP: Invalid input(s): POCBNP CBG: Recent Labs  Lab 02/05/21 0752  GLUCAP 95   D-Dimer  No results for input(s): DDIMER in the last 72 hours. Hgb A1c No results for input(s): HGBA1C in the last 72 hours. Lipid Profile No results for input(s): CHOL, HDL, LDLCALC, TRIG, CHOLHDL, LDLDIRECT in the last 72 hours. Thyroid function studies No results for input(s): TSH, T4TOTAL, T3FREE, THYROIDAB in the last 72 hours.  Invalid input(s): FREET3 Anemia work up No results for input(s): VITAMINB12, FOLATE, FERRITIN, TIBC, IRON, RETICCTPCT in the last 72 hours. Urinalysis No results found for: COLORURINE, APPEARANCEUR, [place], [place], [place], [place], [place], [place], PROTEINUR, UROBILINOGEN, NITRITE, LEUKOCYTESUR Sepsis Labs Invalid input(s): PROCALCITONIN,  WBC,  LACTICIDVEN Microbiology Recent Results (from the past 240 hour(s))  SARS CORONAVIRUS 2 (TAT 6-24 HRS) Nasopharyngeal Nasopharyngeal Swab     Status: None   Collection Time: 02/03/21  4:42 PM   Specimen: Nasopharyngeal Swab  Result Value Ref Range Status    SARS Coronavirus 2 NEGATIVE NEGATIVE Final    Comment: (NOTE) SARS-CoV-2 target nucleic acids are NOT DETECTED.  The SARS-CoV-2 RNA is generally detectable in upper and lower respiratory specimens during the acute phase of infection. Negative results do not preclude SARS-CoV-2 infection, do not rule out co-infections with other pathogens, and should not be used as the sole basis for treatment or other patient management decisions. Negative results must be combined with clinical observations, patient history, and epidemiological information. The expected result is Negative.  Fact Sheet for Patients: SugarRoll.be  Fact Sheet for Healthcare Providers: https://www.woods-mathews.com/  This test is not yet approved or cleared by the [place] FDA and  has been authorized for detection and/or diagnosis of SARS-CoV-2 by FDA under an Emergency Use Authorization (EUA). This EUA will remain  in effect (meaning this test can be used) for the duration of the COVID-19 declaration under Se ction 564(b)(1) of the Act, 21 U.S.C. section 360bbb-3(b)(1), unless the authorization is terminated or revoked sooner.  Performed at [facility], [place] [address], [place], [place] [address]      Time coordinating discharge: 35 minutes  SIGNED:   Rodena Goldmann, DO [facility] 02/07/2021, 10:41 AM  If 7PM-7AM, please contact night-coverage [email or web address]

## 2021-02-07 NOTE — Progress Notes (Signed)
Alexa Nichols, M.D. Gastroenterology & Hepatology   Interval History:  Patient reports feeling well, denies any complaints today. The patient received 1 unit PRBC yesterday with adequate response after well as her hemoglobin was 9.5 yesterday, repeat today was 9.4. She denies having any melena or hematochezia for the last few days.  No abdominal pain, nausea, vomiting, fever or chills.  Has tolerated diet adequately.  Inpatient Medications:  Current Facility-Administered Medications:  .  0.9 %  sodium chloride infusion, , Intravenous, Continuous, Annitta Needs, NP, Last Rate: 20 mL/hr at 02/05/21 1435, New Bag at 02/05/21 1435 .  acetaminophen (TYLENOL) tablet 650 mg, 650 mg, Oral, Q6H PRN, 650 mg at 02/07/21 0914 **OR** acetaminophen (TYLENOL) suppository 650 mg, 650 mg, Rectal, Q6H PRN, Rourk, Cristopher Estimable, MD .  fluticasone furoate-vilanterol (BREO ELLIPTA) 100-25 MCG/INH 1 puff, 1 puff, Inhalation, Daily, Rourk, Cristopher Estimable, MD, 1 puff at 02/07/21 0749 .  HYDROcodone-acetaminophen (NORCO/VICODIN) 5-325 MG per tablet 1 tablet, 1 tablet, Oral, BID PRN, [name], MD, 1 tablet at 02/06/21 2206 .  montelukast (SINGULAIR) tablet 10 mg, 10 mg, Oral, QHS, Rourk, Cristopher Estimable, MD, 10 mg at 02/06/21 2206 .  ondansetron (ZOFRAN) tablet 4 mg, 4 mg, Oral, Q6H PRN **OR** ondansetron (ZOFRAN) injection 4 mg, 4 mg, Intravenous, Q6H PRN, Rourk, Cristopher Estimable, MD .  pantoprazole (PROTONIX) injection 40 mg, 40 mg, Intravenous, Q12H, Rourk, Cristopher Estimable, MD, 40 mg at 02/07/21 0915 .  polyethylene glycol (MIRALAX / GLYCOLAX) packet 17 g, 17 g, Oral, Daily PRN, Rourk, Cristopher Estimable, MD .  sertraline (ZOLOFT) tablet 25 mg, 25 mg, Oral, Daily, Rourk, Cristopher Estimable, MD, 25 mg at 02/07/21 0915 .  simvastatin (ZOCOR) tablet 20 mg, 20 mg, Oral, Daily, Rourk, Cristopher Estimable, MD, 20 mg at 02/07/21 0915 .  traZODone (DESYREL) tablet 50 mg, 50 mg, Oral, QHS PRN, [name], MD, 50 mg at 02/06/21 2206 .  umeclidinium bromide (INCRUSE ELLIPTA)  62.5 MCG/INH 1 puff, 1 puff, Inhalation, Daily, Rourk, Cristopher Estimable, MD, 1 puff at 02/07/21 0749   I/O    Intake/Output Summary (Last 24 hours) at 02/07/2021 1045 Last data filed at 02/07/2021 0400 Gross per 24 hour  Intake 1647.46 ml  Output --  Net 1647.46 ml     Physical Exam: Temp:  [97.7 F (36.5 C)-99.3 F (37.4 C)] 97.7 F (36.5 C) (05/07 0522) Pulse Rate:  [67-78] 67 (05/07 0522) Resp:  [16-18] 16 (05/07 0522) BP: (130-171)/(65-94) 151/67 (05/07 0522) SpO2:  [94 %-99 %] 97 % (05/07 0749)  Temp (24hrs), Avg:98.8 F (37.1 C), Min:97.7 F (36.5 C), Max:99.3 F (37.4 C)  GENERAL: The patient is AO x3, in no acute distress. HEENT: Head is normocephalic and atraumatic. EOMI are intact. Mouth is well hydrated and without lesions. NECK: Supple. No masses LUNGS: Clear to auscultation. No presence of rhonchi/wheezing/rales. Adequate chest expansion HEART: RRR, normal s1 and s2. ABDOMEN: Soft, nontender, no guarding, no peritoneal signs, and nondistended. BS +. No masses. EXTREMITIES: Without any cyanosis, clubbing, rash, lesions or edema. NEUROLOGIC: AOx3, no focal motor deficit. SKIN: no jaundice, no rashes  Laboratory Data: CBC:     Component Value Date/Time   WBC 7.6 02/07/2021 0536   RBC 3.43 (L) 02/07/2021 0536   HGB 9.4 (L) 02/07/2021 0536   HGB 7.0 (LL) 02/02/2021 1024   HCT 31.7 (L) 02/07/2021 0536   HCT 23.7 (L) 02/02/2021 1024   PLT 337 02/07/2021 0536   PLT 382 01/26/2021 1503   MCV 92.4  02/07/2021 0536   MCV 90 01/26/2021 1503   MCH 27.4 02/07/2021 0536   MCHC 29.7 (L) 02/07/2021 0536   RDW 15.4 02/07/2021 0536   RDW 13.6 01/26/2021 1503   LYMPHSABS 2.8 02/03/2021 1359   MONOABS 1.0 02/03/2021 1359   EOSABS 0.4 02/03/2021 1359   BASOSABS 0.1 02/03/2021 1359   COAG: No results found for: INR, PROTIME  BMP:  BMP Latest Ref Rng & Units 02/03/2021 12/01/2020  Glucose 70 - 99 mg/dL 95 107(H)  BUN 8 - 23 mg/dL 13 10  Creatinine 0.44 - 1.00 mg/dL 0.45 0.49   Sodium 135 - 145 mmol/L 139 137  Potassium 3.5 - 5.1 mmol/L 4.6 4.1  Chloride 98 - 111 mmol/L 105 100  CO2 22 - 32 mmol/L 27 27  Calcium 8.9 - 10.3 mg/dL 9.1 9.3    HEPATIC: No flowsheet data found.  CARDIAC: No results found for: CKTOTAL, CKMB, CKMBINDEX, TROPONINI    Imaging: I personally reviewed and interpreted the available labs, imaging and endoscopic files.   Assessment/Plan: Briefly, this is a 75 year old female with past medical history of asthma, COPD, depression, GERD and small bowel AVMs, who was admitted to the hospital after presenting severe anemia.  The patient was transfused 2 units of PRBC upon admission as she was found to have severe iron deficiency and hemoglobin of 5.6.  The patient underwent a CTA of the abdomen which showed patent vessels her abdomen.  A capsule study was performed which showed small bowel AVMs.  The disease she underwent an EGD that showed a nonobstructive Schatzki's ring, gastric diverticulum, small hiatal hernia, gastric polyps, as well as gastric and duodenal AVMs which were ablated with APC successfully.  The patient has remained hemodynamically stable after her procedure and has not present any further drop in her hemoglobin, responded to transfusion yesterday.  As she has not presented any episodes of active gastrointestinal bleeding or recurrent anemia, she can continue advancing her diet and will need to follow-up in the GI clinic.  I will recommend discharging her on oral iron supplementation.  # Gastric and small bowel AVMs # Iron deficiency anemia - Repeat CBC qday, transfuse if Hb <7 - Switch Pantoprazole 40 mg qday - Start ferrous sulfate 325 mg qday - 2 large bore IV lines - Active T/S - advance diet as tolerated - Avoid NSAIDs - Patient will follow up in GI clinic in 3-4 weeks. - GI service will sign-off, please call us back if you have any more questions.  Alexa Peppers, MD Gastroenterology and Hepatology [facility]

## 2021-02-10 ENCOUNTER — Encounter (HOSPITAL_COMMUNITY): Payer: Self-pay | Admitting: Internal Medicine

## 2021-02-11 ENCOUNTER — Encounter (HOSPITAL_COMMUNITY): Payer: Self-pay | Admitting: Internal Medicine

## 2021-02-12 ENCOUNTER — Encounter (INDEPENDENT_AMBULATORY_CARE_PROVIDER_SITE_OTHER): Payer: Medicare Other | Admitting: Ophthalmology

## 2021-02-13 DIAGNOSIS — I10 Essential (primary) hypertension: Secondary | ICD-10-CM | POA: Diagnosis not present

## 2021-02-13 DIAGNOSIS — J449 Chronic obstructive pulmonary disease, unspecified: Secondary | ICD-10-CM | POA: Diagnosis not present

## 2021-02-13 DIAGNOSIS — Z299 Encounter for prophylactic measures, unspecified: Secondary | ICD-10-CM | POA: Diagnosis not present

## 2021-02-13 DIAGNOSIS — D649 Anemia, unspecified: Secondary | ICD-10-CM | POA: Diagnosis not present

## 2021-02-13 DIAGNOSIS — I700 Atherosclerosis of aorta: Secondary | ICD-10-CM | POA: Diagnosis not present

## 2021-02-13 DIAGNOSIS — R29898 Other symptoms and signs involving the musculoskeletal system: Secondary | ICD-10-CM | POA: Diagnosis not present

## 2021-02-16 ENCOUNTER — Telehealth (INDEPENDENT_AMBULATORY_CARE_PROVIDER_SITE_OTHER): Payer: Self-pay | Admitting: Internal Medicine

## 2021-02-16 ENCOUNTER — Emergency Department (HOSPITAL_COMMUNITY): Payer: Medicare Other

## 2021-02-16 ENCOUNTER — Other Ambulatory Visit: Payer: Self-pay

## 2021-02-16 ENCOUNTER — Inpatient Hospital Stay (HOSPITAL_COMMUNITY)
Admission: EM | Admit: 2021-02-16 | Discharge: 2021-02-23 | DRG: 096 | Disposition: A | Discharge status: Skilled Nursing Facility | Payer: Medicare Other | Attending: Internal Medicine | Admitting: Internal Medicine

## 2021-02-16 ENCOUNTER — Encounter (HOSPITAL_COMMUNITY): Payer: Self-pay | Admitting: *Deleted

## 2021-02-16 DIAGNOSIS — J449 Chronic obstructive pulmonary disease, unspecified: Secondary | ICD-10-CM | POA: Diagnosis present

## 2021-02-16 DIAGNOSIS — E785 Hyperlipidemia, unspecified: Secondary | ICD-10-CM | POA: Diagnosis present

## 2021-02-16 DIAGNOSIS — D329 Benign neoplasm of meninges, unspecified: Secondary | ICD-10-CM | POA: Diagnosis not present

## 2021-02-16 DIAGNOSIS — D649 Anemia, unspecified: Secondary | ICD-10-CM | POA: Diagnosis not present

## 2021-02-16 DIAGNOSIS — J302 Other seasonal allergic rhinitis: Secondary | ICD-10-CM | POA: Diagnosis present

## 2021-02-16 DIAGNOSIS — Z20822 Contact with and (suspected) exposure to covid-19: Secondary | ICD-10-CM | POA: Diagnosis present

## 2021-02-16 DIAGNOSIS — Z91048 Other nonmedicinal substance allergy status: Secondary | ICD-10-CM

## 2021-02-16 DIAGNOSIS — E538 Deficiency of other specified B group vitamins: Secondary | ICD-10-CM | POA: Diagnosis present

## 2021-02-16 DIAGNOSIS — D8689 Sarcoidosis of other sites: Secondary | ICD-10-CM

## 2021-02-16 DIAGNOSIS — G8929 Other chronic pain: Secondary | ICD-10-CM | POA: Diagnosis present

## 2021-02-16 DIAGNOSIS — M545 Low back pain, unspecified: Secondary | ICD-10-CM | POA: Diagnosis present

## 2021-02-16 DIAGNOSIS — G610 Guillain-Barre syndrome: Principal | ICD-10-CM | POA: Diagnosis present

## 2021-02-16 DIAGNOSIS — F32A Depression, unspecified: Secondary | ICD-10-CM | POA: Diagnosis not present

## 2021-02-16 DIAGNOSIS — K219 Gastro-esophageal reflux disease without esophagitis: Secondary | ICD-10-CM | POA: Diagnosis present

## 2021-02-16 DIAGNOSIS — Z853 Personal history of malignant neoplasm of breast: Secondary | ICD-10-CM

## 2021-02-16 DIAGNOSIS — Z79899 Other long term (current) drug therapy: Secondary | ICD-10-CM

## 2021-02-16 DIAGNOSIS — Z87891 Personal history of nicotine dependence: Secondary | ICD-10-CM

## 2021-02-16 DIAGNOSIS — Z86000 Personal history of in-situ neoplasm of breast: Secondary | ICD-10-CM

## 2021-02-16 DIAGNOSIS — R29898 Other symptoms and signs involving the musculoskeletal system: Secondary | ICD-10-CM | POA: Diagnosis present

## 2021-02-16 DIAGNOSIS — Z803 Family history of malignant neoplasm of breast: Secondary | ICD-10-CM

## 2021-02-16 DIAGNOSIS — R531 Weakness: Secondary | ICD-10-CM | POA: Diagnosis not present

## 2021-02-16 DIAGNOSIS — R9431 Abnormal electrocardiogram [ECG] [EKG]: Secondary | ICD-10-CM | POA: Diagnosis not present

## 2021-02-16 DIAGNOSIS — Z7951 Long term (current) use of inhaled steroids: Secondary | ICD-10-CM

## 2021-02-16 DIAGNOSIS — Z882 Allergy status to sulfonamides status: Secondary | ICD-10-CM

## 2021-02-16 DIAGNOSIS — Z888 Allergy status to other drugs, medicaments and biological substances status: Secondary | ICD-10-CM

## 2021-02-16 DIAGNOSIS — Z885 Allergy status to narcotic agent status: Secondary | ICD-10-CM

## 2021-02-16 DIAGNOSIS — J3489 Other specified disorders of nose and nasal sinuses: Secondary | ICD-10-CM | POA: Diagnosis not present

## 2021-02-16 DIAGNOSIS — J329 Chronic sinusitis, unspecified: Secondary | ICD-10-CM | POA: Diagnosis not present

## 2021-02-16 LAB — CBC WITH DIFFERENTIAL/PLATELET
Abs Immature Granulocytes: 0.08 10*3/uL — ABNORMAL HIGH (ref 0.00–0.07)
Basophils Absolute: 0.1 10*3/uL (ref 0.0–0.1)
Basophils Relative: 1 %
Eosinophils Absolute: 0.4 10*3/uL (ref 0.0–0.5)
Eosinophils Relative: 3 %
HCT: 40 % (ref 36.0–46.0)
Hemoglobin: 11.9 g/dL — ABNORMAL LOW (ref 12.0–15.0)
Immature Granulocytes: 1 %
Lymphocytes Relative: 20 %
Lymphs Abs: 2.3 10*3/uL (ref 0.7–4.0)
MCH: 27.4 pg (ref 26.0–34.0)
MCHC: 29.8 g/dL — ABNORMAL LOW (ref 30.0–36.0)
MCV: 92.2 fL (ref 80.0–100.0)
Monocytes Absolute: 1.3 10*3/uL — ABNORMAL HIGH (ref 0.1–1.0)
Monocytes Relative: 11 %
Neutro Abs: 7.7 10*3/uL (ref 1.7–7.7)
Neutrophils Relative %: 64 %
Platelets: 406 10*3/uL — ABNORMAL HIGH (ref 150–400)
RBC: 4.34 MIL/uL (ref 3.87–5.11)
RDW: 15.2 % (ref 11.5–15.5)
WBC: 11.8 10*3/uL — ABNORMAL HIGH (ref 4.0–10.5)
nRBC: 0 % (ref 0.0–0.2)

## 2021-02-16 LAB — RESP PANEL BY RT-PCR (FLU A&B, COVID) ARPGX2
Influenza A by PCR: NEGATIVE
Influenza B by PCR: NEGATIVE
SARS Coronavirus 2 by RT PCR: NEGATIVE

## 2021-02-16 LAB — BASIC METABOLIC PANEL
Anion gap: 11 (ref 5–15)
BUN: 14 mg/dL (ref 8–23)
CO2: 26 mmol/L (ref 22–32)
Calcium: 10 mg/dL (ref 8.9–10.3)
Chloride: 101 mmol/L (ref 98–111)
Creatinine, Ser: 0.59 mg/dL (ref 0.44–1.00)
GFR, Estimated: >60 mL/min (ref >60)
Glucose, Bld: 117 mg/dL — ABNORMAL HIGH (ref 70–99)
Potassium: 4.5 mmol/L (ref 3.5–5.1)
Sodium: 138 mmol/L (ref 135–145)

## 2021-02-16 LAB — TROPONIN I (HIGH SENSITIVITY)
Troponin I (High Sensitivity): 4 ng/L (ref <18)
Troponin I (High Sensitivity): 5 ng/L (ref <18)

## 2021-02-16 LAB — URINALYSIS, ROUTINE W REFLEX MICROSCOPIC
Bilirubin Urine: NEGATIVE
Glucose, UA: NEGATIVE mg/dL
Hgb urine dipstick: NEGATIVE
Ketones, ur: NEGATIVE mg/dL
Leukocytes,Ua: NEGATIVE
Nitrite: NEGATIVE
Protein, ur: NEGATIVE mg/dL
Specific Gravity, Urine: 1.008 (ref 1.005–1.030)
pH: 5 (ref 5.0–8.0)

## 2021-02-16 IMAGING — CT CT HEAD W/O CM
3 series · 15 of 47 positions shown, 18 images · non-contrast
Comparison: None.

CLINICAL DATA: Rule out stroke.  Bilateral leg weakness for 2 weeks

EXAM:
CT HEAD WITHOUT CONTRAST
TECHNIQUE: Contiguous axial images were obtained from the base of the skull
through the vertex without intravenous contrast.

[Series 2: head w o · axial · 0.41mm/px · z∈[-5,+120]mm · 9 of 31 slices shown, 12 images]
[im 3/31  brain]
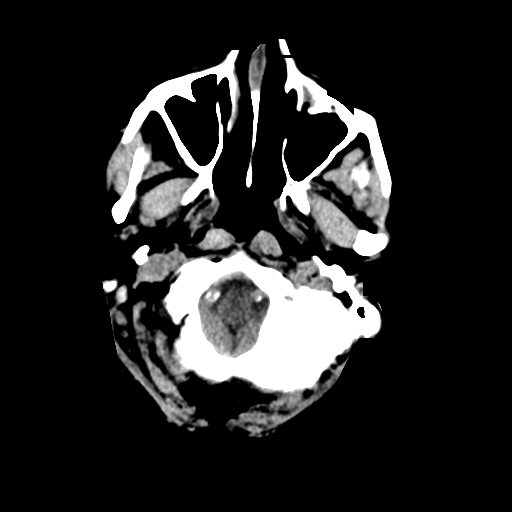
[im 3/31  bone]
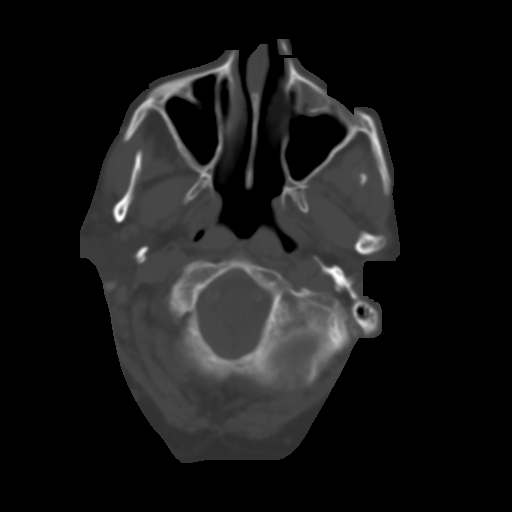
[im 6/31  brain]
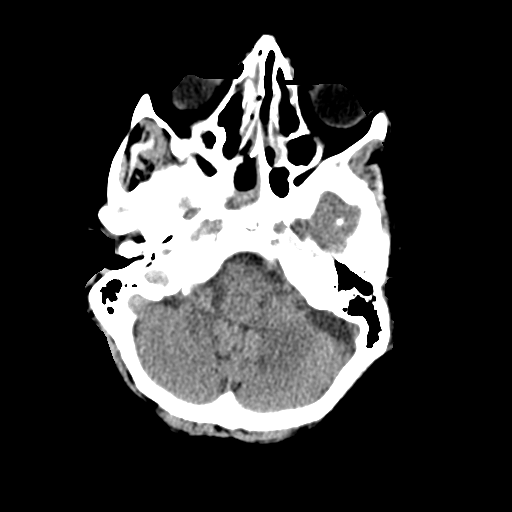
[im 9/31  brain]
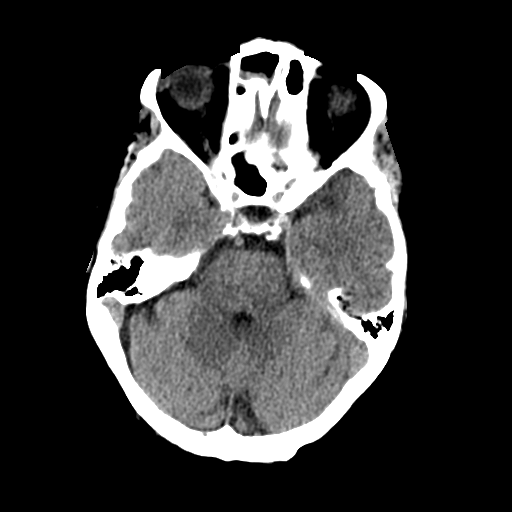
[im 12/31  brain]
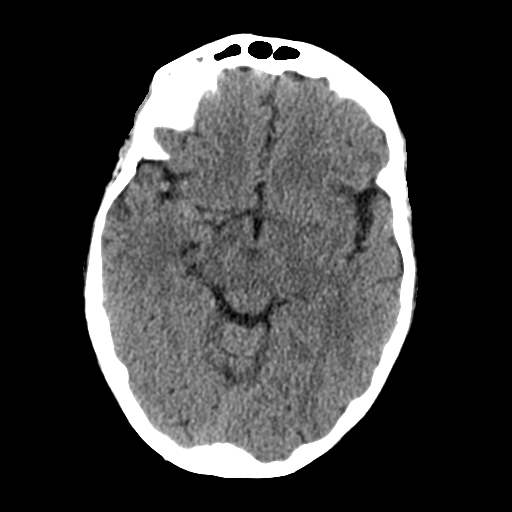
[im 16/31  brain]
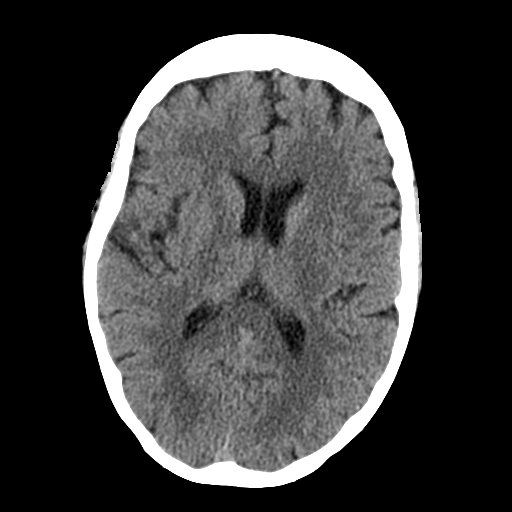
[im 16/31  bone]
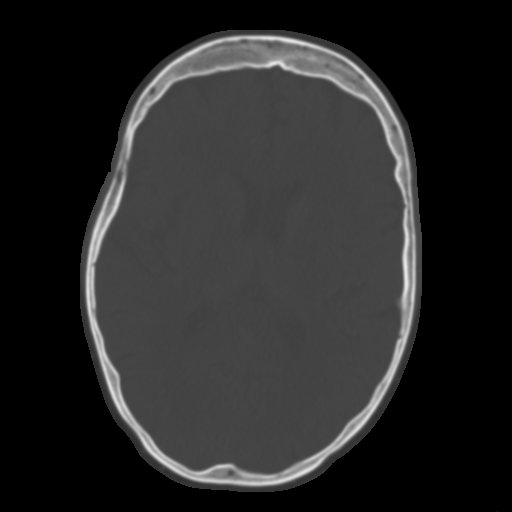
[im 19/31  brain]
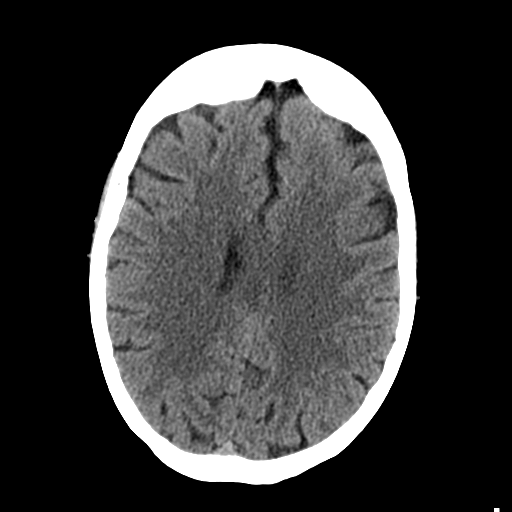
[im 22/31  brain]
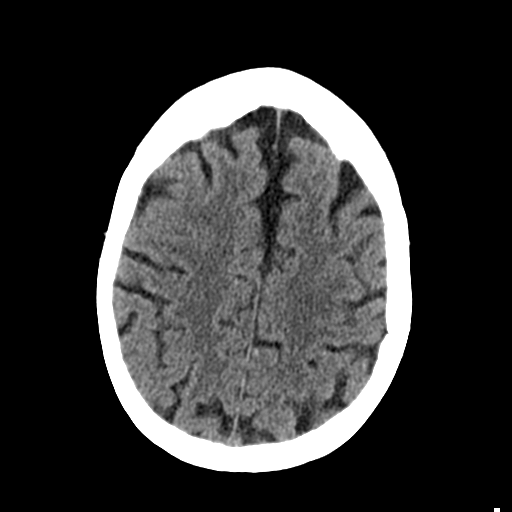
[im 25/31  brain]
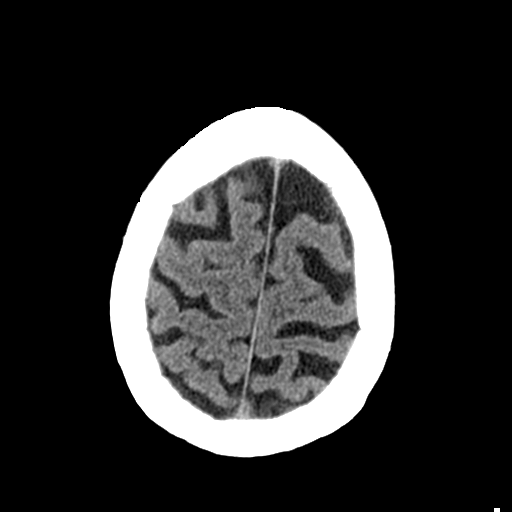
[im 28/31  brain]
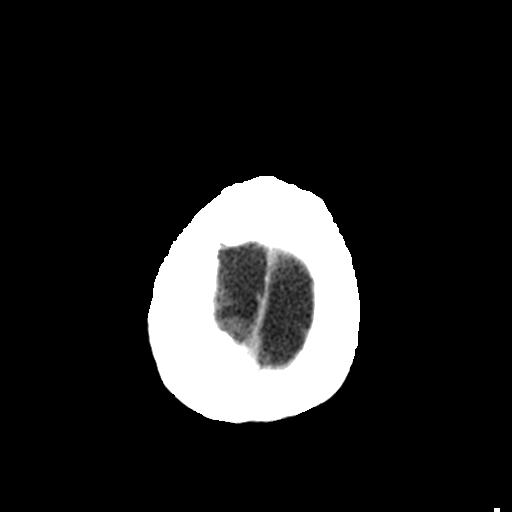
[im 28/31  bone]
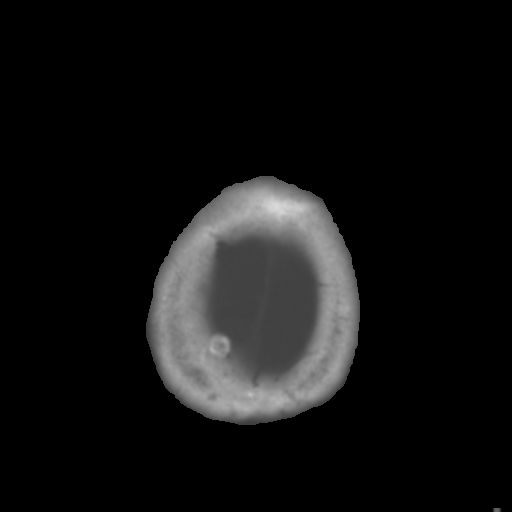

[Series 4: coronal soft · coronal · 0.32mm/px · 3 of 68 slices shown]
[im 23/68  brain]
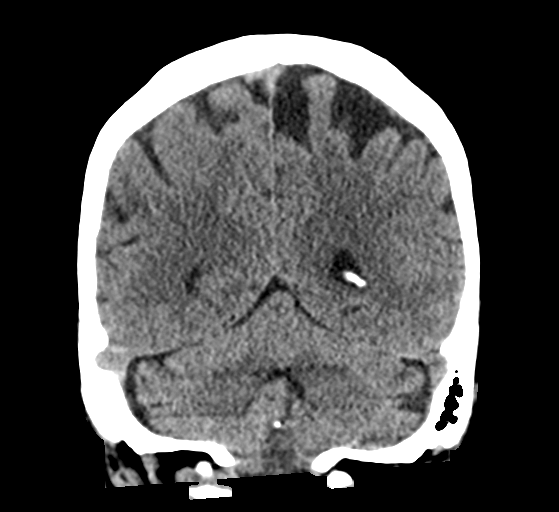
[im 30/68  brain]
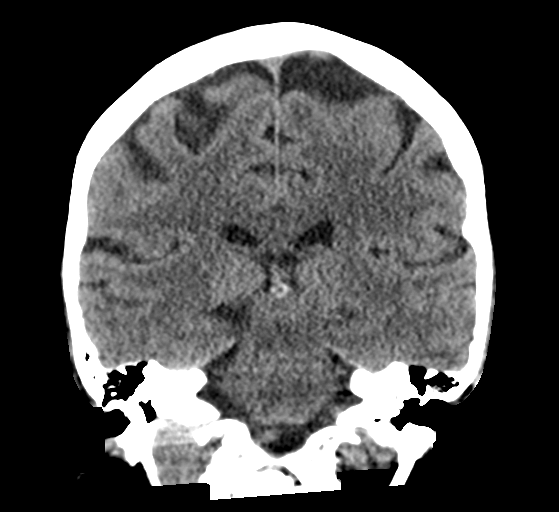
[im 38/68  brain]
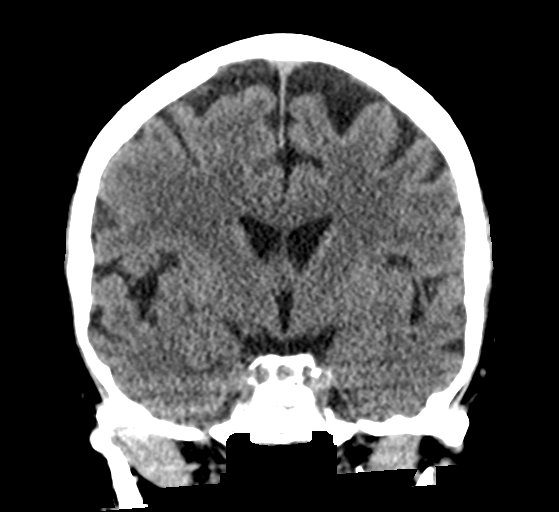

[Series 5: sagittal soft · sagittal · 0.31mm/px · 3 of 53 slices shown]
[im 18/53  brain]
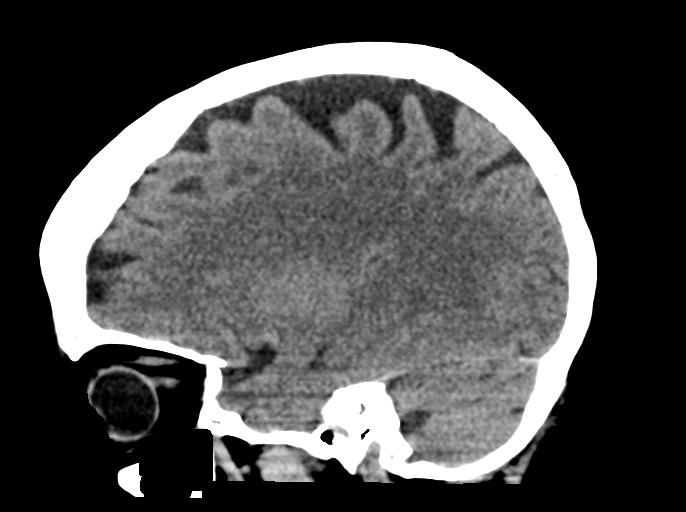
[im 27/53  brain]
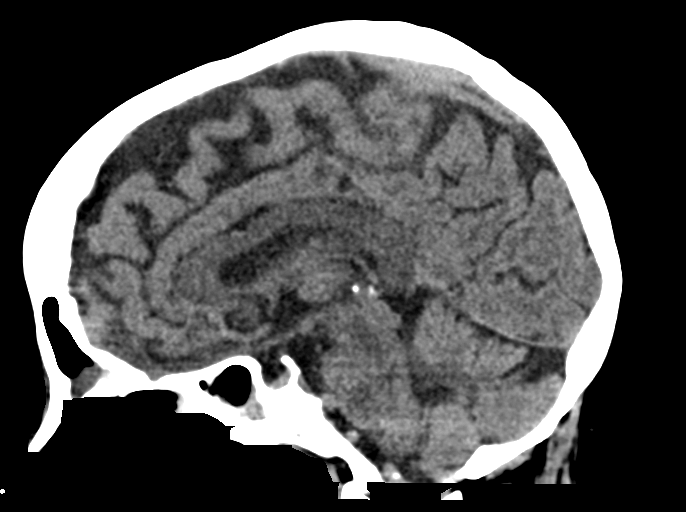
[im 35/53  brain]
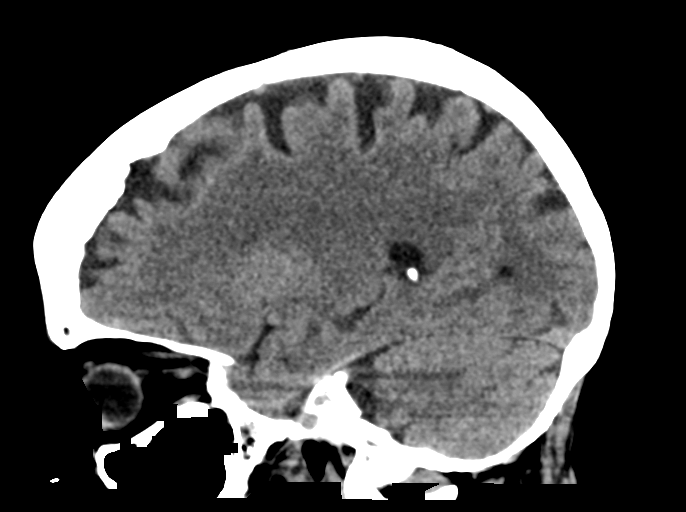

[15 of 47 positions shown; findings below may reference images not displayed]

FINDINGS: Brain: No evidence of acute infarction, hemorrhage, hydrocephalus,
extra-axial collection or mass lesion/mass effect.

Vascular: No hyperdense vessel or unexpected calcification.

Skull: Normal. Negative for fracture or focal lesion.

Sinuses/Orbits: Status post bilateral maxillary sinus median
antrectomies. Moderate diffuse mucosal thickening noted involving
the maxillary sinuses, sphenoid sinuses and ethmoid air cells.
Mastoid air cells appear clear.

Other: None.
IMPRESSION: 1. No acute intracranial abnormalities.
2. Chronic sinus inflammation.

## 2021-02-16 MED ORDER — HYDROCODONE-ACETAMINOPHEN 5-325 MG PO TABS
1.0000 | ORAL_TABLET | Freq: Once | ORAL | Status: AC
  Administered 2021-02-16: 1 via ORAL
  Filled 2021-02-16: qty 1

## 2021-02-16 NOTE — Telephone Encounter (Signed)
Talked with the patient. She saw Dr. Woody Seller on Friday , May 13. He has ordered Physical Therapy for her. Patient states that she is getting weaker, experiences shortness of breathe when trying to walk with the assistance of a walker. She does not experience shortness of breathe. No appetite, drinking water and some Gatorade. Patient states that her legs are numb, almost fell twice last week but husband caught her and laid her down. She had procedure on May 5 with Dr. Gala Romney. Melena , Non -obstructing Schatzki Ring, Small Hiatal Hernia.  Patient is asking if Dr. Laural Golden may have any advice or if she may get lab work. Addressed with Dr. Laural Golden.

## 2021-02-16 NOTE — ED Triage Notes (Signed)
Pt with generalized weakness since discharge and has been getting worse. Pt c/o numbness to feet.  Called PCP concerning her weakness.

## 2021-02-16 NOTE — Telephone Encounter (Signed)
After review Dr. Laural Golden feels that the patient should go to ED for further evaluation. She has many symptoms. Patient was called and made aware. She will tell her husband when he gets home and they will decide to go to [facility] or [facility] ED for further evaluation.

## 2021-02-16 NOTE — Telephone Encounter (Signed)
Patient called the office states she was in the hospital for several days - states she is weaker now than she was before she was hospitalized - states she saw her PCP on Friday, 5/13 - states he said he couldn't give her anything - says she has to use a walker to get around - wanted to know if Dr Laural Golden could suggest something for her to take - please advise - ph# [phone]

## 2021-02-16 NOTE — ED Provider Notes (Signed)
[facility] EMERGENCY DEPARTMENT Provider Note   CSN: [id number] Arrival date & time: 02/16/21  1352     History Chief Complaint  Patient presents with  . Weakness    Alexa Nichols is a 75 y.o. female.  The history is provided by the patient. No language interpreter was used.  Weakness Severity:  Severe Onset quality:  Gradual Duration:  1 week Timing:  Constant Progression:  Worsening Chronicity:  New Relieved by:  Nothing Worsened by:  Nothing Ineffective treatments:  None tried Associated symptoms: difficulty walking   Risk factors: no neurologic disease   Pt discharged from the hospital on 5/3 after having a Gi evaluation and blood transfusion.  Pt reports falling at home and weakness to both lower legs.  Pt reports she is weak in both legs.       Past Medical History:  Diagnosis Date  . Asthma   . Chronic low back pain   . COPD (chronic obstructive pulmonary disease) ([place])   . Depression   . GERD (gastroesophageal reflux disease)   . Heart murmur   . Lobular carcinoma of left breast ([place]) 1997   in situ  . Other and unspecified hyperlipidemia   . Seasonal allergies     Patient Active Problem List   Diagnosis Date Noted  . Acute blood loss anemia 02/04/2021  . Anemia 02/03/2021  . COPD (chronic obstructive pulmonary disease) ([place]) 02/03/2021  . Depression 02/03/2021  . Intermediate stage nonexudative age-related macular degeneration of left eye 12/18/2020  . Microcytic anemia 11/05/2020  . Melena 11/05/2020  . Early stage nonexudative age-related macular degeneration of left eye 07/21/2020  . Degenerative retinal drusen of right eye 02/14/2020  . Early stage nonexudative age-related macular degeneration of right eye 02/14/2020  . Exudative age-related macular degeneration of right eye with active choroidal neovascularization ([place]) 02/14/2020  . History of vitrectomy 02/14/2020  . Chest pain 01/15/2014  . Mitral regurgitation 01/15/2014  .  Hyperlipidemia 01/15/2014    Past Surgical History:  Procedure Laterality Date  . BIOPSY  12/03/2020   Procedure: BIOPSY;  Surgeon: Rogene Houston, MD;  Location: [facility] ENDO SUITE;  Service: Endoscopy;;  gastric polyps biopsies;  . BREAST SURGERY Left    lumpectomy   . CESAREAN SECTION  1986  . CHOLECYSTECTOMY    . COLONOSCOPY WITH PROPOFOL N/A 12/03/2020    two 4-7 mm polyps in cecum, one small polyp in cecum, one 5 mm polyp, sigmoid diverticulosis, path with tubular adenomas  . ESOPHAGOGASTRODUODENOSCOPY (EGD) WITH PROPOFOL N/A 12/03/2020   normal esophagus, multiple gastric polyps s/p biopsy, small paraesophageal hernia, normal duodenum. Fundic gland polyps.  . ESOPHAGOGASTRODUODENOSCOPY (EGD) WITH PROPOFOL N/A 02/05/2021   Procedure: ESOPHAGOGASTRODUODENOSCOPY (EGD) WITH PROPOFOL;  Surgeon: [name], MD;  Location: [facility] ENDO SUITE;  Service: Endoscopy;  Laterality: N/A;  . GIVENS CAPSULE STUDY N/A 12/16/2020   normal small bowel capsule but rapid transit time of 21 minutes. Recommended to resume alendronate.   Marland Kitchen GIVENS CAPSULE STUDY N/A 02/04/2021   Procedure: GIVENS CAPSULE STUDY;  Surgeon: Rogene Houston, MD;  Location: [facility];  Service: Endoscopy;  Laterality: N/A;  . HOT HEMOSTASIS  02/05/2021   Procedure: HOT HEMOSTASIS (ARGON PLASMA COAGULATION/BICAP);  Surgeon: [name], MD;  Location: [facility] END[facility];  Service: Endoscopy;;  . TONSILLECTOMY     age 14     OB History   No obstetric history on file.     Family History  Problem Relation Age of Onset  .  Osteoporosis Mother   . Other Father        head trauma  . Breast cancer Sister        X's 2 sisters    Social History   Tobacco Use  . Smoking status: Former Smoker    Packs/day: 0.50    Years: 20.00    Pack years: 10.00    Types: Cigarettes    Start date: 09/16/1964    Quit date: 10/05/1983    Years since quitting: 37.3  . Smokeless tobacco: Never Used  Vaping Use  . Vaping Use: Never used  Substance  Use Topics  . Alcohol use: Never    Alcohol/week: 0.0 standard drinks  . Drug use: Never    Home Medications Prior to Admission medications   Medication Sig Start Date End Date Taking? Authorizing Provider  acetaminophen (TYLENOL) 500 MG tablet Take 500 mg by mouth every 6 (six) hours as needed for moderate pain or headache.    [provider]  ALPRAZolam Duanne Moron) 0.5 MG tablet Take 0.25 mg by mouth daily as needed for anxiety.    [provider]  fentaNYL (DURAGESIC - DOSED MCG/HR) 25 MCG/HR patch Place 25 mcg onto the skin See admin instructions. Place 25 mcg onto the skin for 72 hours then remove as needed for pain    [provider]  ferrous sulfate 325 (65 FE) MG tablet Take 1 tablet (325 mg total) by mouth daily with breakfast. 02/08/21   [name], [name], MD  HYDROcodone-acetaminophen (NORCO/VICODIN) 5-325 MG per tablet Take 1 tablet by mouth 2 (two) times daily as needed for moderate pain.     [provider]  montelukast (SINGULAIR) 10 MG tablet Take 10 mg by mouth at bedtime.    [provider]  Multiple Vitamins-Minerals (PRESERVISION AREDS 2) CAPS Take 1 capsule by mouth in the morning and at bedtime.    [provider]  pantoprazole (PROTONIX) 40 MG tablet Take 1 tablet (40 mg total) by mouth 2 (two) times daily. 02/07/21 05/08/21  Heath Lark D, DO  Pediatric Multivitamins-Iron [facility] COMPLETE) 18 MG CHEW Chew 1 tablet by mouth 2 (two) times daily. 01/27/21   Rogene Houston, MD  sertraline (ZOLOFT) 50 MG tablet Take 25 mg by mouth daily. 01/26/16   [provider]  simvastatin (ZOCOR) 20 MG tablet Take 20 mg by mouth daily.    [provider]  tiZANidine (ZANAFLEX) 4 MG tablet Take 2 mg by mouth at bedtime as needed for muscle spasms.    [provider]  Donnal Debar 200-62.5-25 MCG/INH AEPB Inhale 1 puff into the lungs daily. 12/21/20   [provider]    Allergies    Aspirin,  Codeine, Sulfa antibiotics, and Other  Review of Systems   Review of Systems  Neurological: Positive for weakness.  All other systems reviewed and are negative.   Physical Exam Updated Vital Signs BP (!) 154/62   Pulse 78   Temp 98 F (36.7 C) (Oral)   Resp 20   Ht 4\' 10"  (1.473 m)   Wt 62.6 kg   SpO2 98%   BMI 28.84 kg/m   Physical Exam Vitals and nursing note reviewed.  Constitutional:      Appearance: She is well-developed.  HENT:     Head: Normocephalic.  Cardiovascular:     Rate and Rhythm: Normal rate.  Pulmonary:     Effort: Pulmonary effort is normal.  Abdominal:     General: There is no  distension.  Musculoskeletal:        General: Normal range of motion.     Cervical back: Normal range of motion.  Skin:    General: Skin is warm.     Capillary Refill: Capillary refill takes less than 2 seconds.  Neurological:     Mental Status: She is alert and oriented to person, place, and time.     Comments: Pt unable to weight bear, Pt weak in lower extremities bilat.  Pt has good pusles  Psychiatric:        Mood and Affect: Mood normal.     ED Results / Procedures / Treatments   Labs (all labs ordered are listed, but only abnormal results are displayed) Labs Reviewed  CBC WITH DIFFERENTIAL/PLATELET - Abnormal; Notable for the following components:      Result Value   WBC 11.8 (*)    Hemoglobin 11.9 (*)    MCHC 29.8 (*)    Platelets 406 (*)    Monocytes Absolute 1.3 (*)    Abs Immature Granulocytes 0.08 (*)    All other components within normal limits  BASIC METABOLIC PANEL - Abnormal; Notable for the following components:   Glucose, Bld 117 (*)    All other components within normal limits  URINALYSIS, ROUTINE W REFLEX MICROSCOPIC - Abnormal; Notable for the following components:   Color, Urine STRAW (*)    All other components within normal limits  TROPONIN I (HIGH SENSITIVITY)  TROPONIN I (HIGH SENSITIVITY)    EKG EKG  Interpretation  Date/Time:  Monday Feb 16 2021 16:09:12 EDT Ventricular Rate:  87 PR Interval:  158 QRS Duration: 116 QT Interval:  410 QTC Calculation: 493 R Axis:   -40 Text Interpretation: Normal sinus rhythm Left axis deviation Anteroseptal infarct , age undetermined ST & T wave abnormality, consider lateral ischemia Abnormal ECG since last tracing no significant change Confirmed by Daleen Bo [phone]) on 02/16/2021 5:07:08 PM   Radiology CT Head Wo Contrast  Result Date: 02/16/2021 CLINICAL DATA:  Rule out stroke.  Bilateral leg weakness for 2 weeks EXAM: CT HEAD WITHOUT CONTRAST TECHNIQUE: Contiguous axial images were obtained from the base of the skull through the vertex without intravenous contrast. COMPARISON:  None. FINDINGS: Brain: No evidence of acute infarction, hemorrhage, hydrocephalus, extra-axial collection or mass lesion/mass effect. Vascular: No hyperdense vessel or unexpected calcification. Skull: Normal. Negative for fracture or focal lesion. Sinuses/Orbits: Status post bilateral maxillary sinus median antrectomies. Moderate diffuse mucosal thickening noted involving the maxillary sinuses, sphenoid sinuses and ethmoid air cells. Mastoid air cells appear clear. Other: None. IMPRESSION: 1. No acute intracranial abnormalities. 2. Chronic sinus inflammation. Electronically Signed   By: Kerby Moors M.D.   On: 02/16/2021 16:44    Procedures Procedures   Medications Ordered in ED Medications - No data to display  ED Course  I have reviewed the triage vital signs and the nursing notes.  Pertinent labs & imaging results that were available during my care of the patient were reviewed by me and considered in my medical decision making (see chart for details).    MDM Rules/Calculators/A&P                          Ct head no acute  Labs reviewed,   Dr. Eulis Foster in to see and evaluate.   Final Clinical Impression(s) / ED Diagnoses Final diagnoses:  Weakness of both  lower extremities    Rx / DC Orders ED Discharge  Orders    None    Pt admitted to hospitalist/neuro consult pending.    Fransico Meadow, PA-C 02/21/21 1009    Daleen Bo, MD 02/22/21 [phone]

## 2021-02-16 NOTE — ED Provider Notes (Signed)
  Face-to-face evaluation   History: She complains of progressive weakness of her legs which patient difficulty walking, for about 10 days since discharge from hospital after treatment for anemia.  No known trauma.  Her weakness is progressively worse during the day, then gets a little better when she gets up in the morning.  She denies change in chronic back pain.  She denies headache.  Physical exam: Elderly female alert and cooperative patient sitting on the edge of the bed, apparently comfortable.  With attempt to stand she requires 2 person assist and takes a stuttering steps, just a couple.  She then returns to the stretcher and sits down.  Getting strength is diminished on both  upper and lower leg strength, equal symmetric, bilaterally.   Medical screening examination/treatment/procedure(s) were conducted as a shared visit with non-physician practitioner(s) and myself.  I personally evaluated the patient during the encounter    Daleen Bo, MD 02/17/21 0003

## 2021-02-16 NOTE — ED Notes (Signed)
Assisted to bathroom for urine specimen, took 2 people to get her to bathroom, has almost no use of legs

## 2021-02-16 NOTE — ED Notes (Signed)
Secretary to page hospitalist re: bed request

## 2021-02-16 NOTE — ED Provider Notes (Signed)
Emergency Medicine Provider Triage Evaluation Note  Alexa Nichols , a 75 y.o. female  was evaluated in triage.  Pt complains of weakness  Review of Systems  Positive: anemia Negative: bleeding  Physical Exam  BP 126/86 (BP Location: Right Arm)   Pulse 84   Temp 98.9 F (37.2 C) (Oral)   Resp 16   Ht 4\' 10"  (1.473 m)   Wt 62.6 kg   SpO2 97%   BMI 28.84 kg/m  Gen:   Awake, no distress   Resp:  Normal effort  MSK:   Moves extremities without difficulty  Other:    Medical Decision Making  Medically screening exam initiated at 2:21 PM.  Appropriate orders placed.  Alexa Nichols was informed that the remainder of the evaluation will be completed by another provider, this initial triage assessment does not replace that evaluation, and the importance of remaining in the ED until their evaluation is complete.  Labs ordered   Alexa Nichols 02/16/21 1422    Luna Fuse, MD 02/16/21 [phone]

## 2021-02-17 ENCOUNTER — Observation Stay (HOSPITAL_COMMUNITY): Payer: Medicare Other

## 2021-02-17 DIAGNOSIS — M4724 Other spondylosis with radiculopathy, thoracic region: Secondary | ICD-10-CM | POA: Diagnosis not present

## 2021-02-17 DIAGNOSIS — R200 Anesthesia of skin: Secondary | ICD-10-CM

## 2021-02-17 DIAGNOSIS — M5115 Intervertebral disc disorders with radiculopathy, thoracolumbar region: Secondary | ICD-10-CM | POA: Diagnosis not present

## 2021-02-17 DIAGNOSIS — M5013 Cervical disc disorder with radiculopathy, cervicothoracic region: Secondary | ICD-10-CM | POA: Diagnosis not present

## 2021-02-17 DIAGNOSIS — R262 Difficulty in walking, not elsewhere classified: Secondary | ICD-10-CM | POA: Diagnosis not present

## 2021-02-17 DIAGNOSIS — M50123 Cervical disc disorder at C6-C7 level with radiculopathy: Secondary | ICD-10-CM | POA: Diagnosis not present

## 2021-02-17 DIAGNOSIS — R29898 Other symptoms and signs involving the musculoskeletal system: Secondary | ICD-10-CM

## 2021-02-17 DIAGNOSIS — R2689 Other abnormalities of gait and mobility: Secondary | ICD-10-CM | POA: Diagnosis not present

## 2021-02-17 DIAGNOSIS — M5117 Intervertebral disc disorders with radiculopathy, lumbosacral region: Secondary | ICD-10-CM | POA: Diagnosis not present

## 2021-02-17 DIAGNOSIS — E785 Hyperlipidemia, unspecified: Secondary | ICD-10-CM

## 2021-02-17 DIAGNOSIS — M4726 Other spondylosis with radiculopathy, lumbar region: Secondary | ICD-10-CM | POA: Diagnosis not present

## 2021-02-17 DIAGNOSIS — M5114 Intervertebral disc disorders with radiculopathy, thoracic region: Secondary | ICD-10-CM | POA: Diagnosis not present

## 2021-02-17 DIAGNOSIS — M4727 Other spondylosis with radiculopathy, lumbosacral region: Secondary | ICD-10-CM | POA: Diagnosis not present

## 2021-02-17 DIAGNOSIS — R202 Paresthesia of skin: Secondary | ICD-10-CM

## 2021-02-17 LAB — COMPREHENSIVE METABOLIC PANEL
ALT: 18 U/L (ref 0–44)
AST: 22 U/L (ref 15–41)
Albumin: 3.9 g/dL (ref 3.5–5.0)
Alkaline Phosphatase: 88 U/L (ref 38–126)
Anion gap: 10 (ref 5–15)
BUN: 14 mg/dL (ref 8–23)
CO2: 27 mmol/L (ref 22–32)
Calcium: 9 mg/dL (ref 8.9–10.3)
Chloride: 102 mmol/L (ref 98–111)
Creatinine, Ser: 0.46 mg/dL (ref 0.44–1.00)
GFR, Estimated: >60 mL/min (ref >60)
Glucose, Bld: 104 mg/dL — ABNORMAL HIGH (ref 70–99)
Potassium: 4.3 mmol/L (ref 3.5–5.1)
Sodium: 139 mmol/L (ref 135–145)
Total Bilirubin: 0.4 mg/dL (ref 0.3–1.2)
Total Protein: 7.2 g/dL (ref 6.5–8.1)

## 2021-02-17 LAB — LIPID PANEL
Cholesterol: 161 mg/dL (ref 0–200)
HDL: 36 mg/dL — ABNORMAL LOW (ref >40)
LDL Cholesterol: 68 mg/dL (ref 0–99)
Total CHOL/HDL Ratio: 4.5 RATIO
Triglycerides: 285 mg/dL — ABNORMAL HIGH (ref <150)
VLDL: 57 mg/dL — ABNORMAL HIGH (ref 0–40)

## 2021-02-17 LAB — CBC
HCT: 36.2 % (ref 36.0–46.0)
Hemoglobin: 11 g/dL — ABNORMAL LOW (ref 12.0–15.0)
MCH: 27.6 pg (ref 26.0–34.0)
MCHC: 30.4 g/dL (ref 30.0–36.0)
MCV: 90.7 fL (ref 80.0–100.0)
Platelets: 342 10*3/uL (ref 150–400)
RBC: 3.99 MIL/uL (ref 3.87–5.11)
RDW: 15.1 % (ref 11.5–15.5)
WBC: 8.8 10*3/uL (ref 4.0–10.5)
nRBC: 0 % (ref 0.0–0.2)

## 2021-02-17 LAB — HEMOGLOBIN A1C
Hgb A1c MFr Bld: 4.6 % — ABNORMAL LOW (ref 4.8–5.6)
Mean Plasma Glucose: 85.32 mg/dL

## 2021-02-17 LAB — VITAMIN D 25 HYDROXY (VIT D DEFICIENCY, FRACTURES): Vit D, 25-Hydroxy: 31.39 ng/mL (ref 30–100)

## 2021-02-17 LAB — VITAMIN B12: Vitamin B-12: 205 pg/mL (ref 180–914)

## 2021-02-17 IMAGING — MR MR HEAD W/O CM
11 of 12 series · 40 of 48 positions shown · non-contrast
Comparison: CT head [DATE].

CLINICAL DATA: Neuro deficit, acute stroke suspected. Difficulty
walking.

EXAM:
MRI HEAD WITHOUT CONTRAST
TECHNIQUE: Multiplanar, multiecho pulse sequences of the brain and surrounding
structures were obtained without intravenous contrast.

[Series 5: DWI · axial · 4.0mm · 0.88mm/px · z∈[-42,+84]mm · 4 of 36 slices shown (1 of 6)]
[im 1/36]
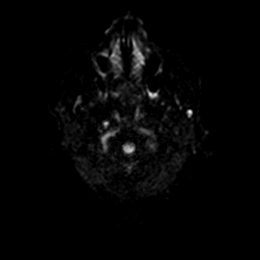
[im 12/36]
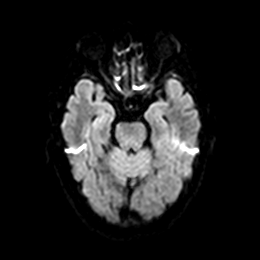
[im 24/36]
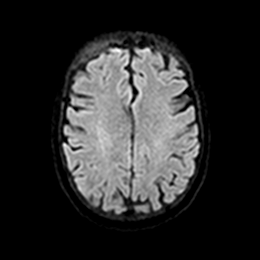
[im 36/36]
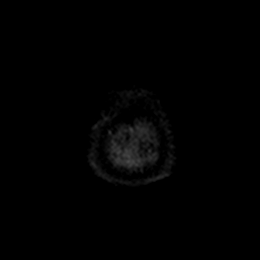

[Series 5: DWI · axial · 4.0mm · 0.88mm/px · z∈[-42,+84]mm · 4 of 36 slices shown (2 of 6)]
[im 1/36]
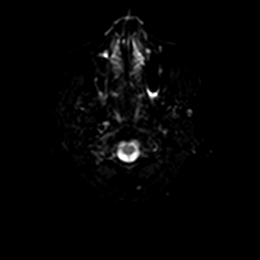
[im 12/36]
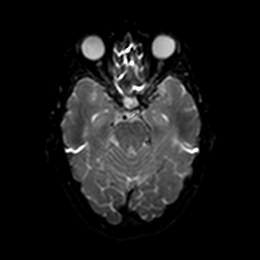
[im 24/36]
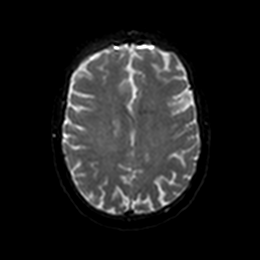
[im 36/36]
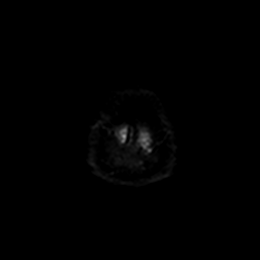

[Series 6: DWI · axial · 4.0mm · 0.88mm/px · z∈[-42,+84]mm · 4 of 36 slices shown (3 of 6)]
[im 1/36]
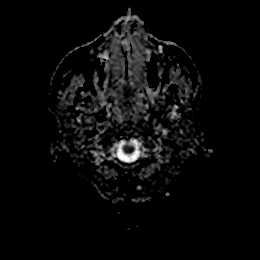
[im 12/36]
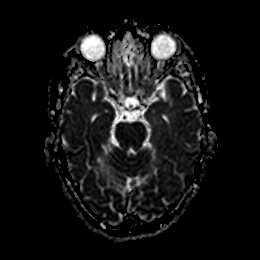
[im 24/36]
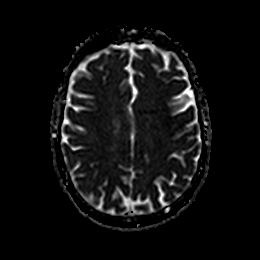
[im 36/36]
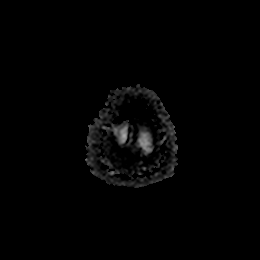

[Series 7: DWI · coronal · 5.0mm · 0.88mm/px · 3 of 28 slices shown (4 of 6)]
[im 1/28]
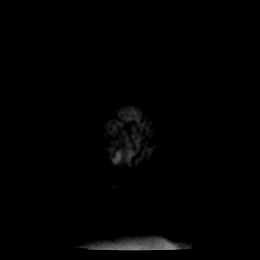
[im 14/28]
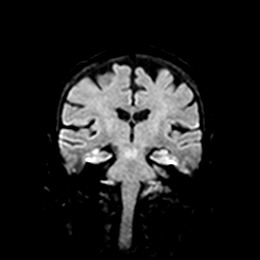
[im 28/28]
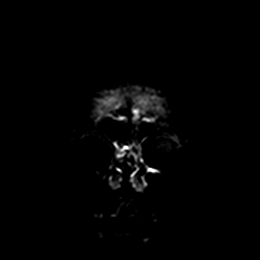

[Series 7: DWI · coronal · 5.0mm · 0.88mm/px · 4 of 28 slices shown (5 of 6)]
[im 1/28]
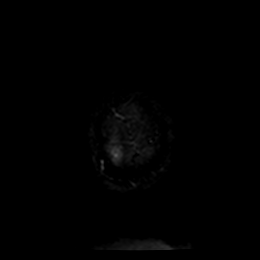
[im 10/28]
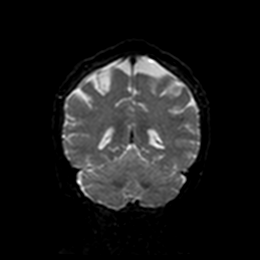
[im 19/28]
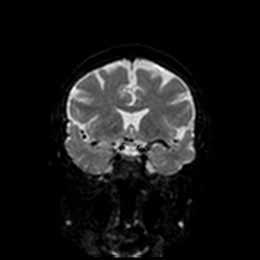
[im 28/28]
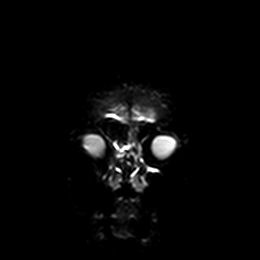

[Series 8: DWI · coronal · 5.0mm · 0.88mm/px · 4 of 28 slices shown (6 of 6)]
[im 1/28]
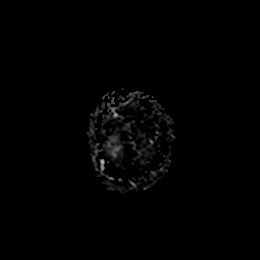
[im 10/28]
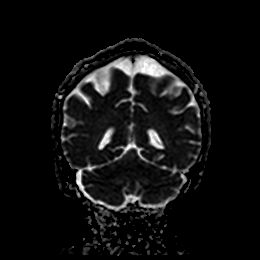
[im 19/28]
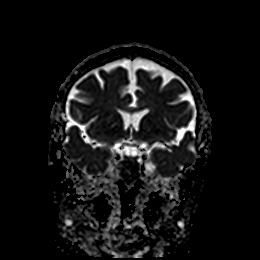
[im 28/28]
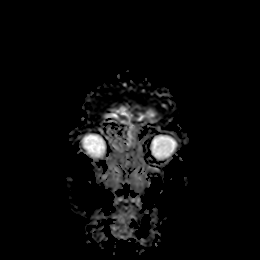

[Series 9: T1 · sagittal · 5.0mm · 0.94mm/px · 3 of 25 slices shown]
[im 1/25]
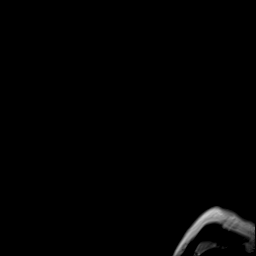
[im 13/25]
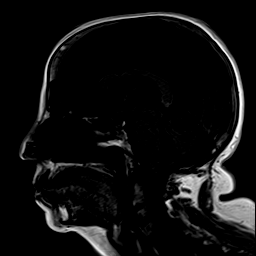
[im 25/25]
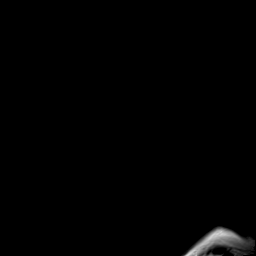

[Series 10: T2 · axial · 5.0mm · 0.72mm/px · z∈[-39,+81]mm · 3 of 20 slices shown (1 of 2)]
[im 1/20]
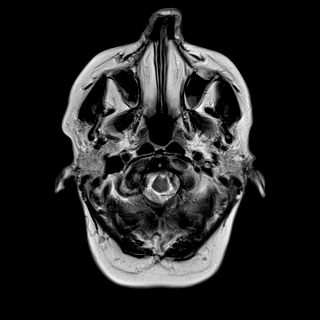
[im 10/20]
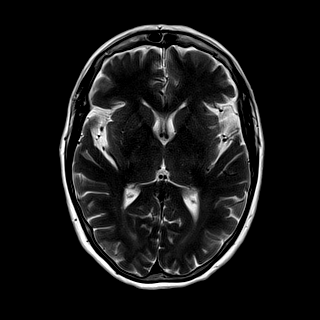
[im 20/20]
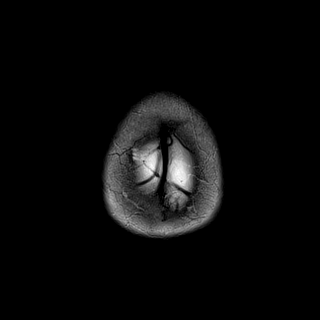

[Series 11: ax hemo · axial · 5.0mm · 0.86mm/px · z∈[-45,+84]mm · 3 of 25 slices shown]
[im 1/25]
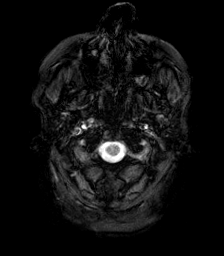
[im 13/25]
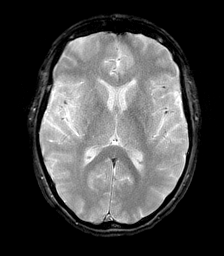
[im 25/25]
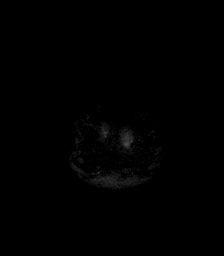

[Series 12: FLAIR · axial · 4.0mm · 0.43mm/px · z∈[-36,+75]mm · 4 of 32 slices shown]
[im 1/32]
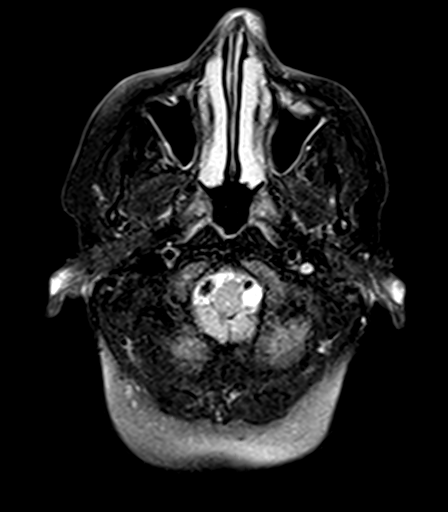
[im 11/32]
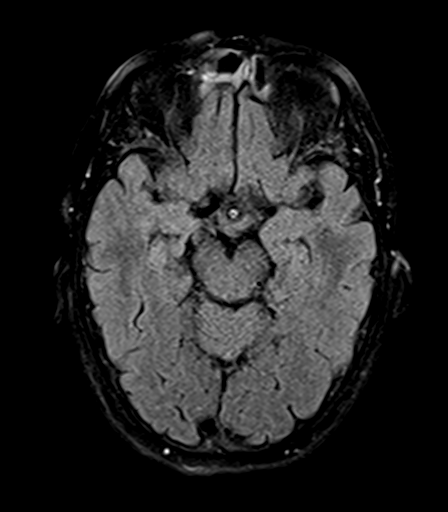
[im 21/32]
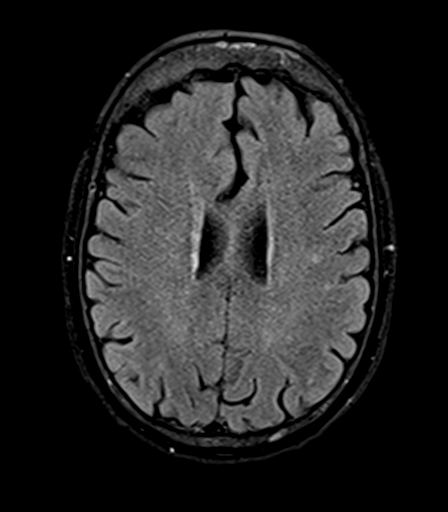
[im 32/32]
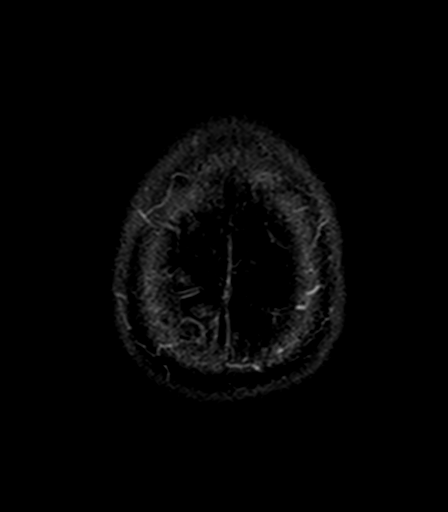

[Series 14: T2 · coronal · 5.0mm · 0.72mm/px · 4 of 28 slices shown (2 of 2)]
[im 1/28]
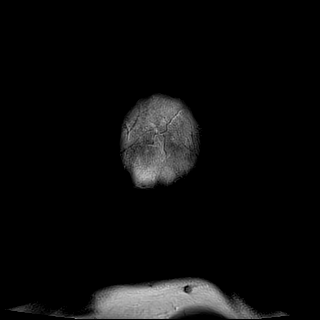
[im 10/28]
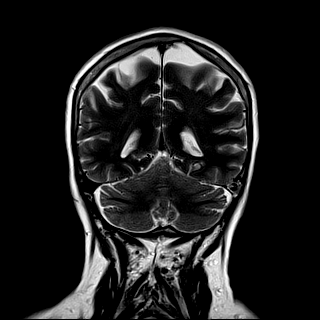
[im 19/28]
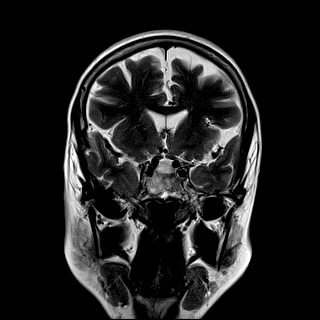
[im 28/28]
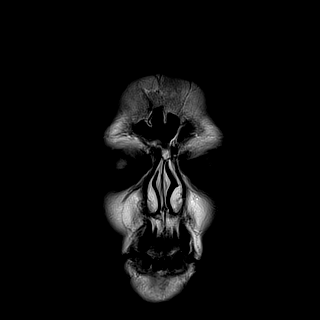

[40 of 48 positions shown; findings below may reference images not displayed]

FINDINGS: Brain: No acute infarction, hemorrhage, hydrocephalus, or
extra-axial fluid collection. Mild for age T2/FLAIR hyperintensities
within the white matter, most likely related to chronic
microvascular ischemic disease. Incidental 11 mm calcified
extra-axial, dural-based lesion along the posterior right vertex
(series 11, image 23), likely a meningioma. No mass effect or
surrounding edema.

Vascular: Major arterial flow voids are maintained at the skull
base.

Skull and upper cervical spine: Normal marrow signal.

Sinuses/Orbits: Evidence of prior endoscopic sinus surgery with
bilateral maxillary antrostomies and ethmoidectomies. Moderate
pansinus mucosal thickening, greatest in the ethmoid air cells and
sphenoid sinuses. Air-fluid level in the right sphenoid sinus.

Other: Small right mastoid effusion.
IMPRESSION: 1. No evidence of acute intracranial abnormality. Specifically, no
acute infarct.
2. Incidental presumed 11 mm meningioma along the vertex without
mass effect or surrounding edema.
3. Evidence of prior endoscopic sinus surgery with moderate
paranasal sinus mucosal thickening, as detailed above.

## 2021-02-17 IMAGING — MR MR THORACIC SPINE W/O CM
4 of 6 series · 23 of 48 positions shown · non-contrast
Comparison: None.

CLINICAL DATA: Lumbar radiculopathy.

EXAM:
MRI THORACIC SPINE WITHOUT CONTRAST
TECHNIQUE: Multiplanar, multisequence MR imaging of the thoracic spine was
performed. No intravenous contrast was administered.

[Series 100: T1 · sagittal · 3.3mm · 0.62mm/px · 5 of 14 slices shown (1 of 2)]
[im 1/14]
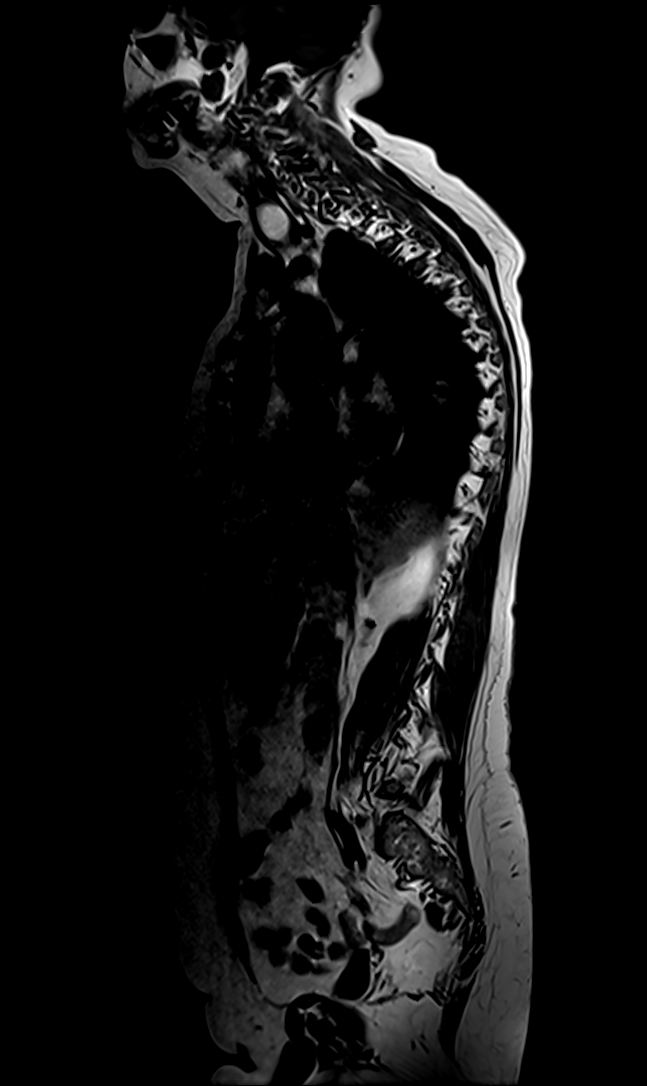
[im 4/14]
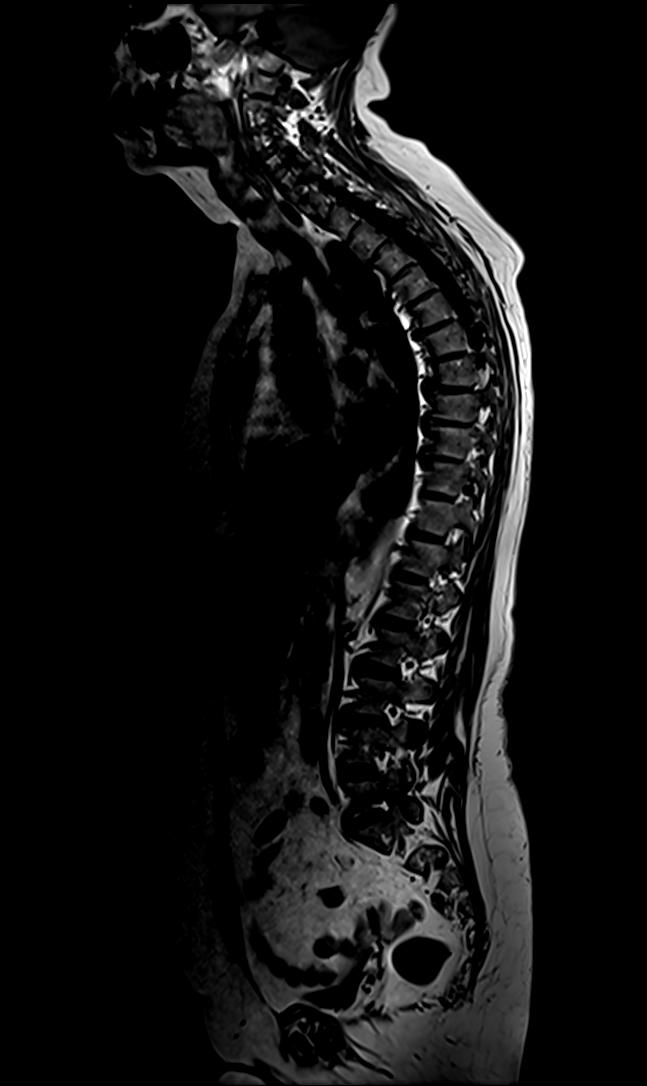
[im 7/14]
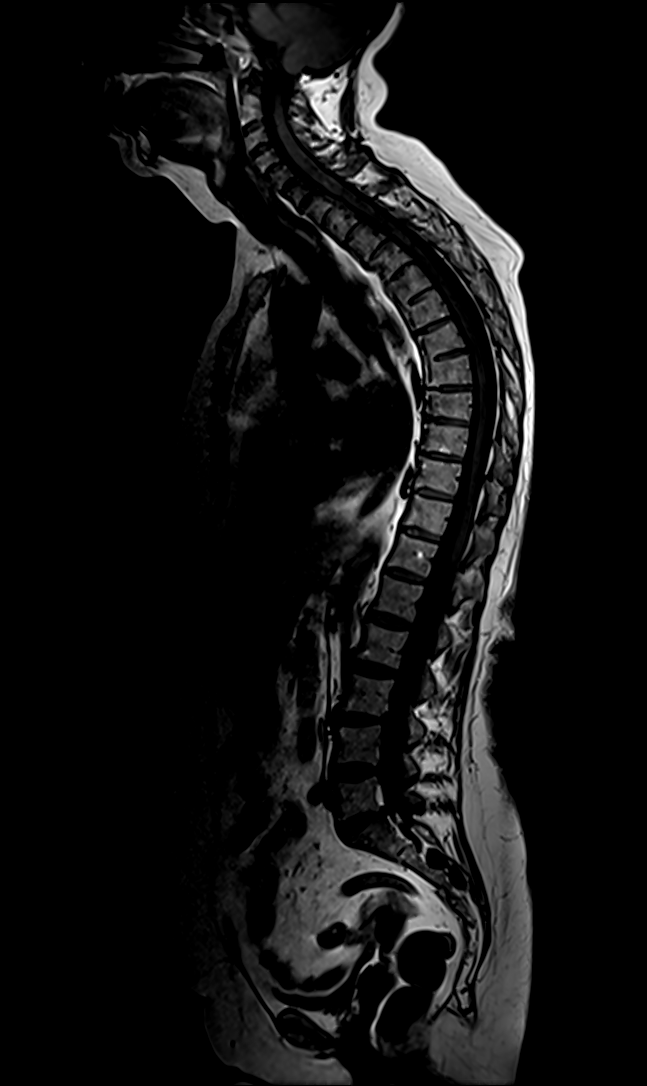
[im 10/14]
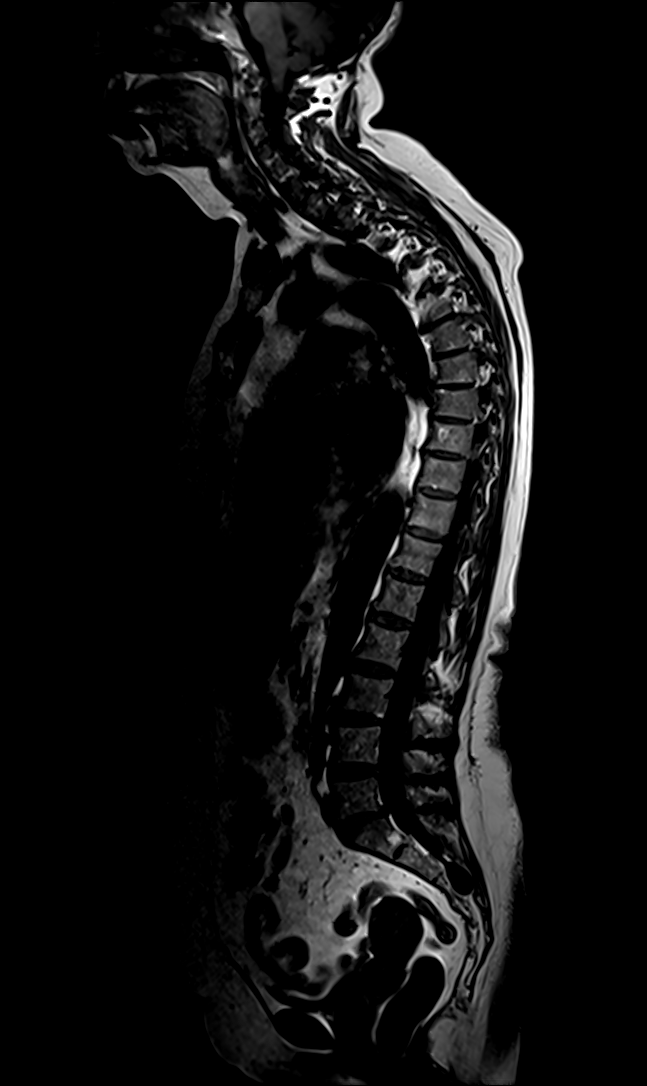
[im 14/14]
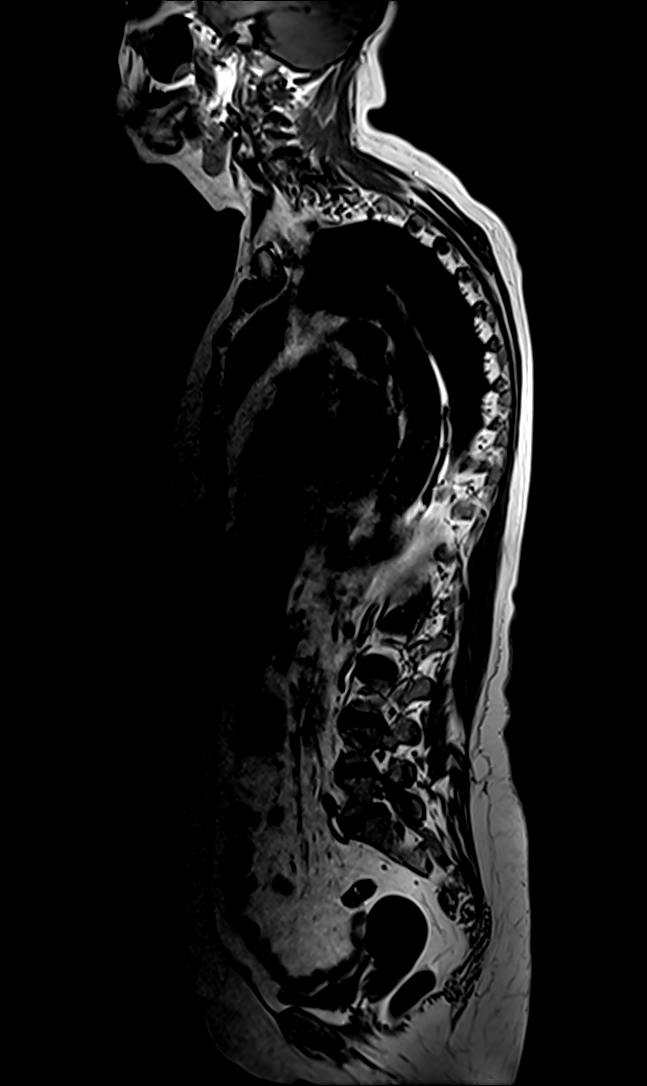

[Series 101: T2 · sagittal · 3.0mm · 0.94mm/px · 5 of 15 slices shown (1 of 2)]
[im 1/15]
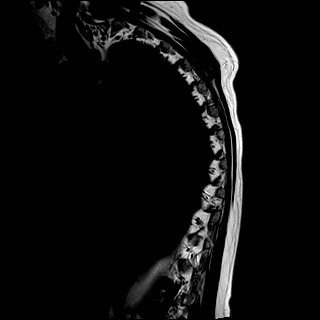
[im 4/15]
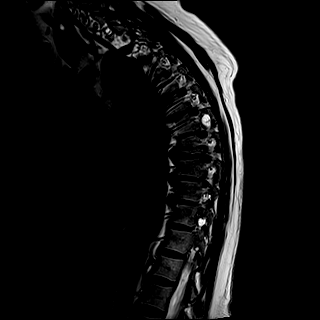
[im 8/15]
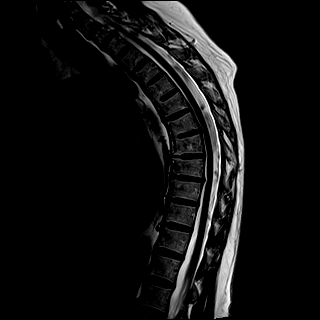
[im 11/15]
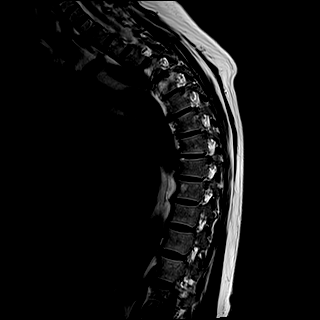
[im 15/15]
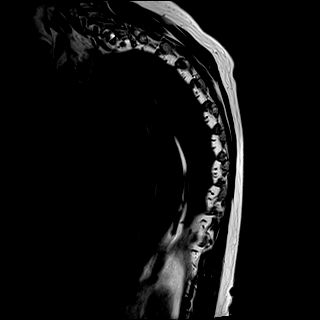

[Series 102: T1 · sagittal · 3.0mm · 1.17mm/px · 5 of 15 slices shown (2 of 2)]
[im 1/15]
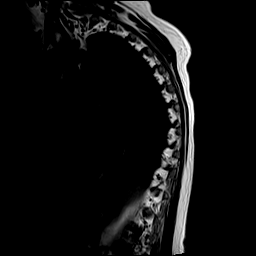
[im 4/15]
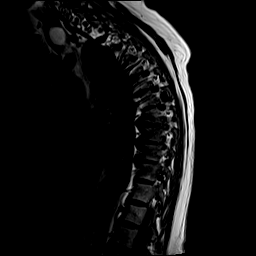
[im 8/15]
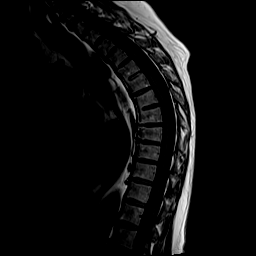
[im 11/15]
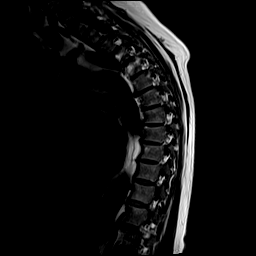
[im 15/15]
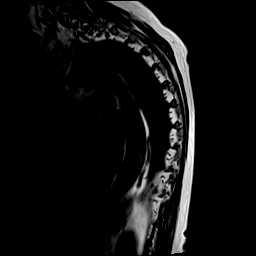

[Series 104: T2 · axial · 5.0mm · 0.74mm/px · z∈[-203,-35]mm · 8 of 39 slices shown (2 of 2)]
[im 1/39]
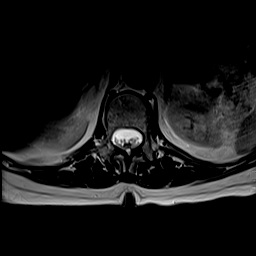
[im 6/39]
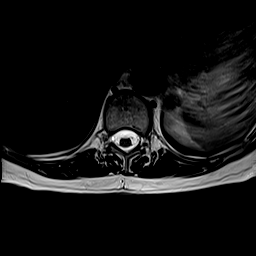
[im 12/39]
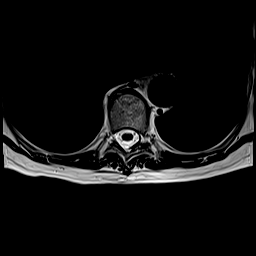
[im 18/39]
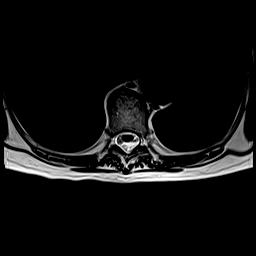
[im 21/39]
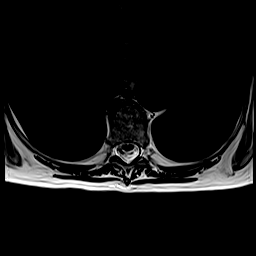
[im 27/39]
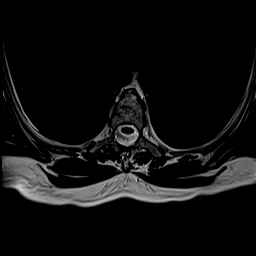
[im 33/39]
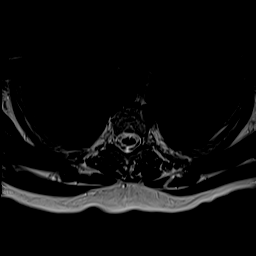
[im 39/39]
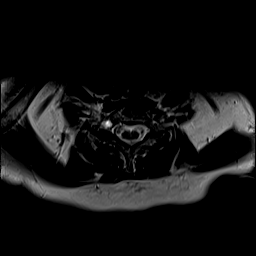

[23 of 48 positions shown; findings below may reference images not displayed]

FINDINGS: Alignment:  Exaggerated thoracic kyphosis.

Vertebrae: No fracture, evidence of discitis, or bone lesion.
Anterior vertebral body/disc space ankylosis from T3 through T5 and
at T6-7.

Cord: Normal cord signal. Small indentation on the anterior cord
surface at the C6-7 and T8-9

Paraspinal and other soft tissues: Negative.

Disc levels:

Small posterior disc protrude at C6-7, C7-T1 and T1-2 causing small
indentation on the thecal sac without significant spinal canal or
neural foraminal stenosis.

Small posterior disc protrusions at T6-7 and T8-9 causing small
indentations on the anterior cord surface without cord signal
abnormality, significant spinal canal stenosis or neural foraminal
stenosis.

No significant spinal canal or neural foraminal stenosis at the
remaining thoracic levels.

Right perineural cysts at T6-7 and T10-11
IMPRESSION: Exaggerated thoracic kyphosis and mild degenerative changes without
high-grade spinal canal or neural foraminal stenosis at any level.

## 2021-02-17 IMAGING — MR MR LUMBAR SPINE W/O CM
5 series · 43 of 48 positions shown · non-contrast
Comparison: CT abdomen/pelvis [DATE].

CLINICAL DATA: Lumbar radiculopathy, greater than 6 weeks.

EXAM:
MRI LUMBAR SPINE WITHOUT CONTRAST
TECHNIQUE: Multiplanar, multisequence MR imaging of the lumbar spine was
performed. No intravenous contrast was administered.

[Series 16: T2 · sagittal · 4.0mm · 0.81mm/px · 6 of 13 slices shown (1 of 2)]
[im 1/13]
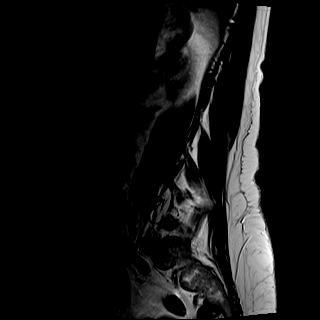
[im 3/13]
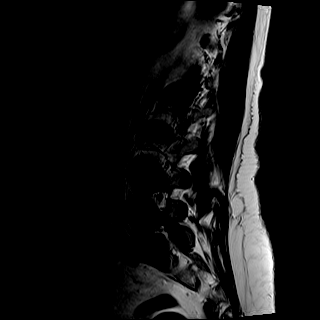
[im 5/13]
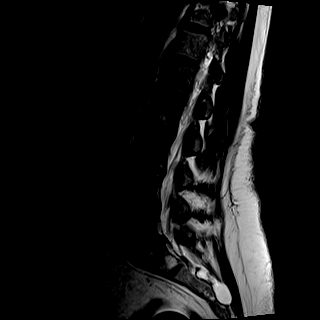
[im 8/13]
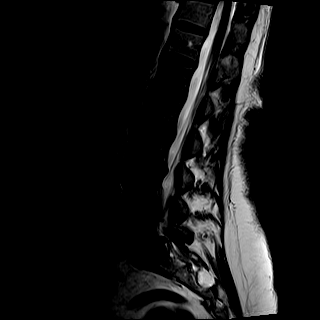
[im 10/13]
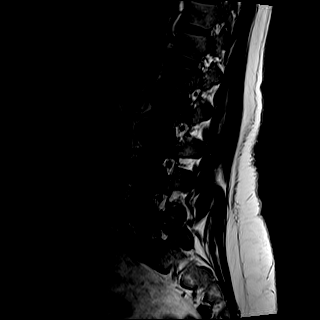
[im 13/13]
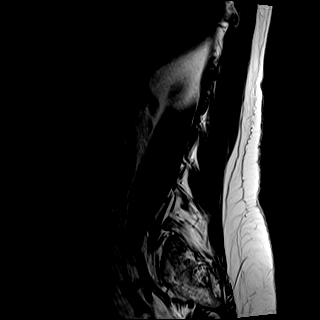

[Series 17: T1 · sagittal · 4.0mm · 1.02mm/px · 6 of 13 slices shown (1 of 2)]
[im 1/13]
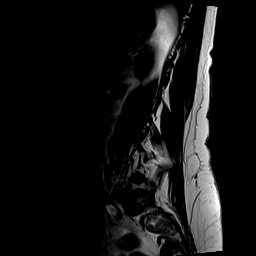
[im 3/13]
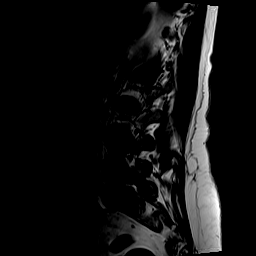
[im 5/13]
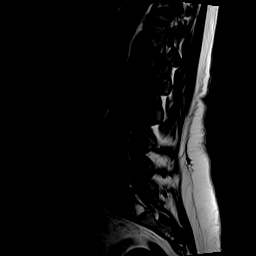
[im 8/13]
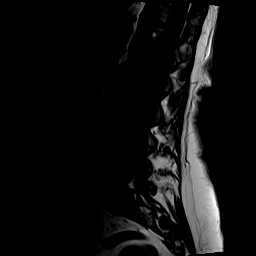
[im 10/13]
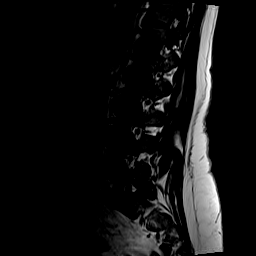
[im 13/13]
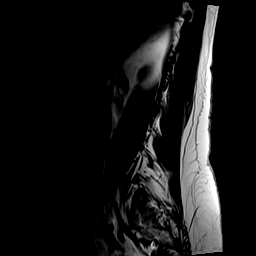

[Series 18: STIR · sagittal · 4.0mm · 0.51mm/px · 6 of 13 slices shown]
[im 1/13]
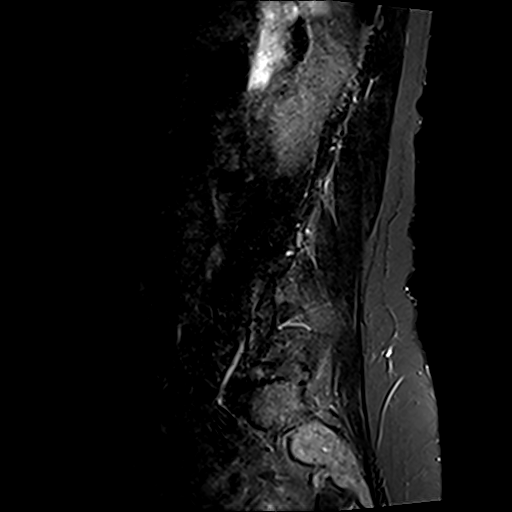
[im 3/13]
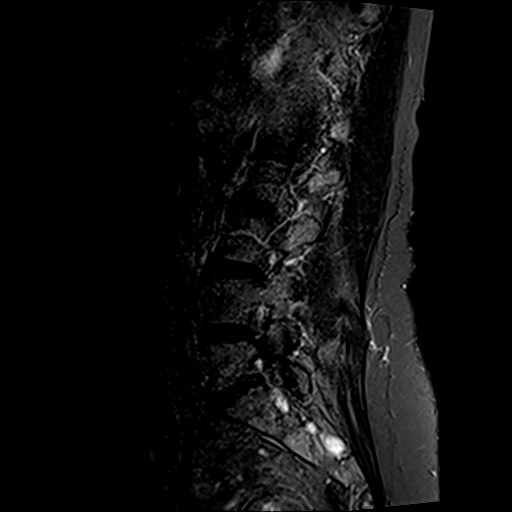
[im 5/13]
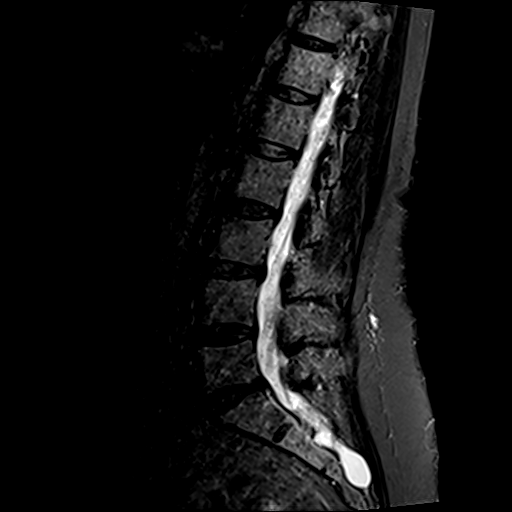
[im 8/13]
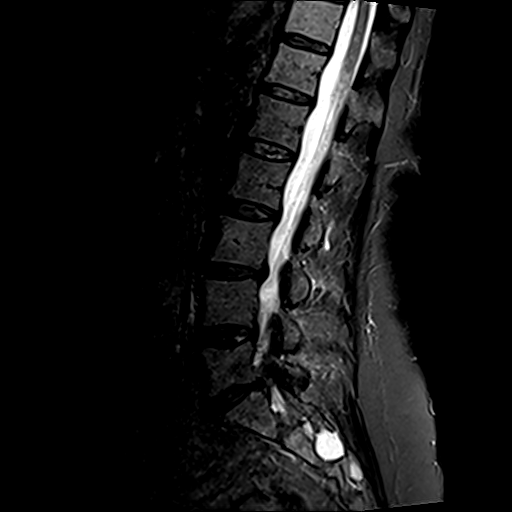
[im 10/13]
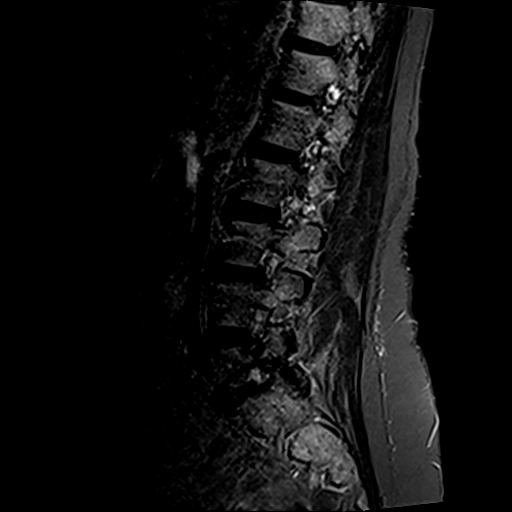
[im 13/13]
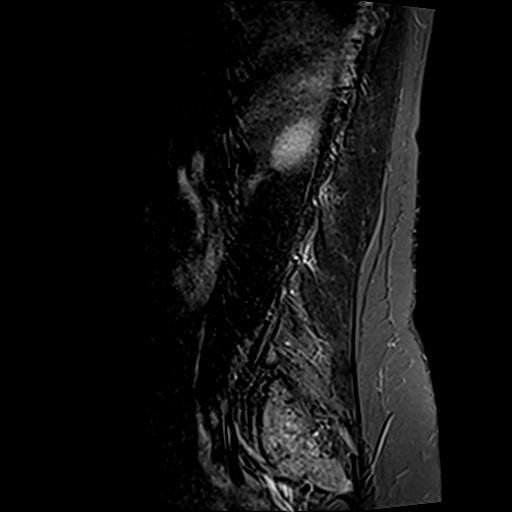

[Series 19: T2 · axial · 4.0mm · 0.70mm/px · z∈[-395,-216]mm · 15 of 30 slices shown (2 of 2)]
[im 1/30]
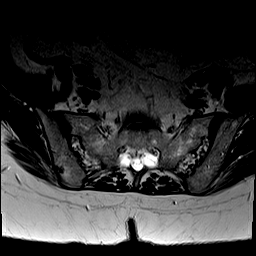
[im 3/30]
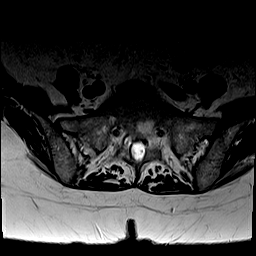
[im 5/30]
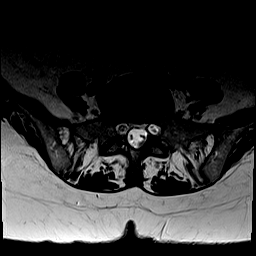
[im 7/30]
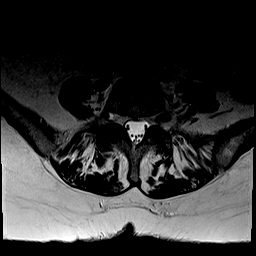
[im 9/30]
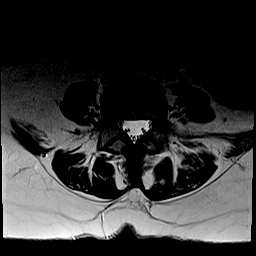
[im 11/30]
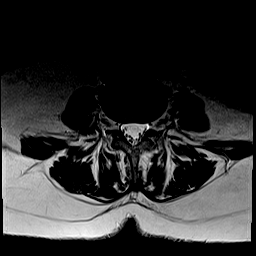
[im 13/30]
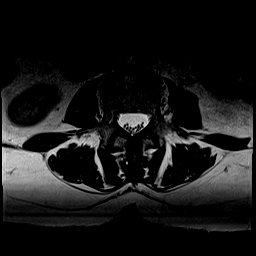
[im 15/30]
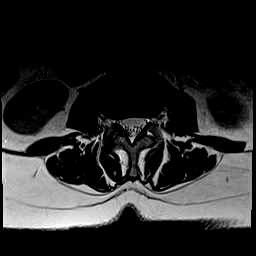
[im 17/30]
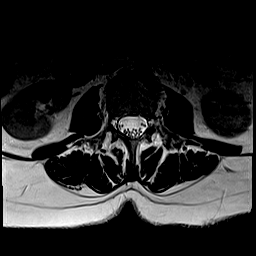
[im 19/30]
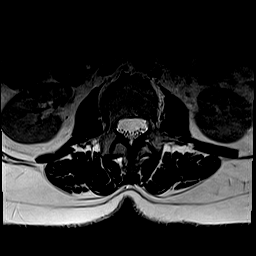
[im 21/30]
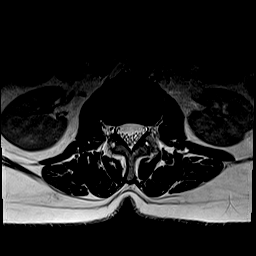
[im 23/30]
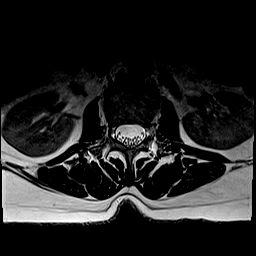
[im 25/30]
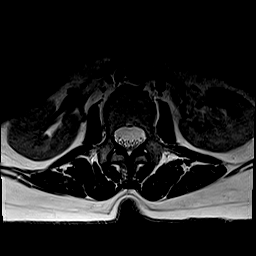
[im 27/30]
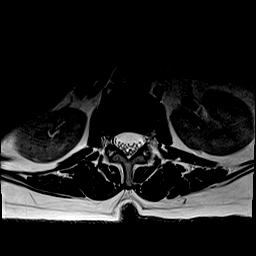
[im 30/30]
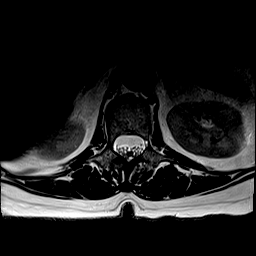

[Series 20: T1 · axial · 4.0mm · 0.70mm/px · z∈[-395,-216]mm · 10 of 30 slices shown (2 of 2)]
[im 1/30]
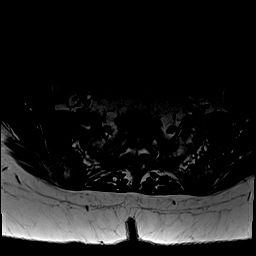
[im 3/30]
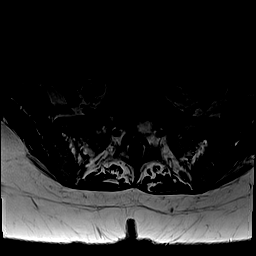
[im 5/30]
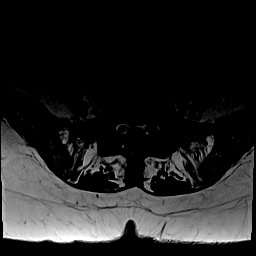
[im 9/30]
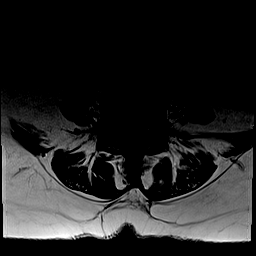
[im 13/30]
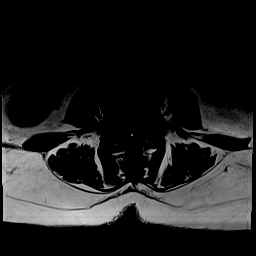
[im 15/30]
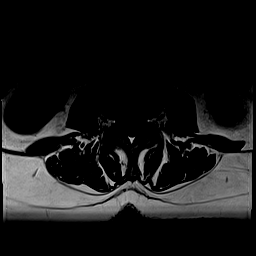
[im 17/30]
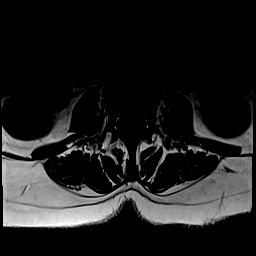
[im 21/30]
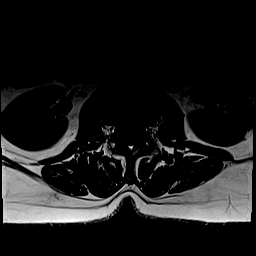
[im 25/30]
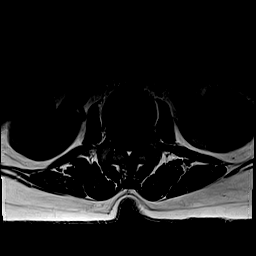
[im 30/30]
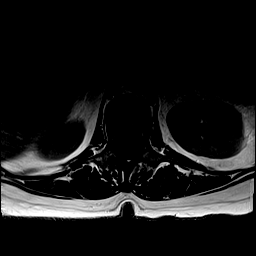

[43 of 48 positions shown; findings below may reference images not displayed]

FINDINGS: Segmentation: 5 lumbar vertebrae. The caudal most well-formed
intervertebral disc space is designated L5-S1

Alignment: Trace L1-L2 grade 1 retrolisthesis. Trace L3-L4 grade 1
retrolisthesis. Trace L5-S1 grade 1 anterolisthesis.

Vertebrae: Vertebral body height is maintained. No significant
marrow edema or focal suspicious osseous lesion. L2 vertebral body
hemangioma.

Conus medullaris and cauda equina: Conus extends to the L2 level. No
signal abnormality within the visualized distal spinal cord.

Paraspinal and other soft tissues: Tiny T2 hyperintense lesion
within the right kidney, too small to characterize but likely
reflecting a cyst. Paraspinal soft tissues within normal limits.

Disc levels:

Mild disc degeneration at the L2-L3 through L5-S1 levels, greatest
at L3-L4 and L5-S1.

T12-L1: Imaged sagittally. Shallow disc bulge. No significant spinal
canal or foraminal stenosis.

L1-L2: No significant disc herniation or stenosis.

L2-L3: Minimal disc bulge. No significant spinal canal or foraminal
stenosis.

L3-L4: Disc bulge. Superimposed shallow broad-based right
subarticular to right foraminal disc protrusion. Mild relative right
subarticular narrowing without frank nerve root impingement. Central
canal patent. Minimal relative right neural foraminal narrowing
without nerve root impingement.

L4-L5: Minimal disc bulge. Minimal facet arthrosis. Mild ligamentum
flavum hypertrophy on the right. No significant spinal canal or
foraminal stenosis.

L5-S1: Disc bulge. Mild endplate spurring. Facet
arthrosis/ligamentum flavum hypertrophy. Bilateral subarticular
narrowing (mild right, mild/moderate left) with some crowding of the
left greater than right descending S1 nerve roots (series 19, image
25). Central canal patent. Mild relative bilateral neural foraminal
narrowing.
IMPRESSION: Lumbar spondylosis, as outlined and with findings most notably as
follows.

At L5-S1, there is trace grade 1 anterolisthesis. Mild disc
degeneration. Disc bulge. Mild endplate spurring. Facet
arthrosis/ligamentum flavum hypertrophy. Bilateral subarticular
narrowing (mild right, mild/moderate left) with some crowding of the
left greater than right descending S1 nerve roots. Mild relative
bilateral neural foraminal narrowing.

At L3-L4, there is trace retrolisthesis. Mild disc degeneration.
Superimposed shallow broad-based right subarticular to right
foraminal disc protrusion. Mild right subarticular narrowing without
frank nerve root impingement. Minimal relative right neural
foraminal narrowing without nerve root impingement.

No significant spinal canal or neural foraminal stenosis at the
remaining levels.

## 2021-02-17 MED ORDER — UMECLIDINIUM BROMIDE 62.5 MCG/INH IN AEPB
1.0000 | INHALATION_SPRAY | Freq: Every day | RESPIRATORY_TRACT | Status: DC
  Administered 2021-02-17 – 2021-02-23 (×7): 1 via RESPIRATORY_TRACT
  Filled 2021-02-17: qty 7

## 2021-02-17 MED ORDER — SIMVASTATIN 20 MG PO TABS
20.0000 mg | ORAL_TABLET | Freq: Every day | ORAL | Status: DC
  Administered 2021-02-17: 20 mg via ORAL
  Filled 2021-02-17: qty 1

## 2021-02-17 MED ORDER — SERTRALINE HCL 50 MG PO TABS
25.0000 mg | ORAL_TABLET | Freq: Every day | ORAL | Status: DC
  Administered 2021-02-17 – 2021-02-23 (×7): 25 mg via ORAL
  Filled 2021-02-17 (×7): qty 1

## 2021-02-17 MED ORDER — ACETAMINOPHEN 325 MG PO TABS
650.0000 mg | ORAL_TABLET | ORAL | Status: DC | PRN
  Administered 2021-02-17 – 2021-02-23 (×6): 650 mg via ORAL
  Filled 2021-02-17 (×7): qty 2

## 2021-02-17 MED ORDER — HEPARIN SODIUM (PORCINE) 5000 UNIT/ML IJ SOLN
5000.0000 [IU] | Freq: Three times a day (TID) | INTRAMUSCULAR | Status: DC
  Administered 2021-02-17 (×2): 5000 [IU] via SUBCUTANEOUS
  Filled 2021-02-17 (×2): qty 1

## 2021-02-17 MED ORDER — FLUTICASONE FUROATE-VILANTEROL 100-25 MCG/INH IN AEPB
1.0000 | INHALATION_SPRAY | Freq: Every day | RESPIRATORY_TRACT | Status: DC
  Administered 2021-02-17 – 2021-02-23 (×7): 1 via RESPIRATORY_TRACT
  Filled 2021-02-17: qty 28

## 2021-02-17 MED ORDER — HYDROCODONE-ACETAMINOPHEN 5-325 MG PO TABS
1.0000 | ORAL_TABLET | Freq: Two times a day (BID) | ORAL | Status: DC | PRN
  Administered 2021-02-17 – 2021-02-18 (×2): 1 via ORAL
  Filled 2021-02-17 (×4): qty 1

## 2021-02-17 MED ORDER — ACETAMINOPHEN 160 MG/5ML PO SOLN: 650.0000 mg | ORAL | Status: DC | PRN

## 2021-02-17 MED ORDER — ACETAMINOPHEN 650 MG RE SUPP: 650.0000 mg | RECTAL | Status: DC | PRN

## 2021-02-17 MED ORDER — GABAPENTIN 100 MG PO CAPS
100.0000 mg | ORAL_CAPSULE | Freq: Three times a day (TID) | ORAL | Status: DC
  Administered 2021-02-17 – 2021-02-22 (×15): 100 mg via ORAL
  Filled 2021-02-17 (×15): qty 1

## 2021-02-17 MED ORDER — FLUTICASONE-UMECLIDIN-VILANT 200-62.5-25 MCG/INH IN AEPB: 1.0000 | INHALATION_SPRAY | Freq: Every day | RESPIRATORY_TRACT | Status: DC

## 2021-02-17 MED ORDER — EZETIMIBE 10 MG PO TABS
10.0000 mg | ORAL_TABLET | Freq: Every day | ORAL | Status: DC
  Administered 2021-02-17 – 2021-02-23 (×7): 10 mg via ORAL
  Filled 2021-02-17 (×7): qty 1

## 2021-02-17 MED ORDER — STROKE: EARLY STAGES OF RECOVERY BOOK: Freq: Once | Status: DC

## 2021-02-17 MED ORDER — IMMUNE GLOBULIN (HUMAN) 10 GM/100ML IV SOLN
400.0000 mg/kg | INTRAVENOUS | Status: AC
  Administered 2021-02-17 – 2021-02-21 (×5): 25 g via INTRAVENOUS
  Filled 2021-02-17 (×5): qty 250

## 2021-02-17 MED ORDER — ONDANSETRON HCL 4 MG/2ML IJ SOLN
4.0000 mg | Freq: Four times a day (QID) | INTRAMUSCULAR | Status: DC | PRN
Start: 2021-02-17 — End: 2021-02-23
  Administered 2021-02-17: 4 mg via INTRAVENOUS
  Filled 2021-02-17: qty 2

## 2021-02-17 MED ORDER — MORPHINE SULFATE (PF) 2 MG/ML IV SOLN
2.0000 mg | Freq: Once | INTRAVENOUS | Status: AC
  Administered 2021-02-17: 2 mg via INTRAVENOUS
  Filled 2021-02-17: qty 1

## 2021-02-17 MED ORDER — MONTELUKAST SODIUM 10 MG PO TABS
10.0000 mg | ORAL_TABLET | Freq: Every day | ORAL | Status: DC
  Administered 2021-02-17 – 2021-02-22 (×6): 10 mg via ORAL
  Filled 2021-02-17 (×6): qty 1

## 2021-02-17 MED ORDER — PANTOPRAZOLE SODIUM 40 MG PO TBEC
40.0000 mg | DELAYED_RELEASE_TABLET | Freq: Two times a day (BID) | ORAL | Status: DC
  Administered 2021-02-17 – 2021-02-23 (×14): 40 mg via ORAL
  Filled 2021-02-17 (×14): qty 1

## 2021-02-17 MED ORDER — ALPRAZOLAM 0.25 MG PO TABS
0.2500 mg | ORAL_TABLET | Freq: Every day | ORAL | Status: DC | PRN
  Administered 2021-02-17 – 2021-02-22 (×6): 0.25 mg via ORAL
  Filled 2021-02-17 (×6): qty 1

## 2021-02-17 MED ORDER — CYANOCOBALAMIN 1000 MCG/ML IJ SOLN
1000.0000 ug | INTRAMUSCULAR | Status: AC
  Administered 2021-02-17: 1000 ug via INTRAMUSCULAR
  Filled 2021-02-17: qty 1

## 2021-02-17 NOTE — Evaluation (Signed)
Physical Therapy Evaluation Patient Details Name: Alexa Nichols MRN: [id number] DOB: 15-Oct-1945 Today's Date: 02/17/2021   History of Present Illness  Alexa Nichols is a 75 y.o. female presents with complaints of BLE weakness. PMH: seasonal allergies, lobular carcinoma of L breast, GERD, COPD    Clinical Impression  Pt admitted with above diagnosis. Pt able to perform quad set, LAQ, but unable to perform SLR and minimal seated hip flexion bil. Pt requiring mod A with mobility, limited in steps with RW due to pt reports bil knee buckling sensation so sat in recliner. Pt reports normal numbness in plantar aspect of bil feet, denies loss of b/b, only pain complaints on eval are upper back between shoulder blades that feels the same as trigger point pain she previously had. Pt with spouse at home who has been assisting her for the past month, also with son and daughter in law next door as needed. Pt may benefit from ST SNF placement prior to return home for strengthening if family unable to provide 24 hr assist. Pt currently with functional limitations due to the deficits listed below (see PT Problem List). Pt will benefit from skilled PT to increase their independence and safety with mobility to allow discharge to the venue listed below.       Follow Up Recommendations Supervision/Assistance - 24 hour (SNF vs HHPT pending family availability)    Equipment Recommendations  None recommended by PT    Recommendations for Other Services       Precautions / Restrictions Precautions Precautions: Fall Restrictions Weight Bearing Restrictions: No      Mobility  Bed Mobility Overal bed mobility: Needs Assistance Bed Mobility: Supine to Sit  Supine to sit: Mod assist;HOB elevated  General bed mobility comments: mod A with HOB elevated and use of bedrail to upright trunk, increased time and effort to slide BLE off of bed, pulls on therapist's hand to scoot out to EOB    Transfers Overall  transfer level: Needs assistance Equipment used: Rolling walker (2 wheeled) Transfers: Sit to/from Stand Sit to Stand: Mod assist    General transfer comment: VCs for hand placement, slow to rise and cues for glute/quad activation to power up, ultimately mod A to power to stand  Ambulation/Gait Ambulation/Gait assistance: Min assist Gait Distance (Feet): 4 Feet Assistive device: Rolling walker (2 wheeled) Gait Pattern/deviations: Step-to pattern;Shuffle     General Gait Details: short, slow, steady, shuffling steps with RW, no knee buckling noted but pt reports "feels like they are going to give out" so cued to sit in recliner  Stairs            Wheelchair Mobility    Modified Rankin (Stroke Patients Only)       Balance Overall balance assessment: Needs assistance Sitting-balance support: Feet supported Sitting balance-Leahy Scale: Fair Sitting balance - Comments: seated EOB   Standing balance support: During functional activity;Bilateral upper extremity supported Standing balance-Leahy Scale: Poor Standing balance comment: reliant on UE support        Pertinent Vitals/Pain Pain Assessment: 0-10 Pain Score: 6  Pain Location: back between shoulder blades Pain Descriptors / Indicators:  ("trigger point") Pain Intervention(s): Limited activity within patient's tolerance;Monitored during session;Premedicated before session;Repositioned    Home Living Family/patient expects to be discharged to:: Private residence Living Arrangements: Spouse/significant other Available Help at Discharge: Family;Friend(s);Available PRN/intermittently Type of Home: House Home Access: Stairs to enter Entrance Stairs-Rails: Psychiatric nurse of Steps: 3 Home Layout: One level Home Equipment: Walker - 4  wheels;Bedside commode      Prior Function Level of Independence: Needs assistance   Gait / Transfers Assistance Needed: spouse assisting with in/out of bed,  transfers  ADL's / Homemaking Assistance Needed: spouse performing cooking, cleaning; pt taking sponge baths, spouse assists with toileting  Comments: Pt reports independent prior to hospitalization last month     Hand Dominance   Dominant Hand: Right    Extremity/Trunk Assessment   Upper Extremity Assessment Upper Extremity Assessment: Defer to OT evaluation    Lower Extremity Assessment Lower Extremity Assessment: RLE deficits/detail;LLE deficits/detail (symmetrical LEs) RLE Deficits / Details: ankle AROM WNL, knee and hip AROM ~50%, knee extension and flexion 3-/5, hip flexion 2-/5, hip abduction 2+/5 and hip adduction 2+/5 RLE Sensation: WNL RLE Coordination: WNL LLE Deficits / Details: ankle AROM WNL, knee and hip AROM ~50%, knee extension and flexion 3-/5, hip flexion 2-/5, hip abduction 2+/5 and hip adduction 2+/5 LLE Sensation: WNL LLE Coordination: WNL    Cervical / Trunk Assessment Cervical / Trunk Assessment: Normal  Communication   Communication: No difficulties  Cognition Arousal/Alertness: Awake/alert Behavior During Therapy: WFL for tasks assessed/performed Overall Cognitive Status: Within Functional Limits for tasks assessed     General Comments      Exercises General Exercises - Lower Extremity Long Arc Quad: Seated;AROM;Strengthening;Both;10 reps   Assessment/Plan    PT Assessment Patient needs continued PT services  PT Problem List Decreased strength;Decreased range of motion;Decreased activity tolerance;Decreased balance;Decreased mobility;Obesity       PT Treatment Interventions DME instruction;Gait training;Functional mobility training;Therapeutic activities;Therapeutic exercise;Balance training;Neuromuscular re-education;Patient/family education    PT Goals (Current goals can be found in the Care Plan section)  Acute Rehab PT Goals Patient Stated Goal: to figure out what is going on with her legs PT Goal Formulation: With patient Time For  Goal Achievement: 03/03/21 Potential to Achieve Goals: Good    Frequency Min 3X/week   Barriers to discharge        Co-evaluation               AM-PAC PT "6 Clicks" Mobility  Outcome Measure Help needed turning from your back to your side while in a flat bed without using bedrails?: A Little Help needed moving from lying on your back to sitting on the side of a flat bed without using bedrails?: A Lot Help needed moving to and from a bed to a chair (including a wheelchair)?: A Lot Help needed standing up from a chair using your arms (e.g., wheelchair or bedside chair)?: A Lot Help needed to walk in hospital room?: A Lot Help needed climbing 3-5 steps with a railing? : Total 6 Click Score: 12    End of Session Equipment Utilized During Treatment: Gait belt Activity Tolerance: Patient limited by fatigue;Patient limited by pain Patient left: in chair;with call bell/phone within reach;Other (comment) (OT in room) Nurse Communication: Mobility status PT Visit Diagnosis: Unsteadiness on feet (R26.81);Other abnormalities of gait and mobility (R26.89);Muscle weakness (generalized) (M62.81)    Time: 7564-3329 PT Time Calculation (min) (ACUTE ONLY): 19 min   Charges:   PT Evaluation $PT Eval Moderate Complexity: 1 Mod           Tori Drago Hammonds PT, DPT 02/17/21, 9:13 AM

## 2021-02-17 NOTE — Plan of Care (Signed)
  Problem: Acute Rehab PT Goals(only PT should resolve) Goal: Pt Will Go Supine/Side To Sit Outcome: Progressing Flowsheets (Taken 02/17/2021 0916) Pt will go Supine/Side to Sit: with min guard assist Goal: Patient Will Transfer Sit To/From Stand Outcome: Progressing Flowsheets (Taken 02/17/2021 0916) Patient will transfer sit to/from stand: with minimal assist Goal: Pt Will Transfer Bed To Chair/Chair To Bed Outcome: Progressing Flowsheets (Taken 02/17/2021 0916) Pt will Transfer Bed to Chair/Chair to Bed: with min assist Goal: Pt Will Ambulate Outcome: Progressing Flowsheets (Taken 02/17/2021 0916) Pt will Ambulate:  25 feet  with minimal assist  with rolling walker   Tori Ariya Bohannon PT, DPT 02/17/21, 9:17 AM

## 2021-02-17 NOTE — Consult Note (Addendum)
[place] A. Merlene Laughter, MD     [email or web address]          Alexa Nichols is an 75 y.o. female.   ASSESSMENT/PLAN: 1.  Progressive acute/subacute leg weakness with areflexia most consistent with acute inflammatory demyelinating poly-radicularneuritis/Guillain-Barr syndrome.  The patient will be treated as such with immunoglobulins per the typical protocol.  The spinal fluid analysis will be obtained when possible.  This requires to hold heparin or Lovenox for now.  Pulmonary function test will also be obtained to assess her baseline and probably should be done daily.  Consider placing the patient in ICU if the [facility] gets below 58mL per kilogram.  Patient should be continued on telemetry for now.  PT OT will be required. 2.  Vitamin B12 deficiency which will be replaced 3.  Chronic low back pain 4.  Chronic statin use: Statin will be discontinued for a few months in case she has statin induced myopathy either the acute or HMG coenzyme reductase antibody mediated statin myopathy.    The patient is a 76 year old white female who was hospitalized for 1 week about 2 weeks ago the early part of May for anemia.  She tell me this is her third bout of anemia requiring hospitalization.  She reports that she was weak and after being treated she had improvement in her global weakness but she reported persistent leg weakness.  This seemed to have progressed over the next week with the patient falling twice resulted in the patient seeking medical assistance.  She reports also having numbness of the feet over the past week.  She denies any weakness or numbness of the upper extremities.  She denies dysarthria, dyspnea, dysphagia, diplopia or other visual problems.  She indicates she does have some chronic mid to upper back pain and also lower back pain.  The review of system otherwise unrevealing.    GENERAL: This a pleasant female who is doing well at this time.  HEENT: Neck is supple no  trauma noted.  ABDOMEN: soft  EXTREMITIES: No edema   BACK: Normal  SKIN: Normal by inspection.    MENTAL STATUS: Alert and oriented. Speech, language and cognition are generally intact. Judgment and insight normal.   CRANIAL NERVES: Pupils are equal, round and reactive to light and accomodation; extra ocular movements are full, there is no significant nystagmus; visual fields are full; upper and lower facial muscles are normal in strength and symmetric, there is no flattening of the nasolabial folds; tongue is midline; uvula is midline; shoulder elevation is normal.  MOTOR: Deltoids are 4+/5.  Handgrip 5.  Bulk and tone are normal throughout.  Hip flexion shows marked weakness 2/5.  Dorsiflexion 4/5 bilaterally.  COORDINATION: Left finger to nose is normal, right finger to nose is normal, No rest tremor; no intention tremor; no postural tremor; no bradykinesia.  REFLEXES: Deep tendon reflexes are symmetrical and normal in the upper extremities but absent in the legs. Plantar reflexes are flexor bilaterally.   SENSATION: This is slightly diminished in the feet bilaterally.      Blood pressure 137/72, pulse 90, temperature 98.6 F (37 C), temperature source Oral, resp. rate 18, height 4\' 10"  (1.473 m), weight 62.6 kg, SpO2 97 %.  Past Medical History:  Diagnosis Date  . Asthma   . Chronic low back pain   . COPD (chronic obstructive pulmonary disease) ([place])   . Depression   . GERD (gastroesophageal reflux disease)   . Heart murmur   . Lobular carcinoma  of left breast (HCC) 1997   in situ  . Other and unspecified hyperlipidemia   . Seasonal allergies     Past Surgical History:  Procedure Laterality Date  . BIOPSY  12/03/2020   Procedure: BIOPSY;  Surgeon: Malissa Hippoehman, Najeeb U, MD;  [facility];  Service: Endoscopy;;  gastric polyps biopsies;  . BREAST SURGERY Left    lumpectomy   . CESAREAN SECTION  1986  . CHOLECYSTECTOMY    . COLONOSCOPY WITH PROPOFOL N/A  12/03/2020    two 4-7 mm polyps in cecum, one small polyp in cecum, one 5 mm polyp, sigmoid diverticulosis, path with tubular adenomas  . ESOPHAGOGASTRODUODENOSCOPY (EGD) WITH PROPOFOL N/A 12/03/2020   normal esophagus, multiple gastric polyps s/p biopsy, small paraesophageal hernia, normal duodenum. Fundic gland polyps.  . ESOPHAGOGASTRODUODENOSCOPY (EGD) WITH PROPOFOL N/A 02/05/2021   Procedure: ESOPHAGOGASTRODUODENOSCOPY (EGD) WITH PROPOFOL;  Surgeon: Corbin Adeourk, Robert M, MD;  [facility];  Service: Endoscopy;  Laterality: N/A;  . GIVENS CAPSULE STUDY N/A 12/16/2020   normal small bowel capsule but rapid transit time of 21 minutes. Recommended to resume alendronate.   Marland Kitchen. GIVENS CAPSULE STUDY N/A 02/04/2021   Procedure: GIVENS CAPSULE STUDY;  Surgeon: Malissa Hippoehman, Najeeb U, MD;  [facility];  Service: Endoscopy;  Laterality: N/A;  . HOT HEMOSTASIS  02/05/2021   Procedure: HOT HEMOSTASIS (ARGON PLASMA COAGULATION/BICAP);  Surgeon: Corbin Adeourk, Robert M, MD;  [facility];  Service: Endoscopy;;  . TONSILLECTOMY     age 75    Family History  Problem Relation Age of Onset  . Osteoporosis Mother   . Other Father        head trauma  . Breast cancer Sister        X's 2 sisters    Social History:  reports that she quit smoking about 37 years ago. Her smoking use included cigarettes. She started smoking about 56 years ago. She has a 10.00 pack-year smoking history. She has never used smokeless tobacco. She reports that she does not drink alcohol and does not use drugs.  Allergies:  Allergies  Allergen Reactions  . Aspirin     samter's syndrome  . Codeine Itching  . Sulfa Antibiotics     Unknown reaction  . Other Rash    gel filled fentanyl patch, adhesive on that patch caused ithcing    Medications: Prior to Admission medications   Medication Sig Start Date End Date Taking? Authorizing Provider  acetaminophen (TYLENOL) 500 MG tablet Take 500 mg by mouth every 6 (six) hours  as needed for moderate pain or headache.   Yes [provider]  ALPRAZolam Prudy Feeler(XANAX) 0.5 MG tablet Take 0.25 mg by mouth daily as needed for anxiety.   Yes [provider]  fentaNYL (DURAGESIC - DOSED MCG/HR) 25 MCG/HR patch Place 25 mcg onto the skin See admin instructions. Place 25 mcg onto the skin for 72 hours then remove as needed for pain   Yes [provider]  ferrous sulfate 325 (65 FE) MG tablet Take 1 tablet (325 mg total) by mouth daily with breakfast. 02/08/21  Yes Marguerita Merlesastaneda Mayorga, Reuel Boomaniel, MD  HYDROcodone-acetaminophen (NORCO/VICODIN) 5-325 MG per tablet Take 1 tablet by mouth 2 (two) times daily as needed for moderate pain.    Yes [provider]  montelukast (SINGULAIR) 10 MG tablet Take 10 mg by mouth at bedtime.   Yes [provider]  Multiple Vitamins-Minerals (PRESERVISION AREDS 2) CAPS Take 1 capsule by mouth in the  morning and at bedtime.   Yes [provider]  pantoprazole (PROTONIX) 40 MG tablet Take 1 tablet (40 mg total) by mouth 2 (two) times daily. 02/07/21 05/08/21 Yes Manuella Ghazi, Pratik D, DO  Pediatric Multivitamins-Iron [facility] COMPLETE) 18 MG CHEW Chew 1 tablet by mouth 2 (two) times daily. 01/27/21  Yes Rehman, Mechele Dawley, MD  sertraline (ZOLOFT) 50 MG tablet Take 25 mg by mouth daily. 01/26/16  Yes [provider]  simvastatin (ZOCOR) 20 MG tablet Take 20 mg by mouth daily.   Yes [provider]  tiZANidine (ZANAFLEX) 4 MG tablet Take 2 mg by mouth at bedtime as needed for muscle spasms.   Yes [provider]  TRELEGY ELLIPTA 200-62.5-25 MCG/INH AEPB Inhale 1 puff into the lungs daily. 12/21/20  Yes [provider]    Scheduled Meds: .  stroke: mapping our early stages of recovery book   Does not apply Once  . ezetimibe  10 mg Oral Daily  . fluticasone furoate-vilanterol  1 puff Inhalation Daily  . gabapentin  100 mg Oral TID  . heparin  5,000 Units Subcutaneous Q8H  . montelukast  10  mg Oral QHS  . pantoprazole  40 mg Oral BID  . sertraline  25 mg Oral Daily  . simvastatin  20 mg Oral Daily  . umeclidinium bromide  1 puff Inhalation Daily   Continuous Infusions: PRN Meds:.acetaminophen **OR** acetaminophen (TYLENOL) oral liquid 160 mg/5 mL **OR** acetaminophen, ALPRAZolam, HYDROcodone-acetaminophen, ondansetron (ZOFRAN) IV     Results for orders placed or performed during the hospital encounter of 02/16/21 (from the past 48 hour(s))  CBC with Differential     Status: Abnormal   Collection Time: 02/16/21  2:21 PM  Result Value Ref Range   WBC 11.8 (H) 4.0 - 10.5 K/uL   RBC 4.34 3.87 - 5.11 MIL/uL   Hemoglobin 11.9 (L) 12.0 - 15.0 g/dL   HCT 40.0 36.0 - 46.0 %   MCV 92.2 80.0 - 100.0 fL   MCH 27.4 26.0 - 34.0 pg   MCHC 29.8 (L) 30.0 - 36.0 g/dL   RDW 15.2 11.5 - 15.5 %   Platelets 406 (H) 150 - 400 K/uL   nRBC 0.0 0.0 - 0.2 %   Neutrophils Relative % 64 %   Neutro Abs 7.7 1.7 - 7.7 K/uL   Lymphocytes Relative 20 %   Lymphs Abs 2.3 0.7 - 4.0 K/uL   Monocytes Relative 11 %   Monocytes Absolute 1.3 (H) 0.1 - 1.0 K/uL   Eosinophils Relative 3 %   Eosinophils Absolute 0.4 0.0 - 0.5 K/uL   Basophils Relative 1 %   Basophils Absolute 0.1 0.0 - 0.1 K/uL   Immature Granulocytes 1 %   Abs Immature Granulocytes 0.08 (H) 0.00 - 0.07 K/uL    Comment: Performed at [facility], [address], [place], [place] [address]  Basic metabolic panel     Status: Abnormal   Collection Time: 02/16/21  2:21 PM  Result Value Ref Range   Sodium 138 135 - 145 mmol/L   Potassium 4.5 3.5 - 5.1 mmol/L   Chloride 101 98 - 111 mmol/L   CO2 26 22 - 32 mmol/L   Glucose, Bld 117 (H) 70 - 99 mg/dL    Comment: Glucose reference range applies only to samples taken after fasting for at least 8 hours.   BUN 14 8 - 23 mg/dL   Creatinine, Ser 0.59 0.44 - 1.00 mg/dL   Calcium 10.0 8.9 - 10.3  mg/dL   GFR, Estimated >60 >60 mL/min    Comment: (NOTE) Calculated using the CKD-EPI Creatinine  Equation (2021)    Anion gap 11 5 - 15    Comment: Performed at [facility], [address] [place], [place] [address]  Vitamin B12     Status: None   Collection Time: 02/16/21  2:21 PM  Result Value Ref Range   Vitamin B-12 205 180 - 914 pg/mL    Comment: (NOTE) This assay is not validated for testing neonatal or myeloproliferative syndrome specimens for Vitamin B12 levels. Performed at [facility], [address] [place], [place] [address]   VITAMIN D 25 Hydroxy (Vit-D Deficiency, Fractures)     Status: None   Collection Time: 02/16/21  2:21 PM  Result Value Ref Range   Vit D, 25-Hydroxy 31.39 30 - 100 ng/mL    Comment: (NOTE) Vitamin D deficiency has been defined by the [place] practice guideline as a level of serum 25-OH  vitamin D less than 20 ng/mL (1,2). The Endocrine Society went on to  further define vitamin D insufficiency as a level between 21 and 29  ng/mL (2).  1. IOM (Institute of Medicine). 2010. Dietary reference intakes for  calcium and D. [place]: The [employer]. 2. Holick MF, Binkley Numa, Bischoff-Ferrari HA, et al. Evaluation,  treatment, and prevention of vitamin D deficiency: an Endocrine  Society clinical practice guideline, JCEM. 2011 Jul; 96(7): 1911-30.  Performed at [facility], [place] [address] [place], [place] [address]   Urinalysis, Routine w reflex microscopic Urine, Clean Catch     Status: Abnormal   Collection Time: 02/16/21  4:00 PM  Result Value Ref Range   Color, Urine STRAW (A) YELLOW   APPearance CLEAR CLEAR   Specific Gravity, Urine 1.008 1.005 - 1.030   pH 5.0 5.0 - 8.0   Glucose, UA NEGATIVE NEGATIVE mg/dL   Hgb urine dipstick NEGATIVE NEGATIVE   Bilirubin Urine NEGATIVE NEGATIVE   Ketones, ur NEGATIVE NEGATIVE mg/dL   Protein, ur NEGATIVE NEGATIVE mg/dL   Nitrite NEGATIVE NEGATIVE   Leukocytes,Ua NEGATIVE NEGATIVE    Comment: Performed at [facility], [address] [place], [place] [address]  Troponin I (High Sensitivity)     Status: None   Collection Time: 02/16/21  5:52 PM  Result Value Ref Range   Troponin I (High Sensitivity) 4 <18 ng/L    Comment: (NOTE) Elevated high sensitivity troponin I (hsTnI) values and significant  changes across serial measurements may suggest ACS but many other  chronic and acute conditions are known to elevate hsTnI results.  Refer to the "Links" section for chest pain algorithms and additional  guidance. Performed at [facility], [address], [place], [place] [address]   Troponin I (High Sensitivity)     Status: None   Collection Time: 02/16/21  7:16 PM  Result Value Ref Range   Troponin I (High Sensitivity) 5 <18 ng/L    Comment: (NOTE) Elevated high sensitivity troponin I (hsTnI) values and significant  changes across serial measurements may suggest ACS but many other  chronic and acute conditions are known to elevate hsTnI results.  Refer to the "Links" section for chest pain algorithms and additional  guidance. Performed at [facility], [address] [place],  [address]   Resp Panel by RT-PCR (Flu A&B, Covid) Nasopharyngeal Swab     Status: None   Collection Time: 02/16/21  8:11  PM   Specimen: Nasopharyngeal Swab; Nasopharyngeal(NP) swabs in vial transport medium  Result Value Ref Range   SARS Coronavirus 2 by RT PCR NEGATIVE NEGATIVE    Comment: (NOTE) SARS-CoV-2 target nucleic acids are NOT DETECTED.  The SARS-CoV-2 RNA is generally detectable in upper respiratory specimens during the acute phase of infection. The lowest concentration of SARS-CoV-2 viral copies this assay can detect is 138 copies/mL. A negative result does not preclude SARS-Cov-2 infection and should not be used as the sole basis for treatment or other patient management decisions. A negative result may occur with  improper specimen collection/handling, submission of specimen other than nasopharyngeal swab, presence  of viral mutation(s) within the areas targeted by this assay, and inadequate number of viral copies(<138 copies/mL). A negative result must be combined with clinical observations, patient history, and epidemiological information. The expected result is Negative.  Fact Sheet for Patients:  EntrepreneurPulse.com.au  Fact Sheet for Healthcare Providers:  IncredibleEmployment.be  This test is no t yet approved or cleared by the [place] FDA and  has been authorized for detection and/or diagnosis of SARS-CoV-2 by FDA under an Emergency Use Authorization (EUA). This EUA will remain  in effect (meaning this test can be used) for the duration of the COVID-19 declaration under Section 564(b)(1) of the Act, 21 U.S.C.section 360bbb-3(b)(1), unless the authorization is terminated  or revoked sooner.       Influenza A by PCR NEGATIVE NEGATIVE   Influenza B by PCR NEGATIVE NEGATIVE    Comment: (NOTE) The Xpert Xpress SARS-CoV-2/FLU/RSV plus assay is intended as an aid in the diagnosis of influenza from Nasopharyngeal swab specimens and should not be used as a sole basis for treatment. Nasal washings and aspirates are unacceptable for Xpert Xpress SARS-CoV-2/FLU/RSV testing.  Fact Sheet for Patients: EntrepreneurPulse.com.au  Fact Sheet for Healthcare Providers: IncredibleEmployment.be  This test is not yet approved or cleared by the [place] FDA and has been authorized for detection and/or diagnosis of SARS-CoV-2 by FDA under an Emergency Use Authorization (EUA). This EUA will remain in effect (meaning this test can be used) for the duration of the COVID-19 declaration under Section 564(b)(1) of the Act, 21 U.S.C. section 360bbb-3(b)(1), unless the authorization is terminated or revoked.  Performed at [facility], [address], [place], [place] [address]   Hemoglobin A1c     Status: Abnormal    Collection Time: 02/17/21  6:14 AM  Result Value Ref Range   Hgb A1c MFr Bld 4.6 (L) 4.8 - 5.6 %    Comment: (NOTE) Pre diabetes:          5.7%-6.4%  Diabetes:              >6.4%  Glycemic control for   <7.0% adults with diabetes    Mean Plasma Glucose 85.32 mg/dL    Comment: Performed at [facility] [address], [place], [place] [address]  Lipid panel     Status: Abnormal   Collection Time: 02/17/21  6:14 AM  Result Value Ref Range   Cholesterol 161 0 - 200 mg/dL   Triglycerides 285 (H) <150 mg/dL   HDL 36 (L) >40 mg/dL   Total CHOL/HDL Ratio 4.5 RATIO   VLDL 57 (H) 0 - 40 mg/dL   LDL Cholesterol 68 0 - 99 mg/dL    Comment:        Total Cholesterol/HDL:CHD Risk Coronary Heart Disease Risk Table  Men   Women  1/2 Average Risk   3.4   3.3  Average Risk       5.0   4.4  2 X Average Risk   9.6   7.1  3 X Average Risk  23.4   11.0        Use the calculated Patient Ratio above and the CHD Risk Table to determine the patient's CHD Risk.        ATP III CLASSIFICATION (LDL):  <100     mg/dL   Optimal  100-129  mg/dL   Near or Above                    Optimal  130-159  mg/dL   Borderline  160-189  mg/dL   High  >190     mg/dL   Very High Performed at [facility], [address] [place], [place] [address]   CBC     Status: Abnormal   Collection Time: 02/17/21  6:14 AM  Result Value Ref Range   WBC 8.8 4.0 - 10.5 K/uL   RBC 3.99 3.87 - 5.11 MIL/uL   Hemoglobin 11.0 (L) 12.0 - 15.0 g/dL   HCT 36.2 36.0 - 46.0 %   MCV 90.7 80.0 - 100.0 fL   MCH 27.6 26.0 - 34.0 pg   MCHC 30.4 30.0 - 36.0 g/dL   RDW 15.1 11.5 - 15.5 %   Platelets 342 150 - 400 K/uL   nRBC 0.0 0.0 - 0.2 %    Comment: Performed at [facility], [address] [place], [place] [address]  Comprehensive metabolic panel     Status: Abnormal   Collection Time: 02/17/21  6:14 AM  Result Value Ref Range   Sodium 139 135 - 145 mmol/L   Potassium 4.3 3.5 - 5.1 mmol/L   Chloride 102  98 - 111 mmol/L   CO2 27 22 - 32 mmol/L   Glucose, Bld 104 (H) 70 - 99 mg/dL    Comment: Glucose reference range applies only to samples taken after fasting for at least 8 hours.   BUN 14 8 - 23 mg/dL   Creatinine, Ser 0.46 0.44 - 1.00 mg/dL   Calcium 9.0 8.9 - 10.3 mg/dL   Total Protein 7.2 6.5 - 8.1 g/dL   Albumin 3.9 3.5 - 5.0 g/dL   AST 22 15 - 41 U/L   ALT 18 0 - 44 U/L   Alkaline Phosphatase 88 38 - 126 U/L   Total Bilirubin 0.4 0.3 - 1.2 mg/dL   GFR, Estimated >60 >60 mL/min    Comment: (NOTE) Calculated using the CKD-EPI Creatinine Equation (2021)    Anion gap 10 5 - 15    Comment: Performed at [facility], [address], [place], [place] [address]    Studies/Results:  BRAIN MRI  FINDINGS: Brain: No acute infarction, hemorrhage, hydrocephalus, or extra-axial fluid collection. Mild for age T2/FLAIR hyperintensities within the white matter, most likely related to chronic microvascular ischemic disease. Incidental 11 mm calcified extra-axial, dural-based lesion along the posterior right vertex (series 11, image 23), likely a meningioma. No mass effect or surrounding edema.  Vascular: Major arterial flow voids are maintained at the skull base.  Skull and upper cervical spine: Normal marrow signal.  Sinuses/Orbits: Evidence of prior endoscopic sinus surgery with bilateral maxillary antrostomies and ethmoidectomies. Moderate pansinus mucosal thickening, greatest in the ethmoid air cells and sphenoid sinuses. Air-fluid level in the right sphenoid sinus.  Other: Small right  mastoid effusion.  IMPRESSION: 1. No evidence of acute intracranial abnormality. Specifically, no acute infarct. 2. Incidental presumed 11 mm meningioma along the vertex without mass effect or surrounding edema. 3. Evidence of prior endoscopic sinus surgery with moderate paranasal sinus mucosal thickening, as detailed above.    L SPINE MRI IMPRESSION: Lumbar spondylosis, as outlined  and with findings most notably as follows.  At L5-S1, there is trace grade 1 anterolisthesis. Mild disc degeneration. Disc bulge. Mild endplate spurring. Facet arthrosis/ligamentum flavum hypertrophy. Bilateral subarticular narrowing (mild right, mild/moderate left) with some crowding of the left greater than right descending S1 nerve roots. Mild relative bilateral neural foraminal narrowing.  At L3-L4, there is trace retrolisthesis. Mild disc degeneration. Superimposed shallow broad-based right subarticular to right foraminal disc protrusion. Mild right subarticular narrowing without frank nerve root impingement. Minimal relative right neural foraminal narrowing without nerve root impingement.  No significant spinal canal or neural foraminal stenosis at the remaining levels.    T SPINE MRIIMPRESSION: Exaggerated thoracic kyphosis and mild degenerative changes without high-grade spinal canal or neural foraminal stenosis at any level.   BRAIN MRI reviewed in person no stroke; hemorrhage; minimal PVWD;     [place] A. Merlene Laughter, M.D.  Diplomate, Tax adviser of Psychiatry and Neurology ( Neurology). 02/17/2021, 6:13 PM

## 2021-02-17 NOTE — TOC Initial Note (Signed)
Transition of Care [facility]) - Initial/Assessment Note    Patient Details  Name: Alexa Nichols MRN: [id number] Date of Birth: Jul 17, 1946  Transition of Care [facility]) CM/SW Contact:    Boneta Lucks, RN Phone Number: 02/17/2021, 11:14 AM  Clinical Narrative:      Patient admitted with Lower extremity weakness. Patient lives at home with her spouse. PT is recommending 24 hour supervision. Patient states she has a walker and her husband will be with her. She wants to go home with home health. [facility] is [facility] and will fax orders when placed, MD updated.              Expected Discharge Plan: [place] Barriers to Discharge: Continued Medical Work up   Patient Goals and CMS Choice Patient states their goals for this hospitalization and ongoing recovery are:: to go home with Home health CMS Medicare.gov Compare Post Acute Care list provided to:: Patient    Expected Discharge Plan and Services Expected Discharge Plan: [place]     Living arrangements for the past 2 months: Single Family Home                  HH Arranged: PT   Date [place]: 02/17/21 Time [place]: [id number] Representative spoke with at [place]: Spoke to [employer] in [place], will fax orders.  Prior Living Arrangements/Services Living arrangements for the past 2 months: Single Family Home Lives with:: Spouse Patient language and need for interpreter reviewed:: Yes Do you feel safe going back to the place where you live?: Yes      Need for Family Participation in Patient Care: Yes (Comment) Care giver support system in place?: Yes (comment)   Criminal Activity/Legal Involvement Pertinent to Current Situation/Hospitalization: No - Comment as needed  Activities of Daily Living Home Assistive Devices/Equipment: Walker (specify type) (4 wheel walker) ADL Screening (condition at time of admission) Patient's cognitive  ability adequate to safely complete daily activities?: Yes Is the patient deaf or have difficulty hearing?: No Does the patient have difficulty seeing, even when wearing glasses/contacts?: No Does the patient have difficulty concentrating, remembering, or making decisions?: No Patient able to express need for assistance with ADLs?: Yes Does the patient have difficulty dressing or bathing?: Yes (due to weakness in legs) Independently performs ADLs?: No Communication: Independent Dressing (OT): Needs assistance Is this a change from baseline?: Pre-admission baseline Grooming: Needs assistance Is this a change from baseline?: Pre-admission baseline Feeding: Independent Bathing: Needs assistance Is this a change from baseline?: Pre-admission baseline Toileting: Needs assistance Is this a change from baseline?: Pre-admission baseline In/Out Bed: Needs assistance Is this a change from baseline?: Pre-admission baseline Walks in Home: Needs assistance (uses walker.) Is this a change from baseline?: Pre-admission baseline Does the patient have difficulty walking or climbing stairs?: Yes Weakness of Legs: Both Weakness of Arms/Hands: None  Permission Sought/Granted    Emotional Assessment    Affect (typically observed): Accepting,Pleasant Orientation: : Oriented to Self,Oriented to Place,Oriented to  Time,Oriented to Situation Alcohol / Substance Use: Not Applicable Psych Involvement: No (comment)  Admission diagnosis:  Lower extremity weakness [R29.898] Patient Active Problem List   Diagnosis Date Noted  . Lower extremity weakness 02/16/2021  . Acute blood loss anemia 02/04/2021  . Anemia 02/03/2021  . COPD (chronic obstructive pulmonary disease) ([place]) 02/03/2021  . Depression 02/03/2021  . Intermediate stage nonexudative age-related macular degeneration of left eye 12/18/2020  . Microcytic anemia  11/05/2020  . Melena 11/05/2020  . Early stage nonexudative age-related macular  degeneration of left eye 07/21/2020  . Degenerative retinal drusen of right eye 02/14/2020  . Early stage nonexudative age-related macular degeneration of right eye 02/14/2020  . Exudative age-related macular degeneration of right eye with active choroidal neovascularization ([place]) 02/14/2020  . History of vitrectomy 02/14/2020  . Chest pain 01/15/2014  . Mitral regurgitation 01/15/2014  . Hyperlipidemia 01/15/2014   PCP:  [name], MD Pharmacy:   [id number] - [place], [place] [address] [place] [address] Phone: [phone] Fax: [phone]

## 2021-02-17 NOTE — Progress Notes (Signed)
Patient gave good effort X3, and was able to achieve -18 on NIF and 1.3L on VC.

## 2021-02-17 NOTE — Progress Notes (Signed)
Patient seen and examined.  Admitted after midnight secondary to sudden presentation of lower extremity weakness.  Patient also expressed intermittent episode of numbness and tingling going down her legs that have been present for the last 2 weeks or so.  There is no chest pain, no nausea, no vomiting, no blurred vision, no other focal deficits.  Please refer to H&P written by Dr. Clearence Ped for further info/details on presentation.  Plan: -CT head and MRI without acute intracranial abnormalities ruling out the presence of acute stroke. -MRI of lumbar spine ordered and pending at this time -Neurology service has also been consulted and will follow recommendation -Empirical therapy with Neurontin has been started -Physical therapy have seen patient with recommendations for outpatient rehabilitation. -Continue supportive care and as needed analgesics -Follow clinical response. -Hopefully able to go home in the next 24 hours.  Barton Dubois MD [phone]

## 2021-02-17 NOTE — Care Management Obs Status (Signed)
[place] NOTIFICATION   Patient Details  Name: Alexa Nichols MRN: [id number] Date of Birth: 09-01-1946   Medicare Observation Status Notification Given:  Yes    Boneta Lucks, RN 02/17/2021, 2:23 PM

## 2021-02-17 NOTE — Plan of Care (Signed)
  Problem: Acute Rehab OT Goals (only OT should resolve) Goal: Pt. Will Perform Grooming Flowsheets (Taken 02/17/2021 1153) Pt Will Perform Grooming:  with min guard assist  standing  with supervision Goal: Pt. Will Perform Upper Body Dressing Flowsheets (Taken 02/17/2021 1153) Pt Will Perform Upper Body Dressing:  with modified independence  sitting  Independently Goal: Pt. Will Perform Lower Body Dressing Flowsheets (Taken 02/17/2021 1153) Pt Will Perform Lower Body Dressing:  with min guard assist  with supervision  with adaptive equipment  sitting/lateral leans  sit to/from stand Goal: Pt. Will Transfer To Toilet Flowsheets (Taken 02/17/2021 1153) Pt Will Transfer to Toilet:  with supervision  with min guard assist  stand pivot transfer  ambulating Goal: Pt/Caregiver Will Perform Home Exercise Program Flowsheets (Taken 02/17/2021 1153) Pt/caregiver will Perform Home Exercise Program:  Increased strength  Both right and left upper extremity  With Supervision  Independently  Yuki Purves OT, MOT

## 2021-02-17 NOTE — Evaluation (Signed)
Occupational Therapy Evaluation Patient Details Name: Alexa Nichols MRN: [id number] DOB: [birth year] Today's Date: 02/17/2021    History of Present Illness Alexa Nichols is a 75 y.o. female presents with complaints of BLE weakness. PMH: seasonal allergies, lobular carcinoma of L breast, GERD, COPD   Clinical Impression   Pt agreeable to OT evaluation this date. Pt has required assist for ADL's and IADL's the last month. Pt reports being independent prior to a month ago. Pt demonstrates B UE generalized weakness with formal MMT testing. Pt requires Mod A for sit to stand from chair using RW. Pt able to ambulate ~5 feet forward and backward in room with Min to Mod A. Pt will benefit from continued OT in the hospital and recommended venue below to increase strength, balance, and endurance for safe ADL's.     Follow Up Recommendations  Home health OT;SNF;Supervision/Assistance - 24 hour;Other (comment) (Pt recommended for [facility] OT provided that 24/7 SPV can be provided at home.)    Equipment Recommendations  None recommended by OT           Precautions / Restrictions Precautions Precautions: Fall Restrictions Weight Bearing Restrictions: No      Mobility Bed Mobility Overal bed mobility: Needs Assistance Bed Mobility: Supine to Sit     Supine to sit: Mod assist;HOB elevated     General bed mobility comments: mod A with HOB elevated and use of bedrail to upright trunk, increased time and effort to slide BLE off of bed, pulls on therapist's hand to scoot out to EOB    Transfers Overall transfer level: Needs assistance Equipment used: Rolling walker (2 wheeled) Transfers: Sit to/from Stand Sit to Stand: Mod assist         General transfer comment: VC to reach back for chair prior to sitting.    Balance Overall balance assessment: Needs assistance Sitting-balance support: Feet supported Sitting balance-Leahy Scale: Fair Sitting balance - Comments: seated EOB    Standing balance support: During functional activity;Bilateral upper extremity supported Standing balance-Leahy Scale: Poor Standing balance comment: reliant on UE support                           ADL either performed or assessed with clinical judgement   ADL Overall ADL's : Needs assistance/impaired                         Toilet Transfer: Moderate assistance;Stand-pivot;RW Toilet Transfer Details (indicate cue type and reason): Per clinical judgement and pt performance with sit to stand and ambulation of ~5 feet pt likely at level of Mod A with RW.                 Vision Baseline Vision/History: Wears glasses;Legally blind;Macular Degeneration Wears Glasses:  (Most of the time) Patient Visual Report: Other (comment) (Pt reports being blind in R eye due to macular degeneration.)                  Pertinent Vitals/Pain Pain Assessment: 0-10 Pain Score: 6  Pain Location: back between shoulder blades Pain Descriptors / Indicators: Constant;Aching Pain Intervention(s): Limited activity within patient's tolerance;Monitored during session;Premedicated before session     Hand Dominance Right   Extremity/Trunk Assessment Upper Extremity Assessment Upper Extremity Assessment: Generalized weakness (4/5 B shoulder MMT; B elbow flexion 4+/5; R elbow extension 4/5; 4+/5 L elbow flexion.)   Lower Extremity Assessment Lower Extremity Assessment: Defer to PT evaluation  Cervical /  Trunk Assessment Cervical / Trunk Assessment: Normal   Communication Communication Communication: No difficulties   Cognition Arousal/Alertness: Awake/alert Behavior During Therapy: WFL for tasks assessed/performed Overall Cognitive Status: Within Functional Limits for tasks assessed                                     General Comments       Exercises General Exercises - Lower Extremity Long Arc Quad: Seated;AROM;Strengthening;Both;10 reps   Shoulder  Instructions      Home Living Family/patient expects to be discharged to:: Private residence Living Arrangements: Spouse/significant other Available Help at Discharge: Family;Friend(s);Available PRN/intermittently (Pt reports she typically does not get up unless someone is home. Husband is home other than when he runs errands. Other family lives very near by. Pt believes someone could be present 24/7 if needed.) Type of Home: House Home Access: Stairs to enter [employer] of Steps: 3 Entrance Stairs-Rails: Right;Left Home Layout: One level     Bathroom Shower/Tub: Tub/shower unit         Home Equipment: Environmental consultant - 4 wheels;Bedside commode          Prior Functioning/Environment Level of Independence: Needs assistance  Gait / Transfers Assistance Needed: spouse assisting with in/out of bed, transfers ADL's / Homemaking Assistance Needed: spouse performing cooking, cleaning; pt taking sponge baths, spouse assists with toileting   Comments: Pt reports independent prior to hospitalization last month        OT Problem List: Decreased strength;Decreased range of motion;Decreased activity tolerance;Impaired balance (sitting and/or standing);Decreased knowledge of use of DME or AE      OT Treatment/Interventions: Self-care/ADL training;Therapeutic exercise;Therapeutic activities;DME and/or AE instruction;Patient/family education;Balance training    OT Goals(Current goals can be found in the care plan section) Acute Rehab OT Goals Patient Stated Goal: to return home OT Goal Formulation: With patient Time For Goal Achievement: 03/03/21 Potential to Achieve Goals: Fair  OT Frequency: Min 2X/week                             AM-PAC OT "6 Clicks" Daily Activity     Outcome Measure Help from another person eating meals?: None Help from another person taking care of personal grooming?: A Little Help from another person toileting, which includes using toliet,  bedpan, or urinal?: A Lot Help from another person bathing (including washing, rinsing, drying)?: A Lot Help from another person to put on and taking off regular upper body clothing?: None Help from another person to put on and taking off regular lower body clothing?: A Lot 6 Click Score: 17   End of Session Equipment Utilized During Treatment: Rolling walker  Activity Tolerance: Patient tolerated treatment well Patient left: in chair;with call bell/phone within reach  OT Visit Diagnosis: Unsteadiness on feet (R26.81);Muscle weakness (generalized) (M62.81);History of falling (Z91.81)                Time: 1610-9604 OT Time Calculation (min): 18 min Charges:  OT General Charges $OT Visit: 1 Visit OT Evaluation $OT Eval Low Complexity: 1 Low  Toni Hoffmeister OT, MOT   Larey Seat 02/17/2021, 11:49 AM

## 2021-02-17 NOTE — Progress Notes (Signed)
SLP Cancellation Note  Patient Details Name: Alexa Nichols MRN: [id number] DOB: September 18, 1946   Cancelled treatment:       Reason Eval/Treat Not Completed: SLP screened, no needs identified, will sign off. SLP screened Pt in room. Pt denies any changes in swallowing, speech, language, or cognition. MRI negative for acute changes. SLE will be deferred at this time. Reconsult if indicated. SLP will sign off.   Thank you,  Genene Churn, [place]    [place] 02/17/2021, 3:23 PM

## 2021-02-17 NOTE — H&P (Signed)
TRH H&P    Patient Demographics:    Alexa Nichols, is a 75 y.o. female  MRN: [id number]  DOB - 20-Oct-1945  Admit Date - 02/16/2021  Referring MD/NP/PA: Threasa Alpha  Outpatient Primary MD for the patient is [name], MD  Patient coming from: Home  Chief complaint- weakness   HPI:    Alexa Nichols  is a 75 y.o. female, with history of seasonal duties, lobular carcinoma of left breast, GERD, COPD, and more presents to ED with a chief complaint of leg weakness.  Patient reports that she was hospitalized 2 weeks ago for GI bleed and was transfused a couple units.  Since she left the hospital she has had pain in her lower back that she attributed to the awkward bed.  She went to her PCP and was prescribed PT, she was also found to be slightly hypokalemic and told to eat more fruits and foods that were high in potassium.  Patient reports when she first got home it was painful but she could get herself to the bathroom and back.  For the last few days like she can get more than 3 steps before she was weak and her legs would give out underneath her.  Patient reports that the weakness is worse as the day goes on.  On the day of presentation her husband had to help her get a walker and she could not do it without assistance.  The back pain that she has is described as aching pain.  It is worse with sitting.  Tylenol helps.  Its intermittent pain, and she has had no associated bladder incontinence bowel incontinence or numbness.  Patient does report that she has weakness mostly when flexing her hip.  It is worse on the right than the left.  She has no history of personal or family neurologic disorders.  Her appetite has been good, she has been drinking plenty of Gatorade.  Patient has no other complaints at this time.  Patient does not smoke, does not drink, does not use illicit drugs, is vaccinated for COVID.  Patient is full  code.  In the ED Temp 98, heart rate 78-91, respiratory rate 16-20, blood pressure 154/62, satting at 98% Troponin normal at 4 and 5 UA is not indicative of UTI CT head shows no acute findings EKG shows a heart rate of 87, sinus rhythm, QTC 493 Mild leukocytosis of 11.8, hemoglobin 11.9 Telemetry neuro was initially supposed to consult, but they are too backed up so the plan is for inpatient consult in the a.m.    Review of systems:    In addition to the HPI above,  No Fever-chills, No Headache, No changes with Vision or hearing, No problems swallowing food or Liquids, No Chest pain, Cough or Shortness of Breath, No Abdominal pain, No Nausea or Vomiting, bowel movements are regular, No Blood in stool or Urine, No dysuria, No new skin rashes or bruises, No new joints pains-aches,  No recent weight gain or loss, No polyuria, polydypsia or polyphagia, No significant Mental Stressors.  All other systems reviewed and are negative.    Past History of the following :    Past Medical History:  Diagnosis Date  . Asthma   . Chronic low back pain   . COPD (chronic obstructive pulmonary disease) ([place])   . Depression   . GERD (gastroesophageal reflux disease)   . Heart murmur   . Lobular carcinoma of left breast ([place]) 1997   in situ  . Other and unspecified hyperlipidemia   . Seasonal allergies       Past Surgical History:  Procedure Laterality Date  . BIOPSY  12/03/2020   Procedure: BIOPSY;  Surgeon: Rogene Houston, MD;  [facility];  Service: Endoscopy;;  gastric polyps biopsies;  . BREAST SURGERY Left    lumpectomy   . CESAREAN SECTION  1986  . CHOLECYSTECTOMY    . COLONOSCOPY WITH PROPOFOL N/A 12/03/2020    two 4-7 mm polyps in cecum, one small polyp in cecum, one 5 mm polyp, sigmoid diverticulosis, path with tubular adenomas  . ESOPHAGOGASTRODUODENOSCOPY (EGD) WITH PROPOFOL N/A 12/03/2020   normal esophagus, multiple gastric polyps s/p biopsy, small  paraesophageal hernia, normal duodenum. Fundic gland polyps.  . ESOPHAGOGASTRODUODENOSCOPY (EGD) WITH PROPOFOL N/A 02/05/2021   Procedure: ESOPHAGOGASTRODUODENOSCOPY (EGD) WITH PROPOFOL;  Surgeon: [name], MD;  [facility];  Service: Endoscopy;  Laterality: N/A;  . GIVENS CAPSULE STUDY N/A 12/16/2020   normal small bowel capsule but rapid transit time of 21 minutes. Recommended to resume alendronate.   Marland Kitchen GIVENS CAPSULE STUDY N/A 02/04/2021   Procedure: GIVENS CAPSULE STUDY;  Surgeon: Rogene Houston, MD;  [facility];  Service: Endoscopy;  Laterality: N/A;  . HOT HEMOSTASIS  02/05/2021   Procedure: HOT HEMOSTASIS (ARGON PLASMA COAGULATION/BICAP);  Surgeon: [name], MD;  [facility];  Service: Endoscopy;;  . TONSILLECTOMY     age 45      Social History:      Social History   Tobacco Use  . Smoking status: Former Smoker    Packs/day: 0.50    Years: 20.00    Pack years: 10.00    Types: Cigarettes    Start date: 09/16/1964    Quit date: 10/05/1983    Years since quitting: 37.3  . Smokeless tobacco: Never Used  Substance Use Topics  . Alcohol use: Never    Alcohol/week: 0.0 standard drinks       Family History :     Family History  Problem Relation Age of Onset  . Osteoporosis Mother   . Other Father        head trauma  . Breast cancer Sister        X's 2 sisters     Home Medications:   Prior to Admission medications   Medication Sig Start Date End Date Taking? Authorizing Provider  acetaminophen (TYLENOL) 500 MG tablet Take 500 mg by mouth every 6 (six) hours as needed for moderate pain or headache.    [provider]  ALPRAZolam Duanne Moron) 0.5 MG tablet Take 0.25 mg by mouth daily as needed for anxiety.    [provider]  fentaNYL (DURAGESIC - DOSED MCG/HR) 25 MCG/HR patch Place 25 mcg onto the skin See admin instructions. Place 25 mcg onto the skin for 72 hours then remove as needed for pain    [provider]  ferrous sulfate 325 (65 FE) MG tablet Take 1 tablet (325 mg total) by mouth daily with breakfast. 02/08/21  [name], [name], MD  HYDROcodone-acetaminophen (NORCO/VICODIN) 5-325 MG per tablet Take 1 tablet by mouth 2 (two) times daily as needed for moderate pain.     [provider]  montelukast (SINGULAIR) 10 MG tablet Take 10 mg by mouth at bedtime.    [provider]  Multiple Vitamins-Minerals (PRESERVISION AREDS 2) CAPS Take 1 capsule by mouth in the morning and at bedtime.    [provider]  pantoprazole (PROTONIX) 40 MG tablet Take 1 tablet (40 mg total) by mouth 2 (two) times daily. 02/07/21 05/08/21  Heath Lark D, DO  Pediatric Multivitamins-[facility] COMPLETE) 18 MG CHEW Chew 1 tablet by mouth 2 (two) times daily. 01/27/21   Rogene Houston, MD  sertraline (ZOLOFT) 50 MG tablet Take 25 mg by mouth daily. 01/26/16   [provider]  simvastatin (ZOCOR) 20 MG tablet Take 20 mg by mouth daily.    [provider]  tiZANidine (ZANAFLEX) 4 MG tablet Take 2 mg by mouth at bedtime as needed for muscle spasms.    [provider]  Donnal Debar 200-62.5-25 MCG/INH AEPB Inhale 1 puff into the lungs daily. 12/21/20   [provider]     Allergies:     Allergies  Allergen Reactions  . Aspirin     samter's syndrome  . Codeine Itching  . Sulfa Antibiotics     Unknown reaction  . Other Rash    gel filled fentanyl patch, adhesive on that patch caused ithcing     Physical Exam:   Vitals  Blood pressure 132/81, pulse 75, temperature 97.9 F (36.6 C), temperature source Oral, resp. rate 14, height 4\' 10"  (1.473 m), weight 62.6 kg, SpO2 98 %.  1.  General: Patient lying supine in bed in no acute distress  2. Psychiatric: Mood and behavior normal for situation, memory intact, alert and oriented x3  3. Neurologic: Speech and language are normal, face is symmetric, moves all 4 extremities  voluntarily-however there is weakness of the bilateral lower extremities, worse on the right than the left, sensation intact, no focal deficits on limited exam  4. HEENMT:  Head is atraumatic, normocephalic, pupils are reactive to light, neck is supple, trachea is midline, mucous membranes are moist  5. Respiratory : Lungs are clear to auscultation bilaterally without wheezes, rhonchi, rales, no cyanosis, no clubbing  6. Cardiovascular : Heart rate is normal, rhythm is regular, heart murmur present, no rubs or gallops, no peripheral edema present  7. Gastrointestinal:  Abdomen is soft, nondistended, nontender to palpation, bowel sounds normal  8. Skin:  Skin is warm dry and intact without acute lesion on limited exam  9.Musculoskeletal:  No Pitting edema present, no calf tenderness, no acute deformity    Data Review:    CBC Recent Labs  Lab 02/16/21 1421  WBC 11.8*  HGB 11.9*  HCT 40.0  PLT 406*  MCV 92.2  MCH 27.4  MCHC 29.8*  RDW 15.2  LYMPHSABS 2.3  MONOABS 1.3*  EOSABS 0.4  BASOSABS 0.1   ------------------------------------------------------------------------------------------------------------------  Results for orders placed or performed during the hospital encounter of 02/16/21 (from the past 48 hour(s))  CBC with Differential     Status: Abnormal   Collection Time: 02/16/21  2:21 PM  Result Value Ref Range   WBC 11.8 (H) 4.0 - 10.5 K/uL   RBC 4.34 3.87 - 5.11 MIL/uL   Hemoglobin 11.9 (L) 12.0 - 15.0 g/dL   HCT 40.0 36.0 - 46.0 %   MCV 92.2  80.0 - 100.0 fL   MCH 27.4 26.0 - 34.0 pg   MCHC 29.8 (L) 30.0 - 36.0 g/dL   RDW 15.2 11.5 - 15.5 %   Platelets 406 (H) 150 - 400 K/uL   nRBC 0.0 0.0 - 0.2 %   Neutrophils Relative % 64 %   Neutro Abs 7.7 1.7 - 7.7 K/uL   Lymphocytes Relative 20 %   Lymphs Abs 2.3 0.7 - 4.0 K/uL   Monocytes Relative 11 %   Monocytes Absolute 1.3 (H) 0.1 - 1.0 K/uL   Eosinophils Relative 3 %   Eosinophils Absolute 0.4 0.0 - 0.5  K/uL   Basophils Relative 1 %   Basophils Absolute 0.1 0.0 - 0.1 K/uL   Immature Granulocytes 1 %   Abs Immature Granulocytes 0.08 (H) 0.00 - 0.07 K/uL    Comment: Performed at [facility], [address], [place], [place] [address]  Basic metabolic panel     Status: Abnormal   Collection Time: 02/16/21  2:21 PM  Result Value Ref Range   Sodium 138 135 - 145 mmol/L   Potassium 4.5 3.5 - 5.1 mmol/L   Chloride 101 98 - 111 mmol/L   CO2 26 22 - 32 mmol/L   Glucose, Bld 117 (H) 70 - 99 mg/dL    Comment: Glucose reference range applies only to samples taken after fasting for at least 8 hours.   BUN 14 8 - 23 mg/dL   Creatinine, Ser 0.59 0.44 - 1.00 mg/dL   Calcium 10.0 8.9 - 10.3 mg/dL   GFR, Estimated >60 >60 mL/min    Comment: (NOTE) Calculated using the CKD-EPI Creatinine Equation (2021)    Anion gap 11 5 - 15    Comment: Performed at [facility], [address], [place], [place] [address]  Urinalysis, Routine w reflex microscopic Urine, Clean Catch     Status: Abnormal   Collection Time: 02/16/21  4:00 PM  Result Value Ref Range   Color, Urine STRAW (A) YELLOW   APPearance CLEAR CLEAR   Specific Gravity, Urine 1.008 1.005 - 1.030   pH 5.0 5.0 - 8.0   Glucose, UA NEGATIVE NEGATIVE mg/dL   Hgb urine dipstick NEGATIVE NEGATIVE   Bilirubin Urine NEGATIVE NEGATIVE   Ketones, ur NEGATIVE NEGATIVE mg/dL   Protein, ur NEGATIVE NEGATIVE mg/dL   Nitrite NEGATIVE NEGATIVE   Leukocytes,Ua NEGATIVE NEGATIVE    Comment: Performed at [facility], [address], [place], [place] [address]  Troponin I (High Sensitivity)     Status: None   Collection Time: 02/16/21  5:52 PM  Result Value Ref Range   Troponin I (High Sensitivity) 4 <18 ng/L    Comment: (NOTE) Elevated high sensitivity troponin I (hsTnI) values and significant  changes across serial measurements may suggest ACS but many other  chronic and acute conditions are known to elevate hsTnI results.  Refer to the "Links" section  for chest pain algorithms and additional  guidance. Performed at [facility], [address] [place], [place] [address]   Troponin I (High Sensitivity)     Status: None   Collection Time: 02/16/21  7:16 PM  Result Value Ref Range   Troponin I (High Sensitivity) 5 <18 ng/L    Comment: (NOTE) Elevated high sensitivity troponin I (hsTnI) values and significant  changes across serial measurements may suggest ACS but many other  chronic and acute conditions are known to elevate hsTnI results.  Refer to the "Links" section for chest pain algorithms and additional  guidance. Performed at [facility], [address] [place], [place] [address]   Resp Panel by RT-PCR (Flu A&B, Covid) Nasopharyngeal Swab     Status: None   Collection Time: 02/16/21  8:11 PM   Specimen: Nasopharyngeal Swab; Nasopharyngeal(NP) swabs in vial transport medium  Result Value Ref Range   SARS Coronavirus 2 by RT PCR NEGATIVE NEGATIVE    Comment: (NOTE) SARS-CoV-2 target nucleic acids are NOT DETECTED.  The SARS-CoV-2 RNA is generally detectable in upper respiratory specimens during the acute phase of infection. The lowest concentration of SARS-CoV-2 viral copies this assay can detect is 138 copies/mL. A negative result does not preclude SARS-Cov-2 infection and should not be used as the sole basis for treatment or other patient management decisions. A negative result may occur with  improper specimen collection/handling, submission of specimen other than nasopharyngeal swab, presence of viral mutation(s) within the areas targeted by this assay, and inadequate number of viral copies(<138 copies/mL). A negative result must be combined with clinical observations, patient history, and epidemiological information. The expected result is Negative.  Fact Sheet for Patients:  EntrepreneurPulse.com.au  Fact Sheet for Healthcare Providers:  IncredibleEmployment.be  This test is no  t yet approved or cleared by the [place] FDA and  has been authorized for detection and/or diagnosis of SARS-CoV-2 by FDA under an Emergency Use Authorization (EUA). This EUA will remain  in effect (meaning this test can be used) for the duration of the COVID-19 declaration under Section 564(b)(1) of the Act, 21 U.S.C.section 360bbb-3(b)(1), unless the authorization is terminated  or revoked sooner.       Influenza A by PCR NEGATIVE NEGATIVE   Influenza B by PCR NEGATIVE NEGATIVE    Comment: (NOTE) The Xpert Xpress SARS-CoV-2/FLU/RSV plus assay is intended as an aid in the diagnosis of influenza from Nasopharyngeal swab specimens and should not be used as a sole basis for treatment. Nasal washings and aspirates are unacceptable for Xpert Xpress SARS-CoV-2/FLU/RSV testing.  Fact Sheet for Patients: EntrepreneurPulse.com.au  Fact Sheet for Healthcare Providers: IncredibleEmployment.be  This test is not yet approved or cleared by the [place] FDA and has been authorized for detection and/or diagnosis of SARS-CoV-2 by FDA under an Emergency Use Authorization (EUA). This EUA will remain in effect (meaning this test can be used) for the duration of the COVID-19 declaration under Section 564(b)(1) of the Act, 21 U.S.C. section 360bbb-3(b)(1), unless the authorization is terminated or revoked.  Performed at [facility], [address] [place], [place] [address]     Chemistries  Recent Labs  Lab 02/16/21 1421  NA 138  K 4.5  CL 101  CO2 26  GLUCOSE 117*  BUN 14  CREATININE 0.59  CALCIUM 10.0   ------------------------------------------------------------------------------------------------------------------  ------------------------------------------------------------------------------------------------------------------ GFR: Estimated Creatinine Clearance: 49.8 mL/min (by C-G formula based on SCr of 0.59 mg/dL). Liver  Function Tests: No results for input(s): AST, ALT, ALKPHOS, BILITOT, PROT, ALBUMIN in the last 168 hours. No results for input(s): LIPASE, AMYLASE in the last 168 hours. No results for input(s): AMMONIA in the last 168 hours. Coagulation Profile: No results for input(s): INR, PROTIME in the last 168 hours. Cardiac Enzymes: No results for input(s): CKTOTAL, CKMB, CKMBINDEX, TROPONINI in the last 168 hours. BNP (last 3 results) No results for input(s): PROBNP in the last 8760 hours. HbA1C: No results for input(s): HGBA1C in the last 72 hours. CBG: No results for input(s): GLUCAP in the last 168 hours. Lipid Profile: No results for input(s): CHOL, HDL,  LDLCALC, TRIG, CHOLHDL, LDLDIRECT in the last 72 hours. Thyroid Function Tests: No results for input(s): TSH, T4TOTAL, FREET4, T3FREE, THYROIDAB in the last 72 hours. Anemia Panel: No results for input(s): VITAMINB12, FOLATE, FERRITIN, TIBC, IRON, RETICCTPCT in the last 72 hours.  --------------------------------------------------------------------------------------------------------------- Urine analysis:    Component Value Date/Time   COLORURINE STRAW (A) 02/16/2021 1600   APPEARANCEUR CLEAR 02/16/2021 1600   LABSPEC 1.008 02/16/2021 1600   PHURINE 5.0 02/16/2021 1600   GLUCOSEU NEGATIVE 02/16/2021 1600   HGBUR NEGATIVE 02/16/2021 1600   BILIRUBINUR NEGATIVE 02/16/2021 1600   KETONESUR NEGATIVE 02/16/2021 1600   PROTEINUR NEGATIVE 02/16/2021 1600   NITRITE NEGATIVE 02/16/2021 1600   LEUKOCYTESUR NEGATIVE 02/16/2021 1600      Imaging Results:    CT Head Wo Contrast  Result Date: 02/16/2021 CLINICAL DATA:  Rule out stroke.  Bilateral leg weakness for 2 weeks EXAM: CT HEAD WITHOUT CONTRAST TECHNIQUE: Contiguous axial images were obtained from the base of the skull through the vertex without intravenous contrast. COMPARISON:  None. FINDINGS: Brain: No evidence of acute infarction, hemorrhage, hydrocephalus, extra-axial collection  or mass lesion/mass effect. Vascular: No hyperdense vessel or unexpected calcification. Skull: Normal. Negative for fracture or focal lesion. Sinuses/Orbits: Status post bilateral maxillary sinus median antrectomies. Moderate diffuse mucosal thickening noted involving the maxillary sinuses, sphenoid sinuses and ethmoid air cells. Mastoid air cells appear clear. Other: None. IMPRESSION: 1. No acute intracranial abnormalities. 2. Chronic sinus inflammation. Electronically Signed   By: Kerby Moors M.D.   On: 02/16/2021 16:44       Assessment & Plan:    Active Problems:   Lower extremity weakness   1. Lower extremity weakness 1. Bilateral lower extremity weakness worse on right than left 2. CT head without acute changes 3. Patient will likely need an MRI of her brain, and an MRI of her lumbar spine, holding off on ordering these until neuro has been able to see patient in case they would like more imaging as well 4. PT consulted 5. Continue to monitor 2. Ambulatory dysfunction 1. PT consult, appreciate recommendations 3. Back pain 1. Control with pain scale 4.    DVT Prophylaxis-   Heparin- SCDs   AM Labs Ordered, also please review Full Orders  Family Communication: No family at bedside Code Status: Full Admission status: Observation Time spent in minutes :[place]

## 2021-02-18 ENCOUNTER — Observation Stay (HOSPITAL_COMMUNITY): Payer: Medicare Other

## 2021-02-18 ENCOUNTER — Encounter (HOSPITAL_COMMUNITY): Payer: Self-pay | Admitting: Family Medicine

## 2021-02-18 DIAGNOSIS — Z888 Allergy status to other drugs, medicaments and biological substances status: Secondary | ICD-10-CM | POA: Diagnosis not present

## 2021-02-18 DIAGNOSIS — F32A Depression, unspecified: Secondary | ICD-10-CM | POA: Diagnosis present

## 2021-02-18 DIAGNOSIS — D329 Benign neoplasm of meninges, unspecified: Secondary | ICD-10-CM | POA: Diagnosis present

## 2021-02-18 DIAGNOSIS — K219 Gastro-esophageal reflux disease without esophagitis: Secondary | ICD-10-CM | POA: Diagnosis present

## 2021-02-18 DIAGNOSIS — Z20822 Contact with and (suspected) exposure to covid-19: Secondary | ICD-10-CM | POA: Diagnosis present

## 2021-02-18 DIAGNOSIS — M545 Low back pain, unspecified: Secondary | ICD-10-CM | POA: Diagnosis present

## 2021-02-18 DIAGNOSIS — J302 Other seasonal allergic rhinitis: Secondary | ICD-10-CM | POA: Diagnosis present

## 2021-02-18 DIAGNOSIS — G610 Guillain-Barre syndrome: Secondary | ICD-10-CM | POA: Diagnosis present

## 2021-02-18 DIAGNOSIS — Z803 Family history of malignant neoplasm of breast: Secondary | ICD-10-CM | POA: Diagnosis not present

## 2021-02-18 DIAGNOSIS — D649 Anemia, unspecified: Secondary | ICD-10-CM | POA: Diagnosis present

## 2021-02-18 DIAGNOSIS — R279 Unspecified lack of coordination: Secondary | ICD-10-CM | POA: Diagnosis not present

## 2021-02-18 DIAGNOSIS — Z7951 Long term (current) use of inhaled steroids: Secondary | ICD-10-CM | POA: Diagnosis not present

## 2021-02-18 DIAGNOSIS — J449 Chronic obstructive pulmonary disease, unspecified: Secondary | ICD-10-CM | POA: Diagnosis present

## 2021-02-18 DIAGNOSIS — D8681 Sarcoid meningitis: Secondary | ICD-10-CM | POA: Diagnosis not present

## 2021-02-18 DIAGNOSIS — Z7401 Bed confinement status: Secondary | ICD-10-CM | POA: Diagnosis not present

## 2021-02-18 DIAGNOSIS — E538 Deficiency of other specified B group vitamins: Secondary | ICD-10-CM | POA: Diagnosis present

## 2021-02-18 DIAGNOSIS — Z87891 Personal history of nicotine dependence: Secondary | ICD-10-CM | POA: Diagnosis not present

## 2021-02-18 DIAGNOSIS — Z91048 Other nonmedicinal substance allergy status: Secondary | ICD-10-CM | POA: Diagnosis not present

## 2021-02-18 DIAGNOSIS — E785 Hyperlipidemia, unspecified: Secondary | ICD-10-CM | POA: Diagnosis present

## 2021-02-18 DIAGNOSIS — Z885 Allergy status to narcotic agent status: Secondary | ICD-10-CM | POA: Diagnosis not present

## 2021-02-18 DIAGNOSIS — M6281 Muscle weakness (generalized): Secondary | ICD-10-CM | POA: Diagnosis not present

## 2021-02-18 DIAGNOSIS — Z79899 Other long term (current) drug therapy: Secondary | ICD-10-CM | POA: Diagnosis not present

## 2021-02-18 DIAGNOSIS — R29898 Other symptoms and signs involving the musculoskeletal system: Secondary | ICD-10-CM | POA: Diagnosis not present

## 2021-02-18 DIAGNOSIS — Z882 Allergy status to sulfonamides status: Secondary | ICD-10-CM | POA: Diagnosis not present

## 2021-02-18 DIAGNOSIS — R2689 Other abnormalities of gait and mobility: Secondary | ICD-10-CM | POA: Diagnosis not present

## 2021-02-18 DIAGNOSIS — R41841 Cognitive communication deficit: Secondary | ICD-10-CM | POA: Diagnosis not present

## 2021-02-18 DIAGNOSIS — Z86000 Personal history of in-situ neoplasm of breast: Secondary | ICD-10-CM | POA: Diagnosis not present

## 2021-02-18 DIAGNOSIS — G8929 Other chronic pain: Secondary | ICD-10-CM | POA: Diagnosis present

## 2021-02-18 DIAGNOSIS — R5381 Other malaise: Secondary | ICD-10-CM | POA: Diagnosis not present

## 2021-02-18 LAB — CSF CELL COUNT WITH DIFFERENTIAL
RBC Count, CSF: 4 /mm3 — ABNORMAL HIGH
Tube #: 1
WBC, CSF: 2 /mm3 (ref 0–5)

## 2021-02-18 LAB — CBC
HCT: 37 % (ref 36.0–46.0)
Hemoglobin: 11 g/dL — ABNORMAL LOW (ref 12.0–15.0)
MCH: 27.6 pg (ref 26.0–34.0)
MCHC: 29.7 g/dL — ABNORMAL LOW (ref 30.0–36.0)
MCV: 92.7 fL (ref 80.0–100.0)
Platelets: 312 10*3/uL (ref 150–400)
RBC: 3.99 MIL/uL (ref 3.87–5.11)
RDW: 15.6 % — ABNORMAL HIGH (ref 11.5–15.5)
WBC: 8.9 10*3/uL (ref 4.0–10.5)
nRBC: 0 % (ref 0.0–0.2)

## 2021-02-18 LAB — BASIC METABOLIC PANEL
Anion gap: 9 (ref 5–15)
BUN: 14 mg/dL (ref 8–23)
CO2: 28 mmol/L (ref 22–32)
Calcium: 9 mg/dL (ref 8.9–10.3)
Chloride: 100 mmol/L (ref 98–111)
Creatinine, Ser: 0.38 mg/dL — ABNORMAL LOW (ref 0.44–1.00)
GFR, Estimated: >60 mL/min (ref >60)
Glucose, Bld: 99 mg/dL (ref 70–99)
Potassium: 4.3 mmol/L (ref 3.5–5.1)
Sodium: 137 mmol/L (ref 135–145)

## 2021-02-18 LAB — CK TOTAL AND CKMB (NOT AT ARMC)
CK, MB: 1.3 ng/mL (ref 0.5–5.0)
Relative Index: INVALID (ref 0.0–2.5)
Total CK: 45 U/L (ref 38–234)

## 2021-02-18 LAB — PROTEIN AND GLUCOSE, CSF
Glucose, CSF: 82 mg/dL — ABNORMAL HIGH (ref 40–70)
Total  Protein, CSF: 91 mg/dL — ABNORMAL HIGH (ref 15–45)

## 2021-02-18 LAB — CRYPTOCOCCAL ANTIGEN, CSF: Crypto Ag: NEGATIVE

## 2021-02-18 IMAGING — RF DG SPINAL PUNCT LUMBAR DIAG WITH FL CT GUIDANCE
2 series · 2 of 2 positions shown · non-contrast
Comparison: None.

CLINICAL DATA: Neurosarcoidosis.

EXAM:
DIAGNOSTIC LUMBAR PUNCTURE UNDER FLUOROSCOPIC GUIDANCE

[Series 1: fluoro_myelogram_singleshot_bw · 0.09mm/px · 1 of 1 slices shown]
[im 1/1]
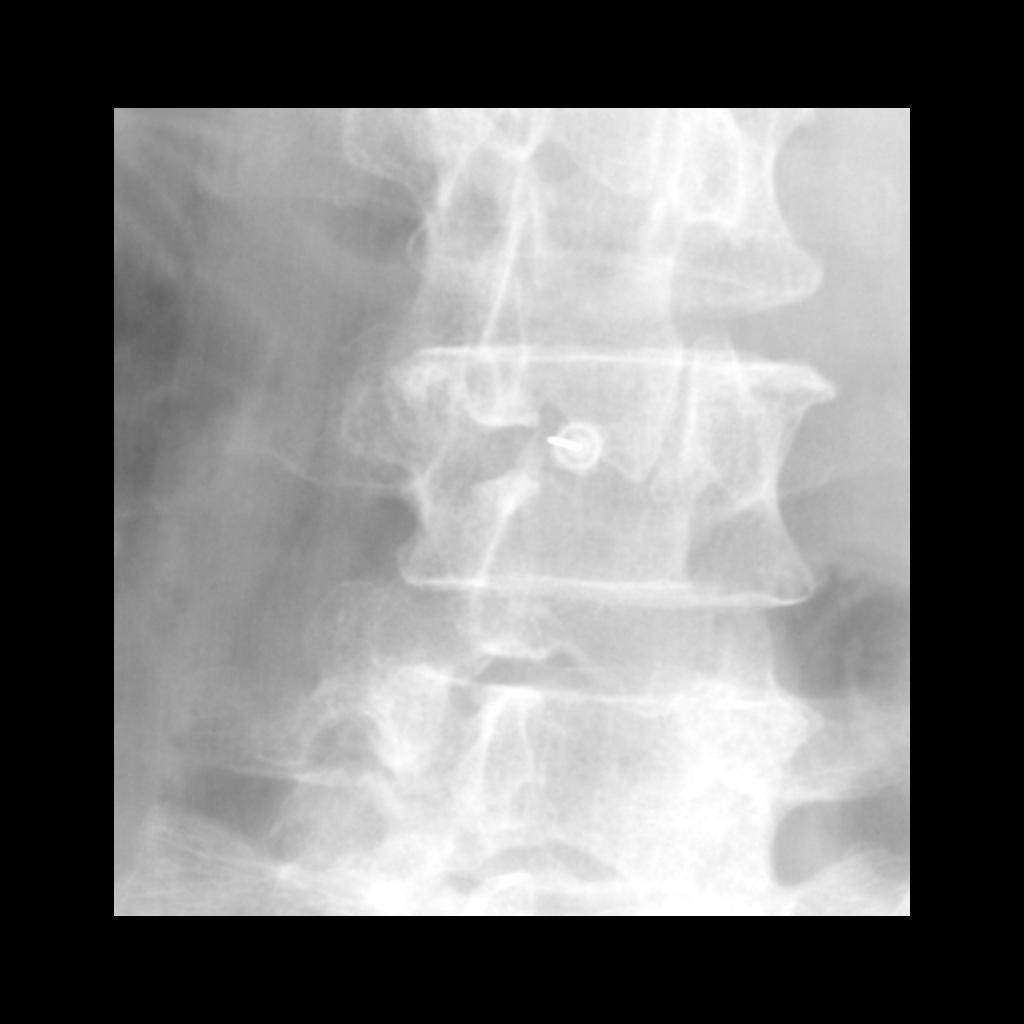

[Series 2: cp_standard · 0.09mm/px · 1 of 1 slices shown]
[im 1/1]
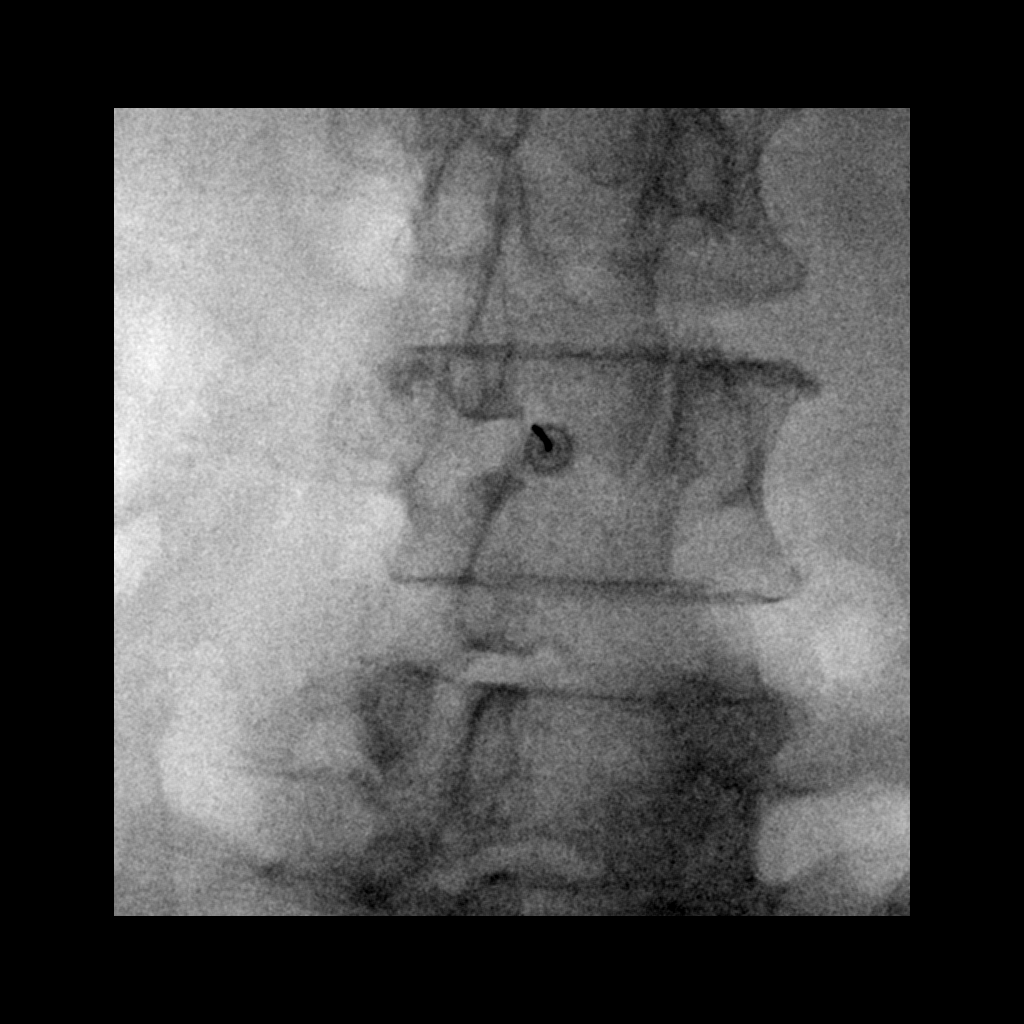

[2 of 2 positions shown; findings below may reference images not displayed]

FLUOROSCOPY TIME:  Radiation Exposure Index (if provided by the
fluoroscopic device): 22.7 mGy.

Number of Acquired Spot Images: 2.

PROCEDURE:
Informed consent was obtained from the patient prior to the
procedure, including potential complications of headache, allergy,
and pain. With the patient prone, the lower back was prepped with
Betadine. 1% Lidocaine was used for local anesthesia. Under
fluoroscopic guidance, lumbar puncture was performed at the L3-4
level using a 20 gauge needle with return of clear CSF. Ten ml of
CSF were obtained for laboratory studies. Needle was removed and
appropriate dressing was applied. The patient tolerated the
procedure well and there were no apparent complications.
IMPRESSION: Under fluoroscopic guidance, successful lumbar puncture was
performed for diagnostic purposes.

## 2021-02-18 MED ORDER — POVIDONE-IODINE 10 % EX SOLN
CUTANEOUS | Status: AC
  Administered 2021-02-18: 1
  Filled 2021-02-18: qty 15

## 2021-02-18 MED ORDER — OXYCODONE HCL 5 MG PO TABS
5.0000 mg | ORAL_TABLET | Freq: Four times a day (QID) | ORAL | Status: DC | PRN
  Administered 2021-02-18 – 2021-02-22 (×11): 5 mg via ORAL
  Filled 2021-02-18 (×11): qty 1

## 2021-02-18 MED ORDER — HYDROCODONE-ACETAMINOPHEN 5-325 MG PO TABS
1.0000 | ORAL_TABLET | Freq: Four times a day (QID) | ORAL | Status: DC | PRN
  Administered 2021-02-18 (×2): 1 via ORAL
  Filled 2021-02-18 (×2): qty 1

## 2021-02-18 NOTE — Progress Notes (Signed)
PT Cancellation Note  Patient Details Name: Lunah Losasso MRN: [id number] DOB: 20-May-1946   Cancelled Treatment:    Reason Eval/Treat Not Completed: Medical issues which prohibited therapy.  Patient s/p procedure requiring 24 hours bed rest, will check back tomorrow.   12:33 PM, 02/18/21 Lonell Grandchild, MPT Physical Therapist with [facility] [phone] office [phone] mobile phone

## 2021-02-18 NOTE — TOC Progression Note (Signed)
Transition of Care [facility]) - Progression Note    Patient Details  Name: Alexa Nichols MRN: [id number] Date of Birth: [birth year]  [facility] CM/SW Contact  Boneta Lucks, RN Phone Number: 02/18/2021, 2:52 PM  Clinical Narrative:   With new diagnosis Guillain-Barr syndrome, TOC discussing SNF with patient. She is agreeable. She is Vaccinated for [place]. Patient is requesting [facility] as first choice, [facility] in [place] as second. PASSR started, pending, uploading documents.   Expected Discharge Plan: [place] Barriers to Discharge: Continued Medical Work up  Expected Discharge Plan and Services Expected Discharge Plan: [place] arrangements for the past 2 months: [place]: PT   Date [place]: 02/17/21 Time [place]: 1113 Representative spoke with at [place]: Spoke to [employer] in [place], will fax orders.  Readmission Risk Interventions Readmission Risk Prevention Plan 02/18/2021  Medication Screening Complete  Transportation Screening Complete  Some recent data might be hidden

## 2021-02-18 NOTE — Progress Notes (Signed)
PROGRESS NOTE  Alexa Nichols TDD:[id number] DOB: May 20, 1946 DOA: 02/16/2021 PCP: [name], MD  Brief History:  75 y.o. female, with history of seasonal duties, lobular carcinoma of left breast, GERD, COPD, and more presents to ED with a chief complaint of leg weakness.  Patient reports that she was hospitalized 2 weeks ago for GI bleed and was transfused a couple units.  Since she left the hospital she has had pain in her lower back that she attributed to the awkward bed.  She stated her back pain has been chronic for which she takes hydrocodone.  She stated that her back pain was really not much worse than usual.  However, she began noting progressive bilateral leg weakness starting 02/12/21.  She saw her PCP on 02/13/21 who ordered PT, but her leg weakness continued to progress to the point where she was not able to bear weight.  She denied any f/c, cp, sob, n/v/d or radicular symptoms.  Because of progressive leg weakness, she presented for further evaluation.   In the ED Temp 98, heart rate 78-91, respiratory rate 16-20, blood pressure 154/62, satting at 98% Troponin normal at 4 and 5 UA is not indicative of UTI CT head shows no acute findings EKG shows a heart rate of 87, sinus rhythm, QTC 493 Mild leukocytosis of 11.8, hemoglobin 11.9 Telemetry neuro was initially supposed to consult, but they are too backed up so the plan is for inpatient consult in the a.m.  Assessment/Plan: Acute Inflammatory Demyelinating Polyneuropathy (AIDP) -IVIG started 5/17 evening -plan 5 days IVIG -02/18/21 CSF consistent with albumincytologic dissociation -PT eval -stable on RA without and sob -incentive spirometry  Chronic back pain -continue hydrocodone -MRI T +L spine without cord impingement -MRI brain neg for acute finding except incidental meningioma  COPD -stable on RA -continue Breo, singulair, incruse  GI bleed hx -due to gastric and duodenal AVMs -s/p endoscopy  02/05/21 -Hgb stable  Depression -continue sertaline  Hyperlipidemia -continue statin      Status is: Inpatient  Remains inpatient appropriate because:IV treatments appropriate due to intensity of illness or inability to take PO   Dispo: The patient is from: Home              Anticipated d/c is to: Home              Patient currently is not medically stable to d/c.   Difficult to place patient No        Family Communication:   No Family at bedside  Consultants:  neurology  Code Status:  FULL   DVT Prophylaxis:  [place] Heparin / S   Procedures: As Listed in Progress Note Above  Antibiotics: None       Subjective:  Legs are feeling a little stronger.  Denies f/c, cp, sob, cough, n/v/d, abd pain, headache. Objective: Vitals:   02/18/21 0821 02/18/21 1031 02/18/21 1119 02/18/21 1334  BP:  (!) 144/83 (!) 135/92 (!) 151/78  Pulse:  88 83 82  Resp:  18 17 17   Temp:  97.8 F (36.6 C) 98.2 F (36.8 C) 98.1 F (36.7 C)  TempSrc:  Oral Oral Oral  SpO2: 95% 95% 96% 97%  Weight:      Height:        Intake/Output Summary (Last 24 hours) at 02/18/2021 1516 Last data filed at 02/18/2021 0700 Gross per 24 hour  Intake 360 ml  Output 700 ml  Net -340 ml  Weight change:  Exam:   General:  Pt is alert, follows commands appropriately, not in acute distress  HEENT: No icterus, No thrush, No neck mass, [place]/AT  Cardiovascular: RRR, S1/S2, no rubs, no gallops  Respiratory: bibasilar rales. No wheeze  Abdomen: Soft/+BS, non tender, non distended, no guarding  Extremities: No edema, No lymphangitis, No petechiae, No rashes, no synovitis  Neuro:  CN II-XII intact, strength 3/5 inRLE, strength 3/5 LLE; sensation intact bilateral; no dysmetria; babinski equivocal     Data Reviewed: I have personally reviewed following labs and imaging studies Basic Metabolic Panel: Recent Labs  Lab 02/16/21 1421 02/17/21 0614 02/18/21 0556  NA 138 139 137  K 4.5 4.3  4.3  CL 101 102 100  CO2 26 27 28   GLUCOSE 117* 104* 99  BUN 14 14 14   CREATININE 0.59 0.46 0.38*  CALCIUM 10.0 9.0 9.0   Liver Function Tests: Recent Labs  Lab 02/17/21 0614  AST 22  ALT 18  ALKPHOS 88  BILITOT 0.4  PROT 7.2  ALBUMIN 3.9   No results for input(s): LIPASE, AMYLASE in the last 168 hours. No results for input(s): AMMONIA in the last 168 hours. Coagulation Profile: No results for input(s): INR, PROTIME in the last 168 hours. CBC: Recent Labs  Lab 02/16/21 1421 02/17/21 0614 02/18/21 0556  WBC 11.8* 8.8 8.9  NEUTROABS 7.7  --   --   HGB 11.9* 11.0* 11.0*  HCT 40.0 36.2 37.0  MCV 92.2 90.7 92.7  PLT 406* 342 312   Cardiac Enzymes: Recent Labs  Lab 02/17/21 2026  CKTOTAL 45  CKMB 1.3   BNP: Invalid input(s): POCBNP CBG: No results for input(s): GLUCAP in the last 168 hours. HbA1C: Recent Labs    02/17/21 0614  HGBA1C 4.6*   Urine analysis:    Component Value Date/Time   COLORURINE STRAW (A) 02/16/2021 1600   APPEARANCEUR CLEAR 02/16/2021 1600   LABSPEC 1.008 02/16/2021 1600   PHURINE 5.0 02/16/2021 1600   GLUCOSEU NEGATIVE 02/16/2021 1600   HGBUR NEGATIVE 02/16/2021 1600   BILIRUBINUR NEGATIVE 02/16/2021 1600   KETONESUR NEGATIVE 02/16/2021 1600   PROTEINUR NEGATIVE 02/16/2021 1600   NITRITE NEGATIVE 02/16/2021 1600   LEUKOCYTESUR NEGATIVE 02/16/2021 1600   Sepsis Labs: @LABRCNTIP (procalcitonin:4,lacticidven:4) ) Recent Results (from the past 240 hour(s))  Resp Panel by RT-PCR (Flu A&B, Covid) Nasopharyngeal Swab     Status: None   Collection Time: 02/16/21  8:11 PM   Specimen: Nasopharyngeal Swab; Nasopharyngeal(NP) swabs in vial transport medium  Result Value Ref Range Status   SARS Coronavirus 2 by RT PCR NEGATIVE NEGATIVE Final    Comment: (NOTE) SARS-CoV-2 target nucleic acids are NOT DETECTED.  The SARS-CoV-2 RNA is generally detectable in upper respiratory specimens during the acute phase of infection. The  lowest concentration of SARS-CoV-2 viral copies this assay can detect is 138 copies/mL. A negative result does not preclude SARS-Cov-2 infection and should not be used as the sole basis for treatment or other patient management decisions. A negative result may occur with  improper specimen collection/handling, submission of specimen other than nasopharyngeal swab, presence of viral mutation(s) within the areas targeted by this assay, and inadequate number of viral copies(<138 copies/mL). A negative result must be combined with clinical observations, patient history, and epidemiological information. The expected result is Negative.  Fact Sheet for Patients:  EntrepreneurPulse.com.au  Fact Sheet for Healthcare Providers:  IncredibleEmployment.be  This test is no t yet approved or cleared by the [place] and  has been authorized for detection and/or diagnosis of SARS-CoV-2 by FDA under an Emergency Use Authorization (EUA). This EUA will remain  in effect (meaning this test can be used) for the duration of the COVID-19 declaration under Section 564(b)(1) of the Act, 21 U.S.C.section 360bbb-3(b)(1), unless the authorization is terminated  or revoked sooner.       Influenza A by PCR NEGATIVE NEGATIVE Final   Influenza B by PCR NEGATIVE NEGATIVE Final    Comment: (NOTE) The Xpert Xpress SARS-CoV-2/FLU/RSV plus assay is intended as an aid in the diagnosis of influenza from Nasopharyngeal swab specimens and should not be used as a sole basis for treatment. Nasal washings and aspirates are unacceptable for Xpert Xpress SARS-CoV-2/FLU/RSV testing.  Fact Sheet for Patients: EntrepreneurPulse.com.au  Fact Sheet for Healthcare Providers: IncredibleEmployment.be  This test is not yet approved or cleared by the [place] FDA and has been authorized for detection and/or diagnosis of SARS-CoV-2 by FDA under  an Emergency Use Authorization (EUA). This EUA will remain in effect (meaning this test can be used) for the duration of the COVID-19 declaration under Section 564(b)(1) of the Act, 21 U.S.C. section 360bbb-3(b)(1), unless the authorization is terminated or revoked.  Performed at [facility], [address] [place], [place] [address]   CSF culture w Stat Gram Stain     Status: None (Preliminary result)   Collection Time: 02/18/21 10:51 AM   Specimen: CSF; Cerebrospinal Fluid  Result Value Ref Range Status   Specimen Description CSF  Final   Special Requests NONE  Final   Gram Stain   Final    NO ORGANISMS SEEN CYTOSPIN SMEAR WBC PRESENT, PREDOMINANTLY MONONUCLEAR Performed at [facility], [address] [place], [place] [address]    Culture PENDING  Incomplete   Report Status PENDING  Incomplete     Scheduled Meds: .  stroke: mapping our early stages of recovery book   Does not apply Once  . ezetimibe  10 mg Oral Daily  . fluticasone furoate-vilanterol  1 puff Inhalation Daily  . gabapentin  100 mg Oral TID  . montelukast  10 mg Oral QHS  . pantoprazole  40 mg Oral BID  . sertraline  25 mg Oral Daily  . umeclidinium bromide  1 puff Inhalation Daily   Continuous Infusions: . IMMUNE GLOBULIN 10% (HUMAN) IV - For Fluid Restriction Only      Procedures/Studies: CT Head Wo Contrast  Result Date: 02/16/2021 CLINICAL DATA:  Rule out stroke.  Bilateral leg weakness for 2 weeks EXAM: CT HEAD WITHOUT CONTRAST TECHNIQUE: Contiguous axial images were obtained from the base of the skull through the vertex without intravenous contrast. COMPARISON:  None. FINDINGS: Brain: No evidence of acute infarction, hemorrhage, hydrocephalus, extra-axial collection or mass lesion/mass effect. Vascular: No hyperdense vessel or unexpected calcification. Skull: Normal. Negative for fracture or focal lesion. Sinuses/Orbits: Status post bilateral maxillary sinus median antrectomies. Moderate diffuse  mucosal thickening noted involving the maxillary sinuses, sphenoid sinuses and ethmoid air cells. Mastoid air cells appear clear. Other: None. IMPRESSION: 1. No acute intracranial abnormalities. 2. Chronic sinus inflammation. Electronically Signed   By: Kerby Moors M.D.   On: 02/16/2021 16:44   MR BRAIN WO CONTRAST  Result Date: 02/17/2021 CLINICAL DATA:  Neuro deficit, acute stroke suspected. Difficulty walking. EXAM: MRI HEAD WITHOUT CONTRAST TECHNIQUE: Multiplanar, multiecho pulse sequences of the brain and surrounding structures were obtained without intravenous contrast. COMPARISON:  CT head Feb 16 2021. FINDINGS: Brain: No acute infarction, hemorrhage, hydrocephalus, or  extra-axial fluid collection. Mild for age T2/FLAIR hyperintensities within the white matter, most likely related to chronic microvascular ischemic disease. Incidental 11 mm calcified extra-axial, dural-based lesion along the posterior right vertex (series 11, image 23), likely a meningioma. No mass effect or surrounding edema. Vascular: Major arterial flow voids are maintained at the skull base. Skull and upper cervical spine: Normal marrow signal. Sinuses/Orbits: Evidence of prior endoscopic sinus surgery with bilateral maxillary antrostomies and ethmoidectomies. Moderate pansinus mucosal thickening, greatest in the ethmoid air cells and sphenoid sinuses. Air-fluid level in the right sphenoid sinus. Other: Small right mastoid effusion. IMPRESSION: 1. No evidence of acute intracranial abnormality. Specifically, no acute infarct. 2. Incidental presumed 11 mm meningioma along the vertex without mass effect or surrounding edema. 3. Evidence of prior endoscopic sinus surgery with moderate paranasal sinus mucosal thickening, as detailed above. Electronically Signed   By: Feliberto HartsFrederick S Jones MD   On: 02/17/2021 09:58   MR THORACIC SPINE WO CONTRAST  Result Date: 02/17/2021 CLINICAL DATA:  Lumbar radiculopathy. EXAM: MRI THORACIC SPINE  WITHOUT CONTRAST TECHNIQUE: Multiplanar, multisequence MR imaging of the thoracic spine was performed. No intravenous contrast was administered. COMPARISON:  None. FINDINGS: Alignment:  Exaggerated thoracic kyphosis. Vertebrae: No fracture, evidence of discitis, or bone lesion. Anterior vertebral body/disc space ankylosis from T3 through T5 and at T6-7. Cord: Normal cord signal. Small indentation on the anterior cord surface at the C6-7 and T8-9 Paraspinal and other soft tissues: Negative. Disc levels: Small posterior disc protrude at C6-7, C7-T1 and T1-2 causing small indentation on the thecal sac without significant spinal canal or neural foraminal stenosis. Small posterior disc protrusions at T6-7 and T8-9 causing small indentations on the anterior cord surface without cord signal abnormality, significant spinal canal stenosis or neural foraminal stenosis. No significant spinal canal or neural foraminal stenosis at the remaining thoracic levels. Right perineural cysts at T6-7 and T10-11 IMPRESSION: Exaggerated thoracic kyphosis and mild degenerative changes without high-grade spinal canal or neural foraminal stenosis at any level. Electronically Signed   By: Baldemar LenisKatyucia  De Macedo Rodrigues M.D.   On: 02/17/2021 17:09   MR LUMBAR SPINE WO CONTRAST  Result Date: 02/17/2021 CLINICAL DATA:  Lumbar radiculopathy, greater than 6 weeks. EXAM: MRI LUMBAR SPINE WITHOUT CONTRAST TECHNIQUE: Multiplanar, multisequence MR imaging of the lumbar spine was performed. No intravenous contrast was administered. COMPARISON:  CT abdomen/pelvis 02/04/2021. FINDINGS: Segmentation: 5 lumbar vertebrae. The caudal most well-formed intervertebral disc space is designated L5-S1 Alignment: Trace L1-L2 grade 1 retrolisthesis. Trace L3-L4 grade 1 retrolisthesis. Trace L5-S1 grade 1 anterolisthesis. Vertebrae: Vertebral body height is maintained. No significant marrow edema or focal suspicious osseous lesion. L2 vertebral body hemangioma.  Conus medullaris and cauda equina: Conus extends to the L2 level. No signal abnormality within the visualized distal spinal cord. Paraspinal and other soft tissues: Tiny T2 hyperintense lesion within the right kidney, too small to characterize but likely reflecting a cyst. Paraspinal soft tissues within normal limits. Disc levels: Mild disc degeneration at the L2-L3 through L5-S1 levels, greatest at L3-L4 and L5-S1. T12-L1: Imaged sagittally. Shallow disc bulge. No significant spinal canal or foraminal stenosis. L1-L2: No significant disc herniation or stenosis. L2-L3: Minimal disc bulge. No significant spinal canal or foraminal stenosis. L3-L4: Disc bulge. Superimposed shallow broad-based right subarticular to right foraminal disc protrusion. Mild relative right subarticular narrowing without frank nerve root impingement. Central canal patent. Minimal relative right neural foraminal narrowing without nerve root impingement. L4-L5: Minimal disc bulge. Minimal facet arthrosis. Mild ligamentum flavum hypertrophy on  the right. No significant spinal canal or foraminal stenosis. L5-S1: Disc bulge. Mild endplate spurring. Facet arthrosis/ligamentum flavum hypertrophy. Bilateral subarticular narrowing (mild right, mild/moderate left) with some crowding of the left greater than right descending S1 nerve roots (series 19, image 25). Central canal patent. Mild relative bilateral neural foraminal narrowing. IMPRESSION: Lumbar spondylosis, as outlined and with findings most notably as follows. At L5-S1, there is trace grade 1 anterolisthesis. Mild disc degeneration. Disc bulge. Mild endplate spurring. Facet arthrosis/ligamentum flavum hypertrophy. Bilateral subarticular narrowing (mild right, mild/moderate left) with some crowding of the left greater than right descending S1 nerve roots. Mild relative bilateral neural foraminal narrowing. At L3-L4, there is trace retrolisthesis. Mild disc degeneration. Superimposed shallow  broad-based right subarticular to right foraminal disc protrusion. Mild right subarticular narrowing without frank nerve root impingement. Minimal relative right neural foraminal narrowing without nerve root impingement. No significant spinal canal or neural foraminal stenosis at the remaining levels. Electronically Signed   By: Kellie Simmering DO   On: 02/17/2021 17:01   DG FL GUIDED LUMBAR PUNCTURE  Result Date: 02/18/2021 CLINICAL DATA:  Neurosarcoidosis. EXAM: DIAGNOSTIC LUMBAR PUNCTURE UNDER FLUOROSCOPIC GUIDANCE COMPARISON:  None. FLUOROSCOPY TIME:  Radiation Exposure Index (if provided by the fluoroscopic device): 22.7 mGy. Number of Acquired Spot Images: 2. PROCEDURE: Informed consent was obtained from the patient prior to the procedure, including potential complications of headache, allergy, and pain. With the patient prone, the lower back was prepped with Betadine. 1% Lidocaine was used for local anesthesia. Under fluoroscopic guidance, lumbar puncture was performed at the L3-4 level using a 20 gauge needle with return of clear CSF. Ten ml of CSF were obtained for laboratory studies. Needle was removed and appropriate dressing was applied. The patient tolerated the procedure well and there were no apparent complications. IMPRESSION: Under fluoroscopic guidance, successful lumbar puncture was performed for diagnostic purposes. Electronically Signed   By: Marijo Conception M.D.   On: 02/18/2021 11:41   CT Angio Abd/Pel w/ and/or w/o  Result Date: 02/04/2021 CLINICAL DATA:  Obscure GI bleed, transfusion dependent anemia. EXAM: CTA ABDOMEN AND PELVIS WITHOUT AND WITH CONTRAST TECHNIQUE: Multidetector CT imaging of the abdomen and pelvis was performed using the standard protocol during bolus administration of intravenous contrast. Multiplanar reconstructed images and MIPs were obtained and reviewed to evaluate the vascular anatomy. CONTRAST:  121mL OMNIPAQUE IOHEXOL 350 MG/ML SOLN COMPARISON:  12/07/2015  FINDINGS: VASCULAR Aorta: Mild calcified atheromatous plaque. No aneurysm, dissection, or stenosis. Celiac: Patent.  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 SMA: Patent without evidence of aneurysm, dissection, vasculitis or significant stenosis. Renals: Both renal arteries are patent without evidence of aneurysm, dissection, vasculitis, fibromuscular dysplasia or significant stenosis. IMA: Patent without evidence of aneurysm, dissection, vasculitis or significant stenosis. Inflow: Patent without evidence of aneurysm, dissection, vasculitis or significant stenosis. Proximal Outflow: Bilateral common femoral and visualized portions of the superficial and profunda femoral arteries are patent without evidence of aneurysm, dissection, vasculitis or significant stenosis. Veins: Patent hepatic veins, portal vein, SMV, splenic vein, bilateral renal veins, iliac venous system and IVC. No venous pathology identified. Review of the MIP images confirms the above findings. NON-VASCULAR Lower chest: No pleural or pericardial effusion. Minimal linear scarring in the lung bases. Hepatobiliary: No focal liver abnormality is seen. Status post cholecystectomy. No biliary dilatation. Pancreas: Unremarkable. No pancreatic ductal dilatation or surrounding inflammatory changes. Spleen: Normal in size without focal abnormality. Adrenals/Urinary Tract: Adrenal glands unremarkable. Normal renal enhancement. No hydronephrosis. Urinary bladder physiologically distended. Stomach/Bowel:  Stomach is nondistended. 6.1 cm posterior gastric diverticulum (previously 4.9) containing fluid level and hyperdense material. No associated wall thickening nor inflammatory change. The small bowel is nondilated. Normal appendix. The colon is nondilated, unremarkable. No evidence of active extravasation. Lymphatic: No abdominal or pelvic adenopathy. Reproductive: Uterus and bilateral adnexa are unremarkable. Other: No ascites.  No free air.  Musculoskeletal: Lower lumbar spondylitic changes. No fracture or worrisome bone lesion. IMPRESSION: 1. No evidence of active extravasation into the bowel. 2. Slight interval enlargement of posterior gastric diverticulum without adjacent inflammatory/edematous change. Electronically Signed   By: Lucrezia Europe M.D.   On: 02/04/2021 13:40    Orson Eva, DO  [facility]  If 7PM-7AM, please contact night-coverage [email or web address] Password [email or web address] 02/18/2021, 3:16 PM   LOS: 0 days

## 2021-02-18 NOTE — Progress Notes (Signed)
[place] A. Merlene Laughter, MD     [email or web address]          Alexa Nichols is an 75 y.o. female.   ASSESSMENT/PLAN: 1.  Progressive acute/subacute leg weakness with areflexia most consistent with acute inflammatory demyelinating poly-radicularneuritis/Guillain-Barr syndrome.  The patient will be treated as such with immunoglobulins per the typical protocol.  The spinal fluid analysis will be obtained when possible.  This requires to hold heparin or Lovenox for now.  Pulmonary function test will also be obtained to assess her baseline and probably should be done daily.  Consider placing the patient in ICU if the [facility] gets below 74mL per kilogram.  Patient should be continued on telemetry for now.  PT OT will be required. 2.  Vitamin B12 deficiency which will be replaced 3.  Chronic low back pain 4.  Chronic statin use: Statin will be discontinued for a few months in case she has statin induced myopathy either the acute or HMG coenzyme reductase antibody mediated statin myopathy.   She reports no deterioration in weakness.  She continues to have numbness of the feet.  No breathing problems are reported.  No problems swallowing.  CSF analysis shows classic cytoalbuminologic  dissociation consistent with Guillain-Barr syndrome.   GENERAL: This a pleasant female who is doing well at this time.  HEENT: Neck is supple no trauma noted.  ABDOMEN: soft  EXTREMITIES: No edema   BACK: Normal  SKIN: Normal by inspection.    MENTAL STATUS: Alert and oriented. Speech, language and cognition are generally intact. Judgment and insight normal.   CRANIAL NERVES: Pupils are equal, round and reactive to light and accomodation; extra ocular movements are full, there is no significant nystagmus; visual fields are full; upper and lower facial muscles are normal in strength and symmetric, there is no flattening of the nasolabial folds; tongue is midline; uvula is midline; shoulder  elevation is normal.  MOTOR: Deltoids are 4+/5.  Handgrip 5.  Bulk and tone are normal throughout.  Hip flexion shows marked weakness 2/5.  Dorsiflexion 4/5 bilaterally.  COORDINATION: Left finger to nose is normal, right finger to nose is normal, No rest tremor; no intention tremor; no postural tremor; no bradykinesia.  REFLEXES: Deep tendon reflexes are symmetrical and normal in the upper extremities but absent in the legs. Plantar reflexes are flexor bilaterally.   SENSATION: This is slightly diminished in the feet bilaterally.      Blood pressure (!) 151/78, pulse 82, temperature 98.1 F (36.7 C), temperature source Oral, resp. rate 17, height 4\' 10"  (1.473 m), weight 62.6 kg, SpO2 97 %.  Past Medical History:  Diagnosis Date  . Asthma   . Chronic low back pain   . COPD (chronic obstructive pulmonary disease) ([place])   . Depression   . GERD (gastroesophageal reflux disease)   . Heart murmur   . Lobular carcinoma of left breast ([place]) 1997   in situ  . Other and unspecified hyperlipidemia   . Seasonal allergies     Past Surgical History:  Procedure Laterality Date  . BIOPSY  12/03/2020   Procedure: BIOPSY;  Surgeon: Rogene Houston, MD;  [facility];  Service: Endoscopy;;  gastric polyps biopsies;  . BREAST SURGERY Left    lumpectomy   . CESAREAN SECTION  1986  . CHOLECYSTECTOMY    . COLONOSCOPY WITH PROPOFOL N/A 12/03/2020    two 4-7 mm polyps in cecum, one small polyp in cecum, one 5 mm polyp, sigmoid diverticulosis, path  with tubular adenomas  . ESOPHAGOGASTRODUODENOSCOPY (EGD) WITH PROPOFOL N/A 12/03/2020   normal esophagus, multiple gastric polyps s/p biopsy, small paraesophageal hernia, normal duodenum. Fundic gland polyps.  . ESOPHAGOGASTRODUODENOSCOPY (EGD) WITH PROPOFOL N/A 02/05/2021   Procedure: ESOPHAGOGASTRODUODENOSCOPY (EGD) WITH PROPOFOL;  Surgeon: [name], MD;  [facility];  Service: Endoscopy;  Laterality: N/A;  . GIVENS CAPSULE  STUDY N/A 12/16/2020   normal small bowel capsule but rapid transit time of 21 minutes. Recommended to resume alendronate.   Marland Kitchen GIVENS CAPSULE STUDY N/A 02/04/2021   Procedure: GIVENS CAPSULE STUDY;  Surgeon: Rogene Houston, MD;  [facility];  Service: Endoscopy;  Laterality: N/A;  . HOT HEMOSTASIS  02/05/2021   Procedure: HOT HEMOSTASIS (ARGON PLASMA COAGULATION/BICAP);  Surgeon: [name], MD;  [facility];  Service: Endoscopy;;  . TONSILLECTOMY     age 38    Family History  Problem Relation Age of Onset  . Osteoporosis Mother   . Other Father        head trauma  . Breast cancer Sister        X's 2 sisters    Social History:  reports that she quit smoking about 37 years ago. Her smoking use included cigarettes. She started smoking about 56 years ago. She has a 10.00 pack-year smoking history. She has never used smokeless tobacco. She reports that she does not drink alcohol and does not use drugs.  Allergies:  Allergies  Allergen Reactions  . Aspirin     samter's syndrome  . Codeine Itching  . Sulfa Antibiotics     Unknown reaction  . Other Rash    gel filled fentanyl patch, adhesive on that patch caused ithcing    Medications: Prior to Admission medications   Medication Sig Start Date End Date Taking? Authorizing Provider  acetaminophen (TYLENOL) 500 MG tablet Take 500 mg by mouth every 6 (six) hours as needed for moderate pain or headache.   Yes [provider]  ALPRAZolam Duanne Moron) 0.5 MG tablet Take 0.25 mg by mouth daily as needed for anxiety.   Yes [provider]  fentaNYL (DURAGESIC - DOSED MCG/HR) 25 MCG/HR patch Place 25 mcg onto the skin See admin instructions. Place 25 mcg onto the skin for 72 hours then remove as needed for pain   Yes [provider]  ferrous sulfate 325 (65 FE) MG tablet Take 1 tablet (325 mg total) by mouth daily with breakfast. 02/08/21  Yes [name], [name], MD   HYDROcodone-acetaminophen (NORCO/VICODIN) 5-325 MG per tablet Take 1 tablet by mouth 2 (two) times daily as needed for moderate pain.    Yes [provider]  montelukast (SINGULAIR) 10 MG tablet Take 10 mg by mouth at bedtime.   Yes [provider]  Multiple Vitamins-Minerals (PRESERVISION AREDS 2) CAPS Take 1 capsule by mouth in the morning and at bedtime.   Yes [provider]  pantoprazole (PROTONIX) 40 MG tablet Take 1 tablet (40 mg total) by mouth 2 (two) times daily. 02/07/21 05/08/21 Yes Manuella Ghazi, Pratik D, DO  Pediatric Multivitamins-Iron [facility] COMPLETE) 18 MG CHEW Chew 1 tablet by mouth 2 (two) times daily. 01/27/21  Yes Rehman, Mechele Dawley, MD  sertraline (ZOLOFT) 50 MG tablet Take 25 mg by mouth daily. 01/26/16  Yes [provider]  simvastatin (ZOCOR) 20 MG tablet Take 20 mg by mouth daily.   Yes [provider]  tiZANidine (ZANAFLEX) 4 MG tablet Take 2 mg by mouth at  bedtime as needed for muscle spasms.   Yes [provider]  TRELEGY ELLIPTA 200-62.5-25 MCG/INH AEPB Inhale 1 puff into the lungs daily. 12/21/20  Yes [provider]    Scheduled Meds: .  stroke: mapping our early stages of recovery book   Does not apply Once  . ezetimibe  10 mg Oral Daily  . fluticasone furoate-vilanterol  1 puff Inhalation Daily  . gabapentin  100 mg Oral TID  . montelukast  10 mg Oral QHS  . pantoprazole  40 mg Oral BID  . sertraline  25 mg Oral Daily  . umeclidinium bromide  1 puff Inhalation Daily   Continuous Infusions: . IMMUNE GLOBULIN 10% (HUMAN) IV - For Fluid Restriction Only     PRN Meds:.acetaminophen **OR** acetaminophen (TYLENOL) oral liquid 160 mg/5 mL **OR** acetaminophen, ALPRAZolam, HYDROcodone-acetaminophen, ondansetron (ZOFRAN) IV     Results for orders placed or performed during the hospital encounter of 02/16/21 (from the past 48 hour(s))  Troponin I (High Sensitivity)     Status: None   Collection Time: 02/16/21   5:52 PM  Result Value Ref Range   Troponin I (High Sensitivity) 4 <18 ng/L    Comment: (NOTE) Elevated high sensitivity troponin I (hsTnI) values and significant  changes across serial measurements may suggest ACS but many other  chronic and acute conditions are known to elevate hsTnI results.  Refer to the "Links" section for chest pain algorithms and additional  guidance. Performed at [facility], [address], [place], [place] [address]   Troponin I (High Sensitivity)     Status: None   Collection Time: 02/16/21  7:16 PM  Result Value Ref Range   Troponin I (High Sensitivity) 5 <18 ng/L    Comment: (NOTE) Elevated high sensitivity troponin I (hsTnI) values and significant  changes across serial measurements may suggest ACS but many other  chronic and acute conditions are known to elevate hsTnI results.  Refer to the "Links" section for chest pain algorithms and additional  guidance. Performed at [facility], [address], [place], [place] [address]   Resp Panel by RT-PCR (Flu A&B, Covid) Nasopharyngeal Swab     Status: None   Collection Time: 02/16/21  8:11 PM   Specimen: Nasopharyngeal Swab; Nasopharyngeal(NP) swabs in vial transport medium  Result Value Ref Range   SARS Coronavirus 2 by RT PCR NEGATIVE NEGATIVE    Comment: (NOTE) SARS-CoV-2 target nucleic acids are NOT DETECTED.  The SARS-CoV-2 RNA is generally detectable in upper respiratory specimens during the acute phase of infection. The lowest concentration of SARS-CoV-2 viral copies this assay can detect is 138 copies/mL. A negative result does not preclude SARS-Cov-2 infection and should not be used as the sole basis for treatment or other patient management decisions. A negative result may occur with  improper specimen collection/handling, submission of specimen other than nasopharyngeal swab, presence of viral mutation(s) within the areas targeted by this assay, and inadequate number of viral copies(<138  copies/mL). A negative result must be combined with clinical observations, patient history, and epidemiological information. The expected result is Negative.  Fact Sheet for Patients:  EntrepreneurPulse.com.au  Fact Sheet for Healthcare Providers:  IncredibleEmployment.be  This test is no t yet approved or cleared by the [place] FDA and  has been authorized for detection and/or diagnosis of SARS-CoV-2 by FDA under an Emergency Use Authorization (EUA). This EUA will remain  in effect (meaning this test can be used) for the duration of the COVID-19 declaration under  Section 564(b)(1) of the Act, 21 U.S.C.section 360bbb-3(b)(1), unless the authorization is terminated  or revoked sooner.       Influenza A by PCR NEGATIVE NEGATIVE   Influenza B by PCR NEGATIVE NEGATIVE    Comment: (NOTE) The Xpert Xpress SARS-CoV-2/FLU/RSV plus assay is intended as an aid in the diagnosis of influenza from Nasopharyngeal swab specimens and should not be used as a sole basis for treatment. Nasal washings and aspirates are unacceptable for Xpert Xpress SARS-CoV-2/FLU/RSV testing.  Fact Sheet for Patients: EntrepreneurPulse.com.au  Fact Sheet for Healthcare Providers: IncredibleEmployment.be  This test is not yet approved or cleared by the [place] FDA and has been authorized for detection and/or diagnosis of SARS-CoV-2 by FDA under an Emergency Use Authorization (EUA). This EUA will remain in effect (meaning this test can be used) for the duration of the COVID-19 declaration under Section 564(b)(1) of the Act, 21 U.S.C. section 360bbb-3(b)(1), unless the authorization is terminated or revoked.  Performed at [facility], [address], [place], [place] [address]   Hemoglobin A1c     Status: Abnormal   Collection Time: 02/17/21  6:14 AM  Result Value Ref Range   Hgb A1c MFr Bld 4.6 (L) 4.8 - 5.6 %    Comment:  (NOTE) Pre diabetes:          5.7%-6.4%  Diabetes:              >6.4%  Glycemic control for   <7.0% adults with diabetes    Mean Plasma Glucose 85.32 mg/dL    Comment: Performed at [facility] [address] [place], [place] [address]  Lipid panel     Status: Abnormal   Collection Time: 02/17/21  6:14 AM  Result Value Ref Range   Cholesterol 161 0 - 200 mg/dL   Triglycerides 285 (H) <150 mg/dL   HDL 36 (L) >40 mg/dL   Total CHOL/HDL Ratio 4.5 RATIO   VLDL 57 (H) 0 - 40 mg/dL   LDL Cholesterol 68 0 - 99 mg/dL    Comment:        Total Cholesterol/HDL:CHD Risk Coronary Heart Disease Risk Table                     Men   Women  1/2 Average Risk   3.4   3.3  Average Risk       5.0   4.4  2 X Average Risk   9.6   7.1  3 X Average Risk  23.4   11.0        Use the calculated Patient Ratio above and the CHD Risk Table to determine the patient's CHD Risk.        ATP III CLASSIFICATION (LDL):  <100     mg/dL   Optimal  100-129  mg/dL   Near or Above                    Optimal  130-159  mg/dL   Borderline  160-189  mg/dL   High  >190     mg/dL   Very High Performed at [facility] [place],  [address]   CBC     Status: Abnormal   Collection Time: 02/17/21  6:14 AM  Result Value Ref Range   WBC 8.8 4.0 - 10.5 K/uL   RBC 3.99 3.87 - 5.11 MIL/uL   Hemoglobin 11.0 (L) 12.0 - 15.0 g/dL   HCT 36.2 36.0 -  46.0 %   MCV 90.7 80.0 - 100.0 fL   MCH 27.6 26.0 - 34.0 pg   MCHC 30.4 30.0 - 36.0 g/dL   RDW 15.1 11.5 - 15.5 %   Platelets 342 150 - 400 K/uL   nRBC 0.0 0.0 - 0.2 %    Comment: Performed at [facility], [address], [place], [place] [address]  Comprehensive metabolic panel     Status: Abnormal   Collection Time: 02/17/21  6:14 AM  Result Value Ref Range   Sodium 139 135 - 145 mmol/L   Potassium 4.3 3.5 - 5.1 mmol/L   Chloride 102 98 - 111 mmol/L   CO2 27 22 - 32 mmol/L   Glucose, Bld 104 (H) 70 - 99 mg/dL    Comment: Glucose reference  range applies only to samples taken after fasting for at least 8 hours.   BUN 14 8 - 23 mg/dL   Creatinine, Ser 0.46 0.44 - 1.00 mg/dL   Calcium 9.0 8.9 - 10.3 mg/dL   Total Protein 7.2 6.5 - 8.1 g/dL   Albumin 3.9 3.5 - 5.0 g/dL   AST 22 15 - 41 U/L   ALT 18 0 - 44 U/L   Alkaline Phosphatase 88 38 - 126 U/L   Total Bilirubin 0.4 0.3 - 1.2 mg/dL   GFR, Estimated >60 >60 mL/min    Comment: (NOTE) Calculated using the CKD-EPI Creatinine Equation (2021)    Anion gap 10 5 - 15    Comment: Performed at [facility], [address] [place], [place] [address]  CK total and CKMB (cardiac)not at [facility]     Status: None   Collection Time: 02/17/21  8:26 PM  Result Value Ref Range   Total CK 45 38 - 234 U/L   CK, MB 1.3 0.5 - 5.0 ng/mL   Relative Index RELATIVE INDEX IS INVALID 0.0 - 2.5    Comment: WHEN CK < 100 U/L        Performed at [facility] [address] [place], [place] [address]   CBC     Status: Abnormal   Collection Time: 02/18/21  5:56 AM  Result Value Ref Range   WBC 8.9 4.0 - 10.5 K/uL   RBC 3.99 3.87 - 5.11 MIL/uL   Hemoglobin 11.0 (L) 12.0 - 15.0 g/dL   HCT 37.0 36.0 - 46.0 %   MCV 92.7 80.0 - 100.0 fL   MCH 27.6 26.0 - 34.0 pg   MCHC 29.7 (L) 30.0 - 36.0 g/dL   RDW 15.6 (H) 11.5 - 15.5 %   Platelets 312 150 - 400 K/uL   nRBC 0.0 0.0 - 0.2 %    Comment: Performed at [facility], [address] [place], [place] [address]  Basic metabolic panel     Status: Abnormal   Collection Time: 02/18/21  5:56 AM  Result Value Ref Range   Sodium 137 135 - 145 mmol/L   Potassium 4.3 3.5 - 5.1 mmol/L   Chloride 100 98 - 111 mmol/L   CO2 28 22 - 32 mmol/L   Glucose, Bld 99 70 - 99 mg/dL    Comment: Glucose reference range applies only to samples taken after fasting for at least 8 hours.   BUN 14 8 - 23 mg/dL   Creatinine, Ser 0.38 (L) 0.44 - 1.00 mg/dL   Calcium 9.0 8.9 - 10.3 mg/dL   GFR, Estimated >60 >60 mL/min    Comment: (NOTE) Calculated using the CKD-EPI  Creatinine Equation (2021)    Anion gap 9 5 - 15    Comment: Performed at [facility], [address], [place], [place] [address]  Cryptococcal antigen, CSF     Status: None   Collection Time: 02/18/21 10:51 AM  Result Value Ref Range   Crypto Ag NEGATIVE NEGATIVE   Cryptococcal Ag Titer NOT INDICATED NOT INDICATED    Comment: Performed at [facility] [address] [place], [place] [address]  Protein and glucose, CSF     Status: Abnormal   Collection Time: 02/18/21 10:51 AM  Result Value Ref Range   Glucose, CSF 82 (H) 40 - 70 mg/dL   Total  Protein, CSF 91 (H) 15 - 45 mg/dL    Comment: Performed at [facility], [address] [place], [place] [address]  CSF cell count with differential     Status: Abnormal   Collection Time: 02/18/21 10:51 AM  Result Value Ref Range   Tube # 1    Color, CSF COLORLESS COLORLESS   Appearance, CSF CLEAR CLEAR   Supernatant CLEAR    RBC Count, CSF 4 (H) 0 /cu mm   WBC, CSF 2 0 - 5 /cu mm   Segmented Neutrophils-CSF TOO FEW TO COUNT, SMEAR AVAILABLE FOR REVIEW 0 - 6 %   Lymphs, CSF TOO FEW TO COUNT, SMEAR AVAILABLE FOR REVIEW 40 - 80 %   Monocyte-Macrophage-Spinal Fluid TOO FEW TO COUNT, SMEAR AVAILABLE FOR REVIEW 15 - 45 %   Eosinophils, CSF TOO FEW TO COUNT, SMEAR AVAILABLE FOR REVIEW 0 - 1 %   Other Cells, CSF OCCASIONAL MONONUCLEAR CELLS SEEN     Comment: Performed at [facility], [address], [place], [place] [address]  CSF culture w Stat Gram Stain     Status: None (Preliminary result)   Collection Time: 02/18/21 10:51 AM   Specimen: CSF; Cerebrospinal Fluid  Result Value Ref Range   Specimen Description CSF    Special Requests NONE    Gram Stain      NO ORGANISMS SEEN CYTOSPIN SMEAR WBC PRESENT, PREDOMINANTLY MONONUCLEAR Performed at [facility], [address], [place],  [address]    Culture PENDING    Report Status PENDING     Studies/Results:  BRAIN MRI  FINDINGS: Brain: No acute infarction, hemorrhage,  hydrocephalus, or extra-axial fluid collection. Mild for age T2/FLAIR hyperintensities within the white matter, most likely related to chronic microvascular ischemic disease. Incidental 11 mm calcified extra-axial, dural-based lesion along the posterior right vertex (series 11, image 23), likely a meningioma. No mass effect or surrounding edema.  Vascular: Major arterial flow voids are maintained at the skull base.  Skull and upper cervical spine: Normal marrow signal.  Sinuses/Orbits: Evidence of prior endoscopic sinus surgery with bilateral maxillary antrostomies and ethmoidectomies. Moderate pansinus mucosal thickening, greatest in the ethmoid air cells and sphenoid sinuses. Air-fluid level in the right sphenoid sinus.  Other: Small right mastoid effusion.  IMPRESSION: 1. No evidence of acute intracranial abnormality. Specifically, no acute infarct. 2. Incidental presumed 11 mm meningioma along the vertex without mass effect or surrounding edema. 3. Evidence of prior endoscopic sinus surgery with moderate paranasal sinus mucosal thickening, as detailed above.    L SPINE MRI IMPRESSION: Lumbar spondylosis, as outlined and with findings most notably as follows.  At L5-S1, there is trace grade 1 anterolisthesis. Mild disc degeneration. Disc bulge. Mild endplate spurring. Facet arthrosis/ligamentum flavum hypertrophy. Bilateral subarticular narrowing (mild right, mild/moderate left) with some crowding of  the left greater than right descending S1 nerve roots. Mild relative bilateral neural foraminal narrowing.  At L3-L4, there is trace retrolisthesis. Mild disc degeneration. Superimposed shallow broad-based right subarticular to right foraminal disc protrusion. Mild right subarticular narrowing without frank nerve root impingement. Minimal relative right neural foraminal narrowing without nerve root impingement.  No significant spinal canal or neural foraminal  stenosis at the remaining levels.    T SPINE MRIIMPRESSION: Exaggerated thoracic kyphosis and mild degenerative changes without high-grade spinal canal or neural foraminal stenosis at any level.   BRAIN MRI reviewed in person no stroke; hemorrhage; minimal PVWD;     Arav Bannister A. Merlene Laughter, M.D.  Diplomate, Tax adviser of Psychiatry and Neurology ( Neurology). 02/18/2021, 5:08 PM

## 2021-02-18 NOTE — NC FL2 (Signed)
[place] LEVEL OF CARE SCREENING TOOL     IDENTIFICATION  Patient Name: Alexa Nichols Birthdate: 07-Mar-1946 Sex: female Admission Date (Current Location): 02/16/2021  [facility] and [place] Number:  [employer] and Address:  [place] [address] [place]      Provider Number: [id number]  Attending Physician Name and Address:  Orson Eva, MD  Relative Name and Phone Number:       Current Level of Care: Hospital Recommended Level of Care: [place] Prior Approval Number:    Date Approved/Denied:   PASRR Number:    Discharge Plan: SNF    Current Diagnoses: Patient Active Problem List   Diagnosis Date Noted  . AIDP (acute inflammatory demyelinating polyneuropathy) ([place]) 02/18/2021  . Lower extremity weakness 02/16/2021  . Acute blood loss anemia 02/04/2021  . Anemia 02/03/2021  . COPD (chronic obstructive pulmonary disease) ([place]) 02/03/2021  . Depression 02/03/2021  . Intermediate stage nonexudative age-related macular degeneration of left eye 12/18/2020  . Microcytic anemia 11/05/2020  . Melena 11/05/2020  . Early stage nonexudative age-related macular degeneration of left eye 07/21/2020  . Degenerative retinal drusen of right eye 02/14/2020  . Early stage nonexudative age-related macular degeneration of right eye 02/14/2020  . Exudative age-related macular degeneration of right eye with active choroidal neovascularization ([place]) 02/14/2020  . History of vitrectomy 02/14/2020  . Chest pain 01/15/2014  . Mitral regurgitation 01/15/2014  . Hyperlipidemia 01/15/2014    Orientation RESPIRATION BLADDER Height & Weight     Self,Time,Situation,Place  Normal Continent Weight: 138 lb (62.6 kg) Height:  4\' 10"  (147.3 cm)  BEHAVIORAL SYMPTOMS/MOOD NEUROLOGICAL BOWEL NUTRITION STATUS      Continent Diet (see dc summary)  AMBULATORY STATUS COMMUNICATION OF NEEDS Skin   Extensive Assist Verbally Normal                        Personal Care Assistance Level of Assistance  Bathing,Feeding,Dressing Bathing Assistance: Limited assistance Feeding assistance: Independent Dressing Assistance: Limited assistance     Functional Limitations Info  Sight,Hearing,Speech Sight Info: Adequate Hearing Info: Adequate Speech Info: Adequate    SPECIAL CARE FACTORS FREQUENCY  PT (By licensed PT),OT (By licensed OT)     PT Frequency: 5x week OT Frequency: 3x week            Contractures Contractures Info: Not present    Additional Factors Info  Code Status,Allergies,Psychotropic Code Status Info: Full Allergies Info: Aspirin, Codeine, Sulfa Antibiotics Psychotropic Info: Xanax, Zoloft         Current Medications (02/18/2021):  This is the current hospital active medication list Current Facility-Administered Medications  Medication Dose Route Frequency Provider Last Rate Last Admin  .  stroke: mapping our early stages of recovery book   Does not apply Once Zierle-Ghosh, Asia B, DO      . acetaminophen (TYLENOL) tablet 650 mg  650 mg Oral Q4H PRN Zierle-Ghosh, Asia B, DO   650 mg at 02/17/21 0218   Or  . acetaminophen (TYLENOL) 160 MG/5ML solution 650 mg  650 mg Per Tube Q4H PRN Zierle-Ghosh, Asia B, DO       Or  . acetaminophen (TYLENOL) suppository 650 mg  650 mg Rectal Q4H PRN Zierle-Ghosh, Asia B, DO      . ALPRAZolam (XANAX) tablet 0.25 mg  0.25 mg Oral Daily PRN Zierle-Ghosh, Asia B, DO   0.25 mg at 02/17/21 2240  . ezetimibe (ZETIA) tablet 10  mg  10 mg Oral Daily Barton Dubois, MD   10 mg at 02/18/21 0932  . fluticasone furoate-vilanterol (BREO ELLIPTA) 100-25 MCG/INH 1 puff  1 puff Inhalation Daily Zierle-Ghosh, Asia B, DO   1 puff at 02/18/21 0819  . gabapentin (NEURONTIN) capsule 100 mg  100 mg Oral TID Barton Dubois, MD   100 mg at 02/18/21 0932  . HYDROcodone-acetaminophen (NORCO/VICODIN) 5-325 MG per tablet 1 tablet  1 tablet Oral Q6H PRN Orson Eva, MD   1 tablet at  02/18/21 1012  . Immune Globulin 10% (PRIVIGEN) IV infusion 25 g  400 mg/kg Intravenous Q24H Phillips Odor, MD   25 g at 02/17/21 2243  . montelukast (SINGULAIR) tablet 10 mg  10 mg Oral QHS Zierle-Ghosh, Asia B, DO   10 mg at 02/17/21 2240  . ondansetron (ZOFRAN) injection 4 mg  4 mg Intravenous Q6H PRN Zierle-Ghosh, Asia B, DO   4 mg at 02/17/21 0353  . pantoprazole (PROTONIX) EC tablet 40 mg  40 mg Oral BID Zierle-Ghosh, Asia B, DO   40 mg at 02/18/21 0933  . sertraline (ZOLOFT) tablet 25 mg  25 mg Oral Daily Zierle-Ghosh, Asia B, DO   25 mg at 02/18/21 0933  . umeclidinium bromide (INCRUSE ELLIPTA) 62.5 MCG/INH 1 puff  1 puff Inhalation Daily Zierle-Ghosh, Asia B, DO   1 puff at 02/18/21 0819     Discharge Medications: Please see discharge summary for a list of discharge medications.  Relevant Imaging Results:  Relevant Lab Results:   Additional Information SSN: [id number], [place]

## 2021-02-18 NOTE — Progress Notes (Signed)
Patient performed -60 NIF and 1.2L FVC. Good attempt noted.

## 2021-02-18 NOTE — Progress Notes (Signed)
Transition of Care (TOC) -30 day Note       Patient Details  Name: Alexa Nichols MRN: [id number] Date of Birth: 19-Aug-1946   Transition of Care [facility]) CM/SW Contact  Name: Shade Flood Phone Number: [phone] Date: 02/18/2021 Time: 6767   MUST ID: [id number]   To Whom it May Concern:   Please be advised that the above patient will require a short-term nursing home stay, anticipated 30 days or less rehabilitation and strengthening. The plan is for return home.

## 2021-02-19 DIAGNOSIS — G610 Guillain-Barre syndrome: Secondary | ICD-10-CM | POA: Diagnosis not present

## 2021-02-19 LAB — VDRL, CSF: VDRL Quant, CSF: NONREACTIVE

## 2021-02-19 NOTE — Progress Notes (Signed)
[place] A. Merlene Laughter, MD     [email or web address]          Alexa Nichols is an 75 y.o. female.   ASSESSMENT/PLAN: 1.  Progressive acute/subacute leg weakness with areflexia most consistent with acute inflammatory demyelinating poly-radicularneuritis/Guillain-Barr syndrome.  The patient will be treated as such with immunoglobulins per the typical protocol. Pulmonary function test will also be obtained to assess her baseline and probably should be done daily.  Consider placing the patient in ICU if the [facility] gets below 68mL per kilogram.  Patient should be continued on telemetry for now.  PT OT will be required. 2.  Vitamin B12 deficiency which will be replaced 3.  Chronic low back pain 4.  Chronic statin use: Statin will be discontinued for a few months in case she has statin induced myopathy either the acute or HMG coenzyme reductase antibody mediated statin myopathy.    She reports being about the same. She is frustrated that her legs have not improved but I did try to explain to her that the treatment with immunoglobulins is prevent deterioration and potential intubation.   GENERAL: This a pleasant female who is doing well at this time.  HEENT: Neck is supple no trauma noted.  ABDOMEN: soft  EXTREMITIES: No edema   BACK: Normal  SKIN: Normal by inspection.    MENTAL STATUS: Alert and oriented. Speech, language and cognition are generally intact. Judgment and insight normal.   CRANIAL NERVES: Pupils are equal, round and reactive to light and accomodation; extra ocular movements are full, there is no significant nystagmus; visual fields are full; upper and lower facial muscles are normal in strength and symmetric, there is no flattening of the nasolabial folds; tongue is midline; uvula is midline; shoulder elevation is normal.  MOTOR: Deltoids are 4+/5.  Handgrip 5.  Bulk and tone are normal throughout.  Hip flexion shows marked weakness 2/5.  Dorsiflexion 4/5  bilaterally.  COORDINATION: Left finger to nose is normal, right finger to nose is normal, No rest tremor; no intention tremor; no postural tremor; no bradykinesia.  REFLEXES: Deep tendon reflexes are symmetrical and normal in the upper extremities but absent in the legs. Plantar reflexes are flexor bilaterally.   SENSATION: This is slightly diminished in the feet bilaterally.      Blood pressure (!) 155/78, pulse 99, temperature 99 F (37.2 C), temperature source Oral, resp. rate 19, height 4\' 10"  (1.473 m), weight 62.6 kg, SpO2 96 %.  Past Medical History:  Diagnosis Date  . Asthma   . Chronic low back pain   . COPD (chronic obstructive pulmonary disease) ([place])   . Depression   . GERD (gastroesophageal reflux disease)   . Heart murmur   . Lobular carcinoma of left breast ([place]) 1997   in situ  . Other and unspecified hyperlipidemia   . Seasonal allergies     Past Surgical History:  Procedure Laterality Date  . BIOPSY  12/03/2020   Procedure: BIOPSY;  Surgeon: Rogene Houston, MD;  [facility];  Service: Endoscopy;;  gastric polyps biopsies;  . BREAST SURGERY Left    lumpectomy   . CESAREAN SECTION  1986  . CHOLECYSTECTOMY    . COLONOSCOPY WITH PROPOFOL N/A 12/03/2020    two 4-7 mm polyps in cecum, one small polyp in cecum, one 5 mm polyp, sigmoid diverticulosis, path with tubular adenomas  . ESOPHAGOGASTRODUODENOSCOPY (EGD) WITH PROPOFOL N/A 12/03/2020   normal esophagus, multiple gastric polyps s/p biopsy, small paraesophageal hernia,  normal duodenum. Fundic gland polyps.  . ESOPHAGOGASTRODUODENOSCOPY (EGD) WITH PROPOFOL N/A 02/05/2021   Procedure: ESOPHAGOGASTRODUODENOSCOPY (EGD) WITH PROPOFOL;  Surgeon: [name], MD;  [facility];  Service: Endoscopy;  Laterality: N/A;  . GIVENS CAPSULE STUDY N/A 12/16/2020   normal small bowel capsule but rapid transit time of 21 minutes. Recommended to resume alendronate.   Marland Kitchen GIVENS CAPSULE STUDY N/A 02/04/2021    Procedure: GIVENS CAPSULE STUDY;  Surgeon: Rogene Houston, MD;  [facility];  Service: Endoscopy;  Laterality: N/A;  . HOT HEMOSTASIS  02/05/2021   Procedure: HOT HEMOSTASIS (ARGON PLASMA COAGULATION/BICAP);  Surgeon: [name], MD;  [facility];  Service: Endoscopy;;  . TONSILLECTOMY     age 78    Family History  Problem Relation Age of Onset  . Osteoporosis Mother   . Other Father        head trauma  . Breast cancer Sister        X's 2 sisters    Social History:  reports that she quit smoking about 37 years ago. Her smoking use included cigarettes. She started smoking about 56 years ago. She has a 10.00 pack-year smoking history. She has never used smokeless tobacco. She reports that she does not drink alcohol and does not use drugs.  Allergies:  Allergies  Allergen Reactions  . Aspirin     samter's syndrome  . Codeine Itching  . Sulfa Antibiotics     Unknown reaction  . Other Rash    gel filled fentanyl patch, adhesive on that patch caused ithcing    Medications: Prior to Admission medications   Medication Sig Start Date End Date Taking? Authorizing Provider  acetaminophen (TYLENOL) 500 MG tablet Take 500 mg by mouth every 6 (six) hours as needed for moderate pain or headache.   Yes [provider]  ALPRAZolam Duanne Moron) 0.5 MG tablet Take 0.25 mg by mouth daily as needed for anxiety.   Yes [provider]  fentaNYL (DURAGESIC - DOSED MCG/HR) 25 MCG/HR patch Place 25 mcg onto the skin See admin instructions. Place 25 mcg onto the skin for 72 hours then remove as needed for pain   Yes [provider]  ferrous sulfate 325 (65 FE) MG tablet Take 1 tablet (325 mg total) by mouth daily with breakfast. 02/08/21  Yes [name], [name], MD  HYDROcodone-acetaminophen (NORCO/VICODIN) 5-325 MG per tablet Take 1 tablet by mouth 2 (two) times daily as needed for moderate pain.    Yes [provider]  montelukast  (SINGULAIR) 10 MG tablet Take 10 mg by mouth at bedtime.   Yes [provider]  Multiple Vitamins-Minerals (PRESERVISION AREDS 2) CAPS Take 1 capsule by mouth in the morning and at bedtime.   Yes [provider]  pantoprazole (PROTONIX) 40 MG tablet Take 1 tablet (40 mg total) by mouth 2 (two) times daily. 02/07/21 05/08/21 Yes Manuella Ghazi, Pratik D, DO  Pediatric Multivitamins-Iron [facility] COMPLETE) 18 MG CHEW Chew 1 tablet by mouth 2 (two) times daily. 01/27/21  Yes Rehman, Mechele Dawley, MD  sertraline (ZOLOFT) 50 MG tablet Take 25 mg by mouth daily. 01/26/16  Yes [provider]  simvastatin (ZOCOR) 20 MG tablet Take 20 mg by mouth daily.   Yes [provider]  tiZANidine (ZANAFLEX) 4 MG tablet Take 2 mg by mouth at bedtime as needed for muscle spasms.   Yes [provider]  Donnal Debar 200-62.5-25 MCG/INH AEPB Inhale 1 puff into the  lungs daily. 12/21/20  Yes [provider]    Scheduled Meds: .  stroke: mapping our early stages of recovery book   Does not apply Once  . ezetimibe  10 mg Oral Daily  . fluticasone furoate-vilanterol  1 puff Inhalation Daily  . gabapentin  100 mg Oral TID  . montelukast  10 mg Oral QHS  . pantoprazole  40 mg Oral BID  . sertraline  25 mg Oral Daily  . umeclidinium bromide  1 puff Inhalation Daily   Continuous Infusions: . IMMUNE GLOBULIN 10% (HUMAN) IV - For Fluid Restriction Only     PRN Meds:.acetaminophen **OR** acetaminophen (TYLENOL) oral liquid 160 mg/5 mL **OR** acetaminophen, ALPRAZolam, ondansetron (ZOFRAN) IV, oxyCODONE     Results for orders placed or performed during the hospital encounter of 02/16/21 (from the past 48 hour(s))  CK total and CKMB (cardiac)not at [facility]     Status: None   Collection Time: 02/17/21  8:26 PM  Result Value Ref Range   Total CK 45 38 - 234 U/L   CK, MB 1.3 0.5 - 5.0 ng/mL   Relative Index RELATIVE INDEX IS INVALID 0.0 - 2.5    Comment: WHEN CK < 100 U/L         Performed at [facility] [address] [place], [place] [address]   CBC     Status: Abnormal   Collection Time: 02/18/21  5:56 AM  Result Value Ref Range   WBC 8.9 4.0 - 10.5 K/uL   RBC 3.99 3.87 - 5.11 MIL/uL   Hemoglobin 11.0 (L) 12.0 - 15.0 g/dL   HCT 37.0 36.0 - 46.0 %   MCV 92.7 80.0 - 100.0 fL   MCH 27.6 26.0 - 34.0 pg   MCHC 29.7 (L) 30.0 - 36.0 g/dL   RDW 15.6 (H) 11.5 - 15.5 %   Platelets 312 150 - 400 K/uL   nRBC 0.0 0.0 - 0.2 %    Comment: Performed at [facility], [address], [place], [place] [address]  Basic metabolic panel     Status: Abnormal   Collection Time: 02/18/21  5:56 AM  Result Value Ref Range   Sodium 137 135 - 145 mmol/L   Potassium 4.3 3.5 - 5.1 mmol/L   Chloride 100 98 - 111 mmol/L   CO2 28 22 - 32 mmol/L   Glucose, Bld 99 70 - 99 mg/dL    Comment: Glucose reference range applies only to samples taken after fasting for at least 8 hours.   BUN 14 8 - 23 mg/dL   Creatinine, Ser 0.38 (L) 0.44 - 1.00 mg/dL   Calcium 9.0 8.9 - 10.3 mg/dL   GFR, Estimated >60 >60 mL/min    Comment: (NOTE) Calculated using the CKD-EPI Creatinine Equation (2021)    Anion gap 9 5 - 15    Comment: Performed at [facility], [address] [place], [place] [address]  VDRL, CSF     Status: None   Collection Time: 02/18/21 10:51 AM  Result Value Ref Range   VDRL Quant, CSF Non Reactive Non Rea:<1:1    Comment: (NOTE) Performed At: [facility] [address] [place], [place] [id number] Rush Farmer MD [id number]   Cryptococcal antigen, CSF     Status: None   Collection Time: 02/18/21 10:51 AM  Result Value Ref Range   Crypto Ag NEGATIVE NEGATIVE   Cryptococcal Ag Titer NOT INDICATED NOT INDICATED    Comment: Performed at [facility],  [address] [place], [place] [address]  Protein and glucose, CSF     Status: Abnormal   Collection Time: 02/18/21 10:51 AM  Result Value Ref Range   Glucose, CSF 82 (H) 40 - 70 mg/dL   Total   Protein, CSF 91 (H) 15 - 45 mg/dL    Comment: Performed at [facility], [address], [place], [place] [address]  CSF cell count with differential     Status: Abnormal   Collection Time: 02/18/21 10:51 AM  Result Value Ref Range   Tube # 1    Color, CSF COLORLESS COLORLESS   Appearance, CSF CLEAR CLEAR   Supernatant CLEAR    RBC Count, CSF 4 (H) 0 /cu mm   WBC, CSF 2 0 - 5 /cu mm   Segmented Neutrophils-CSF TOO FEW TO COUNT, SMEAR AVAILABLE FOR REVIEW 0 - 6 %   Lymphs, CSF TOO FEW TO COUNT, SMEAR AVAILABLE FOR REVIEW 40 - 80 %   Monocyte-Macrophage-Spinal Fluid TOO FEW TO COUNT, SMEAR AVAILABLE FOR REVIEW 15 - 45 %   Eosinophils, CSF TOO FEW TO COUNT, SMEAR AVAILABLE FOR REVIEW 0 - 1 %   Other Cells, CSF OCCASIONAL MONONUCLEAR CELLS SEEN     Comment: Performed at [facility], [address], [place], [place] [address]  CSF culture w Stat Gram Stain     Status: None (Preliminary result)   Collection Time: 02/18/21 10:51 AM   Specimen: CSF; Cerebrospinal Fluid  Result Value Ref Range   Specimen Description CSF    Special Requests NONE    Gram Stain      NO ORGANISMS SEEN CYTOSPIN SMEAR WBC PRESENT, PREDOMINANTLY MONONUCLEAR Performed at [facility], [address], [place], [place] [address]    Culture PENDING    Report Status PENDING     Studies/Results:  BRAIN MRI  FINDINGS: Brain: No acute infarction, hemorrhage, hydrocephalus, or extra-axial fluid collection. Mild for age T2/FLAIR hyperintensities within the white matter, most likely related to chronic microvascular ischemic disease. Incidental 11 mm calcified extra-axial, dural-based lesion along the posterior right vertex (series 11, image 23), likely a meningioma. No mass effect or surrounding edema.  Vascular: Major arterial flow voids are maintained at the skull base.  Skull and upper cervical spine: Normal marrow signal.  Sinuses/Orbits: Evidence of prior endoscopic sinus surgery with bilateral maxillary  antrostomies and ethmoidectomies. Moderate pansinus mucosal thickening, greatest in the ethmoid air cells and sphenoid sinuses. Air-fluid level in the right sphenoid sinus.  Other: Small right mastoid effusion.  IMPRESSION: 1. No evidence of acute intracranial abnormality. Specifically, no acute infarct. 2. Incidental presumed 11 mm meningioma along the vertex without mass effect or surrounding edema. 3. Evidence of prior endoscopic sinus surgery with moderate paranasal sinus mucosal thickening, as detailed above.    L SPINE MRI IMPRESSION: Lumbar spondylosis, as outlined and with findings most notably as follows.  At L5-S1, there is trace grade 1 anterolisthesis. Mild disc degeneration. Disc bulge. Mild endplate spurring. Facet arthrosis/ligamentum flavum hypertrophy. Bilateral subarticular narrowing (mild right, mild/moderate left) with some crowding of the left greater than right descending S1 nerve roots. Mild relative bilateral neural foraminal narrowing.  At L3-L4, there is trace retrolisthesis. Mild disc degeneration. Superimposed shallow broad-based right subarticular to right foraminal disc protrusion. Mild right subarticular narrowing without frank nerve root impingement. Minimal relative right neural foraminal narrowing without nerve root impingement.  No significant spinal canal or neural foraminal stenosis at the remaining levels.    T SPINE MRIIMPRESSION:  Exaggerated thoracic kyphosis and mild degenerative changes without high-grade spinal canal or neural foraminal stenosis at any level.   BRAIN MRI reviewed in person no stroke; hemorrhage; minimal PVWD;     Garrette Caine A. Merlene Laughter, M.D.  Diplomate, Tax adviser of Psychiatry and Neurology ( Neurology). 02/19/2021, 6:52 PM

## 2021-02-19 NOTE — Progress Notes (Signed)
NIF -70 VC 1.25L  Pt performed NIF/VC with excellent effort.

## 2021-02-19 NOTE — Progress Notes (Signed)
NIF -70 VC 1.2L Pt had very good effort

## 2021-02-19 NOTE — Progress Notes (Signed)
PROGRESS NOTE  Alexa Nichols INO:[id number] DOB: 11-Feb-1946 DOA: 02/16/2021 PCP: [name], MD  Brief History:  75 y.o.female,with history of seasonal duties, lobular carcinoma of left breast, GERD, COPD, and more presents to ED with a chief complaint of leg weakness. Patient reports that she was hospitalized 2 weeks ago for GI bleed and was transfused a couple units. Since she left the hospital she has had pain in her lower back that she attributed to the awkward bed.  She stated her back pain has been chronic for which she takes hydrocodone.  She stated that her back pain was really not much worse than usual.  However, she began noting progressive bilateral leg weakness starting 02/12/21.  She saw her PCP on 02/13/21 who ordered PT, but her leg weakness continued to progress to the point where she was not able to bear weight.  She denied any f/c, cp, sob, n/v/d or radicular symptoms.  Because of progressive leg weakness, she presented for further evaluation.   In the ED Temp 98, heart rate 78-91, respiratory rate 16-20, blood pressure 154/62, satting at 98% Troponin normal at 4 and 5 UA is not indicative of UTI CT head shows no acute findings EKG shows a heart rate of 87, sinus rhythm, QTC 493 Mild leukocytosis of 11.8, hemoglobin 11.9 Telemetry neuro was initially supposed to consult, but they are too backed up so the plan is for inpatient consult in the a.m.  Assessment/Plan: Acute Inflammatory Demyelinating Polyneuropathy (AIDP) -IVIG started 5/17 evening -plan D#3 of 5 days IVIG -02/18/21 CSF consistent with albumincytologic dissociation -PT eval>>SNF -stable on RA without and sob -incentive spirometry -leg weakness about same as yesterday  Chronic back pain -continue oxycodone -MRI T +L spine without cord impingement -MRI brain neg for acute finding except incidental meningioma  COPD -stable on RA -continue Breo, singulair, incruse  GI bleed hx -due  to gastric and duodenal AVMs -s/p endoscopy 02/05/21 -Hgb stable  Depression -continue sertaline  Hyperlipidemia -continue statin      Status is: Inpatient  Remains inpatient appropriate because:IV treatments appropriate due to intensity of illness or inability to take PO   Dispo: The patient is from: Home  Anticipated d/c is to: Home  Patient currently is not medically stable to d/c.              Difficult to place patient No        Family Communication:   No Family at bedside  Consultants:  neurology  Code Status:  FULL   DVT Prophylaxis:  [place] Heparin   Procedures: As Listed in Progress Note Above  Antibiotics: None      Subjective: Patient states legs weakness are about same as yesterday.  Denies f/c, cp, sob, cough, upper ext weakness, n/v/d  Objective: Vitals:   02/18/21 2317 02/19/21 0510 02/19/21 0832 02/19/21 1405  BP: (!) 128/114 (!) 148/96 132/84 (!) 155/78  Pulse: 85 95 87 99  Resp: 18 18 18 19   Temp:  98.4 F (36.9 C) 99.1 F (37.3 C) 99 F (37.2 C)  TempSrc:  Oral Oral Oral  SpO2: 97% 95% 96% 96%  Weight:      Height:        Intake/Output Summary (Last 24 hours) at 02/19/2021 1641 Last data filed at 02/19/2021 1300 Gross per 24 hour  Intake 480 ml  Output --  Net 480 ml   Weight change:  Exam:   General:  Pt is alert, follows commands appropriately, not in acute distress  HEENT: No icterus, No thrush, No neck mass, [place]/AT  Cardiovascular: RRR, S1/S2, no rubs, no gallops  Respiratory: bibasilar crackles. No wheeze  Abdomen: Soft/+BS, non tender, non distended, no guarding  Extremities: No edema, No lymphangitis, No petechiae, No rashes, no synovitis  Neuro:  CN II-XII intact, strength 3/5 in RLE, strength 3/5  LLE; sensation intact bilateral; no dysmetria; babinski equivocal     Data Reviewed: I have personally reviewed following labs and imaging studies Basic  Metabolic Panel: Recent Labs  Lab 02/16/21 1421 02/17/21 0614 02/18/21 0556  NA 138 139 137  K 4.5 4.3 4.3  CL 101 102 100  CO2 26 27 28   GLUCOSE 117* 104* 99  BUN 14 14 14   CREATININE 0.59 0.46 0.38*  CALCIUM 10.0 9.0 9.0   Liver Function Tests: Recent Labs  Lab 02/17/21 0614  AST 22  ALT 18  ALKPHOS 88  BILITOT 0.4  PROT 7.2  ALBUMIN 3.9   No results for input(s): LIPASE, AMYLASE in the last 168 hours. No results for input(s): AMMONIA in the last 168 hours. Coagulation Profile: No results for input(s): INR, PROTIME in the last 168 hours. CBC: Recent Labs  Lab 02/16/21 1421 02/17/21 0614 02/18/21 0556  WBC 11.8* 8.8 8.9  NEUTROABS 7.7  --   --   HGB 11.9* 11.0* 11.0*  HCT 40.0 36.2 37.0  MCV 92.2 90.7 92.7  PLT 406* 342 312   Cardiac Enzymes: Recent Labs  Lab 02/17/21 2026  CKTOTAL 45  CKMB 1.3   BNP: Invalid input(s): POCBNP CBG: No results for input(s): GLUCAP in the last 168 hours. HbA1C: Recent Labs    02/17/21 0614  HGBA1C 4.6*   Urine analysis:    Component Value Date/Time   COLORURINE STRAW (A) 02/16/2021 1600   APPEARANCEUR CLEAR 02/16/2021 1600   LABSPEC 1.008 02/16/2021 1600   PHURINE 5.0 02/16/2021 1600   GLUCOSEU NEGATIVE 02/16/2021 1600   HGBUR NEGATIVE 02/16/2021 1600   BILIRUBINUR NEGATIVE 02/16/2021 1600   KETONESUR NEGATIVE 02/16/2021 1600   PROTEINUR NEGATIVE 02/16/2021 1600   NITRITE NEGATIVE 02/16/2021 1600   LEUKOCYTESUR NEGATIVE 02/16/2021 1600   Sepsis Labs: @LABRCNTIP (procalcitonin:4,lacticidven:4) ) Recent Results (from the past 240 hour(s))  Resp Panel by RT-PCR (Flu A&B, Covid) Nasopharyngeal Swab     Status: None   Collection Time: 02/16/21  8:11 PM   Specimen: Nasopharyngeal Swab; Nasopharyngeal(NP) swabs in vial transport medium  Result Value Ref Range Status   SARS Coronavirus 2 by RT PCR NEGATIVE NEGATIVE Final    Comment: (NOTE) SARS-CoV-2 target nucleic acids are NOT DETECTED.  The SARS-CoV-2 RNA  is generally detectable in upper respiratory specimens during the acute phase of infection. The lowest concentration of SARS-CoV-2 viral copies this assay can detect is 138 copies/mL. A negative result does not preclude SARS-Cov-2 infection and should not be used as the sole basis for treatment or other patient management decisions. A negative result may occur with  improper specimen collection/handling, submission of specimen other than nasopharyngeal swab, presence of viral mutation(s) within the areas targeted by this assay, and inadequate number of viral copies(<138 copies/mL). A negative result must be combined with clinical observations, patient history, and epidemiological information. The expected result is Negative.  Fact Sheet for Patients:  EntrepreneurPulse.com.au  Fact Sheet for Healthcare Providers:  IncredibleEmployment.be  This test is no t yet approved or cleared by the [place] FDA and  has been authorized for detection  and/or diagnosis of SARS-CoV-2 by FDA under an Emergency Use Authorization (EUA). This EUA will remain  in effect (meaning this test can be used) for the duration of the COVID-19 declaration under Section 564(b)(1) of the Act, 21 U.S.C.section 360bbb-3(b)(1), unless the authorization is terminated  or revoked sooner.       Influenza A by PCR NEGATIVE NEGATIVE Final   Influenza B by PCR NEGATIVE NEGATIVE Final    Comment: (NOTE) The Xpert Xpress SARS-CoV-2/FLU/RSV plus assay is intended as an aid in the diagnosis of influenza from Nasopharyngeal swab specimens and should not be used as a sole basis for treatment. Nasal washings and aspirates are unacceptable for Xpert Xpress SARS-CoV-2/FLU/RSV testing.  Fact Sheet for Patients: EntrepreneurPulse.com.au  Fact Sheet for Healthcare Providers: IncredibleEmployment.be  This test is not yet approved or cleared by the  [place] FDA and has been authorized for detection and/or diagnosis of SARS-CoV-2 by FDA under an Emergency Use Authorization (EUA). This EUA will remain in effect (meaning this test can be used) for the duration of the COVID-19 declaration under Section 564(b)(1) of the Act, 21 U.S.C. section 360bbb-3(b)(1), unless the authorization is terminated or revoked.  Performed at [facility], [address], [place], [place] [address]   CSF culture w Stat Gram Stain     Status: None (Preliminary result)   Collection Time: 02/18/21 10:51 AM   Specimen: CSF; Cerebrospinal Fluid  Result Value Ref Range Status   Specimen Description CSF  Final   Special Requests NONE  Final   Gram Stain   Final    NO ORGANISMS SEEN CYTOSPIN SMEAR WBC PRESENT, PREDOMINANTLY MONONUCLEAR Performed at [facility], [address] [place], [place] [address]    Culture PENDING  Incomplete   Report Status PENDING  Incomplete     Scheduled Meds: .  stroke: mapping our early stages of recovery book   Does not apply Once  . ezetimibe  10 mg Oral Daily  . fluticasone furoate-vilanterol  1 puff Inhalation Daily  . gabapentin  100 mg Oral TID  . montelukast  10 mg Oral QHS  . pantoprazole  40 mg Oral BID  . sertraline  25 mg Oral Daily  . umeclidinium bromide  1 puff Inhalation Daily   Continuous Infusions: . IMMUNE GLOBULIN 10% (HUMAN) IV - For Fluid Restriction Only      Procedures/Studies: CT Head Wo Contrast  Result Date: 02/16/2021 CLINICAL DATA:  Rule out stroke.  Bilateral leg weakness for 2 weeks EXAM: CT HEAD WITHOUT CONTRAST TECHNIQUE: Contiguous axial images were obtained from the base of the skull through the vertex without intravenous contrast. COMPARISON:  None. FINDINGS: Brain: No evidence of acute infarction, hemorrhage, hydrocephalus, extra-axial collection or mass lesion/mass effect. Vascular: No hyperdense vessel or unexpected calcification. Skull: Normal. Negative for fracture or focal  lesion. Sinuses/Orbits: Status post bilateral maxillary sinus median antrectomies. Moderate diffuse mucosal thickening noted involving the maxillary sinuses, sphenoid sinuses and ethmoid air cells. Mastoid air cells appear clear. Other: None. IMPRESSION: 1. No acute intracranial abnormalities. 2. Chronic sinus inflammation. Electronically Signed   By: Kerby Moors M.D.   On: 02/16/2021 16:44   MR BRAIN WO CONTRAST  Result Date: 02/17/2021 CLINICAL DATA:  Neuro deficit, acute stroke suspected. Difficulty walking. EXAM: MRI HEAD WITHOUT CONTRAST TECHNIQUE: Multiplanar, multiecho pulse sequences of the brain and surrounding structures were obtained without intravenous contrast. COMPARISON:  CT head Feb 16 2021. FINDINGS: Brain: No acute infarction, hemorrhage, hydrocephalus, or extra-axial fluid collection. Mild for  age T2/FLAIR hyperintensities within the white matter, most likely related to chronic microvascular ischemic disease. Incidental 11 mm calcified extra-axial, dural-based lesion along the posterior right vertex (series 11, image 23), likely a meningioma. No mass effect or surrounding edema. Vascular: Major arterial flow voids are maintained at the skull base. Skull and upper cervical spine: Normal marrow signal. Sinuses/Orbits: Evidence of prior endoscopic sinus surgery with bilateral maxillary antrostomies and ethmoidectomies. Moderate pansinus mucosal thickening, greatest in the ethmoid air cells and sphenoid sinuses. Air-fluid level in the right sphenoid sinus. Other: Small right mastoid effusion. IMPRESSION: 1. No evidence of acute intracranial abnormality. Specifically, no acute infarct. 2. Incidental presumed 11 mm meningioma along the vertex without mass effect or surrounding edema. 3. Evidence of prior endoscopic sinus surgery with moderate paranasal sinus mucosal thickening, as detailed above. Electronically Signed   By: Margaretha Sheffield MD   On: 02/17/2021 09:58   MR THORACIC SPINE WO  CONTRAST  Result Date: 02/17/2021 CLINICAL DATA:  Lumbar radiculopathy. EXAM: MRI THORACIC SPINE WITHOUT CONTRAST TECHNIQUE: Multiplanar, multisequence MR imaging of the thoracic spine was performed. No intravenous contrast was administered. COMPARISON:  None. FINDINGS: Alignment:  Exaggerated thoracic kyphosis. Vertebrae: No fracture, evidence of discitis, or bone lesion. Anterior vertebral body/disc space ankylosis from T3 through T5 and at T6-7. Cord: Normal cord signal. Small indentation on the anterior cord surface at the C6-7 and T8-9 Paraspinal and other soft tissues: Negative. Disc levels: Small posterior disc protrude at C6-7, C7-T1 and T1-2 causing small indentation on the thecal sac without significant spinal canal or neural foraminal stenosis. Small posterior disc protrusions at T6-7 and T8-9 causing small indentations on the anterior cord surface without cord signal abnormality, significant spinal canal stenosis or neural foraminal stenosis. No significant spinal canal or neural foraminal stenosis at the remaining thoracic levels. Right perineural cysts at T6-7 and T10-11 IMPRESSION: Exaggerated thoracic kyphosis and mild degenerative changes without high-grade spinal canal or neural foraminal stenosis at any level. Electronically Signed   By: Pedro Earls M.D.   On: 02/17/2021 17:09   MR LUMBAR SPINE WO CONTRAST  Result Date: 02/17/2021 CLINICAL DATA:  Lumbar radiculopathy, greater than 6 weeks. EXAM: MRI LUMBAR SPINE WITHOUT CONTRAST TECHNIQUE: Multiplanar, multisequence MR imaging of the lumbar spine was performed. No intravenous contrast was administered. COMPARISON:  CT abdomen/pelvis 02/04/2021. FINDINGS: Segmentation: 5 lumbar vertebrae. The caudal most well-formed intervertebral disc space is designated L5-S1 Alignment: Trace L1-L2 grade 1 retrolisthesis. Trace L3-L4 grade 1 retrolisthesis. Trace L5-S1 grade 1 anterolisthesis. Vertebrae: Vertebral body height is  maintained. No significant marrow edema or focal suspicious osseous lesion. L2 vertebral body hemangioma. Conus medullaris and cauda equina: Conus extends to the L2 level. No signal abnormality within the visualized distal spinal cord. Paraspinal and other soft tissues: Tiny T2 hyperintense lesion within the right kidney, too small to characterize but likely reflecting a cyst. Paraspinal soft tissues within normal limits. Disc levels: Mild disc degeneration at the L2-L3 through L5-S1 levels, greatest at L3-L4 and L5-S1. T12-L1: Imaged sagittally. Shallow disc bulge. No significant spinal canal or foraminal stenosis. L1-L2: No significant disc herniation or stenosis. L2-L3: Minimal disc bulge. No significant spinal canal or foraminal stenosis. L3-L4: Disc bulge. Superimposed shallow broad-based right subarticular to right foraminal disc protrusion. Mild relative right subarticular narrowing without frank nerve root impingement. Central canal patent. Minimal relative right neural foraminal narrowing without nerve root impingement. L4-L5: Minimal disc bulge. Minimal facet arthrosis. Mild ligamentum flavum hypertrophy on the right. No significant spinal  canal or foraminal stenosis. L5-S1: Disc bulge. Mild endplate spurring. Facet arthrosis/ligamentum flavum hypertrophy. Bilateral subarticular narrowing (mild right, mild/moderate left) with some crowding of the left greater than right descending S1 nerve roots (series 19, image 25). Central canal patent. Mild relative bilateral neural foraminal narrowing. IMPRESSION: Lumbar spondylosis, as outlined and with findings most notably as follows. At L5-S1, there is trace grade 1 anterolisthesis. Mild disc degeneration. Disc bulge. Mild endplate spurring. Facet arthrosis/ligamentum flavum hypertrophy. Bilateral subarticular narrowing (mild right, mild/moderate left) with some crowding of the left greater than right descending S1 nerve roots. Mild relative bilateral neural  foraminal narrowing. At L3-L4, there is trace retrolisthesis. Mild disc degeneration. Superimposed shallow broad-based right subarticular to right foraminal disc protrusion. Mild right subarticular narrowing without frank nerve root impingement. Minimal relative right neural foraminal narrowing without nerve root impingement. No significant spinal canal or neural foraminal stenosis at the remaining levels. Electronically Signed   By: Kellie Simmering DO   On: 02/17/2021 17:01   DG FL GUIDED LUMBAR PUNCTURE  Result Date: 02/18/2021 CLINICAL DATA:  Neurosarcoidosis. EXAM: DIAGNOSTIC LUMBAR PUNCTURE UNDER FLUOROSCOPIC GUIDANCE COMPARISON:  None. FLUOROSCOPY TIME:  Radiation Exposure Index (if provided by the fluoroscopic device): 22.7 mGy. Number of Acquired Spot Images: 2. PROCEDURE: Informed consent was obtained from the patient prior to the procedure, including potential complications of headache, allergy, and pain. With the patient prone, the lower back was prepped with Betadine. 1% Lidocaine was used for local anesthesia. Under fluoroscopic guidance, lumbar puncture was performed at the L3-4 level using a 20 gauge needle with return of clear CSF. Ten ml of CSF were obtained for laboratory studies. Needle was removed and appropriate dressing was applied. The patient tolerated the procedure well and there were no apparent complications. IMPRESSION: Under fluoroscopic guidance, successful lumbar puncture was performed for diagnostic purposes. Electronically Signed   By: Marijo Conception M.D.   On: 02/18/2021 11:41   CT Angio Abd/Pel w/ and/or w/o  Result Date: 02/04/2021 CLINICAL DATA:  Obscure GI bleed, transfusion dependent anemia. EXAM: CTA ABDOMEN AND PELVIS WITHOUT AND WITH CONTRAST TECHNIQUE: Multidetector CT imaging of the abdomen and pelvis was performed using the standard protocol during bolus administration of intravenous contrast. Multiplanar reconstructed images and MIPs were obtained and reviewed to  evaluate the vascular anatomy. CONTRAST:  123mL OMNIPAQUE IOHEXOL 350 MG/ML SOLN COMPARISON:  12/07/2015 FINDINGS: VASCULAR Aorta: Mild calcified atheromatous plaque. No aneurysm, dissection, or stenosis. Celiac: Patent.  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 SMA: Patent without evidence of aneurysm, dissection, vasculitis or significant stenosis. Renals: Both renal arteries are patent without evidence of aneurysm, dissection, vasculitis, fibromuscular dysplasia or significant stenosis. IMA: Patent without evidence of aneurysm, dissection, vasculitis or significant stenosis. Inflow: Patent without evidence of aneurysm, dissection, vasculitis or significant stenosis. Proximal Outflow: Bilateral common femoral and visualized portions of the superficial and profunda femoral arteries are patent without evidence of aneurysm, dissection, vasculitis or significant stenosis. Veins: Patent hepatic veins, portal vein, SMV, splenic vein, bilateral renal veins, iliac venous system and IVC. No venous pathology identified. Review of the MIP images confirms the above findings. NON-VASCULAR Lower chest: No pleural or pericardial effusion. Minimal linear scarring in the lung bases. Hepatobiliary: No focal liver abnormality is seen. Status post cholecystectomy. No biliary dilatation. Pancreas: Unremarkable. No pancreatic ductal dilatation or surrounding inflammatory changes. Spleen: Normal in size without focal abnormality. Adrenals/Urinary Tract: Adrenal glands unremarkable. Normal renal enhancement. No hydronephrosis. Urinary bladder physiologically distended. Stomach/Bowel: Stomach is nondistended. 6.1 cm  posterior gastric diverticulum (previously 4.9) containing fluid level and hyperdense material. No associated wall thickening nor inflammatory change. The small bowel is nondilated. Normal appendix. The colon is nondilated, unremarkable. No evidence of active extravasation. Lymphatic: No abdominal or pelvic adenopathy.  Reproductive: Uterus and bilateral adnexa are unremarkable. Other: No ascites.  No free air. Musculoskeletal: Lower lumbar spondylitic changes. No fracture or worrisome bone lesion. IMPRESSION: 1. No evidence of active extravasation into the bowel. 2. Slight interval enlargement of posterior gastric diverticulum without adjacent inflammatory/edematous change. Electronically Signed   By: Lucrezia Europe M.D.   On: 02/04/2021 13:40    Orson Eva, DO  [facility]  If 7PM-7AM, please contact night-coverage [email or web address] Password [email or web address] 02/19/2021, 4:41 PM   LOS: 1 day

## 2021-02-20 DIAGNOSIS — G610 Guillain-Barre syndrome: Secondary | ICD-10-CM | POA: Diagnosis not present

## 2021-02-20 MED ORDER — BISACODYL 10 MG RE SUPP
10.0000 mg | Freq: Once | RECTAL | Status: AC
  Administered 2021-02-20: 10 mg via RECTAL
  Filled 2021-02-20: qty 1

## 2021-02-20 MED ORDER — POLYETHYLENE GLYCOL 3350 17 G PO PACK
17.0000 g | PACK | Freq: Every day | ORAL | Status: DC
  Administered 2021-02-20 – 2021-02-23 (×4): 17 g via ORAL
  Filled 2021-02-20 (×4): qty 1

## 2021-02-20 NOTE — Plan of Care (Signed)
  Problem: Coping: Goal: Level of anxiety will decrease Outcome: Progressing   Problem: Elimination: Goal: Will not experience complications related to bowel motility Outcome: Progressing   Problem: Pain Managment: Goal: General experience of comfort will improve Outcome: Progressing   Problem: Safety: Goal: Ability to remain free from injury will improve Outcome: Progressing   

## 2021-02-20 NOTE — Progress Notes (Signed)
Physical Therapy Treatment Patient Details Name: Alexa Nichols MRN: [id number] DOB: 06-01-1946 Today's Date: 02/20/2021    History of Present Illness Alexa Nichols is a 75 y.o. female presents with complaints of BLE weakness. PMH: seasonal allergies, lobular carcinoma of L breast, GERD, COPD    PT Comments    Patient transitions to seated EOB requiring assist to pull to seated and for LE movement with HOB elevated. Patient demonstrates good sitting balance and sitting tolerance EOB. She completes seated exercises requiring assist for proximal weakness at hips. She is able to transfer sit to stand x 5 with use of RW and mod/max assist with greater assist required with fatigue and decreased stance time. Patient assisted back to bed at end of session where she is shown and able to complete supine exercises. Patient fatigued at end of session. Patient will benefit from continued physical therapy in hospital and recommended venue below to increase strength, balance, endurance for safe ADLs and gait.   Follow Up Recommendations  SNF     Equipment Recommendations  None recommended by PT    Recommendations for Other Services       Precautions / Restrictions Precautions Precautions: Fall Restrictions Weight Bearing Restrictions: No    Mobility  Bed Mobility Overal bed mobility: Needs Assistance Bed Mobility: Supine to Sit;Sit to Supine     Supine to sit: Mod assist Sit to supine: Mod assist   General bed mobility comments: mod A with HOB elevated with assist for LE movement and uprighting trunk, pulls on therapist hands to scoot to EOB    Transfers Overall transfer level: Needs assistance Equipment used: Rolling walker (2 wheeled) Transfers: Sit to/from Stand Sit to Stand: Mod assist;Max assist         General transfer comment: transfer to standing with RW x 5, decreased stance time with fatigue, max stand time of about 30 seconds with max assist  Ambulation/Gait                  Stairs             Wheelchair Mobility    Modified Rankin (Stroke Patients Only)       Balance Overall balance assessment: Needs assistance Sitting-balance support: Feet supported Sitting balance-Leahy Scale: Fair Sitting balance - Comments: seated EOB   Standing balance support: During functional activity;Bilateral upper extremity supported Standing balance-Leahy Scale: Zero Standing balance comment: reliant on UE support                            Cognition Arousal/Alertness: Awake/alert Behavior During Therapy: WFL for tasks assessed/performed Overall Cognitive Status: Within Functional Limits for tasks assessed                                        Exercises General Exercises - Lower Extremity Ankle Circles/Pumps: AROM;Both;10 reps;Supine;Seated Quad Sets: AROM;Both;5 reps;Supine Long Arc Quad: AROM;Both;10 reps;Seated Heel Slides: AROM;Both;5 reps;Supine Hip Flexion/Marching: AAROM;Both;10 reps;Seated Other Exercises Other Exercises: hamstring isometrics in seated 5x 5 second holds bilateral    General Comments        Pertinent Vitals/Pain Pain Assessment: No/denies pain    Home Living                      Prior Function            PT Goals (  current goals can now be found in the care plan section) Acute Rehab PT Goals Patient Stated Goal: to return home PT Goal Formulation: With patient Time For Goal Achievement: 03/03/21 Potential to Achieve Goals: Good Progress towards PT goals: Progressing toward goals    Frequency    Min 3X/week      PT Plan Discharge plan needs to be updated    Co-evaluation              AM-PAC PT "6 Clicks" Mobility   Outcome Measure  Help needed turning from your back to your side while in a flat bed without using bedrails?: A Little Help needed moving from lying on your back to sitting on the side of a flat bed without using bedrails?: A  Lot Help needed moving to and from a bed to a chair (including a wheelchair)?: A Lot Help needed standing up from a chair using your arms (e.g., wheelchair or bedside chair)?: A Lot Help needed to walk in hospital room?: Total Help needed climbing 3-5 steps with a railing? : Total 6 Click Score: 11    End of Session Equipment Utilized During Treatment: Gait belt Activity Tolerance: Patient limited by fatigue Patient left: with call bell/phone within reach;in bed;with family/visitor present;with bed alarm set Nurse Communication: Mobility status PT Visit Diagnosis: Unsteadiness on feet (R26.81);Other abnormalities of gait and mobility (R26.89);Muscle weakness (generalized) (M62.81)     Time: 8250-0370 PT Time Calculation (min) (ACUTE ONLY): 24 min  Charges:  $Therapeutic Exercise: 8-22 mins $Therapeutic Activity: 8-22 mins                     3:22 PM, 02/20/21 Mearl Latin PT, DPT Physical Therapist at [facility]

## 2021-02-20 NOTE — Progress Notes (Addendum)
PROGRESS NOTE  Alexa Nichols [id number] DOB: 1946-10-03 DOA: 02/16/2021 PCP: [name], MD  Brief History: 75 y.o.female,with history of seasonal duties, lobular carcinoma of left breast, GERD, COPD, and more presents to ED with a chief complaint of leg weakness. Patient reports that she was hospitalized 2 weeks ago for GI bleed and was transfused a couple units. Since she left the hospital she has had pain in her lower back that she attributed to the awkward bed.She stated her back pain has been chronic for which she takes hydrocodone. She stated that her back pain was really not much worse than usual. However, she began noting progressive bilateral leg weakness starting 02/12/21. She saw her PCP on 02/13/21 who ordered PT, but her leg weakness continued to progress to the point where she was not able to bear weight. She denied any f/c, cp, sob, n/v/d or radicular symptoms. Because of progressive leg weakness, she presented for further evaluation.   In the ED Temp 98, heart rate 78-91, respiratory rate 16-20, blood pressure 154/62, satting at 98% Troponin normal at 4 and 5 UA is not indicative of UTI CT head shows no acute findings EKG shows a heart rate of 87, sinus rhythm, QTC 493 Mild leukocytosis of 11.8, hemoglobin 11.9 Telemetry neuro was initially supposed to consult, but they are too backed up so the plan is for inpatient consult in the a.m.  Assessment/Plan: Acute Inflammatory Demyelinating Polyneuropathy (AIDP) -IVIG started 5/17 evening -plan D#4 of 5 days IVIG -02/18/21 CSF consistent with albumincytologic dissociation -PT eval>>SNF -stable on RA without and sob -incentive spirometry -leg weakness about same as yesterday  Chronic back pain -continue oxycodone -MRI T +L spine without cord impingement -MRI brain neg for acute finding except incidental meningioma  COPD -stable on RA -continue Breo, singulair, incruse  GI bleed hx -due  to gastric and duodenal AVMs -s/p endoscopy 02/05/21 -Hgb stable  Depression -continue sertaline  Hyperlipidemia -continue statin      Status is: Inpatient  Remains inpatient appropriate because:IV treatments appropriate due to intensity of illness or inability to take PO   Dispo: The patient is from:Home Anticipated d/c is [id number] Patient currently is not medically stable to d/c. Difficult to place patient No        Family Communication:NoFamily at bedside  Consultants:neurology  Code Status: FULL   DVT Prophylaxis: [place] Heparin   Procedures: As Listed in Progress Note Above  Antibiotics: None    Subjective: Patient denies fevers, chills, headache, chest pain, dyspnea, nausea, vomiting, diarrhea, abdominal pain, dysuria, hematuria, hematochezia, and melena.   Objective: Vitals:   02/19/21 2217 02/20/21 0416 02/20/21 0756 02/20/21 1521  BP: 125/80 123/73  117/79  Pulse: 88 73  91  Resp: 16 16  17   Temp: 99.2 F (37.3 C) 98.4 F (36.9 C)  98.8 F (37.1 C)  TempSrc: Oral   Oral  SpO2: 96% 97% 96% 96%  Weight:      Height:        Intake/Output Summary (Last 24 hours) at 02/20/2021 1643 Last data filed at 02/20/2021 1542 Gross per 24 hour  Intake 480 ml  Output 1750 ml  Net -1270 ml   Weight change:  Exam:   General:  Pt is alert, follows commands appropriately, not in acute distress  HEENT: No icterus, No thrush, No neck mass, [place]/AT  Cardiovascular: RRR, S1/S2, no rubs, no gallops  Respiratory: CTA bilaterally, no wheezing, no crackles, no  rhonchi  Abdomen: Soft/+BS, non tender, non distended, no guarding  Extremities: No edema, No lymphangitis, No petechiae, No rashes, no synovitis   Data Reviewed: I have personally reviewed following labs and imaging studies Basic Metabolic Panel: Recent Labs  Lab 02/16/21 1421 02/17/21 0614 02/18/21 0556  NA 138 139 137   K 4.5 4.3 4.3  CL 101 102 100  CO2 26 27 28   GLUCOSE 117* 104* 99  BUN 14 14 14   CREATININE 0.59 0.46 0.38*  CALCIUM 10.0 9.0 9.0   Liver Function Tests: Recent Labs  Lab 02/17/21 0614  AST 22  ALT 18  ALKPHOS 88  BILITOT 0.4  PROT 7.2  ALBUMIN 3.9   No results for input(s): LIPASE, AMYLASE in the last 168 hours. No results for input(s): AMMONIA in the last 168 hours. Coagulation Profile: No results for input(s): INR, PROTIME in the last 168 hours. CBC: Recent Labs  Lab 02/16/21 1421 02/17/21 0614 02/18/21 0556  WBC 11.8* 8.8 8.9  NEUTROABS 7.7  --   --   HGB 11.9* 11.0* 11.0*  HCT 40.0 36.2 37.0  MCV 92.2 90.7 92.7  PLT 406* 342 312   Cardiac Enzymes: Recent Labs  Lab 02/17/21 2026  CKTOTAL 45  CKMB 1.3   BNP: Invalid input(s): POCBNP CBG: No results for input(s): GLUCAP in the last 168 hours. HbA1C: No results for input(s): HGBA1C in the last 72 hours. Urine analysis:    Component Value Date/Time   COLORURINE STRAW (A) 02/16/2021 1600   APPEARANCEUR CLEAR 02/16/2021 1600   LABSPEC 1.008 02/16/2021 1600   PHURINE 5.0 02/16/2021 1600   GLUCOSEU NEGATIVE 02/16/2021 1600   HGBUR NEGATIVE 02/16/2021 1600   BILIRUBINUR NEGATIVE 02/16/2021 1600   KETONESUR NEGATIVE 02/16/2021 1600   PROTEINUR NEGATIVE 02/16/2021 1600   NITRITE NEGATIVE 02/16/2021 1600   LEUKOCYTESUR NEGATIVE 02/16/2021 1600   Sepsis Labs: @LABRCNTIP (procalcitonin:4,lacticidven:4) ) Recent Results (from the past 240 hour(s))  Resp Panel by RT-PCR (Flu A&B, Covid) Nasopharyngeal Swab     Status: None   Collection Time: 02/16/21  8:11 PM   Specimen: Nasopharyngeal Swab; Nasopharyngeal(NP) swabs in vial transport medium  Result Value Ref Range Status   SARS Coronavirus 2 by RT PCR NEGATIVE NEGATIVE Final    Comment: (NOTE) SARS-CoV-2 target nucleic acids are NOT DETECTED.  The SARS-CoV-2 RNA is generally detectable in upper respiratory specimens during the acute phase of  infection. The lowest concentration of SARS-CoV-2 viral copies this assay can detect is 138 copies/mL. A negative result does not preclude SARS-Cov-2 infection and should not be used as the sole basis for treatment or other patient management decisions. A negative result may occur with  improper specimen collection/handling, submission of specimen other than nasopharyngeal swab, presence of viral mutation(s) within the areas targeted by this assay, and inadequate number of viral copies(<138 copies/mL). A negative result must be combined with clinical observations, patient history, and epidemiological information. The expected result is Negative.  Fact Sheet for Patients:  EntrepreneurPulse.com.au  Fact Sheet for Healthcare Providers:  IncredibleEmployment.be  This test is no t yet approved or cleared by the [place] FDA and  has been authorized for detection and/or diagnosis of SARS-CoV-2 by FDA under an Emergency Use Authorization (EUA). This EUA will remain  in effect (meaning this test can be used) for the duration of the COVID-19 declaration under Section 564(b)(1) of the Act, 21 U.S.C.section 360bbb-3(b)(1), unless the authorization is terminated  or revoked sooner.       Influenza A  by PCR NEGATIVE NEGATIVE Final   Influenza B by PCR NEGATIVE NEGATIVE Final    Comment: (NOTE) The Xpert Xpress SARS-CoV-2/FLU/RSV plus assay is intended as an aid in the diagnosis of influenza from Nasopharyngeal swab specimens and should not be used as a sole basis for treatment. Nasal washings and aspirates are unacceptable for Xpert Xpress SARS-CoV-2/FLU/RSV testing.  Fact Sheet for Patients: EntrepreneurPulse.com.au  Fact Sheet for Healthcare Providers: IncredibleEmployment.be  This test is not yet approved or cleared by the [place] FDA and has been authorized for detection and/or diagnosis of SARS-CoV-2  by FDA under an Emergency Use Authorization (EUA). This EUA will remain in effect (meaning this test can be used) for the duration of the COVID-19 declaration under Section 564(b)(1) of the Act, 21 U.S.C. section 360bbb-3(b)(1), unless the authorization is terminated or revoked.  Performed at [facility], [address], [place], [place] [address]   CSF culture w Stat Gram Stain     Status: None (Preliminary result)   Collection Time: 02/18/21 10:51 AM   Specimen: CSF; Cerebrospinal Fluid  Result Value Ref Range Status   Specimen Description CSF  Final   Special Requests NONE  Final   Gram Stain   Final    NO ORGANISMS SEEN CYTOSPIN SMEAR WBC PRESENT, PREDOMINANTLY MONONUCLEAR Performed at [facility], [address], [place],  [address]    Culture PENDING  Incomplete   Report Status PENDING  Incomplete     Scheduled Meds: .  stroke: mapping our early stages of recovery book   Does not apply Once  . ezetimibe  10 mg Oral Daily  . fluticasone furoate-vilanterol  1 puff Inhalation Daily  . gabapentin  100 mg Oral TID  . montelukast  10 mg Oral QHS  . pantoprazole  40 mg Oral BID  . polyethylene glycol  17 g Oral Daily  . sertraline  25 mg Oral Daily  . umeclidinium bromide  1 puff Inhalation Daily   Continuous Infusions: . IMMUNE GLOBULIN 10% (HUMAN) IV - For Fluid Restriction Only Stopped (02/19/21 2217)    Procedures/Studies: CT Head Wo Contrast  Result Date: 02/16/2021 CLINICAL DATA:  Rule out stroke.  Bilateral leg weakness for 2 weeks EXAM: CT HEAD WITHOUT CONTRAST TECHNIQUE: Contiguous axial images were obtained from the base of the skull through the vertex without intravenous contrast. COMPARISON:  None. FINDINGS: Brain: No evidence of acute infarction, hemorrhage, hydrocephalus, extra-axial collection or mass lesion/mass effect. Vascular: No hyperdense vessel or unexpected calcification. Skull: Normal. Negative for fracture or focal lesion. Sinuses/Orbits:  Status post bilateral maxillary sinus median antrectomies. Moderate diffuse mucosal thickening noted involving the maxillary sinuses, sphenoid sinuses and ethmoid air cells. Mastoid air cells appear clear. Other: None. IMPRESSION: 1. No acute intracranial abnormalities. 2. Chronic sinus inflammation. Electronically Signed   By: Kerby Moors M.D.   On: 02/16/2021 16:44   MR BRAIN WO CONTRAST  Result Date: 02/17/2021 CLINICAL DATA:  Neuro deficit, acute stroke suspected. Difficulty walking. EXAM: MRI HEAD WITHOUT CONTRAST TECHNIQUE: Multiplanar, multiecho pulse sequences of the brain and surrounding structures were obtained without intravenous contrast. COMPARISON:  CT head Feb 16 2021. FINDINGS: Brain: No acute infarction, hemorrhage, hydrocephalus, or extra-axial fluid collection. Mild for age T2/FLAIR hyperintensities within the white matter, most likely related to chronic microvascular ischemic disease. Incidental 11 mm calcified extra-axial, dural-based lesion along the posterior right vertex (series 11, image 23), likely a meningioma. No mass effect or surrounding edema. Vascular: Major arterial flow voids are maintained at  the skull base. Skull and upper cervical spine: Normal marrow signal. Sinuses/Orbits: Evidence of prior endoscopic sinus surgery with bilateral maxillary antrostomies and ethmoidectomies. Moderate pansinus mucosal thickening, greatest in the ethmoid air cells and sphenoid sinuses. Air-fluid level in the right sphenoid sinus. Other: Small right mastoid effusion. IMPRESSION: 1. No evidence of acute intracranial abnormality. Specifically, no acute infarct. 2. Incidental presumed 11 mm meningioma along the vertex without mass effect or surrounding edema. 3. Evidence of prior endoscopic sinus surgery with moderate paranasal sinus mucosal thickening, as detailed above. Electronically Signed   By: Margaretha Sheffield MD   On: 02/17/2021 09:58   MR THORACIC SPINE WO CONTRAST  Result Date:  02/17/2021 CLINICAL DATA:  Lumbar radiculopathy. EXAM: MRI THORACIC SPINE WITHOUT CONTRAST TECHNIQUE: Multiplanar, multisequence MR imaging of the thoracic spine was performed. No intravenous contrast was administered. COMPARISON:  None. FINDINGS: Alignment:  Exaggerated thoracic kyphosis. Vertebrae: No fracture, evidence of discitis, or bone lesion. Anterior vertebral body/disc space ankylosis from T3 through T5 and at T6-7. Cord: Normal cord signal. Small indentation on the anterior cord surface at the C6-7 and T8-9 Paraspinal and other soft tissues: Negative. Disc levels: Small posterior disc protrude at C6-7, C7-T1 and T1-2 causing small indentation on the thecal sac without significant spinal canal or neural foraminal stenosis. Small posterior disc protrusions at T6-7 and T8-9 causing small indentations on the anterior cord surface without cord signal abnormality, significant spinal canal stenosis or neural foraminal stenosis. No significant spinal canal or neural foraminal stenosis at the remaining thoracic levels. Right perineural cysts at T6-7 and T10-11 IMPRESSION: Exaggerated thoracic kyphosis and mild degenerative changes without high-grade spinal canal or neural foraminal stenosis at any level. Electronically Signed   By: Pedro Earls M.D.   On: 02/17/2021 17:09   MR LUMBAR SPINE WO CONTRAST  Result Date: 02/17/2021 CLINICAL DATA:  Lumbar radiculopathy, greater than 6 weeks. EXAM: MRI LUMBAR SPINE WITHOUT CONTRAST TECHNIQUE: Multiplanar, multisequence MR imaging of the lumbar spine was performed. No intravenous contrast was administered. COMPARISON:  CT abdomen/pelvis 02/04/2021. FINDINGS: Segmentation: 5 lumbar vertebrae. The caudal most well-formed intervertebral disc space is designated L5-S1 Alignment: Trace L1-L2 grade 1 retrolisthesis. Trace L3-L4 grade 1 retrolisthesis. Trace L5-S1 grade 1 anterolisthesis. Vertebrae: Vertebral body height is maintained. No significant marrow  edema or focal suspicious osseous lesion. L2 vertebral body hemangioma. Conus medullaris and cauda equina: Conus extends to the L2 level. No signal abnormality within the visualized distal spinal cord. Paraspinal and other soft tissues: Tiny T2 hyperintense lesion within the right kidney, too small to characterize but likely reflecting a cyst. Paraspinal soft tissues within normal limits. Disc levels: Mild disc degeneration at the L2-L3 through L5-S1 levels, greatest at L3-L4 and L5-S1. T12-L1: Imaged sagittally. Shallow disc bulge. No significant spinal canal or foraminal stenosis. L1-L2: No significant disc herniation or stenosis. L2-L3: Minimal disc bulge. No significant spinal canal or foraminal stenosis. L3-L4: Disc bulge. Superimposed shallow broad-based right subarticular to right foraminal disc protrusion. Mild relative right subarticular narrowing without frank nerve root impingement. Central canal patent. Minimal relative right neural foraminal narrowing without nerve root impingement. L4-L5: Minimal disc bulge. Minimal facet arthrosis. Mild ligamentum flavum hypertrophy on the right. No significant spinal canal or foraminal stenosis. L5-S1: Disc bulge. Mild endplate spurring. Facet arthrosis/ligamentum flavum hypertrophy. Bilateral subarticular narrowing (mild right, mild/moderate left) with some crowding of the left greater than right descending S1 nerve roots (series 19, image 25). Central canal patent. Mild relative bilateral neural foraminal narrowing. IMPRESSION:  Lumbar spondylosis, as outlined and with findings most notably as follows. At L5-S1, there is trace grade 1 anterolisthesis. Mild disc degeneration. Disc bulge. Mild endplate spurring. Facet arthrosis/ligamentum flavum hypertrophy. Bilateral subarticular narrowing (mild right, mild/moderate left) with some crowding of the left greater than right descending S1 nerve roots. Mild relative bilateral neural foraminal narrowing. At L3-L4, there is  trace retrolisthesis. Mild disc degeneration. Superimposed shallow broad-based right subarticular to right foraminal disc protrusion. Mild right subarticular narrowing without frank nerve root impingement. Minimal relative right neural foraminal narrowing without nerve root impingement. No significant spinal canal or neural foraminal stenosis at the remaining levels. Electronically Signed   By: Kellie Simmering DO   On: 02/17/2021 17:01   DG FL GUIDED LUMBAR PUNCTURE  Result Date: 02/18/2021 CLINICAL DATA:  Neurosarcoidosis. EXAM: DIAGNOSTIC LUMBAR PUNCTURE UNDER FLUOROSCOPIC GUIDANCE COMPARISON:  None. FLUOROSCOPY TIME:  Radiation Exposure Index (if provided by the fluoroscopic device): 22.7 mGy. Number of Acquired Spot Images: 2. PROCEDURE: Informed consent was obtained from the patient prior to the procedure, including potential complications of headache, allergy, and pain. With the patient prone, the lower back was prepped with Betadine. 1% Lidocaine was used for local anesthesia. Under fluoroscopic guidance, lumbar puncture was performed at the L3-4 level using a 20 gauge needle with return of clear CSF. Ten ml of CSF were obtained for laboratory studies. Needle was removed and appropriate dressing was applied. The patient tolerated the procedure well and there were no apparent complications. IMPRESSION: Under fluoroscopic guidance, successful lumbar puncture was performed for diagnostic purposes. Electronically Signed   By: Marijo Conception M.D.   On: 02/18/2021 11:41   CT Angio Abd/Pel w/ and/or w/o  Result Date: 02/04/2021 CLINICAL DATA:  Obscure GI bleed, transfusion dependent anemia. EXAM: CTA ABDOMEN AND PELVIS WITHOUT AND WITH CONTRAST TECHNIQUE: Multidetector CT imaging of the abdomen and pelvis was performed using the standard protocol during bolus administration of intravenous contrast. Multiplanar reconstructed images and MIPs were obtained and reviewed to evaluate the vascular anatomy. CONTRAST:   178mL OMNIPAQUE IOHEXOL 350 MG/ML SOLN COMPARISON:  12/07/2015 FINDINGS: VASCULAR Aorta: Mild calcified atheromatous plaque. No aneurysm, dissection, or stenosis. Celiac: Patent.  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 SMA: Patent without evidence of aneurysm, dissection, vasculitis or significant stenosis. Renals: Both renal arteries are patent without evidence of aneurysm, dissection, vasculitis, fibromuscular dysplasia or significant stenosis. IMA: Patent without evidence of aneurysm, dissection, vasculitis or significant stenosis. Inflow: Patent without evidence of aneurysm, dissection, vasculitis or significant stenosis. Proximal Outflow: Bilateral common femoral and visualized portions of the superficial and profunda femoral arteries are patent without evidence of aneurysm, dissection, vasculitis or significant stenosis. Veins: Patent hepatic veins, portal vein, SMV, splenic vein, bilateral renal veins, iliac venous system and IVC. No venous pathology identified. Review of the MIP images confirms the above findings. NON-VASCULAR Lower chest: No pleural or pericardial effusion. Minimal linear scarring in the lung bases. Hepatobiliary: No focal liver abnormality is seen. Status post cholecystectomy. No biliary dilatation. Pancreas: Unremarkable. No pancreatic ductal dilatation or surrounding inflammatory changes. Spleen: Normal in size without focal abnormality. Adrenals/Urinary Tract: Adrenal glands unremarkable. Normal renal enhancement. No hydronephrosis. Urinary bladder physiologically distended. Stomach/Bowel: Stomach is nondistended. 6.1 cm posterior gastric diverticulum (previously 4.9) containing fluid level and hyperdense material. No associated wall thickening nor inflammatory change. The small bowel is nondilated. Normal appendix. The colon is nondilated, unremarkable. No evidence of active extravasation. Lymphatic: No abdominal or pelvic adenopathy. Reproductive: Uterus and bilateral adnexa  are  unremarkable. Other: No ascites.  No free air. Musculoskeletal: Lower lumbar spondylitic changes. No fracture or worrisome bone lesion. IMPRESSION: 1. No evidence of active extravasation into the bowel. 2. Slight interval enlargement of posterior gastric diverticulum without adjacent inflammatory/edematous change. Electronically Signed   By: Lucrezia Europe M.D.   On: 02/04/2021 13:40    Orson Eva, DO  [facility]  If 7PM-7AM, please contact night-coverage [email or web address] Password [facility] 02/20/2021, 4:43 PM   LOS: 2 days

## 2021-02-20 NOTE — Progress Notes (Signed)
[place] A. Merlene Laughter, MD     [email or web address]          Alexa Nichols is an 75 y.o. female.   ASSESSMENT/PLAN: 1.  Progressive acute/subacute leg weakness with areflexia most consistent with acute inflammatory demyelinating poly-radicularneuritis/Guillain-Barr syndrome.  The patient will be treated as such with immunoglobulins per the typical protocol. Pulmonary function test will also be obtained to assess her baseline and probably should be done daily.  Consider placing the patient in ICU if the [facility] gets below 43mL per kilogram.  Patient should be continued on telemetry for now.  PT OT will be required. 2.  Vitamin B12 deficiency which will be replaced 3.  Chronic low back pain 4.  Chronic statin use: Statin will be discontinued for a few months in case she has statin induced myopathy either the acute or HMG coenzyme reductase antibody mediated statin myopathy.    No changes are reported. Her strength is essentially unchanged. No dyspnea or dysarthria are reported. She should complete her 5 day course of immunoglobulins on tomorrow.   GENERAL: This a pleasant female who is doing well at this time.  HEENT: Neck is supple no trauma noted.  ABDOMEN: soft  EXTREMITIES: No edema   BACK: Normal  SKIN: Normal by inspection.    MENTAL STATUS: Alert and oriented. Speech, language and cognition are generally intact. Judgment and insight normal.   CRANIAL NERVES: Pupils are equal, round and reactive to light and accomodation; extra ocular movements are full, there is no significant nystagmus; visual fields are full; upper and lower facial muscles are normal in strength and symmetric, there is no flattening of the nasolabial folds; tongue is midline; uvula is midline; shoulder elevation is normal.  MOTOR: Deltoids are 4+/5.  Handgrip 5.  Bulk and tone are normal throughout.  Hip flexion shows marked weakness 2/5.  Dorsiflexion 4/5 bilaterally.  COORDINATION: Left  finger to nose is normal, right finger to nose is normal, No rest tremor; no intention tremor; no postural tremor; no bradykinesia.  REFLEXES: Deep tendon reflexes are symmetrical and normal in the upper extremities but absent in the legs. Plantar reflexes are flexor bilaterally.   SENSATION: This is slightly diminished in the feet bilaterally.      Blood pressure 117/79, pulse 91, temperature 98.8 F (37.1 C), temperature source Oral, resp. rate 17, height 4\' 10"  (1.473 m), weight 62.6 kg, SpO2 96 %.  Past Medical History:  Diagnosis Date  . Asthma   . Chronic low back pain   . COPD (chronic obstructive pulmonary disease) ([place])   . Depression   . GERD (gastroesophageal reflux disease)   . Heart murmur   . Lobular carcinoma of left breast ([place]) 1997   in situ  . Other and unspecified hyperlipidemia   . Seasonal allergies     Past Surgical History:  Procedure Laterality Date  . BIOPSY  12/03/2020   Procedure: BIOPSY;  Surgeon: Rogene Houston, MD;  [facility];  Service: Endoscopy;;  gastric polyps biopsies;  . BREAST SURGERY Left    lumpectomy   . CESAREAN SECTION  1986  . CHOLECYSTECTOMY    . COLONOSCOPY WITH PROPOFOL N/A 12/03/2020    two 4-7 mm polyps in cecum, one small polyp in cecum, one 5 mm polyp, sigmoid diverticulosis, path with tubular adenomas  . ESOPHAGOGASTRODUODENOSCOPY (EGD) WITH PROPOFOL N/A 12/03/2020   normal esophagus, multiple gastric polyps s/p biopsy, small paraesophageal hernia, normal duodenum. Fundic gland polyps.  . ESOPHAGOGASTRODUODENOSCOPY (EGD)  WITH PROPOFOL N/A 02/05/2021   Procedure: ESOPHAGOGASTRODUODENOSCOPY (EGD) WITH PROPOFOL;  Surgeon: [name], MD;  Location: [facility];  Service: Endoscopy;  Laterality: N/A;  . GIVENS CAPSULE STUDY N/A 12/16/2020   normal small bowel capsule but rapid transit time of 21 minutes. Recommended to resume alendronate.   Marland Kitchen GIVENS CAPSULE STUDY N/A 02/04/2021   Procedure: GIVENS CAPSULE STUDY;   Surgeon: Rogene Houston, MD;  [facility]: [facility];  Service: Endoscopy;  Laterality: N/A;  . HOT HEMOSTASIS  02/05/2021   Procedure: HOT HEMOSTASIS (ARGON PLASMA COAGULATION/BICAP);  Surgeon: [name], MD;  Location: [facility];  Service: Endoscopy;;  . TONSILLECTOMY     age 25    Family History  Problem Relation Age of Onset  . Osteoporosis Mother   . Other Father        head trauma  . Breast cancer Sister        X's 2 sisters    Social History:  reports that she quit smoking about 37 years ago. Her smoking use included cigarettes. She started smoking about 56 years ago. She has a 10.00 pack-year smoking history. She has never used smokeless tobacco. She reports that she does not drink alcohol and does not use drugs.  Allergies:  Allergies  Allergen Reactions  . Aspirin     samter's syndrome  . Codeine Itching  . Sulfa Antibiotics     Unknown reaction  . Other Rash    gel filled fentanyl patch, adhesive on that patch caused ithcing    Medications: Prior to Admission medications   Medication Sig Start Date End Date Taking? Authorizing Provider  acetaminophen (TYLENOL) 500 MG tablet Take 500 mg by mouth every 6 (six) hours as needed for moderate pain or headache.   Yes [provider]  ALPRAZolam Duanne Moron) 0.5 MG tablet Take 0.25 mg by mouth daily as needed for anxiety.   Yes [provider]  fentaNYL (DURAGESIC - DOSED MCG/HR) 25 MCG/HR patch Place 25 mcg onto the skin See admin instructions. Place 25 mcg onto the skin for 72 hours then remove as needed for pain   Yes [provider]  ferrous sulfate 325 (65 FE) MG tablet Take 1 tablet (325 mg total) by mouth daily with breakfast. 02/08/21  Yes [name], [name], MD  HYDROcodone-acetaminophen (NORCO/VICODIN) 5-325 MG per tablet Take 1 tablet by mouth 2 (two) times daily as needed for moderate pain.    Yes [provider]  montelukast (SINGULAIR) 10 MG tablet Take 10 mg by  mouth at bedtime.   Yes [provider]  Multiple Vitamins-Minerals (PRESERVISION AREDS 2) CAPS Take 1 capsule by mouth in the morning and at bedtime.   Yes [provider]  pantoprazole (PROTONIX) 40 MG tablet Take 1 tablet (40 mg total) by mouth 2 (two) times daily. 02/07/21 05/08/21 Yes Manuella Ghazi, Pratik D, DO  Pediatric Multivitamins-Iron [facility] COMPLETE) 18 MG CHEW Chew 1 tablet by mouth 2 (two) times daily. 01/27/21  Yes Rehman, Mechele Dawley, MD  sertraline (ZOLOFT) 50 MG tablet Take 25 mg by mouth daily. 01/26/16  Yes [provider]  simvastatin (ZOCOR) 20 MG tablet Take 20 mg by mouth daily.   Yes [provider]  tiZANidine (ZANAFLEX) 4 MG tablet Take 2 mg by mouth at bedtime as needed for muscle spasms.   Yes [provider]  TRELEGY ELLIPTA 200-62.5-25 MCG/INH AEPB Inhale 1 puff into the lungs daily. 12/21/20  Yes [provider]  Scheduled Meds: .  stroke: mapping our early stages of recovery book   Does not apply Once  . ezetimibe  10 mg Oral Daily  . fluticasone furoate-vilanterol  1 puff Inhalation Daily  . gabapentin  100 mg Oral TID  . montelukast  10 mg Oral QHS  . pantoprazole  40 mg Oral BID  . polyethylene glycol  17 g Oral Daily  . sertraline  25 mg Oral Daily  . umeclidinium bromide  1 puff Inhalation Daily   Continuous Infusions: . IMMUNE GLOBULIN 10% (HUMAN) IV - For Fluid Restriction Only Stopped (02/19/21 2217)   PRN Meds:.acetaminophen **OR** acetaminophen (TYLENOL) oral liquid 160 mg/5 mL **OR** acetaminophen, ALPRAZolam, ondansetron (ZOFRAN) IV, oxyCODONE     No results found for this or any previous visit (from the past 48 hour(s)).  Studies/Results:  BRAIN MRI  FINDINGS: Brain: No acute infarction, hemorrhage, hydrocephalus, or extra-axial fluid collection. Mild for age T2/FLAIR hyperintensities within the white matter, most likely related to chronic microvascular ischemic disease. Incidental 11 mm  calcified extra-axial, dural-based lesion along the posterior right vertex (series 11, image 23), likely a meningioma. No mass effect or surrounding edema.  Vascular: Major arterial flow voids are maintained at the skull base.  Skull and upper cervical spine: Normal marrow signal.  Sinuses/Orbits: Evidence of prior endoscopic sinus surgery with bilateral maxillary antrostomies and ethmoidectomies. Moderate pansinus mucosal thickening, greatest in the ethmoid air cells and sphenoid sinuses. Air-fluid level in the right sphenoid sinus.  Other: Small right mastoid effusion.  IMPRESSION: 1. No evidence of acute intracranial abnormality. Specifically, no acute infarct. 2. Incidental presumed 11 mm meningioma along the vertex without mass effect or surrounding edema. 3. Evidence of prior endoscopic sinus surgery with moderate paranasal sinus mucosal thickening, as detailed above.    L SPINE MRI IMPRESSION: Lumbar spondylosis, as outlined and with findings most notably as follows.  At L5-S1, there is trace grade 1 anterolisthesis. Mild disc degeneration. Disc bulge. Mild endplate spurring. Facet arthrosis/ligamentum flavum hypertrophy. Bilateral subarticular narrowing (mild right, mild/moderate left) with some crowding of the left greater than right descending S1 nerve roots. Mild relative bilateral neural foraminal narrowing.  At L3-L4, there is trace retrolisthesis. Mild disc degeneration. Superimposed shallow broad-based right subarticular to right foraminal disc protrusion. Mild right subarticular narrowing without frank nerve root impingement. Minimal relative right neural foraminal narrowing without nerve root impingement.  No significant spinal canal or neural foraminal stenosis at the remaining levels.    T SPINE MRIIMPRESSION: Exaggerated thoracic kyphosis and mild degenerative changes without high-grade spinal canal or neural foraminal stenosis at any  level.   BRAIN MRI reviewed in person no stroke; hemorrhage; minimal PVWD;     Narda Fundora A. Merlene Laughter, M.D.  Diplomate, [employer] of Psychiatry and Neurology ( Neurology). 02/20/2021, 4:25 PM

## 2021-02-21 DIAGNOSIS — G610 Guillain-Barre syndrome: Secondary | ICD-10-CM | POA: Diagnosis not present

## 2021-02-21 DIAGNOSIS — R29898 Other symptoms and signs involving the musculoskeletal system: Secondary | ICD-10-CM | POA: Diagnosis not present

## 2021-02-21 LAB — CBC
HCT: 36.5 % (ref 36.0–46.0)
Hemoglobin: 11.1 g/dL — ABNORMAL LOW (ref 12.0–15.0)
MCH: 27.8 pg (ref 26.0–34.0)
MCHC: 30.4 g/dL (ref 30.0–36.0)
MCV: 91.3 fL (ref 80.0–100.0)
Platelets: 247 10*3/uL (ref 150–400)
RBC: 4 MIL/uL (ref 3.87–5.11)
RDW: 15.4 % (ref 11.5–15.5)
WBC: 5.4 10*3/uL (ref 4.0–10.5)
nRBC: 0 % (ref 0.0–0.2)

## 2021-02-21 LAB — COMPREHENSIVE METABOLIC PANEL
ALT: 32 U/L (ref 0–44)
AST: 34 U/L (ref 15–41)
Albumin: 3.4 g/dL — ABNORMAL LOW (ref 3.5–5.0)
Alkaline Phosphatase: 85 U/L (ref 38–126)
Anion gap: 8 (ref 5–15)
BUN: 14 mg/dL (ref 8–23)
CO2: 26 mmol/L (ref 22–32)
Calcium: 9.2 mg/dL (ref 8.9–10.3)
Chloride: 100 mmol/L (ref 98–111)
Creatinine, Ser: 0.46 mg/dL (ref 0.44–1.00)
GFR, Estimated: >60 mL/min (ref >60)
Glucose, Bld: 106 mg/dL — ABNORMAL HIGH (ref 70–99)
Potassium: 4 mmol/L (ref 3.5–5.1)
Sodium: 134 mmol/L — ABNORMAL LOW (ref 135–145)
Total Bilirubin: 0.2 mg/dL — ABNORMAL LOW (ref 0.3–1.2)
Total Protein: 9.5 g/dL — ABNORMAL HIGH (ref 6.5–8.1)

## 2021-02-21 LAB — CK: Total CK: 32 U/L — ABNORMAL LOW (ref 38–234)

## 2021-02-21 LAB — MAGNESIUM: Magnesium: 1.9 mg/dL (ref 1.7–2.4)

## 2021-02-21 MED ORDER — VITAMIN B-12 100 MCG PO TABS
500.0000 ug | ORAL_TABLET | Freq: Every day | ORAL | Status: DC
  Administered 2021-02-22 – 2021-02-23 (×2): 500 ug via ORAL
  Filled 2021-02-21 (×2): qty 5

## 2021-02-21 MED ORDER — CYANOCOBALAMIN 1000 MCG/ML IJ SOLN
1000.0000 ug | Freq: Once | INTRAMUSCULAR | Status: AC
  Administered 2021-02-21: 1000 ug via INTRAMUSCULAR
  Filled 2021-02-21: qty 1

## 2021-02-21 MED ORDER — VITAMIN B-12 100 MCG PO TABS: 500.0000 ug | ORAL_TABLET | Freq: Every day | ORAL | Status: DC

## 2021-02-21 NOTE — Progress Notes (Signed)
PROGRESS NOTE  Alexa Nichols [id number] DOB: 12-Oct-1945 DOA: 02/16/2021 PCP: [name], MD  Brief History: 75 y.o.female,with history of seasonal duties, lobular carcinoma of left breast, GERD, COPD, and more presents to ED with a chief complaint of leg weakness. Patient reports that she was hospitalized 2 weeks ago for GI bleed and was transfused a couple units. Since she left the hospital she has had pain in her lower back that she attributed to the awkward bed.She stated her back pain has been chronic for which she takes hydrocodone. She stated that her back pain was really not much worse than usual. However, she began noting progressive bilateral leg weakness starting 02/12/21. She saw her PCP on 02/13/21 who ordered PT, but her leg weakness continued to progress to the point where she was not able to bear weight. She denied any f/c, cp, sob, n/v/d or radicular symptoms. Because of progressive leg weakness, she presented for further evaluation.   In the ED Temp 98, heart rate 78-91, respiratory rate 16-20, blood pressure 154/62, satting at 98% Troponin normal at 4 and 5 UA is not indicative of UTI CT head shows no acute findings EKG shows a heart rate of 87, sinus rhythm, QTC 493 Mild leukocytosis of 11.8, hemoglobin 11.9 Telemetry neuro was initially supposed to consult, but they are too backed up so the plan is for inpatient consult in the a.m.  Assessment/Plan: Acute Inflammatory Demyelinating Polyneuropathy (AIDP) -IVIG started 5/17 evening -planD#5 of5 days IVIG -02/18/21 CSF consistent with albumincytologic dissociation -PT eval>>SNF -stable on RA without and sob -incentive spirometry -leg weakness about same as yesterday  Chronic back pain -continueoxycodone -MRI T +L spine without cord impingement -MRI brain neg for acute finding except incidental meningioma  COPD -stable on RA -continue Breo, singulair, incruse  GI bleed hx -due  to gastric and duodenal AVMs -s/p endoscopy 02/05/21 -Hgb stable  Depression -continue sertaline  Hyperlipidemia -continue statin      Status is: Inpatient  Remains inpatient appropriate because:IV treatments appropriate due to intensity of illness or inability to take PO   Dispo: The patient is from:Home Anticipated d/c is [id number] Patient currently is not medically stable to d/c. Difficult to place patient No        Family Communication:NoFamily at bedside  Consultants:neurology  Code Status: FULL   DVT Prophylaxis: [place] Heparin   Procedures: As Listed in Progress Note Above  Antibiotics: None     Subjective: Patient states leg weakness is about the same.  Denies f/c, cp, sob, headache, arm weakness, n/v/d  Objective: Vitals:   02/21/21 0044 02/21/21 0515 02/21/21 0807 02/21/21 1352  BP: 106/78 127/75  126/72  Pulse: 96 94  88  Resp: 18 18  18   Temp: 98.6 F (37 C) 98.3 F (36.8 C)  98.6 F (37 C)  TempSrc: Oral Oral  Oral  SpO2: 95% 95% 97% 97%  Weight:      Height:        Intake/Output Summary (Last 24 hours) at 02/21/2021 1705 Last data filed at 02/21/2021 1500 Gross per 24 hour  Intake 1528.95 ml  Output 1051 ml  Net 477.95 ml   Weight change:  Exam:   General:  Pt is alert, follows commands appropriately, not in acute distress  HEENT: No icterus, No thrush, No neck mass, [place]/AT  Cardiovascular: RRR, S1/S2, no rubs, no gallops  Respiratory: CTA bilaterally, no wheezing, no crackles, no rhonchi  Abdomen: Soft/+BS,  non tender, non distended, no guarding  Extremities: No edema, No lymphangitis, No petechiae, No rashes, no synovitis  Neuro:  CN II-XII intact, strength 3/5 inRLE, strength 3/5 LUE, LLE; sensation intact bilateral; no dysmetria; babinski equivocal     Data Reviewed: I have personally reviewed following labs and imaging studies Basic  Metabolic Panel: Recent Labs  Lab 02/16/21 1421 02/17/21 0614 02/18/21 0556 02/21/21 0432  NA 138 139 137 134*  K 4.5 4.3 4.3 4.0  CL 101 102 100 100  CO2 26 27 28 26   GLUCOSE 117* 104* 99 106*  BUN 14 14 14 14   CREATININE 0.59 0.46 0.38* 0.46  CALCIUM 10.0 9.0 9.0 9.2  MG  --   --   --  1.9   Liver Function Tests: Recent Labs  Lab 02/17/21 0614 02/21/21 0432  AST 22 34  ALT 18 32  ALKPHOS 88 85  BILITOT 0.4 0.2*  PROT 7.2 9.5*  ALBUMIN 3.9 3.4*   No results for input(s): LIPASE, AMYLASE in the last 168 hours. No results for input(s): AMMONIA in the last 168 hours. Coagulation Profile: No results for input(s): INR, PROTIME in the last 168 hours. CBC: Recent Labs  Lab 02/16/21 1421 02/17/21 0614 02/18/21 0556 02/21/21 0432  WBC 11.8* 8.8 8.9 5.4  NEUTROABS 7.7  --   --   --   HGB 11.9* 11.0* 11.0* 11.1*  HCT 40.0 36.2 37.0 36.5  MCV 92.2 90.7 92.7 91.3  PLT 406* 342 312 247   Cardiac Enzymes: Recent Labs  Lab 02/17/21 2026 02/21/21 0432  CKTOTAL 45 32*  CKMB 1.3  --    BNP: Invalid input(s): POCBNP CBG: No results for input(s): GLUCAP in the last 168 hours. HbA1C: No results for input(s): HGBA1C in the last 72 hours. Urine analysis:    Component Value Date/Time   COLORURINE STRAW (A) 02/16/2021 1600   APPEARANCEUR CLEAR 02/16/2021 1600   LABSPEC 1.008 02/16/2021 1600   PHURINE 5.0 02/16/2021 1600   GLUCOSEU NEGATIVE 02/16/2021 1600   HGBUR NEGATIVE 02/16/2021 1600   BILIRUBINUR NEGATIVE 02/16/2021 1600   KETONESUR NEGATIVE 02/16/2021 1600   PROTEINUR NEGATIVE 02/16/2021 1600   NITRITE NEGATIVE 02/16/2021 1600   LEUKOCYTESUR NEGATIVE 02/16/2021 1600   Sepsis Labs: @LABRCNTIP (procalcitonin:4,lacticidven:4) ) Recent Results (from the past 240 hour(s))  Resp Panel by RT-PCR (Flu A&B, Covid) Nasopharyngeal Swab     Status: None   Collection Time: 02/16/21  8:11 PM   Specimen: Nasopharyngeal Swab; Nasopharyngeal(NP) swabs in vial transport  medium  Result Value Ref Range Status   SARS Coronavirus 2 by RT PCR NEGATIVE NEGATIVE Final    Comment: (NOTE) SARS-CoV-2 target nucleic acids are NOT DETECTED.  The SARS-CoV-2 RNA is generally detectable in upper respiratory specimens during the acute phase of infection. The lowest concentration of SARS-CoV-2 viral copies this assay can detect is 138 copies/mL. A negative result does not preclude SARS-Cov-2 infection and should not be used as the sole basis for treatment or other patient management decisions. A negative result may occur with  improper specimen collection/handling, submission of specimen other than nasopharyngeal swab, presence of viral mutation(s) within the areas targeted by this assay, and inadequate number of viral copies(<138 copies/mL). A negative result must be combined with clinical observations, patient history, and epidemiological information. The expected result is Negative.  Fact Sheet for Patients:  EntrepreneurPulse.com.au  Fact Sheet for Healthcare Providers:  IncredibleEmployment.be  This test is no t yet approved or cleared by the [place] and  has been authorized for detection and/or diagnosis of SARS-CoV-2 by FDA under an Emergency Use Authorization (EUA). This EUA will remain  in effect (meaning this test can be used) for the duration of the COVID-19 declaration under Section 564(b)(1) of the Act, 21 U.S.C.section 360bbb-3(b)(1), unless the authorization is terminated  or revoked sooner.       Influenza A by PCR NEGATIVE NEGATIVE Final   Influenza B by PCR NEGATIVE NEGATIVE Final    Comment: (NOTE) The Xpert Xpress SARS-CoV-2/FLU/RSV plus assay is intended as an aid in the diagnosis of influenza from Nasopharyngeal swab specimens and should not be used as a sole basis for treatment. Nasal washings and aspirates are unacceptable for Xpert Xpress SARS-CoV-2/FLU/RSV testing.  Fact Sheet for  Patients: EntrepreneurPulse.com.au  Fact Sheet for Healthcare Providers: IncredibleEmployment.be  This test is not yet approved or cleared by the [place] FDA and has been authorized for detection and/or diagnosis of SARS-CoV-2 by FDA under an Emergency Use Authorization (EUA). This EUA will remain in effect (meaning this test can be used) for the duration of the COVID-19 declaration under Section 564(b)(1) of the Act, 21 U.S.C. section 360bbb-3(b)(1), unless the authorization is terminated or revoked.  Performed at [facility], [address] [place], [place] [address]   CSF culture w Stat Gram Stain     Status: None (Preliminary result)   Collection Time: 02/18/21 10:51 AM   Specimen: CSF; Cerebrospinal Fluid  Result Value Ref Range Status   Specimen Description CSF  Final   Special Requests NONE  Final   Gram Stain   Final    NO ORGANISMS SEEN CYTOSPIN SMEAR WBC PRESENT, PREDOMINANTLY MONONUCLEAR Performed at [facility], [address] [place], [place] [address]    Culture PENDING  Incomplete   Report Status PENDING  Incomplete     Scheduled Meds: .  stroke: mapping our early stages of recovery book   Does not apply Once  . ezetimibe  10 mg Oral Daily  . fluticasone furoate-vilanterol  1 puff Inhalation Daily  . gabapentin  100 mg Oral TID  . montelukast  10 mg Oral QHS  . pantoprazole  40 mg Oral BID  . polyethylene glycol  17 g Oral Daily  . sertraline  25 mg Oral Daily  . umeclidinium bromide  1 puff Inhalation Daily   Continuous Infusions: . IMMUNE GLOBULIN 10% (HUMAN) IV - For Fluid Restriction Only 0 g (02/19/21 2217)    Procedures/Studies: CT Head Wo Contrast  Result Date: 02/16/2021 CLINICAL DATA:  Rule out stroke.  Bilateral leg weakness for 2 weeks EXAM: CT HEAD WITHOUT CONTRAST TECHNIQUE: Contiguous axial images were obtained from the base of the skull through the vertex without intravenous contrast. COMPARISON:   None. FINDINGS: Brain: No evidence of acute infarction, hemorrhage, hydrocephalus, extra-axial collection or mass lesion/mass effect. Vascular: No hyperdense vessel or unexpected calcification. Skull: Normal. Negative for fracture or focal lesion. Sinuses/Orbits: Status post bilateral maxillary sinus median antrectomies. Moderate diffuse mucosal thickening noted involving the maxillary sinuses, sphenoid sinuses and ethmoid air cells. Mastoid air cells appear clear. Other: None. IMPRESSION: 1. No acute intracranial abnormalities. 2. Chronic sinus inflammation. Electronically Signed   By: Kerby Moors M.D.   On: 02/16/2021 16:44   MR BRAIN WO CONTRAST  Result Date: 02/17/2021 CLINICAL DATA:  Neuro deficit, acute stroke suspected. Difficulty walking. EXAM: MRI HEAD WITHOUT CONTRAST TECHNIQUE: Multiplanar, multiecho pulse sequences of the brain and surrounding structures were obtained without intravenous contrast. COMPARISON:  CT head  Feb 16 2021. FINDINGS: Brain: No acute infarction, hemorrhage, hydrocephalus, or extra-axial fluid collection. Mild for age T2/FLAIR hyperintensities within the white matter, most likely related to chronic microvascular ischemic disease. Incidental 11 mm calcified extra-axial, dural-based lesion along the posterior right vertex (series 11, image 23), likely a meningioma. No mass effect or surrounding edema. Vascular: Major arterial flow voids are maintained at the skull base. Skull and upper cervical spine: Normal marrow signal. Sinuses/Orbits: Evidence of prior endoscopic sinus surgery with bilateral maxillary antrostomies and ethmoidectomies. Moderate pansinus mucosal thickening, greatest in the ethmoid air cells and sphenoid sinuses. Air-fluid level in the right sphenoid sinus. Other: Small right mastoid effusion. IMPRESSION: 1. No evidence of acute intracranial abnormality. Specifically, no acute infarct. 2. Incidental presumed 11 mm meningioma along the vertex without mass  effect or surrounding edema. 3. Evidence of prior endoscopic sinus surgery with moderate paranasal sinus mucosal thickening, as detailed above. Electronically Signed   By: Margaretha Sheffield MD   On: 02/17/2021 09:58   MR THORACIC SPINE WO CONTRAST  Result Date: 02/17/2021 CLINICAL DATA:  Lumbar radiculopathy. EXAM: MRI THORACIC SPINE WITHOUT CONTRAST TECHNIQUE: Multiplanar, multisequence MR imaging of the thoracic spine was performed. No intravenous contrast was administered. COMPARISON:  None. FINDINGS: Alignment:  Exaggerated thoracic kyphosis. Vertebrae: No fracture, evidence of discitis, or bone lesion. Anterior vertebral body/disc space ankylosis from T3 through T5 and at T6-7. Cord: Normal cord signal. Small indentation on the anterior cord surface at the C6-7 and T8-9 Paraspinal and other soft tissues: Negative. Disc levels: Small posterior disc protrude at C6-7, C7-T1 and T1-2 causing small indentation on the thecal sac without significant spinal canal or neural foraminal stenosis. Small posterior disc protrusions at T6-7 and T8-9 causing small indentations on the anterior cord surface without cord signal abnormality, significant spinal canal stenosis or neural foraminal stenosis. No significant spinal canal or neural foraminal stenosis at the remaining thoracic levels. Right perineural cysts at T6-7 and T10-11 IMPRESSION: Exaggerated thoracic kyphosis and mild degenerative changes without high-grade spinal canal or neural foraminal stenosis at any level. Electronically Signed   By: Pedro Earls M.D.   On: 02/17/2021 17:09   MR LUMBAR SPINE WO CONTRAST  Result Date: 02/17/2021 CLINICAL DATA:  Lumbar radiculopathy, greater than 6 weeks. EXAM: MRI LUMBAR SPINE WITHOUT CONTRAST TECHNIQUE: Multiplanar, multisequence MR imaging of the lumbar spine was performed. No intravenous contrast was administered. COMPARISON:  CT abdomen/pelvis 02/04/2021. FINDINGS: Segmentation: 5 lumbar vertebrae.  The caudal most well-formed intervertebral disc space is designated L5-S1 Alignment: Trace L1-L2 grade 1 retrolisthesis. Trace L3-L4 grade 1 retrolisthesis. Trace L5-S1 grade 1 anterolisthesis. Vertebrae: Vertebral body height is maintained. No significant marrow edema or focal suspicious osseous lesion. L2 vertebral body hemangioma. Conus medullaris and cauda equina: Conus extends to the L2 level. No signal abnormality within the visualized distal spinal cord. Paraspinal and other soft tissues: Tiny T2 hyperintense lesion within the right kidney, too small to characterize but likely reflecting a cyst. Paraspinal soft tissues within normal limits. Disc levels: Mild disc degeneration at the L2-L3 through L5-S1 levels, greatest at L3-L4 and L5-S1. T12-L1: Imaged sagittally. Shallow disc bulge. No significant spinal canal or foraminal stenosis. L1-L2: No significant disc herniation or stenosis. L2-L3: Minimal disc bulge. No significant spinal canal or foraminal stenosis. L3-L4: Disc bulge. Superimposed shallow broad-based right subarticular to right foraminal disc protrusion. Mild relative right subarticular narrowing without frank nerve root impingement. Central canal patent. Minimal relative right neural foraminal narrowing without nerve root impingement. L4-L5:  Minimal disc bulge. Minimal facet arthrosis. Mild ligamentum flavum hypertrophy on the right. No significant spinal canal or foraminal stenosis. L5-S1: Disc bulge. Mild endplate spurring. Facet arthrosis/ligamentum flavum hypertrophy. Bilateral subarticular narrowing (mild right, mild/moderate left) with some crowding of the left greater than right descending S1 nerve roots (series 19, image 25). Central canal patent. Mild relative bilateral neural foraminal narrowing. IMPRESSION: Lumbar spondylosis, as outlined and with findings most notably as follows. At L5-S1, there is trace grade 1 anterolisthesis. Mild disc degeneration. Disc bulge. Mild endplate  spurring. Facet arthrosis/ligamentum flavum hypertrophy. Bilateral subarticular narrowing (mild right, mild/moderate left) with some crowding of the left greater than right descending S1 nerve roots. Mild relative bilateral neural foraminal narrowing. At L3-L4, there is trace retrolisthesis. Mild disc degeneration. Superimposed shallow broad-based right subarticular to right foraminal disc protrusion. Mild right subarticular narrowing without frank nerve root impingement. Minimal relative right neural foraminal narrowing without nerve root impingement. No significant spinal canal or neural foraminal stenosis at the remaining levels. Electronically Signed   By: Kellie Simmering DO   On: 02/17/2021 17:01   DG FL GUIDED LUMBAR PUNCTURE  Result Date: 02/18/2021 CLINICAL DATA:  Neurosarcoidosis. EXAM: DIAGNOSTIC LUMBAR PUNCTURE UNDER FLUOROSCOPIC GUIDANCE COMPARISON:  None. FLUOROSCOPY TIME:  Radiation Exposure Index (if provided by the fluoroscopic device): 22.7 mGy. Number of Acquired Spot Images: 2. PROCEDURE: Informed consent was obtained from the patient prior to the procedure, including potential complications of headache, allergy, and pain. With the patient prone, the lower back was prepped with Betadine. 1% Lidocaine was used for local anesthesia. Under fluoroscopic guidance, lumbar puncture was performed at the L3-4 level using a 20 gauge needle with return of clear CSF. Ten ml of CSF were obtained for laboratory studies. Needle was removed and appropriate dressing was applied. The patient tolerated the procedure well and there were no apparent complications. IMPRESSION: Under fluoroscopic guidance, successful lumbar puncture was performed for diagnostic purposes. Electronically Signed   By: Marijo Conception M.D.   On: 02/18/2021 11:41   CT Angio Abd/Pel w/ and/or w/o  Result Date: 02/04/2021 CLINICAL DATA:  Obscure GI bleed, transfusion dependent anemia. EXAM: CTA ABDOMEN AND PELVIS WITHOUT AND WITH CONTRAST  TECHNIQUE: Multidetector CT imaging of the abdomen and pelvis was performed using the standard protocol during bolus administration of intravenous contrast. Multiplanar reconstructed images and MIPs were obtained and reviewed to evaluate the vascular anatomy. CONTRAST:  151mL OMNIPAQUE IOHEXOL 350 MG/ML SOLN COMPARISON:  12/07/2015 FINDINGS: VASCULAR Aorta: Mild calcified atheromatous plaque. No aneurysm, dissection, or stenosis. Celiac: Patent.  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 SMA: Patent without evidence of aneurysm, dissection, vasculitis or significant stenosis. Renals: Both renal arteries are patent without evidence of aneurysm, dissection, vasculitis, fibromuscular dysplasia or significant stenosis. IMA: Patent without evidence of aneurysm, dissection, vasculitis or significant stenosis. Inflow: Patent without evidence of aneurysm, dissection, vasculitis or significant stenosis. Proximal Outflow: Bilateral common femoral and visualized portions of the superficial and profunda femoral arteries are patent without evidence of aneurysm, dissection, vasculitis or significant stenosis. Veins: Patent hepatic veins, portal vein, SMV, splenic vein, bilateral renal veins, iliac venous system and IVC. No venous pathology identified. Review of the MIP images confirms the above findings. NON-VASCULAR Lower chest: No pleural or pericardial effusion. Minimal linear scarring in the lung bases. Hepatobiliary: No focal liver abnormality is seen. Status post cholecystectomy. No biliary dilatation. Pancreas: Unremarkable. No pancreatic ductal dilatation or surrounding inflammatory changes. Spleen: Normal in size without focal abnormality. Adrenals/Urinary Tract: Adrenal glands  unremarkable. Normal renal enhancement. No hydronephrosis. Urinary bladder physiologically distended. Stomach/Bowel: Stomach is nondistended. 6.1 cm posterior gastric diverticulum (previously 4.9) containing fluid level and hyperdense  material. No associated wall thickening nor inflammatory change. The small bowel is nondilated. Normal appendix. The colon is nondilated, unremarkable. No evidence of active extravasation. Lymphatic: No abdominal or pelvic adenopathy. Reproductive: Uterus and bilateral adnexa are unremarkable. Other: No ascites.  No free air. Musculoskeletal: Lower lumbar spondylitic changes. No fracture or worrisome bone lesion. IMPRESSION: 1. No evidence of active extravasation into the bowel. 2. Slight interval enlargement of posterior gastric diverticulum without adjacent inflammatory/edematous change. Electronically Signed   By: Lucrezia Europe M.D.   On: 02/04/2021 13:40    Orson Eva, DO  [facility]  If 7PM-7AM, please contact night-coverage [email or web address] Password [email or web address] 02/21/2021, 5:05 PM   LOS: 3 days

## 2021-02-22 DIAGNOSIS — G610 Guillain-Barre syndrome: Secondary | ICD-10-CM | POA: Diagnosis not present

## 2021-02-22 MED ORDER — GABAPENTIN 300 MG PO CAPS
300.0000 mg | ORAL_CAPSULE | Freq: Three times a day (TID) | ORAL | Status: DC
  Administered 2021-02-22 – 2021-02-23 (×2): 300 mg via ORAL
  Filled 2021-02-22 (×3): qty 1

## 2021-02-22 MED ORDER — METHOCARBAMOL 500 MG PO TABS
500.0000 mg | ORAL_TABLET | Freq: Three times a day (TID) | ORAL | Status: DC
  Administered 2021-02-22: 500 mg via ORAL
  Filled 2021-02-22: qty 1

## 2021-02-22 MED ORDER — OXYCODONE HCL 5 MG PO TABS
10.0000 mg | ORAL_TABLET | Freq: Four times a day (QID) | ORAL | Status: DC | PRN
  Administered 2021-02-22 – 2021-02-23 (×3): 10 mg via ORAL
  Filled 2021-02-22 (×3): qty 2

## 2021-02-22 NOTE — Progress Notes (Signed)
PROGRESS NOTE  Alexa Nichols AST:[id number] DOB: Feb 02, 1946 DOA: 02/16/2021 PCP: [name], MD  Brief History: 75 y.o.female,with history of seasonal duties, lobular carcinoma of left breast, GERD, COPD, and more presents to ED with a chief complaint of leg weakness. Patient reports that she was hospitalized 2 weeks ago for GI bleed and was transfused a couple units. Since she left the hospital she has had pain in her lower back that she attributed to the awkward bed.She stated her back pain has been chronic for which she takes hydrocodone. She stated that her back pain was really not much worse than usual. However, she began noting progressive bilateral leg weakness starting 02/12/21. She saw her PCP on 02/13/21 who ordered PT, but her leg weakness continued to progress to the point where she was not able to bear weight. She denied any f/c, cp, sob, n/v/d or radicular symptoms. Because of progressive leg weakness, she presented for further evaluation.   In the ED Temp 98, heart rate 78-91, respiratory rate 16-20, blood pressure 154/62, satting at 98% Troponin normal at 4 and 5 UA is not indicative of UTI CT head shows no acute findings EKG shows a heart rate of 87, sinus rhythm, QTC 493 Mild leukocytosis of 11.8, hemoglobin 11.9 Telemetry neuro was initially supposed to consult, but they are too backed up so the plan is for inpatient consult in the a.m.  Assessment/Plan: Acute Inflammatory Demyelinating Polyneuropathy (AIDP) -IVIG started 5/17 evening -finished5 days IVIG 02/21/21 -02/18/21 CSF consistent with albumincytologic dissociation -PT eval>>SNF -stable on RA without and sob -incentive spirometry -leg weakness about same  -CK 32  Chronic back pain -continueoxycodone--increase to 10 mg q 6 hours prn -increase gabapentin to 300 mg q8 -MRI T +L spine without cord impingement -MRI brain neg for acute finding except incidental  meningioma  COPD -stable on RA -continue Breo, singulair, incruse  GI bleed hx -due to gastric and duodenal AVMs -s/p endoscopy 02/05/21 -Hgb stable  Depression -continue sertaline  Hyperlipidemia -continue statin      Status is: Inpatient  Remains inpatient appropriate because:IV treatments appropriate due to intensity of illness or inability to take PO   Dispo: The patient is from:Home Anticipated d/c is [id number] Patient currently is not medically stable to d/c. Difficult to place patient No        Family Communication:NoFamily at bedside  Consultants:neurology  Code Status: FULL   DVT Prophylaxis: [place] Heparin   Procedures: As Listed in Progress Note Above  Antibiotics: None      Subjective: Patient states leg weakness about same, no worse.  Denies f/c, cp, sob, n/v/d, abd pain  Objective: Vitals:   02/22/21 0013 02/22/21 0703 02/22/21 0732 02/22/21 1410  BP: 129/63 (!) 147/84  132/80  Pulse: 84 78  91  Resp: 18 18  16   Temp: 98.6 F (37 C) 97.8 F (36.6 C)  98.4 F (36.9 C)  TempSrc: Oral Oral  Oral  SpO2: 95% 96% 97% 96%  Weight:      Height:        Intake/Output Summary (Last 24 hours) at 02/22/2021 1704 Last data filed at 02/22/2021 1500 Gross per 24 hour  Intake 820 ml  Output 2500 ml  Net -1680 ml   Weight change: -2.4 kg Exam:   General:  Pt is alert, follows commands appropriately, not in acute distress  HEENT: No icterus, No thrush, No neck mass, [place]/AT  Cardiovascular: RRR, S1/S2, no  rubs, no gallops  Respiratory: CTA bilaterally, no wheezing, no crackles, no rhonchi  Abdomen: Soft/+BS, non tender, non distended, no guarding  Extremities: No edema, No lymphangitis, No petechiae, No rashes, no synovitis  Neuro:  CN II-XII intact, strength 3/5 in  RLE, strength 3/5  LLE; sensation intact bilateral; no dysmetria; babinski  equivocal     Data Reviewed: I have personally reviewed following labs and imaging studies Basic Metabolic Panel: Recent Labs  Lab 02/16/21 1421 02/17/21 0614 02/18/21 0556 02/21/21 0432  NA 138 139 137 134*  K 4.5 4.3 4.3 4.0  CL 101 102 100 100  CO2 26 27 28 26   GLUCOSE 117* 104* 99 106*  BUN 14 14 14 14   CREATININE 0.59 0.46 0.38* 0.46  CALCIUM 10.0 9.0 9.0 9.2  MG  --   --   --  1.9   Liver Function Tests: Recent Labs  Lab 02/17/21 0614 02/21/21 0432  AST 22 34  ALT 18 32  ALKPHOS 88 85  BILITOT 0.4 0.2*  PROT 7.2 9.5*  ALBUMIN 3.9 3.4*   No results for input(s): LIPASE, AMYLASE in the last 168 hours. No results for input(s): AMMONIA in the last 168 hours. Coagulation Profile: No results for input(s): INR, PROTIME in the last 168 hours. CBC: Recent Labs  Lab 02/16/21 1421 02/17/21 0614 02/18/21 0556 02/21/21 0432  WBC 11.8* 8.8 8.9 5.4  NEUTROABS 7.7  --   --   --   HGB 11.9* 11.0* 11.0* 11.1*  HCT 40.0 36.2 37.0 36.5  MCV 92.2 90.7 92.7 91.3  PLT 406* 342 312 247   Cardiac Enzymes: Recent Labs  Lab 02/17/21 2026 02/21/21 0432  CKTOTAL 45 32*  CKMB 1.3  --    BNP: Invalid input(s): POCBNP CBG: No results for input(s): GLUCAP in the last 168 hours. HbA1C: No results for input(s): HGBA1C in the last 72 hours. Urine analysis:    Component Value Date/Time   COLORURINE STRAW (A) 02/16/2021 1600   APPEARANCEUR CLEAR 02/16/2021 1600   LABSPEC 1.008 02/16/2021 1600   PHURINE 5.0 02/16/2021 1600   GLUCOSEU NEGATIVE 02/16/2021 1600   HGBUR NEGATIVE 02/16/2021 1600   BILIRUBINUR NEGATIVE 02/16/2021 1600   KETONESUR NEGATIVE 02/16/2021 1600   PROTEINUR NEGATIVE 02/16/2021 1600   NITRITE NEGATIVE 02/16/2021 1600   LEUKOCYTESUR NEGATIVE 02/16/2021 1600   Sepsis Labs: @LABRCNTIP (procalcitonin:4,lacticidven:4) ) Recent Results (from the past 240 hour(s))  Resp Panel by RT-PCR (Flu A&B, Covid) Nasopharyngeal Swab     Status: None   Collection  Time: 02/16/21  8:11 PM   Specimen: Nasopharyngeal Swab; Nasopharyngeal(NP) swabs in vial transport medium  Result Value Ref Range Status   SARS Coronavirus 2 by RT PCR NEGATIVE NEGATIVE Final    Comment: (NOTE) SARS-CoV-2 target nucleic acids are NOT DETECTED.  The SARS-CoV-2 RNA is generally detectable in upper respiratory specimens during the acute phase of infection. The lowest concentration of SARS-CoV-2 viral copies this assay can detect is 138 copies/mL. A negative result does not preclude SARS-Cov-2 infection and should not be used as the sole basis for treatment or other patient management decisions. A negative result may occur with  improper specimen collection/handling, submission of specimen other than nasopharyngeal swab, presence of viral mutation(s) within the areas targeted by this assay, and inadequate number of viral copies(<138 copies/mL). A negative result must be combined with clinical observations, patient history, and epidemiological information. The expected result is Negative.  Fact Sheet for Patients:  EntrepreneurPulse.com.au  Fact Sheet for Healthcare Providers:  IncredibleEmployment.be  This test is no t yet approved or cleared by the [place] and  has been authorized for detection and/or diagnosis of SARS-CoV-2 by FDA under an Emergency Use Authorization (EUA). This EUA will remain  in effect (meaning this test can be used) for the duration of the COVID-19 declaration under Section 564(b)(1) of the Act, 21 U.S.C.section 360bbb-3(b)(1), unless the authorization is terminated  or revoked sooner.       Influenza A by PCR NEGATIVE NEGATIVE Final   Influenza B by PCR NEGATIVE NEGATIVE Final    Comment: (NOTE) The Xpert Xpress SARS-CoV-2/FLU/RSV plus assay is intended as an aid in the diagnosis of influenza from Nasopharyngeal swab specimens and should not be used as a sole basis for treatment. Nasal washings  and aspirates are unacceptable for Xpert Xpress SARS-CoV-2/FLU/RSV testing.  Fact Sheet for Patients: EntrepreneurPulse.com.au  Fact Sheet for Healthcare Providers: IncredibleEmployment.be  This test is not yet approved or cleared by the [place] FDA and has been authorized for detection and/or diagnosis of SARS-CoV-2 by FDA under an Emergency Use Authorization (EUA). This EUA will remain in effect (meaning this test can be used) for the duration of the COVID-19 declaration under Section 564(b)(1) of the Act, 21 U.S.C. section 360bbb-3(b)(1), unless the authorization is terminated or revoked.  Performed at [facility], [address], [place], [place] [address]   CSF culture w Stat Gram Stain     Status: None (Preliminary result)   Collection Time: 02/18/21 10:51 AM   Specimen: CSF; Cerebrospinal Fluid  Result Value Ref Range Status   Specimen Description CSF  Final   Special Requests NONE  Final   Gram Stain   Final    NO ORGANISMS SEEN CYTOSPIN SMEAR WBC PRESENT, PREDOMINANTLY MONONUCLEAR Performed at [facility], [address] [place], [place] [address]    Culture PENDING  Incomplete   Report Status PENDING  Incomplete     Scheduled Meds: .  stroke: mapping our early stages of recovery book   Does not apply Once  . ezetimibe  10 mg Oral Daily  . fluticasone furoate-vilanterol  1 puff Inhalation Daily  . gabapentin  300 mg Oral TID  . montelukast  10 mg Oral QHS  . pantoprazole  40 mg Oral BID  . polyethylene glycol  17 g Oral Daily  . sertraline  25 mg Oral Daily  . umeclidinium bromide  1 puff Inhalation Daily  . vitamin B-12  500 mcg Oral Daily   Continuous Infusions:  Procedures/Studies: CT Head Wo Contrast  Result Date: 02/16/2021 CLINICAL DATA:  Rule out stroke.  Bilateral leg weakness for 2 weeks EXAM: CT HEAD WITHOUT CONTRAST TECHNIQUE: Contiguous axial images were obtained from the base of the skull through the  vertex without intravenous contrast. COMPARISON:  None. FINDINGS: Brain: No evidence of acute infarction, hemorrhage, hydrocephalus, extra-axial collection or mass lesion/mass effect. Vascular: No hyperdense vessel or unexpected calcification. Skull: Normal. Negative for fracture or focal lesion. Sinuses/Orbits: Status post bilateral maxillary sinus median antrectomies. Moderate diffuse mucosal thickening noted involving the maxillary sinuses, sphenoid sinuses and ethmoid air cells. Mastoid air cells appear clear. Other: None. IMPRESSION: 1. No acute intracranial abnormalities. 2. Chronic sinus inflammation. Electronically Signed   By: Kerby Moors M.D.   On: 02/16/2021 16:44   MR BRAIN WO CONTRAST  Result Date: 02/17/2021 CLINICAL DATA:  Neuro deficit, acute stroke suspected. Difficulty walking. EXAM: MRI HEAD WITHOUT CONTRAST TECHNIQUE: Multiplanar, multiecho pulse sequences of the brain and surrounding  structures were obtained without intravenous contrast. COMPARISON:  CT head Feb 16 2021. FINDINGS: Brain: No acute infarction, hemorrhage, hydrocephalus, or extra-axial fluid collection. Mild for age T2/FLAIR hyperintensities within the white matter, most likely related to chronic microvascular ischemic disease. Incidental 11 mm calcified extra-axial, dural-based lesion along the posterior right vertex (series 11, image 23), likely a meningioma. No mass effect or surrounding edema. Vascular: Major arterial flow voids are maintained at the skull base. Skull and upper cervical spine: Normal marrow signal. Sinuses/Orbits: Evidence of prior endoscopic sinus surgery with bilateral maxillary antrostomies and ethmoidectomies. Moderate pansinus mucosal thickening, greatest in the ethmoid air cells and sphenoid sinuses. Air-fluid level in the right sphenoid sinus. Other: Small right mastoid effusion. IMPRESSION: 1. No evidence of acute intracranial abnormality. Specifically, no acute infarct. 2. Incidental presumed 11  mm meningioma along the vertex without mass effect or surrounding edema. 3. Evidence of prior endoscopic sinus surgery with moderate paranasal sinus mucosal thickening, as detailed above. Electronically Signed   By: Margaretha Sheffield MD   On: 02/17/2021 09:58   MR THORACIC SPINE WO CONTRAST  Result Date: 02/17/2021 CLINICAL DATA:  Lumbar radiculopathy. EXAM: MRI THORACIC SPINE WITHOUT CONTRAST TECHNIQUE: Multiplanar, multisequence MR imaging of the thoracic spine was performed. No intravenous contrast was administered. COMPARISON:  None. FINDINGS: Alignment:  Exaggerated thoracic kyphosis. Vertebrae: No fracture, evidence of discitis, or bone lesion. Anterior vertebral body/disc space ankylosis from T3 through T5 and at T6-7. Cord: Normal cord signal. Small indentation on the anterior cord surface at the C6-7 and T8-9 Paraspinal and other soft tissues: Negative. Disc levels: Small posterior disc protrude at C6-7, C7-T1 and T1-2 causing small indentation on the thecal sac without significant spinal canal or neural foraminal stenosis. Small posterior disc protrusions at T6-7 and T8-9 causing small indentations on the anterior cord surface without cord signal abnormality, significant spinal canal stenosis or neural foraminal stenosis. No significant spinal canal or neural foraminal stenosis at the remaining thoracic levels. Right perineural cysts at T6-7 and T10-11 IMPRESSION: Exaggerated thoracic kyphosis and mild degenerative changes without high-grade spinal canal or neural foraminal stenosis at any level. Electronically Signed   By: Pedro Earls M.D.   On: 02/17/2021 17:09   MR LUMBAR SPINE WO CONTRAST  Result Date: 02/17/2021 CLINICAL DATA:  Lumbar radiculopathy, greater than 6 weeks. EXAM: MRI LUMBAR SPINE WITHOUT CONTRAST TECHNIQUE: Multiplanar, multisequence MR imaging of the lumbar spine was performed. No intravenous contrast was administered. COMPARISON:  CT abdomen/pelvis 02/04/2021.  FINDINGS: Segmentation: 5 lumbar vertebrae. The caudal most well-formed intervertebral disc space is designated L5-S1 Alignment: Trace L1-L2 grade 1 retrolisthesis. Trace L3-L4 grade 1 retrolisthesis. Trace L5-S1 grade 1 anterolisthesis. Vertebrae: Vertebral body height is maintained. No significant marrow edema or focal suspicious osseous lesion. L2 vertebral body hemangioma. Conus medullaris and cauda equina: Conus extends to the L2 level. No signal abnormality within the visualized distal spinal cord. Paraspinal and other soft tissues: Tiny T2 hyperintense lesion within the right kidney, too small to characterize but likely reflecting a cyst. Paraspinal soft tissues within normal limits. Disc levels: Mild disc degeneration at the L2-L3 through L5-S1 levels, greatest at L3-L4 and L5-S1. T12-L1: Imaged sagittally. Shallow disc bulge. No significant spinal canal or foraminal stenosis. L1-L2: No significant disc herniation or stenosis. L2-L3: Minimal disc bulge. No significant spinal canal or foraminal stenosis. L3-L4: Disc bulge. Superimposed shallow broad-based right subarticular to right foraminal disc protrusion. Mild relative right subarticular narrowing without frank nerve root impingement. Central canal patent. Minimal  relative right neural foraminal narrowing without nerve root impingement. L4-L5: Minimal disc bulge. Minimal facet arthrosis. Mild ligamentum flavum hypertrophy on the right. No significant spinal canal or foraminal stenosis. L5-S1: Disc bulge. Mild endplate spurring. Facet arthrosis/ligamentum flavum hypertrophy. Bilateral subarticular narrowing (mild right, mild/moderate left) with some crowding of the left greater than right descending S1 nerve roots (series 19, image 25). Central canal patent. Mild relative bilateral neural foraminal narrowing. IMPRESSION: Lumbar spondylosis, as outlined and with findings most notably as follows. At L5-S1, there is trace grade 1 anterolisthesis. Mild disc  degeneration. Disc bulge. Mild endplate spurring. Facet arthrosis/ligamentum flavum hypertrophy. Bilateral subarticular narrowing (mild right, mild/moderate left) with some crowding of the left greater than right descending S1 nerve roots. Mild relative bilateral neural foraminal narrowing. At L3-L4, there is trace retrolisthesis. Mild disc degeneration. Superimposed shallow broad-based right subarticular to right foraminal disc protrusion. Mild right subarticular narrowing without frank nerve root impingement. Minimal relative right neural foraminal narrowing without nerve root impingement. No significant spinal canal or neural foraminal stenosis at the remaining levels. Electronically Signed   By: Kellie Simmering DO   On: 02/17/2021 17:01   DG FL GUIDED LUMBAR PUNCTURE  Result Date: 02/18/2021 CLINICAL DATA:  Neurosarcoidosis. EXAM: DIAGNOSTIC LUMBAR PUNCTURE UNDER FLUOROSCOPIC GUIDANCE COMPARISON:  None. FLUOROSCOPY TIME:  Radiation Exposure Index (if provided by the fluoroscopic device): 22.7 mGy. Number of Acquired Spot Images: 2. PROCEDURE: Informed consent was obtained from the patient prior to the procedure, including potential complications of headache, allergy, and pain. With the patient prone, the lower back was prepped with Betadine. 1% Lidocaine was used for local anesthesia. Under fluoroscopic guidance, lumbar puncture was performed at the L3-4 level using a 20 gauge needle with return of clear CSF. Ten ml of CSF were obtained for laboratory studies. Needle was removed and appropriate dressing was applied. The patient tolerated the procedure well and there were no apparent complications. IMPRESSION: Under fluoroscopic guidance, successful lumbar puncture was performed for diagnostic purposes. Electronically Signed   By: Marijo Conception M.D.   On: 02/18/2021 11:41   CT Angio Abd/Pel w/ and/or w/o  Result Date: 02/04/2021 CLINICAL DATA:  Obscure GI bleed, transfusion dependent anemia. EXAM: CTA  ABDOMEN AND PELVIS WITHOUT AND WITH CONTRAST TECHNIQUE: Multidetector CT imaging of the abdomen and pelvis was performed using the standard protocol during bolus administration of intravenous contrast. Multiplanar reconstructed images and MIPs were obtained and reviewed to evaluate the vascular anatomy. CONTRAST:  166mL OMNIPAQUE IOHEXOL 350 MG/ML SOLN COMPARISON:  12/07/2015 FINDINGS: VASCULAR Aorta: Mild calcified atheromatous plaque. No aneurysm, dissection, or stenosis. Celiac: Patent.  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 SMA: Patent without evidence of aneurysm, dissection, vasculitis or significant stenosis. Renals: Both renal arteries are patent without evidence of aneurysm, dissection, vasculitis, fibromuscular dysplasia or significant stenosis. IMA: Patent without evidence of aneurysm, dissection, vasculitis or significant stenosis. Inflow: Patent without evidence of aneurysm, dissection, vasculitis or significant stenosis. Proximal Outflow: Bilateral common femoral and visualized portions of the superficial and profunda femoral arteries are patent without evidence of aneurysm, dissection, vasculitis or significant stenosis. Veins: Patent hepatic veins, portal vein, SMV, splenic vein, bilateral renal veins, iliac venous system and IVC. No venous pathology identified. Review of the MIP images confirms the above findings. NON-VASCULAR Lower chest: No pleural or pericardial effusion. Minimal linear scarring in the lung bases. Hepatobiliary: No focal liver abnormality is seen. Status post cholecystectomy. No biliary dilatation. Pancreas: Unremarkable. No pancreatic ductal dilatation or surrounding inflammatory changes. Spleen:  Normal in size without focal abnormality. Adrenals/Urinary Tract: Adrenal glands unremarkable. Normal renal enhancement. No hydronephrosis. Urinary bladder physiologically distended. Stomach/Bowel: Stomach is nondistended. 6.1 cm posterior gastric diverticulum (previously 4.9)  containing fluid level and hyperdense material. No associated wall thickening nor inflammatory change. The small bowel is nondilated. Normal appendix. The colon is nondilated, unremarkable. No evidence of active extravasation. Lymphatic: No abdominal or pelvic adenopathy. Reproductive: Uterus and bilateral adnexa are unremarkable. Other: No ascites.  No free air. Musculoskeletal: Lower lumbar spondylitic changes. No fracture or worrisome bone lesion. IMPRESSION: 1. No evidence of active extravasation into the bowel. 2. Slight interval enlargement of posterior gastric diverticulum without adjacent inflammatory/edematous change. Electronically Signed   By: Lucrezia Europe M.D.   On: 02/04/2021 13:40    Orson Eva, DO  [facility]  If 7PM-7AM, please contact night-coverage [email or web address] Password [email or web address] 02/22/2021, 5:04 PM   LOS: 4 days

## 2021-02-23 DIAGNOSIS — K219 Gastro-esophageal reflux disease without esophagitis: Secondary | ICD-10-CM | POA: Diagnosis present

## 2021-02-23 DIAGNOSIS — R279 Unspecified lack of coordination: Secondary | ICD-10-CM | POA: Diagnosis not present

## 2021-02-23 DIAGNOSIS — M6281 Muscle weakness (generalized): Secondary | ICD-10-CM | POA: Diagnosis not present

## 2021-02-23 DIAGNOSIS — K31811 Angiodysplasia of stomach and duodenum with bleeding: Secondary | ICD-10-CM | POA: Diagnosis present

## 2021-02-23 DIAGNOSIS — Z20822 Contact with and (suspected) exposure to covid-19: Secondary | ICD-10-CM | POA: Diagnosis present

## 2021-02-23 DIAGNOSIS — K31819 Angiodysplasia of stomach and duodenum without bleeding: Secondary | ICD-10-CM | POA: Diagnosis not present

## 2021-02-23 DIAGNOSIS — I700 Atherosclerosis of aorta: Secondary | ICD-10-CM | POA: Diagnosis not present

## 2021-02-23 DIAGNOSIS — R41841 Cognitive communication deficit: Secondary | ICD-10-CM | POA: Diagnosis not present

## 2021-02-23 DIAGNOSIS — Z803 Family history of malignant neoplasm of breast: Secondary | ICD-10-CM | POA: Diagnosis not present

## 2021-02-23 DIAGNOSIS — R531 Weakness: Secondary | ICD-10-CM | POA: Diagnosis not present

## 2021-02-23 DIAGNOSIS — K921 Melena: Secondary | ICD-10-CM | POA: Diagnosis not present

## 2021-02-23 DIAGNOSIS — I10 Essential (primary) hypertension: Secondary | ICD-10-CM | POA: Diagnosis not present

## 2021-02-23 DIAGNOSIS — Z299 Encounter for prophylactic measures, unspecified: Secondary | ICD-10-CM | POA: Diagnosis not present

## 2021-02-23 DIAGNOSIS — D509 Iron deficiency anemia, unspecified: Secondary | ICD-10-CM | POA: Diagnosis not present

## 2021-02-23 DIAGNOSIS — K5900 Constipation, unspecified: Secondary | ICD-10-CM | POA: Diagnosis present

## 2021-02-23 DIAGNOSIS — D649 Anemia, unspecified: Secondary | ICD-10-CM | POA: Diagnosis not present

## 2021-02-23 DIAGNOSIS — Z515 Encounter for palliative care: Secondary | ICD-10-CM | POA: Diagnosis not present

## 2021-02-23 DIAGNOSIS — Z7951 Long term (current) use of inhaled steroids: Secondary | ICD-10-CM | POA: Diagnosis not present

## 2021-02-23 DIAGNOSIS — M545 Low back pain, unspecified: Secondary | ICD-10-CM | POA: Diagnosis not present

## 2021-02-23 DIAGNOSIS — G894 Chronic pain syndrome: Secondary | ICD-10-CM | POA: Diagnosis present

## 2021-02-23 DIAGNOSIS — E785 Hyperlipidemia, unspecified: Secondary | ICD-10-CM | POA: Diagnosis present

## 2021-02-23 DIAGNOSIS — M810 Age-related osteoporosis without current pathological fracture: Secondary | ICD-10-CM | POA: Diagnosis not present

## 2021-02-23 DIAGNOSIS — R5381 Other malaise: Secondary | ICD-10-CM | POA: Diagnosis not present

## 2021-02-23 DIAGNOSIS — Z7401 Bed confinement status: Secondary | ICD-10-CM | POA: Diagnosis not present

## 2021-02-23 DIAGNOSIS — Z9049 Acquired absence of other specified parts of digestive tract: Secondary | ICD-10-CM | POA: Diagnosis not present

## 2021-02-23 DIAGNOSIS — K922 Gastrointestinal hemorrhage, unspecified: Secondary | ICD-10-CM | POA: Diagnosis not present

## 2021-02-23 DIAGNOSIS — R29898 Other symptoms and signs involving the musculoskeletal system: Secondary | ICD-10-CM | POA: Diagnosis not present

## 2021-02-23 DIAGNOSIS — Z853 Personal history of malignant neoplasm of breast: Secondary | ICD-10-CM | POA: Diagnosis not present

## 2021-02-23 DIAGNOSIS — J449 Chronic obstructive pulmonary disease, unspecified: Secondary | ICD-10-CM | POA: Diagnosis not present

## 2021-02-23 DIAGNOSIS — R9431 Abnormal electrocardiogram [ECG] [EKG]: Secondary | ICD-10-CM | POA: Diagnosis not present

## 2021-02-23 DIAGNOSIS — Z87891 Personal history of nicotine dependence: Secondary | ICD-10-CM | POA: Diagnosis not present

## 2021-02-23 DIAGNOSIS — G610 Guillain-Barre syndrome: Secondary | ICD-10-CM | POA: Diagnosis not present

## 2021-02-23 DIAGNOSIS — Z7189 Other specified counseling: Secondary | ICD-10-CM | POA: Diagnosis not present

## 2021-02-23 DIAGNOSIS — K317 Polyp of stomach and duodenum: Secondary | ICD-10-CM | POA: Diagnosis present

## 2021-02-23 DIAGNOSIS — R2689 Other abnormalities of gait and mobility: Secondary | ICD-10-CM | POA: Diagnosis not present

## 2021-02-23 DIAGNOSIS — F419 Anxiety disorder, unspecified: Secondary | ICD-10-CM | POA: Diagnosis present

## 2021-02-23 DIAGNOSIS — D62 Acute posthemorrhagic anemia: Secondary | ICD-10-CM | POA: Diagnosis present

## 2021-02-23 DIAGNOSIS — Z79899 Other long term (current) drug therapy: Secondary | ICD-10-CM | POA: Diagnosis not present

## 2021-02-23 DIAGNOSIS — F32A Depression, unspecified: Secondary | ICD-10-CM | POA: Diagnosis not present

## 2021-02-23 LAB — COMPREHENSIVE METABOLIC PANEL
ALT: 38 U/L (ref 0–44)
AST: 39 U/L (ref 15–41)
Albumin: 3.3 g/dL — ABNORMAL LOW (ref 3.5–5.0)
Alkaline Phosphatase: 86 U/L (ref 38–126)
Anion gap: 7 (ref 5–15)
BUN: 16 mg/dL (ref 8–23)
CO2: 29 mmol/L (ref 22–32)
Calcium: 9.3 mg/dL (ref 8.9–10.3)
Chloride: 98 mmol/L (ref 98–111)
Creatinine, Ser: 0.51 mg/dL (ref 0.44–1.00)
GFR, Estimated: >60 mL/min (ref >60)
Glucose, Bld: 95 mg/dL (ref 70–99)
Potassium: 4.6 mmol/L (ref 3.5–5.1)
Sodium: 134 mmol/L — ABNORMAL LOW (ref 135–145)
Total Bilirubin: 0.4 mg/dL (ref 0.3–1.2)
Total Protein: 9.2 g/dL — ABNORMAL HIGH (ref 6.5–8.1)

## 2021-02-23 LAB — CBC WITH DIFFERENTIAL/PLATELET
Abs Immature Granulocytes: 0.03 10*3/uL (ref 0.00–0.07)
Basophils Absolute: 0.1 10*3/uL (ref 0.0–0.1)
Basophils Relative: 1 %
Eosinophils Absolute: 0.1 10*3/uL (ref 0.0–0.5)
Eosinophils Relative: 3 %
HCT: 35.7 % — ABNORMAL LOW (ref 36.0–46.0)
Hemoglobin: 10.6 g/dL — ABNORMAL LOW (ref 12.0–15.0)
Immature Granulocytes: 1 %
Lymphocytes Relative: 30 %
Lymphs Abs: 1.4 10*3/uL (ref 0.7–4.0)
MCH: 27 pg (ref 26.0–34.0)
MCHC: 29.7 g/dL — ABNORMAL LOW (ref 30.0–36.0)
MCV: 91.1 fL (ref 80.0–100.0)
Monocytes Absolute: 0.8 10*3/uL (ref 0.1–1.0)
Monocytes Relative: 17 %
Neutro Abs: 2.3 10*3/uL (ref 1.7–7.7)
Neutrophils Relative %: 48 %
Platelets: 242 10*3/uL (ref 150–400)
RBC: 3.92 MIL/uL (ref 3.87–5.11)
RDW: 15.1 % (ref 11.5–15.5)
WBC: 4.6 10*3/uL (ref 4.0–10.5)
nRBC: 0 % (ref 0.0–0.2)

## 2021-02-23 LAB — MAGNESIUM: Magnesium: 1.9 mg/dL (ref 1.7–2.4)

## 2021-02-23 LAB — RESP PANEL BY RT-PCR (FLU A&B, COVID) ARPGX2
Influenza A by PCR: NEGATIVE
Influenza B by PCR: NEGATIVE
SARS Coronavirus 2 by RT PCR: NEGATIVE

## 2021-02-23 LAB — CK: Total CK: 39 U/L (ref 38–234)

## 2021-02-23 MED ORDER — OXYCODONE HCL 10 MG PO TABS: 10.0000 mg | ORAL_TABLET | Freq: Four times a day (QID) | ORAL | 0 refills | Status: DC | PRN

## 2021-02-23 MED ORDER — CYANOCOBALAMIN 500 MCG PO TABS: 500.0000 ug | ORAL_TABLET | Freq: Every day | ORAL | Status: DC

## 2021-02-23 MED ORDER — GABAPENTIN 300 MG PO CAPS: 300.0000 mg | ORAL_CAPSULE | Freq: Three times a day (TID) | ORAL | Status: DC

## 2021-02-23 NOTE — Progress Notes (Signed)
Occupational Therapy Treatment Patient Details Name: Alexa Nichols MRN: [id number] DOB: 09/29/1946 Today's Date: 02/23/2021    History of present illness Alexa Nichols is a 75 y.o. female presents with complaints of BLE weakness. PMH: seasonal allergies, lobular carcinoma of L breast, GERD, COPD   OT comments  Pt agreeable to OT treatment this date. Pt demonstrated ability to complete shoulder A/ROM by returning demonstration of A/ROM HEP for ~2 to 3 reps. Pt demonstrates labored movement with flexion and abduction of the shoulder with mild deficits in A/ROM intermittently when completed in sitting. Pt required mod to max A for sit to stand with and without RW. Pt required mod to max A with no RW and support anteriorly from this therapist. Pt seems to have increased LE weakness compared to evaluation. Recommendation upgraded to SNF. Pt will benefit from continued acute OT services to address deficit areas prior to discharge.   Follow Up Recommendations  SNF    Equipment Recommendations  None recommended by OT          Precautions / Restrictions Precautions Precautions: Fall Restrictions Weight Bearing Restrictions: No       Mobility Bed Mobility Overal bed mobility: Needs Assistance Bed Mobility: Supine to Sit     Supine to sit: Mod assist     General bed mobility comments: slow labored movement    Transfers Overall transfer level: Needs assistance Equipment used: Rolling walker (2 wheeled) Transfers: Stand Pivot Transfers Sit to Stand: Max assist;Mod assist Stand pivot transfers: Mod assist;Max assist       General transfer comment: attempted sit to stand twice with RW. Graded to this therapist prividing torso and UE support due to fear of knees buckling. Stand pivot completed without RW and mod to max A from EOB to chair.    Balance Overall balance assessment: Needs assistance Sitting-balance support: Feet supported Sitting balance-Leahy Scale: Fair Sitting  balance - Comments: seated EOB fair to good   Standing balance support: During functional activity;Bilateral upper extremity supported Standing balance-Leahy Scale: Zero Standing balance comment: Reliant on UE support via RW and via this therapist.                                                Cognition Arousal/Alertness: Awake/alert Behavior During Therapy: WFL for tasks assessed/performed Overall Cognitive Status: Within Functional Limits for tasks assessed                                          Exercises Exercises: Other exercises Other Exercises Other Exercises: ~x5 sit to stand from EOB. Other Exercises: ~2 to 3 reps of shoulder flexion, retraction, interal and external rotation, and abduction as part of education on A/ROM HEP.                Pertinent Vitals/ Pain       Pain Assessment: 0-10 Pain Score: 6  Pain Location: Medial to R shoulder blade Pain Descriptors / Indicators: Constant (chronic) Pain Intervention(s): Limited activity within patient's tolerance;Monitored during session;Other (comment) (~3 to 5 minutes of manual techniques to pain area.)  Frequency  Min 2X/week        Progress Toward Goals  OT Goals(current goals can now be found in the care plan section)  Progress towards OT goals: Progressing toward goals  Acute Rehab OT Goals Patient Stated Goal: to return home OT Goal Formulation: With patient Time For Goal Achievement: 03/03/21 Potential to Achieve Goals: Fair ADL Goals Pt Will Perform Grooming: with min guard assist;standing;with supervision Pt Will Perform Upper Body Dressing: with modified independence;sitting;Independently Pt Will Perform Lower Body Dressing: with min guard assist;with supervision;with adaptive equipment;sitting/lateral leans;sit to/from stand Pt Will Transfer to Toilet: with supervision;with  min guard assist;stand pivot transfer;ambulating Pt/caregiver will Perform Home Exercise Program: Increased strength;Both right and left upper extremity;With Supervision;Independently  Plan Discharge plan needs to be updated                                    End of Session Equipment Utilized During Treatment: Rolling walker  OT Visit Diagnosis: Unsteadiness on feet (R26.81);Muscle weakness (generalized) (M62.81);History of falling (Z91.81)   Activity Tolerance Patient tolerated treatment well   Patient Left in chair;with call bell/phone within reach;with family/visitor present   Nurse Communication Mobility status        Time: 7425-9563 OT Time Calculation (min): 28 min  Charges: OT General Charges $OT Visit: 1 Visit OT Treatments $Therapeutic Exercise: 23-37 mins  Kalman Nylen OT, MOT    Larey Seat 02/23/2021, 10:00 AM

## 2021-02-23 NOTE — Care Management Important Message (Signed)
Important Message  Patient Details  Name: Alexa Nichols MRN: [id number] Date of Birth: 1946/02/24   Medicare Important Message Given:  Yes     Tommy Medal 02/23/2021, 11:17 AM

## 2021-02-23 NOTE — Progress Notes (Signed)
Physical Therapy Treatment Patient Details Name: Alexa Nichols MRN: [id number] DOB: 10-30-1945 Today's Date: 02/23/2021    History of Present Illness Alexa Nichols is a 75 y.o. female presents with complaints of BLE weakness. PMH: seasonal allergies, lobular carcinoma of L breast, GERD, COPD    PT Comments    Patient presents seated in chair (assisted by OT) and agreeable for therapy.  Patient demonstrates fair/good return for completing BLE ROM/strengthening exercises except required hand held assist to lift hips against gravity due to weakness.  Patient unable to stand using RW due to knees buckling and required Max assist stand pivot with knees blocked to put back in bed.  Patient will benefit from continued physical therapy in hospital and recommended venue below to increase strength, balance, endurance for safe ADLs and gait.    Follow Up Recommendations  SNF     Equipment Recommendations  None recommended by PT    Recommendations for Other Services       Precautions / Restrictions Precautions Precautions: Fall Restrictions Weight Bearing Restrictions: No    Mobility  Bed Mobility Overal bed mobility: Needs Assistance Bed Mobility: Sit to Supine       Sit to supine: Mod assist   General bed mobility comments: increased time, labored movement    Transfers Overall transfer level: Needs assistance Equipment used: 1 person hand held assist Transfers: Stand Pivot Transfers Sit to Stand: Max assist Stand pivot transfers: Max assist       General transfer comment: unable to stand using RW due to BLE weakness, required stand pivot with knees blocked  Ambulation/Gait                 Stairs             Wheelchair Mobility    Modified Rankin (Stroke Patients Only)       Balance Overall balance assessment: Needs assistance Sitting-balance support: Feet supported;No upper extremity supported Sitting balance-Leahy Scale: Fair Sitting  balance - Comments: seated at EOB   Standing balance support: During functional activity;No upper extremity supported Standing balance-Leahy Scale: Zero Standing balance comment: unable to maintain standing balance attempting to use RW                            Cognition Arousal/Alertness: Awake/alert Behavior During Therapy: WFL for tasks assessed/performed Overall Cognitive Status: Within Functional Limits for tasks assessed                                        Exercises General Exercises - Lower Extremity Ankle Circles/Pumps: Seated;AROM;Strengthening;10 reps;Both Long Arc Quad: AROM;Both;10 reps;Seated;Strengthening Hip Flexion/Marching: Seated;AAROM;Strengthening;Both;10 reps    General Comments        Pertinent Vitals/Pain Pain Assessment: No/denies pain    Home Living                      Prior Function            PT Goals (current goals can now be found in the care plan section) Acute Rehab PT Goals Patient Stated Goal: to return home PT Goal Formulation: With patient Time For Goal Achievement: 03/03/21 Potential to Achieve Goals: Good Progress towards PT goals: Progressing toward goals    Frequency    Min 3X/week      PT Plan Current plan remains appropriate  Co-evaluation              AM-PAC PT "6 Clicks" Mobility   Outcome Measure  Help needed turning from your back to your side while in a flat bed without using bedrails?: A Little Help needed moving from lying on your back to sitting on the side of a flat bed without using bedrails?: A Lot Help needed moving to and from a bed to a chair (including a wheelchair)?: A Lot Help needed standing up from a chair using your arms (e.g., wheelchair or bedside chair)?: A Lot Help needed to walk in hospital room?: A Lot Help needed climbing 3-5 steps with a railing? : Total 6 Click Score: 12    End of Session   Activity Tolerance: Patient tolerated  treatment well;Patient limited by fatigue Patient left: in bed;with call bell/phone within reach Nurse Communication: Mobility status PT Visit Diagnosis: Unsteadiness on feet (R26.81);Other abnormalities of gait and mobility (R26.89);Muscle weakness (generalized) (M62.81)     Time: 1914-7829 PT Time Calculation (min) (ACUTE ONLY): 20 min  Charges:  $Therapeutic Exercise: 8-22 mins $Therapeutic Activity: 8-22 mins                     3:13 PM, 02/23/21 Lonell Grandchild, MPT Physical Therapist with [facility] [phone] office [phone] mobile phone

## 2021-02-23 NOTE — Progress Notes (Signed)
Patient discharged at 1400 via EMS. Patients belongings sent with husband other than glasses and cell phone which was sent with patient. Discharge packet given to EMS. Report called to [place] at [facility]. Patient stable prior to discharge.

## 2021-02-23 NOTE — Patient Instructions (Signed)
1) ROM: Abduction (Standing)   Bring arms straight out from sides and raise as high as possible without pain. Repeat ____ times per set. Do ____ sets per session. Do ____ sessions per day.  http://orth.exer.us/910   Copyright  VHI. All rights reserved.   2) Extension (Active) ROM: Extension (Standing)   Bring arms straight back as far as possible without pain. Repeat ____ times per set. Do ____ sets per session. Do ____ sessions per day.  http://orth.exer.us/916   Copyright  VHI. All rights reserved.   3) ROM: External / Internal Rotation - in Abduction (Standing)   With upper arms parallel to floor and elbows bent at right angles, gently rotate arms up then down as far as possible without pain. Repeat ____ times per set. Do ____ sets per session. Do ____ sessions per day.  http://orth.exer.us/912   Copyright  VHI. All rights reserved.    4) Flexors Stretch (Active)   Stand, arms straight at sides. Bring arms straight forward and upward as high as possible without pain. Hold ___ seconds. Repeat ___ times per session. Do ___ sessions per day.  Copyright  VHI. All rights reserved.   5) Scapular Retraction (Standing)   With arms at sides, pinch shoulder blades together. Repeat ____ times per set. Do ____ sets per session. Do ____ sessions per day.  http://orth.exer.us/944   Copyright  VHI. All rights reserved.   

## 2021-02-23 NOTE — Discharge Summary (Signed)
Physician Discharge Summary  Alexa NoeMartha Nichols ZOX:[id number] DOB: 04-20-1946 DOA: 02/16/2021  PCP: Ignatius SpeckingVyas, [name], MD  Admit date: 02/16/2021 Discharge date: 02/23/2021  Admitted From: Home Disposition:  SNF  Recommendations for Outpatient Follow-up:  1. Follow up with PCP in 1-2 weeks 2. Please obtain BMP/CBC in one week     Discharge Condition: Stable CODE STATUS: FULL Diet recommendation: Heart Healthy    Brief/Interim Summary: 75 y.o.female,with history of seasonal duties, lobular carcinoma of left breast, GERD, COPD, and more presents to ED with a chief complaint of leg weakness. Patient reports that she was hospitalized 2 weeks ago for GI bleed and was transfused a couple units. Since she left the hospital she has had pain in her lower back that she attributed to the awkward bed.She stated her back pain has been chronic for which she takes hydrocodone. She stated that her back pain was really not much worse than usual. However, she began noting progressive bilateral leg weakness starting 02/12/21. She saw her PCP on 02/13/21 who ordered PT, but her leg weakness continued to progress to the point where she was not able to bear weight. She denied any f/c, cp, sob, n/v/d or radicular symptoms. Because of progressive leg weakness, she presented for further evaluation. Neurology was consulted and started IVIG.  MRI of T-spine, L-spine and brain were essentially unremarkable.  The patient show incremental improvement in her lower extremity strength.  PT eval was consulted and recommended SNF with which the patient agreed  In the ED Temp 98, heart rate 78-91, respiratory rate 16-20, blood pressure 154/62, satting at 98% Troponin normal at 4 and 5 UA is not indicative of UTI CT head shows no acute findings EKG shows a heart rate of 87, sinus rhythm, QTC 493 Mild leukocytosis of 11.8, hemoglobin 11.9 Telemetry neuro was initially supposed to consult, but they are too backed up so  the plan is for inpatient consult in the a.m.  Discharge Diagnoses:  Acute Inflammatory Demyelinating Polyneuropathy (AIDP) -IVIG started 5/17 evening -finished5 days IVIG 02/21/21 -02/18/21 CSF consistent with albumincytologic dissociation -PT eval>>SNF -stable on RA without and sob -incentive spirometry -leg weakness overall showing some improvement -CK 32  Chronic back pain -continueoxycodone--increase to 10 mg q 6 hours prn -increase gabapentin to 300 mg q8 -MRI T +L spine without cord impingement -MRI brain neg for acute finding except incidental meningioma  COPD -stable on RA -continue Breo, singulair, incruse  GI bleed hx -due to gastric and duodenal AVMs -s/p endoscopy 02/05/21 -Hgb stable  Depression -continue sertaline  Hyperlipidemia -continue statin  Discharge Instructions   Allergies as of 02/23/2021      Reactions   Aspirin    samter's syndrome   Codeine Itching   Sulfa Antibiotics    Unknown reaction   Other Rash   gel filled fentanyl patch, adhesive on that patch caused ithcing      Medication List    STOP taking these medications   fentaNYL 25 MCG/HR Commonly known as: DURAGESIC   HYDROcodone-acetaminophen 5-325 MG tablet Commonly known as: NORCO/VICODIN     TAKE these medications   acetaminophen 500 MG tablet Commonly known as: TYLENOL Take 500 mg by mouth every 6 (six) hours as needed for moderate pain or headache.   ALPRAZolam 0.5 MG tablet Commonly known as: XANAX Take 0.25 mg by mouth daily as needed for anxiety.   ferrous sulfate 325 (65 FE) MG tablet Take 1 tablet (325 mg total) by mouth daily with breakfast.  Flintstones Complete 18 MG Chew Chew 1 tablet by mouth 2 (two) times daily.   gabapentin 300 MG capsule Commonly known as: NEURONTIN Take 1 capsule (300 mg total) by mouth 3 (three) times daily.   montelukast 10 MG tablet Commonly known as: SINGULAIR Take 10 mg by mouth at bedtime.   Oxycodone HCl 10 MG  Tabs Take 1 tablet (10 mg total) by mouth every 6 (six) hours as needed for moderate pain.   pantoprazole 40 MG tablet Commonly known as: Protonix Take 1 tablet (40 mg total) by mouth 2 (two) times daily.   PreserVision AREDS 2 Caps Take 1 capsule by mouth in the morning and at bedtime.   sertraline 50 MG tablet Commonly known as: ZOLOFT Take 25 mg by mouth daily.   simvastatin 20 MG tablet Commonly known as: ZOCOR Take 20 mg by mouth daily.   tiZANidine 4 MG tablet Commonly known as: ZANAFLEX Take 2 mg by mouth at bedtime as needed for muscle spasms.   Trelegy Ellipta 200-62.5-25 MCG/INH Aepb Generic drug: Fluticasone-Umeclidin-Vilant Inhale 1 puff into the lungs daily.   vitamin B-12 500 MCG tablet Commonly known as: CYANOCOBALAMIN Take 1 tablet (500 mcg total) by mouth daily. Start taking on: Feb 24, 2021       Contact information for follow-up providers    [facility] [place] [facility].   Why:  PT will call to set up first home visit within 48 hours.  Contact information: [place] [address] [phone]            Contact information for after-discharge care    Destination    [facility] .   Service: Skilled Nursing Contact information: [address] [place] [phone]                 Allergies  Allergen Reactions  . Aspirin     samter's syndrome  . Codeine Itching  . Sulfa Antibiotics     Unknown reaction  . Other Rash    gel filled fentanyl patch, adhesive on that patch caused ithcing    Consultations:  neurology   Procedures/Studies: CT Head Wo Contrast  Result Date: 02/16/2021 CLINICAL DATA:  Rule out stroke.  Bilateral leg weakness for 2 weeks EXAM: CT HEAD WITHOUT CONTRAST TECHNIQUE: Contiguous axial images were obtained from the base of the skull through the vertex without intravenous contrast. COMPARISON:  None.  FINDINGS: Brain: No evidence of acute infarction, hemorrhage, hydrocephalus, extra-axial collection or mass lesion/mass effect. Vascular: No hyperdense vessel or unexpected calcification. Skull: Normal. Negative for fracture or focal lesion. Sinuses/Orbits: Status post bilateral maxillary sinus median antrectomies. Moderate diffuse mucosal thickening noted involving the maxillary sinuses, sphenoid sinuses and ethmoid air cells. Mastoid air cells appear clear. Other: None. IMPRESSION: 1. No acute intracranial abnormalities. 2. Chronic sinus inflammation. Electronically Signed   By: Kerby Moors M.D.   On: 02/16/2021 16:44   MR BRAIN WO CONTRAST  Result Date: 02/17/2021 CLINICAL DATA:  Neuro deficit, acute stroke suspected. Difficulty walking. EXAM: MRI HEAD WITHOUT CONTRAST TECHNIQUE: Multiplanar, multiecho pulse sequences of the brain and surrounding structures were obtained without intravenous contrast. COMPARISON:  CT head Feb 16 2021. FINDINGS: Brain: No acute infarction, hemorrhage, hydrocephalus, or extra-axial fluid collection. Mild for age T2/FLAIR hyperintensities within the white matter, most likely related to chronic microvascular ischemic disease. Incidental 11 mm calcified extra-axial, dural-based lesion along the posterior right vertex (series  11, image 23), likely a meningioma. No mass effect or surrounding edema. Vascular: Major arterial flow voids are maintained at the skull base. Skull and upper cervical spine: Normal marrow signal. Sinuses/Orbits: Evidence of prior endoscopic sinus surgery with bilateral maxillary antrostomies and ethmoidectomies. Moderate pansinus mucosal thickening, greatest in the ethmoid air cells and sphenoid sinuses. Air-fluid level in the right sphenoid sinus. Other: Small right mastoid effusion. IMPRESSION: 1. No evidence of acute intracranial abnormality. Specifically, no acute infarct. 2. Incidental presumed 11 mm meningioma along the vertex without mass effect or  surrounding edema. 3. Evidence of prior endoscopic sinus surgery with moderate paranasal sinus mucosal thickening, as detailed above. Electronically Signed   By: Margaretha Sheffield MD   On: 02/17/2021 09:58   MR THORACIC SPINE WO CONTRAST  Result Date: 02/17/2021 CLINICAL DATA:  Lumbar radiculopathy. EXAM: MRI THORACIC SPINE WITHOUT CONTRAST TECHNIQUE: Multiplanar, multisequence MR imaging of the thoracic spine was performed. No intravenous contrast was administered. COMPARISON:  None. FINDINGS: Alignment:  Exaggerated thoracic kyphosis. Vertebrae: No fracture, evidence of discitis, or bone lesion. Anterior vertebral body/disc space ankylosis from T3 through T5 and at T6-7. Cord: Normal cord signal. Small indentation on the anterior cord surface at the C6-7 and T8-9 Paraspinal and other soft tissues: Negative. Disc levels: Small posterior disc protrude at C6-7, C7-T1 and T1-2 causing small indentation on the thecal sac without significant spinal canal or neural foraminal stenosis. Small posterior disc protrusions at T6-7 and T8-9 causing small indentations on the anterior cord surface without cord signal abnormality, significant spinal canal stenosis or neural foraminal stenosis. No significant spinal canal or neural foraminal stenosis at the remaining thoracic levels. Right perineural cysts at T6-7 and T10-11 IMPRESSION: Exaggerated thoracic kyphosis and mild degenerative changes without high-grade spinal canal or neural foraminal stenosis at any level. Electronically Signed   By: Pedro Earls M.D.   On: 02/17/2021 17:09   MR LUMBAR SPINE WO CONTRAST  Result Date: 02/17/2021 CLINICAL DATA:  Lumbar radiculopathy, greater than 6 weeks. EXAM: MRI LUMBAR SPINE WITHOUT CONTRAST TECHNIQUE: Multiplanar, multisequence MR imaging of the lumbar spine was performed. No intravenous contrast was administered. COMPARISON:  CT abdomen/pelvis 02/04/2021. FINDINGS: Segmentation: 5 lumbar vertebrae. The  caudal most well-formed intervertebral disc space is designated L5-S1 Alignment: Trace L1-L2 grade 1 retrolisthesis. Trace L3-L4 grade 1 retrolisthesis. Trace L5-S1 grade 1 anterolisthesis. Vertebrae: Vertebral body height is maintained. No significant marrow edema or focal suspicious osseous lesion. L2 vertebral body hemangioma. Conus medullaris and cauda equina: Conus extends to the L2 level. No signal abnormality within the visualized distal spinal cord. Paraspinal and other soft tissues: Tiny T2 hyperintense lesion within the right kidney, too small to characterize but likely reflecting a cyst. Paraspinal soft tissues within normal limits. Disc levels: Mild disc degeneration at the L2-L3 through L5-S1 levels, greatest at L3-L4 and L5-S1. T12-L1: Imaged sagittally. Shallow disc bulge. No significant spinal canal or foraminal stenosis. L1-L2: No significant disc herniation or stenosis. L2-L3: Minimal disc bulge. No significant spinal canal or foraminal stenosis. L3-L4: Disc bulge. Superimposed shallow broad-based right subarticular to right foraminal disc protrusion. Mild relative right subarticular narrowing without frank nerve root impingement. Central canal patent. Minimal relative right neural foraminal narrowing without nerve root impingement. L4-L5: Minimal disc bulge. Minimal facet arthrosis. Mild ligamentum flavum hypertrophy on the right. No significant spinal canal or foraminal stenosis. L5-S1: Disc bulge. Mild endplate spurring. Facet arthrosis/ligamentum flavum hypertrophy. Bilateral subarticular narrowing (mild right, mild/moderate left) with some crowding of the left greater  than right descending S1 nerve roots (series 19, image 25). Central canal patent. Mild relative bilateral neural foraminal narrowing. IMPRESSION: Lumbar spondylosis, as outlined and with findings most notably as follows. At L5-S1, there is trace grade 1 anterolisthesis. Mild disc degeneration. Disc bulge. Mild endplate spurring.  Facet arthrosis/ligamentum flavum hypertrophy. Bilateral subarticular narrowing (mild right, mild/moderate left) with some crowding of the left greater than right descending S1 nerve roots. Mild relative bilateral neural foraminal narrowing. At L3-L4, there is trace retrolisthesis. Mild disc degeneration. Superimposed shallow broad-based right subarticular to right foraminal disc protrusion. Mild right subarticular narrowing without frank nerve root impingement. Minimal relative right neural foraminal narrowing without nerve root impingement. No significant spinal canal or neural foraminal stenosis at the remaining levels. Electronically Signed   By: Kellie Simmering DO   On: 02/17/2021 17:01   DG FL GUIDED LUMBAR PUNCTURE  Result Date: 02/18/2021 CLINICAL DATA:  Neurosarcoidosis. EXAM: DIAGNOSTIC LUMBAR PUNCTURE UNDER FLUOROSCOPIC GUIDANCE COMPARISON:  None. FLUOROSCOPY TIME:  Radiation Exposure Index (if provided by the fluoroscopic device): 22.7 mGy. Number of Acquired Spot Images: 2. PROCEDURE: Informed consent was obtained from the patient prior to the procedure, including potential complications of headache, allergy, and pain. With the patient prone, the lower back was prepped with Betadine. 1% Lidocaine was used for local anesthesia. Under fluoroscopic guidance, lumbar puncture was performed at the L3-4 level using a 20 gauge needle with return of clear CSF. Ten ml of CSF were obtained for laboratory studies. Needle was removed and appropriate dressing was applied. The patient tolerated the procedure well and there were no apparent complications. IMPRESSION: Under fluoroscopic guidance, successful lumbar puncture was performed for diagnostic purposes. Electronically Signed   By: Marijo Conception M.D.   On: 02/18/2021 11:41   CT Angio Abd/Pel w/ and/or w/o  Result Date: 02/04/2021 CLINICAL DATA:  Obscure GI bleed, transfusion dependent anemia. EXAM: CTA ABDOMEN AND PELVIS WITHOUT AND WITH CONTRAST TECHNIQUE:  Multidetector CT imaging of the abdomen and pelvis was performed using the standard protocol during bolus administration of intravenous contrast. Multiplanar reconstructed images and MIPs were obtained and reviewed to evaluate the vascular anatomy. CONTRAST:  167mL OMNIPAQUE IOHEXOL 350 MG/ML SOLN COMPARISON:  12/07/2015 FINDINGS: VASCULAR Aorta: Mild calcified atheromatous plaque. No aneurysm, dissection, or stenosis. Celiac: Patent.  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 SMA: Patent without evidence of aneurysm, dissection, vasculitis or significant stenosis. Renals: Both renal arteries are patent without evidence of aneurysm, dissection, vasculitis, fibromuscular dysplasia or significant stenosis. IMA: Patent without evidence of aneurysm, dissection, vasculitis or significant stenosis. Inflow: Patent without evidence of aneurysm, dissection, vasculitis or significant stenosis. Proximal Outflow: Bilateral common femoral and visualized portions of the superficial and profunda femoral arteries are patent without evidence of aneurysm, dissection, vasculitis or significant stenosis. Veins: Patent hepatic veins, portal vein, SMV, splenic vein, bilateral renal veins, iliac venous system and IVC. No venous pathology identified. Review of the MIP images confirms the above findings. NON-VASCULAR Lower chest: No pleural or pericardial effusion. Minimal linear scarring in the lung bases. Hepatobiliary: No focal liver abnormality is seen. Status post cholecystectomy. No biliary dilatation. Pancreas: Unremarkable. No pancreatic ductal dilatation or surrounding inflammatory changes. Spleen: Normal in size without focal abnormality. Adrenals/Urinary Tract: Adrenal glands unremarkable. Normal renal enhancement. No hydronephrosis. Urinary bladder physiologically distended. Stomach/Bowel: Stomach is nondistended. 6.1 cm posterior gastric diverticulum (previously 4.9) containing fluid level and hyperdense material. No  associated wall thickening nor inflammatory change. The small bowel is nondilated. Normal appendix. The colon  is nondilated, unremarkable. No evidence of active extravasation. Lymphatic: No abdominal or pelvic adenopathy. Reproductive: Uterus and bilateral adnexa are unremarkable. Other: No ascites.  No free air. Musculoskeletal: Lower lumbar spondylitic changes. No fracture or worrisome bone lesion. IMPRESSION: 1. No evidence of active extravasation into the bowel. 2. Slight interval enlargement of posterior gastric diverticulum without adjacent inflammatory/edematous change. Electronically Signed   By: Lucrezia Europe M.D.   On: 02/04/2021 13:40        Discharge Exam: Vitals:   02/23/21 0520 02/23/21 0729  BP: (!) 154/93   Pulse: 79   Resp: 18   Temp: 98 F (36.7 C)   SpO2: 98% 97%   Vitals:   02/22/21 1410 02/22/21 2044 02/23/21 0520 02/23/21 0729  BP: 132/80 (!) 162/83 (!) 154/93   Pulse: 91 85 79   Resp: 16 18 18    Temp: 98.4 F (36.9 C) 99 F (37.2 C) 98 F (36.7 C)   TempSrc: Oral Oral Oral   SpO2: 96% 96% 98% 97%  Weight:      Height:        General: Pt is alert, awake, not in acute distress Cardiovascular: RRR, S1/S2 +, no rubs, no gallops Respiratory: CTA bilaterally, no wheezing, no rhonchi Abdominal: Soft, NT, ND, bowel sounds + Extremities: no edema, no cyanosis Neuro:  CN II-XII intact, strength 3/5 in  RLE, strength 3/5 LLE; sensation intact bilateral; no dysmetria; babinski equivocal    The results of significant diagnostics from this hospitalization (including imaging, microbiology, ancillary and laboratory) are listed below for reference.    Significant Diagnostic Studies: CT Head Wo Contrast  Result Date: 02/16/2021 CLINICAL DATA:  Rule out stroke.  Bilateral leg weakness for 2 weeks EXAM: CT HEAD WITHOUT CONTRAST TECHNIQUE: Contiguous axial images were obtained from the base of the skull through the vertex without intravenous contrast. COMPARISON:  None.  FINDINGS: Brain: No evidence of acute infarction, hemorrhage, hydrocephalus, extra-axial collection or mass lesion/mass effect. Vascular: No hyperdense vessel or unexpected calcification. Skull: Normal. Negative for fracture or focal lesion. Sinuses/Orbits: Status post bilateral maxillary sinus median antrectomies. Moderate diffuse mucosal thickening noted involving the maxillary sinuses, sphenoid sinuses and ethmoid air cells. Mastoid air cells appear clear. Other: None. IMPRESSION: 1. No acute intracranial abnormalities. 2. Chronic sinus inflammation. Electronically Signed   By: Kerby Moors M.D.   On: 02/16/2021 16:44   MR BRAIN WO CONTRAST  Result Date: 02/17/2021 CLINICAL DATA:  Neuro deficit, acute stroke suspected. Difficulty walking. EXAM: MRI HEAD WITHOUT CONTRAST TECHNIQUE: Multiplanar, multiecho pulse sequences of the brain and surrounding structures were obtained without intravenous contrast. COMPARISON:  CT head Feb 16 2021. FINDINGS: Brain: No acute infarction, hemorrhage, hydrocephalus, or extra-axial fluid collection. Mild for age T2/FLAIR hyperintensities within the white matter, most likely related to chronic microvascular ischemic disease. Incidental 11 mm calcified extra-axial, dural-based lesion along the posterior right vertex (series 11, image 23), likely a meningioma. No mass effect or surrounding edema. Vascular: Major arterial flow voids are maintained at the skull base. Skull and upper cervical spine: Normal marrow signal. Sinuses/Orbits: Evidence of prior endoscopic sinus surgery with bilateral maxillary antrostomies and ethmoidectomies. Moderate pansinus mucosal thickening, greatest in the ethmoid air cells and sphenoid sinuses. Air-fluid level in the right sphenoid sinus. Other: Small right mastoid effusion. IMPRESSION: 1. No evidence of acute intracranial abnormality. Specifically, no acute infarct. 2. Incidental presumed 11 mm meningioma along the vertex without mass effect or  surrounding edema. 3. Evidence of prior endoscopic sinus surgery with  moderate paranasal sinus mucosal thickening, as detailed above. Electronically Signed   By: Margaretha Sheffield MD   On: 02/17/2021 09:58   MR THORACIC SPINE WO CONTRAST  Result Date: 02/17/2021 CLINICAL DATA:  Lumbar radiculopathy. EXAM: MRI THORACIC SPINE WITHOUT CONTRAST TECHNIQUE: Multiplanar, multisequence MR imaging of the thoracic spine was performed. No intravenous contrast was administered. COMPARISON:  None. FINDINGS: Alignment:  Exaggerated thoracic kyphosis. Vertebrae: No fracture, evidence of discitis, or bone lesion. Anterior vertebral body/disc space ankylosis from T3 through T5 and at T6-7. Cord: Normal cord signal. Small indentation on the anterior cord surface at the C6-7 and T8-9 Paraspinal and other soft tissues: Negative. Disc levels: Small posterior disc protrude at C6-7, C7-T1 and T1-2 causing small indentation on the thecal sac without significant spinal canal or neural foraminal stenosis. Small posterior disc protrusions at T6-7 and T8-9 causing small indentations on the anterior cord surface without cord signal abnormality, significant spinal canal stenosis or neural foraminal stenosis. No significant spinal canal or neural foraminal stenosis at the remaining thoracic levels. Right perineural cysts at T6-7 and T10-11 IMPRESSION: Exaggerated thoracic kyphosis and mild degenerative changes without high-grade spinal canal or neural foraminal stenosis at any level. Electronically Signed   By: Pedro Earls M.D.   On: 02/17/2021 17:09   MR LUMBAR SPINE WO CONTRAST  Result Date: 02/17/2021 CLINICAL DATA:  Lumbar radiculopathy, greater than 6 weeks. EXAM: MRI LUMBAR SPINE WITHOUT CONTRAST TECHNIQUE: Multiplanar, multisequence MR imaging of the lumbar spine was performed. No intravenous contrast was administered. COMPARISON:  CT abdomen/pelvis 02/04/2021. FINDINGS: Segmentation: 5 lumbar vertebrae. The  caudal most well-formed intervertebral disc space is designated L5-S1 Alignment: Trace L1-L2 grade 1 retrolisthesis. Trace L3-L4 grade 1 retrolisthesis. Trace L5-S1 grade 1 anterolisthesis. Vertebrae: Vertebral body height is maintained. No significant marrow edema or focal suspicious osseous lesion. L2 vertebral body hemangioma. Conus medullaris and cauda equina: Conus extends to the L2 level. No signal abnormality within the visualized distal spinal cord. Paraspinal and other soft tissues: Tiny T2 hyperintense lesion within the right kidney, too small to characterize but likely reflecting a cyst. Paraspinal soft tissues within normal limits. Disc levels: Mild disc degeneration at the L2-L3 through L5-S1 levels, greatest at L3-L4 and L5-S1. T12-L1: Imaged sagittally. Shallow disc bulge. No significant spinal canal or foraminal stenosis. L1-L2: No significant disc herniation or stenosis. L2-L3: Minimal disc bulge. No significant spinal canal or foraminal stenosis. L3-L4: Disc bulge. Superimposed shallow broad-based right subarticular to right foraminal disc protrusion. Mild relative right subarticular narrowing without frank nerve root impingement. Central canal patent. Minimal relative right neural foraminal narrowing without nerve root impingement. L4-L5: Minimal disc bulge. Minimal facet arthrosis. Mild ligamentum flavum hypertrophy on the right. No significant spinal canal or foraminal stenosis. L5-S1: Disc bulge. Mild endplate spurring. Facet arthrosis/ligamentum flavum hypertrophy. Bilateral subarticular narrowing (mild right, mild/moderate left) with some crowding of the left greater than right descending S1 nerve roots (series 19, image 25). Central canal patent. Mild relative bilateral neural foraminal narrowing. IMPRESSION: Lumbar spondylosis, as outlined and with findings most notably as follows. At L5-S1, there is trace grade 1 anterolisthesis. Mild disc degeneration. Disc bulge. Mild endplate spurring.  Facet arthrosis/ligamentum flavum hypertrophy. Bilateral subarticular narrowing (mild right, mild/moderate left) with some crowding of the left greater than right descending S1 nerve roots. Mild relative bilateral neural foraminal narrowing. At L3-L4, there is trace retrolisthesis. Mild disc degeneration. Superimposed shallow broad-based right subarticular to right foraminal disc protrusion. Mild right subarticular narrowing without frank nerve root  impingement. Minimal relative right neural foraminal narrowing without nerve root impingement. No significant spinal canal or neural foraminal stenosis at the remaining levels. Electronically Signed   By: Kellie Simmering DO   On: 02/17/2021 17:01   DG FL GUIDED LUMBAR PUNCTURE  Result Date: 02/18/2021 CLINICAL DATA:  Neurosarcoidosis. EXAM: DIAGNOSTIC LUMBAR PUNCTURE UNDER FLUOROSCOPIC GUIDANCE COMPARISON:  None. FLUOROSCOPY TIME:  Radiation Exposure Index (if provided by the fluoroscopic device): 22.7 mGy. Number of Acquired Spot Images: 2. PROCEDURE: Informed consent was obtained from the patient prior to the procedure, including potential complications of headache, allergy, and pain. With the patient prone, the lower back was prepped with Betadine. 1% Lidocaine was used for local anesthesia. Under fluoroscopic guidance, lumbar puncture was performed at the L3-4 level using a 20 gauge needle with return of clear CSF. Ten ml of CSF were obtained for laboratory studies. Needle was removed and appropriate dressing was applied. The patient tolerated the procedure well and there were no apparent complications. IMPRESSION: Under fluoroscopic guidance, successful lumbar puncture was performed for diagnostic purposes. Electronically Signed   By: Marijo Conception M.D.   On: 02/18/2021 11:41   CT Angio Abd/Pel w/ and/or w/o  Result Date: 02/04/2021 CLINICAL DATA:  Obscure GI bleed, transfusion dependent anemia. EXAM: CTA ABDOMEN AND PELVIS WITHOUT AND WITH CONTRAST TECHNIQUE:  Multidetector CT imaging of the abdomen and pelvis was performed using the standard protocol during bolus administration of intravenous contrast. Multiplanar reconstructed images and MIPs were obtained and reviewed to evaluate the vascular anatomy. CONTRAST:  148mL OMNIPAQUE IOHEXOL 350 MG/ML SOLN COMPARISON:  12/07/2015 FINDINGS: VASCULAR Aorta: Mild calcified atheromatous plaque. No aneurysm, dissection, or stenosis. Celiac: Patent.  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 SMA: Patent without evidence of aneurysm, dissection, vasculitis or significant stenosis. Renals: Both renal arteries are patent without evidence of aneurysm, dissection, vasculitis, fibromuscular dysplasia or significant stenosis. IMA: Patent without evidence of aneurysm, dissection, vasculitis or significant stenosis. Inflow: Patent without evidence of aneurysm, dissection, vasculitis or significant stenosis. Proximal Outflow: Bilateral common femoral and visualized portions of the superficial and profunda femoral arteries are patent without evidence of aneurysm, dissection, vasculitis or significant stenosis. Veins: Patent hepatic veins, portal vein, SMV, splenic vein, bilateral renal veins, iliac venous system and IVC. No venous pathology identified. Review of the MIP images confirms the above findings. NON-VASCULAR Lower chest: No pleural or pericardial effusion. Minimal linear scarring in the lung bases. Hepatobiliary: No focal liver abnormality is seen. Status post cholecystectomy. No biliary dilatation. Pancreas: Unremarkable. No pancreatic ductal dilatation or surrounding inflammatory changes. Spleen: Normal in size without focal abnormality. Adrenals/Urinary Tract: Adrenal glands unremarkable. Normal renal enhancement. No hydronephrosis. Urinary bladder physiologically distended. Stomach/Bowel: Stomach is nondistended. 6.1 cm posterior gastric diverticulum (previously 4.9) containing fluid level and hyperdense material. No  associated wall thickening nor inflammatory change. The small bowel is nondilated. Normal appendix. The colon is nondilated, unremarkable. No evidence of active extravasation. Lymphatic: No abdominal or pelvic adenopathy. Reproductive: Uterus and bilateral adnexa are unremarkable. Other: No ascites.  No free air. Musculoskeletal: Lower lumbar spondylitic changes. No fracture or worrisome bone lesion. IMPRESSION: 1. No evidence of active extravasation into the bowel. 2. Slight interval enlargement of posterior gastric diverticulum without adjacent inflammatory/edematous change. Electronically Signed   By: Lucrezia Europe M.D.   On: 02/04/2021 13:40     Microbiology: Recent Results (from the past 240 hour(s))  Resp Panel by RT-PCR (Flu A&B, Covid) Nasopharyngeal Swab     Status: None  Collection Time: 02/16/21  8:11 PM   Specimen: Nasopharyngeal Swab; Nasopharyngeal(NP) swabs in vial transport medium  Result Value Ref Range Status   SARS Coronavirus 2 by RT PCR NEGATIVE NEGATIVE Final    Comment: (NOTE) SARS-CoV-2 target nucleic acids are NOT DETECTED.  The SARS-CoV-2 RNA is generally detectable in upper respiratory specimens during the acute phase of infection. The lowest concentration of SARS-CoV-2 viral copies this assay can detect is 138 copies/mL. A negative result does not preclude SARS-Cov-2 infection and should not be used as the sole basis for treatment or other patient management decisions. A negative result may occur with  improper specimen collection/handling, submission of specimen other than nasopharyngeal swab, presence of viral mutation(s) within the areas targeted by this assay, and inadequate number of viral copies(<138 copies/mL). A negative result must be combined with clinical observations, patient history, and epidemiological information. The expected result is Negative.  Fact Sheet for Patients:  EntrepreneurPulse.com.au  Fact Sheet for Healthcare  Providers:  IncredibleEmployment.be  This test is no t yet approved or cleared by the [place] FDA and  has been authorized for detection and/or diagnosis of SARS-CoV-2 by FDA under an Emergency Use Authorization (EUA). This EUA will remain  in effect (meaning this test can be used) for the duration of the COVID-19 declaration under Section 564(b)(1) of the Act, 21 U.S.C.section 360bbb-3(b)(1), unless the authorization is terminated  or revoked sooner.       Influenza A by PCR NEGATIVE NEGATIVE Final   Influenza B by PCR NEGATIVE NEGATIVE Final    Comment: (NOTE) The Xpert Xpress SARS-CoV-2/FLU/RSV plus assay is intended as an aid in the diagnosis of influenza from Nasopharyngeal swab specimens and should not be used as a sole basis for treatment. Nasal washings and aspirates are unacceptable for Xpert Xpress SARS-CoV-2/FLU/RSV testing.  Fact Sheet for Patients: EntrepreneurPulse.com.au  Fact Sheet for Healthcare Providers: IncredibleEmployment.be  This test is not yet approved or cleared by the [place] FDA and has been authorized for detection and/or diagnosis of SARS-CoV-2 by FDA under an Emergency Use Authorization (EUA). This EUA will remain in effect (meaning this test can be used) for the duration of the COVID-19 declaration under Section 564(b)(1) of the Act, 21 U.S.C. section 360bbb-3(b)(1), unless the authorization is terminated or revoked.  Performed at [facility], [address] [place], [place] [address]   CSF culture w Stat Gram Stain     Status: None (Preliminary result)   Collection Time: 02/18/21 10:51 AM   Specimen: CSF; Cerebrospinal Fluid  Result Value Ref Range Status   Specimen Description   Final    CSF Performed at [facility], [address], [place], [place] [address]    Special Requests   Final    NONE Performed at [facility], [address], [place], [place] [address]     Gram Stain   Final    NO ORGANISMS SEEN CYTOSPIN SMEAR WBC PRESENT, PREDOMINANTLY MONONUCLEAR Performed at [facility], [address] [place], [place] [address]    Culture   Final    NO GROWTH < 12 HOURS Performed at [facility] [address], [place],  [address]    Report Status PENDING  Incomplete     Labs: Basic Metabolic Panel: Recent Labs  Lab 02/16/21 1421 02/17/21 0614 02/18/21 0556 02/21/21 0432 02/23/21 0452  NA 138 139 137 134* 134*  K 4.5 4.3 4.3 4.0 4.6  CL 101 102 100 100 98  CO2 26 27  28 26 29   GLUCOSE 117* 104* 99 106* 95  BUN 14 14 14 14 16   CREATININE 0.59 0.46 0.38* 0.46 0.51  CALCIUM 10.0 9.0 9.0 9.2 9.3  MG  --   --   --  1.9 1.9   Liver Function Tests: Recent Labs  Lab 02/17/21 0614 02/21/21 0432 02/23/21 0452  AST 22 34 39  ALT 18 32 38  ALKPHOS 88 85 86  BILITOT 0.4 0.2* 0.4  PROT 7.2 9.5* 9.2*  ALBUMIN 3.9 3.4* 3.3*   No results for input(s): LIPASE, AMYLASE in the last 168 hours. No results for input(s): AMMONIA in the last 168 hours. CBC: Recent Labs  Lab 02/16/21 1421 02/17/21 0614 02/18/21 0556 02/21/21 0432 02/23/21 0452  WBC 11.8* 8.8 8.9 5.4 4.6  NEUTROABS 7.7  --   --   --  2.3  HGB 11.9* 11.0* 11.0* 11.1* 10.6*  HCT 40.0 36.2 37.0 36.5 35.7*  MCV 92.2 90.7 92.7 91.3 91.1  PLT 406* 342 312 247 242   Cardiac Enzymes: Recent Labs  Lab 02/17/21 2026 02/21/21 0432 02/23/21 0452  CKTOTAL 45 32* 39  CKMB 1.3  --   --    BNP: Invalid input(s): POCBNP CBG: No results for input(s): GLUCAP in the last 168 hours.  Time coordinating discharge:  36 minutes  Signed:  Orson Eva, DO [facility] Pager: [phone] 02/23/2021, 10:45 AM

## 2021-02-23 NOTE — TOC Transition Note (Signed)
Transition of Care [facility]) - CM/SW Discharge Note   Patient Details  Name: Alexa Nichols MRN: [id number] Date of Birth: 1945/12/09  [facility] CM/SW Contact:  Boneta Lucks, RN Phone Number: 02/23/2021, 11:44 AM   Clinical Narrative:  Patient medically ready to discharge to [facility]. RN to call report after COVID test. Medical necessity completed. TOC will call EMS when RN is ready.   Final next level of care: Skilled Nursing Facility Barriers to Discharge: Continued Medical Work up   Patient Goals and CMS Choice Patient states their goals for this hospitalization and ongoing recovery are:: to go to SNF. CMS Medicare.gov Compare Post Acute Care list provided to:: Patient Choice offered to / list presented to : Patient    Discharge Plan and Services      HH Arranged: PT   Date [place]: 02/17/21 Time [place]: 6962 Representative spoke with at [place]: Spoke to [employer] in [place], will fax orders.   Readmission Risk Interventions Readmission Risk Prevention Plan 02/23/2021 02/18/2021  Post Dischage Appt Complete -  Medication Screening - Complete  Transportation Screening - Complete  Some recent data might be hidden

## 2021-02-24 DIAGNOSIS — I700 Atherosclerosis of aorta: Secondary | ICD-10-CM | POA: Diagnosis not present

## 2021-02-24 DIAGNOSIS — G610 Guillain-Barre syndrome: Secondary | ICD-10-CM | POA: Diagnosis not present

## 2021-02-24 DIAGNOSIS — I10 Essential (primary) hypertension: Secondary | ICD-10-CM | POA: Diagnosis not present

## 2021-02-24 DIAGNOSIS — Z299 Encounter for prophylactic measures, unspecified: Secondary | ICD-10-CM | POA: Diagnosis not present

## 2021-02-24 DIAGNOSIS — M810 Age-related osteoporosis without current pathological fracture: Secondary | ICD-10-CM | POA: Diagnosis not present

## 2021-02-26 LAB — CSF CULTURE W GRAM STAIN
Culture: NO GROWTH
Gram Stain: NONE SEEN

## 2021-03-02 ENCOUNTER — Other Ambulatory Visit: Payer: Self-pay

## 2021-03-02 ENCOUNTER — Encounter (HOSPITAL_COMMUNITY): Payer: Self-pay

## 2021-03-02 ENCOUNTER — Inpatient Hospital Stay (HOSPITAL_COMMUNITY)
Admission: EM | Admit: 2021-03-02 | Discharge: 2021-03-06 | DRG: 378 | Disposition: A | Discharge status: Skilled Nursing Facility | Payer: Medicare Other | Source: Skilled Nursing Facility | Attending: Family Medicine | Admitting: Family Medicine

## 2021-03-02 DIAGNOSIS — Q2733 Arteriovenous malformation of digestive system vessel: Secondary | ICD-10-CM | POA: Diagnosis not present

## 2021-03-02 DIAGNOSIS — D62 Acute posthemorrhagic anemia: Secondary | ICD-10-CM | POA: Diagnosis present

## 2021-03-02 DIAGNOSIS — K921 Melena: Secondary | ICD-10-CM | POA: Diagnosis not present

## 2021-03-02 DIAGNOSIS — Z79899 Other long term (current) drug therapy: Secondary | ICD-10-CM

## 2021-03-02 DIAGNOSIS — F419 Anxiety disorder, unspecified: Secondary | ICD-10-CM | POA: Diagnosis present

## 2021-03-02 DIAGNOSIS — K5900 Constipation, unspecified: Secondary | ICD-10-CM | POA: Diagnosis not present

## 2021-03-02 DIAGNOSIS — F32A Depression, unspecified: Secondary | ICD-10-CM | POA: Diagnosis not present

## 2021-03-02 DIAGNOSIS — G894 Chronic pain syndrome: Secondary | ICD-10-CM | POA: Diagnosis present

## 2021-03-02 DIAGNOSIS — G610 Guillain-Barre syndrome: Secondary | ICD-10-CM | POA: Diagnosis present

## 2021-03-02 DIAGNOSIS — Z853 Personal history of malignant neoplasm of breast: Secondary | ICD-10-CM

## 2021-03-02 DIAGNOSIS — Z20822 Contact with and (suspected) exposure to covid-19: Secondary | ICD-10-CM | POA: Diagnosis present

## 2021-03-02 DIAGNOSIS — K922 Gastrointestinal hemorrhage, unspecified: Secondary | ICD-10-CM | POA: Diagnosis not present

## 2021-03-02 DIAGNOSIS — E785 Hyperlipidemia, unspecified: Secondary | ICD-10-CM | POA: Diagnosis not present

## 2021-03-02 DIAGNOSIS — K219 Gastro-esophageal reflux disease without esophagitis: Secondary | ICD-10-CM | POA: Diagnosis not present

## 2021-03-02 DIAGNOSIS — K31811 Angiodysplasia of stomach and duodenum with bleeding: Secondary | ICD-10-CM | POA: Diagnosis not present

## 2021-03-02 DIAGNOSIS — J449 Chronic obstructive pulmonary disease, unspecified: Secondary | ICD-10-CM | POA: Diagnosis present

## 2021-03-02 DIAGNOSIS — Z7951 Long term (current) use of inhaled steroids: Secondary | ICD-10-CM

## 2021-03-02 DIAGNOSIS — D649 Anemia, unspecified: Secondary | ICD-10-CM | POA: Diagnosis not present

## 2021-03-02 DIAGNOSIS — M6281 Muscle weakness (generalized): Secondary | ICD-10-CM | POA: Diagnosis not present

## 2021-03-02 DIAGNOSIS — M545 Low back pain, unspecified: Secondary | ICD-10-CM | POA: Diagnosis not present

## 2021-03-02 DIAGNOSIS — Z87891 Personal history of nicotine dependence: Secondary | ICD-10-CM | POA: Diagnosis not present

## 2021-03-02 DIAGNOSIS — Z7189 Other specified counseling: Secondary | ICD-10-CM | POA: Diagnosis not present

## 2021-03-02 DIAGNOSIS — K317 Polyp of stomach and duodenum: Secondary | ICD-10-CM | POA: Diagnosis not present

## 2021-03-02 DIAGNOSIS — Z803 Family history of malignant neoplasm of breast: Secondary | ICD-10-CM | POA: Diagnosis not present

## 2021-03-02 DIAGNOSIS — Z9049 Acquired absence of other specified parts of digestive tract: Secondary | ICD-10-CM | POA: Diagnosis not present

## 2021-03-02 DIAGNOSIS — R9431 Abnormal electrocardiogram [ECG] [EKG]: Secondary | ICD-10-CM | POA: Diagnosis not present

## 2021-03-02 DIAGNOSIS — R29898 Other symptoms and signs involving the musculoskeletal system: Secondary | ICD-10-CM | POA: Diagnosis not present

## 2021-03-02 DIAGNOSIS — K31819 Angiodysplasia of stomach and duodenum without bleeding: Secondary | ICD-10-CM | POA: Diagnosis not present

## 2021-03-02 DIAGNOSIS — R531 Weakness: Secondary | ICD-10-CM | POA: Diagnosis not present

## 2021-03-02 DIAGNOSIS — D509 Iron deficiency anemia, unspecified: Secondary | ICD-10-CM | POA: Diagnosis not present

## 2021-03-02 DIAGNOSIS — Z515 Encounter for palliative care: Secondary | ICD-10-CM | POA: Diagnosis not present

## 2021-03-02 DIAGNOSIS — R2689 Other abnormalities of gait and mobility: Secondary | ICD-10-CM | POA: Diagnosis not present

## 2021-03-02 DIAGNOSIS — R41841 Cognitive communication deficit: Secondary | ICD-10-CM | POA: Diagnosis not present

## 2021-03-02 HISTORY — DX: Guillain-Barre syndrome: G61.0

## 2021-03-02 LAB — COMPREHENSIVE METABOLIC PANEL
ALT: 24 U/L (ref 0–44)
AST: 32 U/L (ref 15–41)
Albumin: 2.8 g/dL — ABNORMAL LOW (ref 3.5–5.0)
Alkaline Phosphatase: 81 U/L (ref 38–126)
Anion gap: 8 (ref 5–15)
BUN: 24 mg/dL — ABNORMAL HIGH (ref 8–23)
CO2: 27 mmol/L (ref 22–32)
Calcium: 8.1 mg/dL — ABNORMAL LOW (ref 8.9–10.3)
Chloride: 99 mmol/L (ref 98–111)
Creatinine, Ser: 0.3 mg/dL — ABNORMAL LOW (ref 0.44–1.00)
GFR, Estimated: >60 mL/min (ref >60)
Glucose, Bld: 107 mg/dL — ABNORMAL HIGH (ref 70–99)
Potassium: 4.4 mmol/L (ref 3.5–5.1)
Sodium: 134 mmol/L — ABNORMAL LOW (ref 135–145)
Total Bilirubin: 0.3 mg/dL (ref 0.3–1.2)
Total Protein: 6.6 g/dL (ref 6.5–8.1)

## 2021-03-02 LAB — CBC WITH DIFFERENTIAL/PLATELET
Abs Immature Granulocytes: 0.17 10*3/uL — ABNORMAL HIGH (ref 0.00–0.07)
Basophils Absolute: 0 10*3/uL (ref 0.0–0.1)
Basophils Relative: 0 %
Eosinophils Absolute: 0.1 10*3/uL (ref 0.0–0.5)
Eosinophils Relative: 1 %
HCT: 17.4 % — ABNORMAL LOW (ref 36.0–46.0)
Hemoglobin: 5 g/dL — CL (ref 12.0–15.0)
Immature Granulocytes: 2 %
Lymphocytes Relative: 18 %
Lymphs Abs: 1.9 10*3/uL (ref 0.7–4.0)
MCH: 28.1 pg (ref 26.0–34.0)
MCHC: 28.7 g/dL — ABNORMAL LOW (ref 30.0–36.0)
MCV: 97.8 fL (ref 80.0–100.0)
Monocytes Absolute: 0.9 10*3/uL (ref 0.1–1.0)
Monocytes Relative: 9 %
Neutro Abs: 7.4 10*3/uL (ref 1.7–7.7)
Neutrophils Relative %: 70 %
Platelets: 335 10*3/uL (ref 150–400)
RBC: 1.78 MIL/uL — ABNORMAL LOW (ref 3.87–5.11)
RDW: 20.7 % — ABNORMAL HIGH (ref 11.5–15.5)
WBC: 10.4 10*3/uL (ref 4.0–10.5)
nRBC: 1.2 % — ABNORMAL HIGH (ref 0.0–0.2)

## 2021-03-02 LAB — PROTIME-INR
INR: 1 (ref 0.8–1.2)
Prothrombin Time: 13.5 seconds (ref 11.4–15.2)

## 2021-03-02 LAB — MRSA PCR SCREENING: MRSA by PCR: NEGATIVE

## 2021-03-02 LAB — RESP PANEL BY RT-PCR (FLU A&B, COVID) ARPGX2
Influenza A by PCR: NEGATIVE
Influenza B by PCR: NEGATIVE
SARS Coronavirus 2 by RT PCR: NEGATIVE

## 2021-03-02 LAB — POC OCCULT BLOOD, ED: Fecal Occult Bld: POSITIVE — AB

## 2021-03-02 LAB — LIPASE, BLOOD: Lipase: 68 U/L — ABNORMAL HIGH (ref 11–51)

## 2021-03-02 LAB — PREPARE RBC (CROSSMATCH)

## 2021-03-02 MED ORDER — ONDANSETRON HCL 4 MG PO TABS
4.0000 mg | ORAL_TABLET | Freq: Four times a day (QID) | ORAL | Status: DC | PRN
  Administered 2021-03-02: 4 mg via ORAL
  Filled 2021-03-02: qty 1

## 2021-03-02 MED ORDER — CHLORHEXIDINE GLUCONATE CLOTH 2 % EX PADS
6.0000 | MEDICATED_PAD | Freq: Every day | CUTANEOUS | Status: DC
  Administered 2021-03-03 – 2021-03-05 (×2): 6 via TOPICAL

## 2021-03-02 MED ORDER — SODIUM CHLORIDE 0.9 % IV SOLN
8.0000 mg/h | INTRAVENOUS | Status: AC
  Administered 2021-03-02 – 2021-03-04 (×5): 8 mg/h via INTRAVENOUS
  Filled 2021-03-02 (×8): qty 80

## 2021-03-02 MED ORDER — SERTRALINE HCL 50 MG PO TABS
25.0000 mg | ORAL_TABLET | Freq: Every day | ORAL | Status: DC
  Administered 2021-03-03 – 2021-03-05 (×3): 25 mg via ORAL
  Filled 2021-03-02 (×3): qty 1

## 2021-03-02 MED ORDER — UMECLIDINIUM BROMIDE 62.5 MCG/INH IN AEPB: 1.0000 | INHALATION_SPRAY | Freq: Every day | RESPIRATORY_TRACT | Status: DC

## 2021-03-02 MED ORDER — ALBUTEROL SULFATE (2.5 MG/3ML) 0.083% IN NEBU: 2.5000 mg | INHALATION_SOLUTION | Freq: Four times a day (QID) | RESPIRATORY_TRACT | Status: DC | PRN

## 2021-03-02 MED ORDER — ALPRAZOLAM 0.25 MG PO TABS
0.2500 mg | ORAL_TABLET | Freq: Every day | ORAL | Status: DC | PRN
  Administered 2021-03-02 – 2021-03-05 (×4): 0.25 mg via ORAL
  Filled 2021-03-02 (×4): qty 1

## 2021-03-02 MED ORDER — SODIUM CHLORIDE 0.9 % IV SOLN: INTRAVENOUS | Status: DC

## 2021-03-02 MED ORDER — UMECLIDINIUM BROMIDE 62.5 MCG/INH IN AEPB
1.0000 | INHALATION_SPRAY | Freq: Every day | RESPIRATORY_TRACT | Status: DC
  Administered 2021-03-03 – 2021-03-05 (×3): 1 via RESPIRATORY_TRACT
  Filled 2021-03-02: qty 7

## 2021-03-02 MED ORDER — ACETAMINOPHEN 500 MG PO TABS
500.0000 mg | ORAL_TABLET | Freq: Four times a day (QID) | ORAL | Status: DC | PRN
  Administered 2021-03-04 (×2): 500 mg via ORAL
  Filled 2021-03-02 (×2): qty 1

## 2021-03-02 MED ORDER — FUROSEMIDE 10 MG/ML IJ SOLN
30.0000 mg | Freq: Once | INTRAMUSCULAR | Status: AC
  Administered 2021-03-02: 30 mg via INTRAVENOUS
  Filled 2021-03-02: qty 4

## 2021-03-02 MED ORDER — OCUVITE-LUTEIN PO CAPS
1.0000 | ORAL_CAPSULE | Freq: Two times a day (BID) | ORAL | Status: DC
  Administered 2021-03-02 – 2021-03-05 (×6): 1 via ORAL
  Filled 2021-03-02 (×7): qty 1

## 2021-03-02 MED ORDER — PANTOPRAZOLE SODIUM 40 MG IV SOLR
40.0000 mg | Freq: Two times a day (BID) | INTRAVENOUS | Status: DC
  Filled 2021-03-02 (×2): qty 40

## 2021-03-02 MED ORDER — GABAPENTIN 300 MG PO CAPS
300.0000 mg | ORAL_CAPSULE | Freq: Three times a day (TID) | ORAL | Status: DC
  Administered 2021-03-02 – 2021-03-05 (×9): 300 mg via ORAL
  Filled 2021-03-02 (×10): qty 1

## 2021-03-02 MED ORDER — SODIUM CHLORIDE 0.9 % IV SOLN: 10.0000 mL/h | Freq: Once | INTRAVENOUS | Status: DC

## 2021-03-02 MED ORDER — OXYCODONE HCL 5 MG PO TABS
10.0000 mg | ORAL_TABLET | Freq: Three times a day (TID) | ORAL | Status: DC | PRN
  Administered 2021-03-02 – 2021-03-05 (×5): 10 mg via ORAL
  Filled 2021-03-02 (×5): qty 2

## 2021-03-02 MED ORDER — FLUTICASONE-UMECLIDIN-VILANT 200-62.5-25 MCG/INH IN AEPB: 1.0000 | INHALATION_SPRAY | Freq: Every day | RESPIRATORY_TRACT | Status: DC

## 2021-03-02 MED ORDER — FLUTICASONE FUROATE-VILANTEROL 200-25 MCG/INH IN AEPB
1.0000 | INHALATION_SPRAY | Freq: Every day | RESPIRATORY_TRACT | Status: DC
  Administered 2021-03-03 – 2021-03-05 (×3): 1 via RESPIRATORY_TRACT
  Filled 2021-03-02: qty 28

## 2021-03-02 MED ORDER — SODIUM CHLORIDE 0.9 % IV SOLN
80.0000 mg | Freq: Once | INTRAVENOUS | Status: AC
  Administered 2021-03-02: 80 mg via INTRAVENOUS
  Filled 2021-03-02: qty 80

## 2021-03-02 MED ORDER — LACTATED RINGERS IV SOLN: INTRAVENOUS | Status: DC

## 2021-03-02 MED ORDER — MONTELUKAST SODIUM 10 MG PO TABS
10.0000 mg | ORAL_TABLET | Freq: Every day | ORAL | Status: DC
  Administered 2021-03-02 – 2021-03-05 (×4): 10 mg via ORAL
  Filled 2021-03-02 (×4): qty 1

## 2021-03-02 MED ORDER — ONDANSETRON HCL 4 MG/2ML IJ SOLN: 4.0000 mg | Freq: Four times a day (QID) | INTRAMUSCULAR | Status: DC | PRN

## 2021-03-02 MED ORDER — FLUTICASONE FUROATE-VILANTEROL 200-25 MCG/INH IN AEPB: 1.0000 | INHALATION_SPRAY | Freq: Every day | RESPIRATORY_TRACT | Status: DC

## 2021-03-02 NOTE — ED Triage Notes (Signed)
Pt brought to ED via [facility] from [facility] for abnormal hemoglobin. Pt HGB 4.9. Pt denies vomiting and blood in stools.

## 2021-03-02 NOTE — H&P (Signed)
History and Physical    Alexa Nichols KVQ:[id number] DOB: 30-Nov-1945 DOA: 03/02/2021  PCP: [name], MD   Patient coming from: Nursing home  I have personally briefly reviewed patient's old medical records in [facility]  Chief Complaint: Increased fatigue and low hemoglobin level.  HPI: Alexa Nichols is a 75 y.o. female with medical history significant of asthma/COPD, depression/anxiety, history of Gilliam Barr syndrome, gastroesophageal flux disease, history of GI bleed secondary to gastric/duodenal AVMs and iron deficiency anemia; who presented to the hospital secondary to increased weakness.  Patient with recent admission secondary to [name] syndrome discharge to a skilled nursing facility for further care and rehabilitation.  During blood work routinely done at her skilled nursing facility patient was found to have a significantly low hemoglobin of 4.9.  Patient expressed increased fatigue, shortness of breath with activity and generalized weakness.  No chest pain, no nausea, no vomiting, no hematemesis, no abdominal pain, no focal weakness or sick contacts.  In the ED COVID PCR negative  ED Course: CBC demonstrating hemoglobin of 5.0; patient with soft blood pressure and appreciated tachycardia.  Otherwise hemodynamically stable and in no acute distress.  Patient was typed and crossed with check of antibodies but demonstrated to be positive and needing her blood to be transferred from [facility].  GI service was consulted, IV fluids and 2 large IV bore were requested. TRH contacted to place in the hospital for further evaluation and management.  Review of Systems: As per HPI otherwise all other systems reviewed and are negative.   Past Medical History:  Diagnosis Date  . Asthma   . Chronic low back pain   . COPD (chronic obstructive pulmonary disease) ([place])   . Depression   . GERD (gastroesophageal reflux disease)   . Guillain Barr syndrome ([place])    . Heart murmur   . Lobular carcinoma of left breast ([place]) 1997   in situ  . Other and unspecified hyperlipidemia   . Seasonal allergies     Past Surgical History:  Procedure Laterality Date  . BIOPSY  12/03/2020   Procedure: BIOPSY;  Surgeon: Rogene Houston, MD;  [facility];  Service: Endoscopy;;  gastric polyps biopsies;  . BREAST SURGERY Left    lumpectomy   . CESAREAN SECTION  1986  . CHOLECYSTECTOMY    . COLONOSCOPY WITH PROPOFOL N/A 12/03/2020    two 4-7 mm polyps in cecum, one small polyp in cecum, one 5 mm polyp, sigmoid diverticulosis, path with tubular adenomas  . ESOPHAGOGASTRODUODENOSCOPY (EGD) WITH PROPOFOL N/A 12/03/2020   normal esophagus, multiple gastric polyps s/p biopsy, small paraesophageal hernia, normal duodenum. Fundic gland polyps.  . ESOPHAGOGASTRODUODENOSCOPY (EGD) WITH PROPOFOL N/A 02/05/2021   Procedure: ESOPHAGOGASTRODUODENOSCOPY (EGD) WITH PROPOFOL;  Surgeon: [name], MD;  Location[facility];  Service: Endoscopy;  Laterality: N/A;  . GIVENS CAPSULE STUDY N/A 12/16/2020   normal small bowel capsule but rapid transit time of 21 minutes. Recommended to resume alendronate.   Marland Kitchen GIVENS CAPSULE STUDY N/A 02/04/2021   Procedure: GIVENS CAPSULE STUDY;  Surgeon: Rogene Houston, MD;  [facility];  Service: Endoscopy;  Laterality: N/A;  . HOT HEMOSTASIS  02/05/2021   Procedure: HOT HEMOSTASIS (ARGON PLASMA COAGULATION/BICAP);  Surgeon: [name], MD;  [facility];  Service: Endoscopy;;  . TONSILLECTOMY     age 25    Social History  reports that she quit smoking about 37 years ago. Her smoking  use included cigarettes. She started smoking about 56 years ago. She has a 10.00 pack-year smoking history. She has never used smokeless tobacco. She reports that she does not drink alcohol and does not use drugs.  Allergies  Allergen Reactions  . Aspirin     samter's syndrome  . Codeine Itching  . Sulfa Antibiotics      Unknown reaction  . Other Rash    gel filled fentanyl patch, adhesive on that patch caused ithcing    Family History  Problem Relation Age of Onset  . Osteoporosis Mother   . Other Father        head trauma  . Breast cancer Sister        X's 2 sisters    Prior to Admission medications   Medication Sig Start Date End Date Taking? Authorizing Provider  acetaminophen (TYLENOL) 500 MG tablet Take 500 mg by mouth every 6 (six) hours as needed for moderate pain or headache.   Yes [provider]  albuterol (ACCUNEB) 0.63 MG/3ML nebulizer solution Take 1 ampule by nebulization every 6 (six) hours as needed for wheezing.   Yes [provider]  ALPRAZolam Duanne Moron) 0.5 MG tablet Take 0.25 mg by mouth daily as needed for anxiety.   Yes [provider]  ferrous sulfate 325 (65 FE) MG tablet Take 1 tablet (325 mg total) by mouth daily with breakfast. 02/08/21  Yes [name], [name], MD  gabapentin (NEURONTIN) 300 MG capsule Take 1 capsule (300 mg total) by mouth 3 (three) times daily. 02/23/21  Yes Tat, Shanon Brow, MD  montelukast (SINGULAIR) 10 MG tablet Take 10 mg by mouth at bedtime.   Yes [provider]  Multiple Vitamins-Minerals (PRESERVISION AREDS 2) CAPS Take 1 capsule by mouth in the morning and at bedtime.   Yes [provider]  oxyCODONE 10 MG TABS Take 1 tablet (10 mg total) by mouth every 6 (six) hours as needed for moderate pain. 02/23/21  Yes Tat, Shanon Brow, MD  pantoprazole (PROTONIX) 40 MG tablet Take 1 tablet (40 mg total) by mouth 2 (two) times daily. 02/07/21 05/08/21 Yes Shah, Pratik D, DO  sertraline (ZOLOFT) 50 MG tablet Take 25 mg by mouth daily. 01/26/16  Yes [provider]  simvastatin (ZOCOR) 20 MG tablet Take 20 mg by mouth daily.   Yes [provider]  TRELEGY ELLIPTA 200-62.5-25 MCG/INH AEPB Inhale 1 puff into the lungs daily. 12/21/20  Yes [provider]  vitamin B-12 (CYANOCOBALAMIN) 500 MCG tablet Take 1  tablet (500 mcg total) by mouth daily. 02/24/21  Yes Tat, Shanon Brow, MD  tiZANidine (ZANAFLEX) 4 MG tablet Take 2 mg by mouth at bedtime as needed for muscle spasms. Patient not taking: Reported on 03/02/2021    [provider]    Physical Exam: Vitals:   03/02/21 1730 03/02/21 1736 03/02/21 1745 03/02/21 1800  BP:  (!) 111/53 (!) 96/57 (!) 105/49  Pulse: 98 (!) 101 99 (!) 104  Resp: 19 20 (!) 23 17  Temp:   98.6 F (37 C)   TempSrc:   Oral   SpO2: 98% 98% 99% 100%  Weight:      Height:        Constitutional: Afebrile, no chest pain, no nausea, no vomiting, no shortness of breath, no acute distress. Vitals:   03/02/21 1730 03/02/21 1736 03/02/21 1745 03/02/21 1800  BP:  (!) 111/53 (!) 96/57 (!) 105/49  Pulse: 98 (!) 101 99 (!) 104  Resp: 19 20 (!) 23  17  Temp:   98.6 F (37 C)   TempSrc:   Oral   SpO2: 98% 98% 99% 100%  Weight:      Height:       Eyes: PERRL, lids normal; pale conjunctiva; no icterus, no nystagmus. ENMT: Mucous membranes are moist. Posterior pharynx clear of any exudate or lesions. Neck: normal, supple, no masses, no thyromegaly Respiratory: clear to auscultation bilaterally, no wheezing, no crackles. Normal respiratory effort. No accessory muscle use.  Cardiovascular: Regular rate and rhythm, no murmurs / rubs / gallops. No extremity edema. 2+ pedal pulses. No carotid bruits.  Abdomen: no tenderness, no masses palpated. No hepatosplenomegaly. Bowel sounds positive.  Musculoskeletal: no clubbing / cyanosis. No joint deformity upper and lower extremities. Good ROM, no contractures. Normal muscle tone.  Skin: no rashes, lesions, ulcers. No induration Neurologic: CN 2-12 grossly intact. Sensation intact, DTR normal. Strength 5/5 in all 4.  Psychiatric: Normal judgment and insight. Alert and oriented x 3. Normal mood.    Labs on Admission: I have personally reviewed following labs and imaging studies  CBC: Recent Labs  Lab 03/02/21 0859  WBC 10.4   NEUTROABS 7.4  HGB 5.0*  HCT 17.4*  MCV 97.8  PLT 591    Basic Metabolic Panel: Recent Labs  Lab 03/02/21 0859  NA 134*  K 4.4  CL 99  CO2 27  GLUCOSE 107*  BUN 24*  CREATININE 0.30*  CALCIUM 8.1*    GFR: Estimated Creatinine Clearance: 49.9 mL/min (A) (by C-G formula based on SCr of 0.3 mg/dL (L)).  Liver Function Tests: Recent Labs  Lab 03/02/21 0859  AST 32  ALT 24  ALKPHOS 81  BILITOT 0.3  PROT 6.6  ALBUMIN 2.8*    Urine analysis:    Component Value Date/Time   COLORURINE STRAW (A) 02/16/2021 1600   APPEARANCEUR CLEAR 02/16/2021 1600   LABSPEC 1.008 02/16/2021 1600   PHURINE 5.0 02/16/2021 1600   GLUCOSEU NEGATIVE 02/16/2021 1600   HGBUR NEGATIVE 02/16/2021 1600   BILIRUBINUR NEGATIVE 02/16/2021 1600   KETONESUR NEGATIVE 02/16/2021 1600   PROTEINUR NEGATIVE 02/16/2021 1600   NITRITE NEGATIVE 02/16/2021 1600   LEUKOCYTESUR NEGATIVE 02/16/2021 1600    Radiological Exams on Admission: No results found.  EKG: Independently reviewed.  Sinus rhythm; no acute ischemic changes.  Assessment/Plan 1-Symptomatic anemia -Presenting with soft blood pressure, tachycardia and generalized weakness -Patient found with melanotic appearance in her stools and positive fecal occult blood test -Hemoglobin 5.0 on presentation -Prior history of GI bleed secondary to gastric and duodenal AVMs. -TRH contacted to place patient in the hospital for blood transfusion, fluid resuscitation and to stabilize condition -GI service has been consulted -Tolerated reports has been requested. -Started on PPI infusion treatment -Plan is for endoscopy on 03/03/2021 -Transfusion of 3 unit PRBCs has been requested -Follow hemoglobin trend.  2-COPD (chronic obstructive pulmonary disease) (HCC) -No wheezing, no using accessory muscles -Continue home bronchodilator management. -Good oxygen saturation on room air  3-Depression/anxiety -No suicidal ideation hallucination -Resume the  use of Zoloft -Continue as needed alprazolam  4-chronic pain syndrome -Continue as needed Tylenol, Neurontin and oxycodone.  5-history of [name] -Continue Neurontin -Continue outpatient follow-up with neurology service.   DVT prophylaxis: SCDs Code Status:   Full code Family Communication:  No family member at bedside. Disposition Plan:   Patient is from:  SNF  Anticipated DC to:  To be determined  Anticipated DC date:  To be determined  Anticipated DC barriers: Stabilization of hemoglobin  level and control of active bleeding. Consults called:  Gastroenterology service (Dr. Gala Romney).  Palliative care service (goals of care and advance directives). Admission status:  Inpatient, stepdown unit; length of stay more than 2 midnights.  Severity of Illness: Severe illness; patient with acute on chronic blood loss anemia most likely in the setting of AVMs.  Presented with significant weakness and a hemoglobin of 5.0.  Slightly tachycardic and a stable blood pressure.  Patient typed and crossed demonstrating presence of blood antibodies making it difficult and delayed transfusion.  No abdominal pain, no hematemesis positive dark stools but chronically on iron supplementation and positive fecal occult blood test.  GI service has been consulted planning for endoscopic evaluation and the possibility for push enteroscopy on 03/03/2021.  Patient will be fluid resuscitated, transfuse of 3 units PRBCs and closely monitor hemoglobin trend.    Barton Dubois MD [facility]  How to contact the [facility] Attending or Consulting provider [place] or covering provider during after hours [place], for this patient?   1. Check the care team in [facility] and look for a) attending/consulting TRH provider listed and b) the [facility] team listed 2. Log into [email or web address] and use [place] universal password to access. If you do not have the password, please contact the hospital operator. 3. Locate the [facility] provider  you are looking for under [facility] and page to a number that you can be directly reached. 4. If you still have difficulty reaching the provider, please page the [facility] (Director on Call) for the Hospitalists listed on amion for assistance.  03/02/2021, 6:16 PM

## 2021-03-02 NOTE — Progress Notes (Signed)
Patient consented to EGD tomorrow 5/31, to be performed by Dr. Jenetta Downer.  Signed consent placed in patient's chart.

## 2021-03-02 NOTE — Progress Notes (Signed)
UNMATCHED BLOOD PRODUCT NOTE  Compare the patient ID on the blood tag to the patient ID on the hospital armband and Blood Bank armband. Then confirm the unit number on the blood tag matches the unit number on the blood product.  If a discrepancy is discovered return the product to blood bank immediately.   Blood Product Type: PRBCs Unit # [id number]   Start Time: 1594  Starting Rate: 120 mL/hr  Will continue to run at 120 mL/hr per order for each until to run for 3 hours.    Stop Time: (Has not stopped yet, currently infusing)  All Other Documentation should be documented within the Blood Admin Flowsheet per policy.  This unit of PRBCs was verified by 3 RNs and Blood bank twice.

## 2021-03-02 NOTE — ED Provider Notes (Signed)
Emergency Department Provider Note   I have reviewed the triage vital signs and the nursing notes.   HISTORY  Chief Complaint Abnormal Lab   HPI Alexa Nichols is a 75 y.o. female with past medical history of Guillain-Barr, chronic GI bleeding, COPD presents to the emergency department with abnormal lab work.  She is living at a nursing facility and labs were drawn this morning which reportedly resulted with a hemoglobin less than 5.  Patient states that she has been feeling especially fatigued over the past several days.  She is not aware of any gross blood or melena but notes that because of her weakness she is unable to go to the bathroom on her own.  Staff did not report any such findings.  She not having any abdominal pain.  She is not vomiting blood or otherwise.  No congestion, cough, shortness of breath symptoms.    Patient has required multiple blood transfusions in the recent past and most recently performed a pill endoscopy with Dr. Gala Romney earlier this month showing coffee ground material in the stomach and AVMs in the small bowel.    Past Medical History:  Diagnosis Date  . Asthma   . Chronic low back pain   . COPD (chronic obstructive pulmonary disease) ([place])   . Depression   . GERD (gastroesophageal reflux disease)   . Guillain Barr syndrome ([place])   . Heart murmur   . Lobular carcinoma of left breast ([place]) 1997   in situ  . Other and unspecified hyperlipidemia   . Seasonal allergies     Patient Active Problem List   Diagnosis Date Noted  . Symptomatic anemia 03/02/2021  . AIDP (acute inflammatory demyelinating polyneuropathy) ([place]) 02/18/2021  . Lower extremity weakness 02/16/2021  . Acute blood loss anemia 02/04/2021  . Anemia 02/03/2021  . COPD (chronic obstructive pulmonary disease) ([place]) 02/03/2021  . Depression 02/03/2021  . Intermediate stage nonexudative age-related macular degeneration of left eye 12/18/2020  . Microcytic anemia 11/05/2020  .  Melena 11/05/2020  . Early stage nonexudative age-related macular degeneration of left eye 07/21/2020  . Degenerative retinal drusen of right eye 02/14/2020  . Early stage nonexudative age-related macular degeneration of right eye 02/14/2020  . Exudative age-related macular degeneration of right eye with active choroidal neovascularization ([place]) 02/14/2020  . History of vitrectomy 02/14/2020  . Chest pain 01/15/2014  . Mitral regurgitation 01/15/2014  . Hyperlipidemia 01/15/2014    Past Surgical History:  Procedure Laterality Date  . BIOPSY  12/03/2020   Procedure: BIOPSY;  Surgeon: Rogene Houston, MD;  [facility];  Service: Endoscopy;;  gastric polyps biopsies;  . BREAST SURGERY Left    lumpectomy   . CESAREAN SECTION  1986  . CHOLECYSTECTOMY    . COLONOSCOPY WITH PROPOFOL N/A 12/03/2020    two 4-7 mm polyps in cecum, one small polyp in cecum, one 5 mm polyp, sigmoid diverticulosis, path with tubular adenomas  . ESOPHAGOGASTRODUODENOSCOPY (EGD) WITH PROPOFOL N/A 12/03/2020   normal esophagus, multiple gastric polyps s/p biopsy, small paraesophageal hernia, normal duodenum. Fundic gland polyps.  . ESOPHAGOGASTRODUODENOSCOPY (EGD) WITH PROPOFOL N/A 02/05/2021   Procedure: ESOPHAGOGASTRODUODENOSCOPY (EGD) WITH PROPOFOL;  Surgeon: [name], MD;  Location: [facility];  Service: Endoscopy;  Laterality: N/A;  . GIVENS CAPSULE STUDY N/A 12/16/2020   normal small bowel capsule but rapid transit time of 21 minutes. Recommended to resume alendronate.   Marland Kitchen GIVENS CAPSULE STUDY N/A 02/04/2021   Procedure: GIVENS CAPSULE STUDY;  Surgeon: Rogene Houston, MD;  [facility];  Service: Endoscopy;  Laterality: N/A;  . HOT HEMOSTASIS  02/05/2021   Procedure: HOT HEMOSTASIS (ARGON PLASMA COAGULATION/BICAP);  Surgeon: [name], MD;  Location: [facility];  Service: Endoscopy;;  . TONSILLECTOMY     age 34    Allergies Aspirin, Codeine, Sulfa antibiotics, and  Other  Family History  Problem Relation Age of Onset  . Osteoporosis Mother   . Other Father        head trauma  . Breast cancer Sister        X's 2 sisters    Social History Social History   Tobacco Use  . Smoking status: Former Smoker    Packs/day: 0.50    Years: 20.00    Pack years: 10.00    Types: Cigarettes    Start date: 09/16/1964    Quit date: 10/05/1983    Years since quitting: 37.4  . Smokeless tobacco: Never Used  Vaping Use  . Vaping Use: Never used  Substance Use Topics  . Alcohol use: Never    Alcohol/week: 0.0 standard drinks  . Drug use: Never    Review of Systems  Constitutional: No fever/chills. Positive generalized weakness.  Eyes: No visual changes. ENT: No sore throat. Cardiovascular: Denies chest pain. Respiratory: Denies shortness of breath. Gastrointestinal: No abdominal pain.  No nausea, no vomiting.  No diarrhea.  No constipation. Genitourinary: Negative for dysuria. Musculoskeletal: Negative for back pain. Skin: Negative for rash. Neurological: Negative for headaches, focal weakness or numbness.  10-point ROS otherwise negative.  ____________________________________________   PHYSICAL EXAM:  VITAL SIGNS: ED Triage Vitals  Enc Vitals Group     BP 03/02/21 0843 101/61     Pulse Rate 03/02/21 0843 98     Resp 03/02/21 0843 20     Temp 03/02/21 0843 98.4 F (36.9 C)     Temp Source 03/02/21 0843 Oral     SpO2 03/02/21 0843 97 %     Weight 03/02/21 0840 138 lb (62.6 kg)     Height 03/02/21 0840 4\' 10"  (1.473 m)   Constitutional: Alert and oriented. Very weak appearing and pale.  Eyes: Conjunctivae are normal.  Head: Atraumatic. Nose: No congestion/rhinnorhea. Mouth/Throat: Mucous membranes are moist.  Neck: No stridor.   Cardiovascular: Mild tachycardia. Good peripheral circulation. Grossly normal heart sounds.   Respiratory: Normal respiratory effort.  No retractions. Lungs CTAB. Gastrointestinal: Soft and nontender. No  distention.  Rectal exam performed with patient's verbal consent and with nurse chaperone.  Melena noted without gross blood.  Musculoskeletal: No lower extremity tenderness nor edema. No gross deformities of extremities. Neurologic:  Normal speech and language. Diffusely weak but equal strength. Normal sensation. No facial asymmetry.  Skin:  Skin is warm, dry and intact. No rash noted.   ____________________________________________   LABS (all labs ordered are listed, but only abnormal results are displayed)  Labs Reviewed  COMPREHENSIVE METABOLIC PANEL - Abnormal; Notable for the following components:      Result Value   Sodium 134 (*)    Glucose, Bld 107 (*)    BUN 24 (*)    Creatinine, Ser 0.30 (*)    Calcium 8.1 (*)    Albumin 2.8 (*)    All other components within normal limits  LIPASE, BLOOD - Abnormal; Notable for the following components:   Lipase 68 (*)    All other components within normal limits  CBC WITH DIFFERENTIAL/PLATELET - Abnormal; Notable for the  following components:   RBC 1.78 (*)    Hemoglobin 5.0 (*)    HCT 17.4 (*)    MCHC 28.7 (*)    RDW 20.7 (*)    nRBC 1.2 (*)    Abs Immature Granulocytes 0.17 (*)    All other components within normal limits  POC OCCULT BLOOD, ED - Abnormal; Notable for the following components:   Fecal Occult Bld POSITIVE (*)    All other components within normal limits  RESP PANEL BY RT-PCR (FLU A&B, COVID) ARPGX2  PROTIME-INR  TYPE AND SCREEN  PREPARE RBC (CROSSMATCH)  PREPARE RBC (CROSSMATCH)   ____________________________________________  EKG   EKG Interpretation  Date/Time:  Monday Mar 02 2021 08:47:32 EDT Ventricular Rate:  98 PR Interval:  150 QRS Duration: 125 QT Interval:  406 QTC Calculation: 519 R Axis:   -18 Text Interpretation: Sinus rhythm Left bundle branch block Similar to prior tracing Confirmed by Nanda Quinton [phone]) on 03/02/2021 9:02:23 AM        ____________________________________________  RADIOLOGY  No results found.  ____________________________________________   PROCEDURES  Procedure(s) performed:   Procedures  CRITICAL CARE Performed by: Margette Fast Total critical care time: 35 minutes Critical care time was exclusive of separately billable procedures and treating other patients. Critical care was necessary to treat or prevent imminent or life-threatening deterioration. Critical care was time spent personally by me on the following activities: development of treatment plan with patient and/or surrogate as well as nursing, discussions with consultants, evaluation of patient's response to treatment, examination of patient, obtaining history from patient or surrogate, ordering and performing treatments and interventions, ordering and review of laboratory studies, ordering and review of radiographic studies, pulse oximetry and re-evaluation of patient's condition.  Nanda Quinton, MD Emergency Medicine  ____________________________________________   INITIAL IMPRESSION / ASSESSMENT AND PLAN / ED COURSE  Pertinent labs & imaging results that were available during my care of the patient were reviewed by me and considered in my medical decision making (see chart for details).   Patient presents to the emergency department with abnormal labs.  Lab work this morning showed significant reduction in hemoglobin.  She has known GI bleeding and is followed by Dr. Gala Romney.  She does take iron supplements.  She is not anticoagulated.  She has mild tachycardia.  Blood pressures are low normal.  Hemoccult is positive.  We will start Protonix with bolus followed by infusion along with IV infusion of saline.  Labs will be repeated and type and screen sent.  Patient is okay with receiving additional blood transfusions if needed.   10:40 AM  Spoke with Dr. Gala Romney regarding the patient's presentation.  Hemoglobin is 5.  And type and screen  the lab is calling saying the patient has antibodies To send her sample to [facility] for [facility] testing.  Dr. Gala Romney recommends resuscitation with PPI which is already started.  He will consult on the patient this afternoon.   Discussed patient's case with [facility], Dr. Dyann Kief to request admission. Patient and family (if present) updated with plan. Care transferred to [facility] service.  I reviewed all nursing notes, vitals, pertinent old records, EKGs, labs, imaging (as available).  I did speak with the blood bank and they will alert [facility] that we are requesting 3 units rather than 2 units of crossmatched blood.  There is some delay as the patient does have antibodies. They will arrange for 3 units to be sent. Have also adjusted run rate slower with 1 unit  over 3 hours.   ____________________________________________  FINAL CLINICAL IMPRESSION(S) / ED DIAGNOSES  Final diagnoses:  Gastrointestinal hemorrhage with melena  Symptomatic anemia     MEDICATIONS GIVEN DURING THIS VISIT:  Medications  pantoprazole (PROTONIX) 80 mg in sodium chloride 0.9 % 100 mL (0.8 mg/mL) infusion (8 mg/hr Intravenous New Bag/Given 03/02/21 1145)  pantoprazole (PROTONIX) injection 40 mg (has no administration in time range)  0.9 %  sodium chloride infusion (has no administration in time range)  0.9 %  sodium chloride infusion (has no administration in time range)  0.9 %  sodium chloride infusion (has no administration in time range)  pantoprazole (PROTONIX) 80 mg in sodium chloride 0.9 % 100 mL IVPB (0 mg Intravenous Stopped 03/02/21 1142)    Note:  This document was prepared using Dragon voice recognition software and may include unintentional dictation errors.  Nanda Quinton, MD, [facility] Emergency Medicine    Roshni Burbano, [facility], MD 03/02/21 [phone]

## 2021-03-02 NOTE — ED Notes (Signed)
Date and time results received: 03/02/21 0923  Test: 5.0 Critical Value: Hgb  Name of Provider Notified: EDP, Long  Orders Received? Or Actions Taken?: N/A

## 2021-03-02 NOTE — Consult Note (Signed)
Referring Provider: No ref. provider found Primary Care Physician:  [name], MD Primary Gastroenterologist:  Dr. Gala Romney  Reason for Consultation:  Recurrent GIB  HPI: 75 year old lady nursing home resident with a history of Guillain-Barr and recurrent GI bleeding brought to the emergency department this morning after being found to have an extremely low hemoglobin (5) on blood work.  Patient notes progressive fatigue over the past couple of weeks.  Hemodynamically stable in the ED.  She has been admitted to the ICU for further evaluation. EDP inform the patient does have antibodies to crossmatch.  Blood will have to be brought from [facility].  3 units have been requested.  This lady's recent GI history is significant for overt (melena) and occult GI bleeding of obscure etiology although recently localized to the stomach and small bowel on video capsule.  She has received multiple units of blood during hospitalizations at [place] back in December of last year and earlier this year here. In March she underwent EGD and colonoscopy Dr. Laural Golden.  Fundal gland polyps were found along with small colonic adenomas which were removed.  Due to recurrent bleeding, she underwent a capsule study which was inconclusive due to rapid transit.  This was repeated about a month ago which revealed coffee-ground material in the stomach and possible erosions in the small intestine.  I performed an EGD which revealed a couple of antral and proximal duodenal AVMs which were ablated with APC.  Again, multiple fundal gland polyps were found along with an enlarging gastric diverticulum.  A CTA was also negative for localization of site of bleeding.  It is notable the patient does take an occasional Aleve/ibuprofen.  She has a history of GERD and has been maintained on pantoprazole 40 mg twice daily.  She denies any recent abdominal pain, melena or rectal bleeding or other change in bowel function.  Past  Medical History:  Diagnosis Date  . Asthma   . Chronic low back pain   . COPD (chronic obstructive pulmonary disease) ([place])   . Depression   . GERD (gastroesophageal reflux disease)   . Guillain Barr syndrome ([place])   . Heart murmur   . Lobular carcinoma of left breast ([place]) 1997   in situ  . Other and unspecified hyperlipidemia   . Seasonal allergies     Past Surgical History:  Procedure Laterality Date  . BIOPSY  12/03/2020   Procedure: BIOPSY;  Surgeon: Rogene Houston, MD;  [facility];  Service: Endoscopy;;  gastric polyps biopsies;  . BREAST SURGERY Left    lumpectomy   . CESAREAN SECTION  1986  . CHOLECYSTECTOMY    . COLONOSCOPY WITH PROPOFOL N/A 12/03/2020    two 4-7 mm polyps in cecum, one small polyp in cecum, one 5 mm polyp, sigmoid diverticulosis, path with tubular adenomas  . ESOPHAGOGASTRODUODENOSCOPY (EGD) WITH PROPOFOL N/A 12/03/2020   normal esophagus, multiple gastric polyps s/p biopsy, small paraesophageal hernia, normal duodenum. Fundic gland polyps.  . ESOPHAGOGASTRODUODENOSCOPY (EGD) WITH PROPOFOL N/A 02/05/2021   Procedure: ESOPHAGOGASTRODUODENOSCOPY (EGD) WITH PROPOFOL;  Surgeon: [name], MD;  Location[facility];  Service: Endoscopy;  Laterality: N/A;  . GIVENS CAPSULE STUDY N/A 12/16/2020   normal small bowel capsule but rapid transit time of 21 minutes. Recommended to resume alendronate.   Marland Kitchen GIVENS CAPSULE STUDY N/A 02/04/2021   Procedure: GIVENS CAPSULE STUDY;  Surgeon: Rogene Houston, MD;  [facility];  Service: Endoscopy;  Laterality:  N/A;  . HOT HEMOSTASIS  02/05/2021   Procedure: HOT HEMOSTASIS (ARGON PLASMA COAGULATION/BICAP);  Surgeon: [name], MD;  [facility];  Service: Endoscopy;;  . TONSILLECTOMY     age 75    Prior to Admission medications   Medication Sig Start Date End Date Taking? Authorizing Provider  acetaminophen (TYLENOL) 500 MG tablet Take 500 mg by mouth every 6 (six) hours as needed  for moderate pain or headache.   Yes [provider]  albuterol (ACCUNEB) 0.63 MG/3ML nebulizer solution Take 1 ampule by nebulization every 6 (six) hours as needed for wheezing.   Yes [provider]  ALPRAZolam Duanne Moron) 0.5 MG tablet Take 0.25 mg by mouth daily as needed for anxiety.   Yes [provider]  ferrous sulfate 325 (65 FE) MG tablet Take 1 tablet (325 mg total) by mouth daily with breakfast. 02/08/21  Yes [name], [name], MD  gabapentin (NEURONTIN) 300 MG capsule Take 1 capsule (300 mg total) by mouth 3 (three) times daily. 02/23/21  Yes Tat, Shanon Brow, MD  montelukast (SINGULAIR) 10 MG tablet Take 10 mg by mouth at bedtime.   Yes [provider]  Multiple Vitamins-Minerals (PRESERVISION AREDS 2) CAPS Take 1 capsule by mouth in the morning and at bedtime.   Yes [provider]  oxyCODONE 10 MG TABS Take 1 tablet (10 mg total) by mouth every 6 (six) hours as needed for moderate pain. 02/23/21  Yes Tat, Shanon Brow, MD  pantoprazole (PROTONIX) 40 MG tablet Take 1 tablet (40 mg total) by mouth 2 (two) times daily. 02/07/21 05/08/21 Yes Shah, Pratik D, DO  sertraline (ZOLOFT) 50 MG tablet Take 25 mg by mouth daily. 01/26/16  Yes [provider]  simvastatin (ZOCOR) 20 MG tablet Take 20 mg by mouth daily.   Yes [provider]  TRELEGY ELLIPTA 200-62.5-25 MCG/INH AEPB Inhale 1 puff into the lungs daily. 12/21/20  Yes [provider]  vitamin B-12 (CYANOCOBALAMIN) 500 MCG tablet Take 1 tablet (500 mcg total) by mouth daily. 02/24/21  Yes Tat, Shanon Brow, MD  Pediatric Multivitamins-Iron [facility] COMPLETE) 18 MG CHEW Chew 1 tablet by mouth 2 (two) times daily. Patient not taking: Reported on 03/02/2021 01/27/21   Rogene Houston, MD  tiZANidine (ZANAFLEX) 4 MG tablet Take 2 mg by mouth at bedtime as needed for muscle spasms. Patient not taking: Reported on 03/02/2021    [provider]    Current Facility-Administered  Medications  Medication Dose Route Frequency Provider Last Rate Last Admin  . 0.9 %  sodium chloride infusion  10 mL/hr Intravenous Once Long, Wonda Olds, MD      . 0.9 %  sodium chloride infusion  10 mL/hr Intravenous Once Long, Wonda Olds, MD      . lactated ringers infusion   Intravenous Continuous Long, Wonda Olds, MD 125 mL/hr at 03/02/21 1119 New Bag at 03/02/21 1119  . pantoprazole (PROTONIX) 80 mg in sodium chloride 0.9 % 100 mL (0.8 mg/mL) infusion  8 mg/hr Intravenous Continuous Long, Wonda Olds, MD 10 mL/hr at 03/02/21 1145 8 mg/hr at 03/02/21 1145  . [START ON 03/05/2021] pantoprazole (PROTONIX) injection 40 mg  40 mg Intravenous Q12H Long, Wonda Olds, MD       Current Outpatient Medications  Medication Sig Dispense Refill  . acetaminophen (TYLENOL) 500 MG tablet Take 500 mg by mouth every 6 (six) hours as needed for moderate pain or headache.    . albuterol (ACCUNEB) 0.63 MG/3ML nebulizer solution Take  1 ampule by nebulization every 6 (six) hours as needed for wheezing.    Marland Kitchen ALPRAZolam (XANAX) 0.5 MG tablet Take 0.25 mg by mouth daily as needed for anxiety.    . ferrous sulfate 325 (65 FE) MG tablet Take 1 tablet (325 mg total) by mouth daily with breakfast. 90 tablet 3  . gabapentin (NEURONTIN) 300 MG capsule Take 1 capsule (300 mg total) by mouth 3 (three) times daily. 90 capsule   . montelukast (SINGULAIR) 10 MG tablet Take 10 mg by mouth at bedtime.    . Multiple Vitamins-Minerals (PRESERVISION AREDS 2) CAPS Take 1 capsule by mouth in the morning and at bedtime.    Marland Kitchen oxyCODONE 10 MG TABS Take 1 tablet (10 mg total) by mouth every 6 (six) hours as needed for moderate pain. 12 tablet 0  . pantoprazole (PROTONIX) 40 MG tablet Take 1 tablet (40 mg total) by mouth 2 (two) times daily. 180 tablet 0  . sertraline (ZOLOFT) 50 MG tablet Take 25 mg by mouth daily.    . simvastatin (ZOCOR) 20 MG tablet Take 20 mg by mouth daily.    Viviana Simpler ELLIPTA 200-62.5-25 MCG/INH AEPB Inhale 1 puff into the  lungs daily.    . vitamin B-12 (CYANOCOBALAMIN) 500 MCG tablet Take 1 tablet (500 mcg total) by mouth daily.    . Pediatric Multivitamins-Iron ([place] COMPLETE) 18 MG CHEW Chew 1 tablet by mouth 2 (two) times daily. (Patient not taking: Reported on 03/02/2021)    . tiZANidine (ZANAFLEX) 4 MG tablet Take 2 mg by mouth at bedtime as needed for muscle spasms. (Patient not taking: Reported on 03/02/2021)      Allergies as of 03/02/2021 - Review Complete 03/02/2021  Allergen Reaction Noted  . Aspirin  02/14/2020  . Codeine Itching 02/14/2020  . Sulfa antibiotics  02/14/2020  . Other Rash 01/14/2014    Family History  Problem Relation Age of Onset  . Osteoporosis Mother   . Other Father        head trauma  . Breast cancer Sister        X's 2 sisters    Social History   Socioeconomic History  . Marital status: Married    Spouse name: Not on file  . Number of children: Not on file  . Years of education: Not on file  . Highest education level: Not on file  Occupational History  . Not on file  Tobacco Use  . Smoking status: Former Smoker    Packs/day: 0.50    Years: 20.00    Pack years: 10.00    Types: Cigarettes    Start date: 09/16/1964    Quit date: 10/05/1983    Years since quitting: 37.4  . Smokeless tobacco: Never Used  Vaping Use  . Vaping Use: Never used  Substance and Sexual Activity  . Alcohol use: Never    Alcohol/week: 0.0 standard drinks  . Drug use: Never  . Sexual activity: Not on file  Other Topics Concern  . Not on file  Social History Narrative  . Not on file   Social Determinants of Health   Financial Resource Strain: Not on file  Food Insecurity: Not on file  Transportation Needs: Not on file  Physical Activity: Not on file  Stress: Not on file  Social Connections: Not on file  Intimate Partner Violence: Not on file    Review of Systems:  As in history of present illness  Physical Exam: Vital signs in last 24 hours: Temp:  [98.4 F (36.9 C)] 98.4 F (36.9 C) (05/30 0843) Pulse Rate:  [98-102] 102 (05/30 1051) Resp:  [16-21] 21 (05/30 1051) BP: (101-108)/(55-61) 103/57 (05/30 1051) SpO2:  [97 %-100 %] 98 % (05/30 1051) Weight:  [62.6 kg] 62.6 kg (05/30 0840)   General:   Patient is seen in ICU bed 3.  She is alert and appears to be in no acute distress.  She is communicative and greeted me by name as I came into the room. Head:  Normocephalic and atraumatic. Eyes:  Sclera clear, no icterus.   Conjunctiva pale Lungs:  Clear throughout to auscultation.   No wheezes, crackles, or rhonchi. No acute distress. Heart:  Regular rate and rhythm; no murmurs, clicks, rubs,  or gallops. Abdomen:  Soft, nontender and nondistended. No masses, hepatosplenomegaly or hernias noted. Normal bowel sounds, without guarding, and without rebound.     Intake/Output from previous day: No intake/output data recorded. Intake/Output this shift: Total I/O In: 100 [IV Piggyback:100] Out: -   Lab Results: Recent Labs    03/02/21 0859  WBC 10.4  HGB 5.0*  HCT 17.4*  PLT 335   BMET Recent Labs    03/02/21 0859  NA 134*  K 4.4  CL 99  CO2 27  GLUCOSE 107*  BUN 24*  CREATININE 0.30*  CALCIUM 8.1*   LFT Recent Labs    03/02/21 0859  PROT 6.6  ALBUMIN 2.8*  AST 32  ALT 24  ALKPHOS 81  BILITOT 0.3   PT/INR Recent Labs    03/02/21 0951  LABPROT 13.5  INR 1.0   Impression: Pleasant 75 year old lady with overt (melena) and occult GI bleeding of obscure etiology although recent studies have regionalized site of blood loss to stomach +/- proximal small bowel\  Persistent AVMs as well as an occult Dieulafoy lesion are the  most likely suspects here.  Her anemia has been recurrent and significant requiring transfusion.  She has  now presented with a hemoglobin of 5.  Transfuse with packed RBCs has been ordered but has been delayed due to antibodies present.  She remains hemodynamically stable.  Given information obtained from the most recent capsule study, it would be worthwhile to go back and reassess her upper GI tract.  She may also benefit from push enteroscopy at the time of EGD if further evaluation of small bowel is needed.  Hopefully, we can get a close look into the base of the enlarging gastric diverticulum as well.  Recommendations:  Agree with plans for transfusion and continuing PPI therapy  I have offered the patient a diagnostic EGD with possible push enteroscopy utilizing the pediatric colonoscope tomorrow.  The risks, benefits, limitations, alternatives and imponderables have been reviewed with the patient.  The potential for therapeutic intervention.  Questions answered.  Patient is agreeable.  Patient understands Dr. Jenetta Downer will be performing the procedure in my absence.         This dictation was prepared with Dragon dictation along with smaller phrase technology. Any transcriptional errors that result from this process are unintentional and may not be corrected upon review.

## 2021-03-03 ENCOUNTER — Inpatient Hospital Stay (HOSPITAL_COMMUNITY): Payer: Medicare Other | Admitting: Anesthesiology

## 2021-03-03 ENCOUNTER — Encounter (HOSPITAL_COMMUNITY): Payer: Self-pay | Admitting: Internal Medicine

## 2021-03-03 ENCOUNTER — Encounter (HOSPITAL_COMMUNITY)
Admission: EM | Disposition: A | Discharge status: Skilled Nursing Facility | Payer: Self-pay | Source: Skilled Nursing Facility | Attending: Family Medicine

## 2021-03-03 DIAGNOSIS — D509 Iron deficiency anemia, unspecified: Secondary | ICD-10-CM

## 2021-03-03 DIAGNOSIS — Z7189 Other specified counseling: Secondary | ICD-10-CM

## 2021-03-03 DIAGNOSIS — K317 Polyp of stomach and duodenum: Secondary | ICD-10-CM

## 2021-03-03 DIAGNOSIS — Z515 Encounter for palliative care: Secondary | ICD-10-CM

## 2021-03-03 DIAGNOSIS — K31819 Angiodysplasia of stomach and duodenum without bleeding: Secondary | ICD-10-CM

## 2021-03-03 HISTORY — PX: ESOPHAGOGASTRODUODENOSCOPY (EGD) WITH PROPOFOL: SHX5813

## 2021-03-03 HISTORY — PX: HOT HEMOSTASIS: SHX5433

## 2021-03-03 LAB — BPAM RBC
Blood Product Expiration Date: 202206152359
Blood Product Expiration Date: 202206292359
Blood Product Expiration Date: 202206292359
ISSUE DATE / TIME: 202205301703
ISSUE DATE / TIME: 202205302027
ISSUE DATE / TIME: 202205302352
Unit Type and Rh: 600
Unit Type and Rh: 600
Unit Type and Rh: 600

## 2021-03-03 LAB — BASIC METABOLIC PANEL
Anion gap: 8 (ref 5–15)
BUN: 14 mg/dL (ref 8–23)
CO2: 27 mmol/L (ref 22–32)
Calcium: 8 mg/dL — ABNORMAL LOW (ref 8.9–10.3)
Chloride: 100 mmol/L (ref 98–111)
Creatinine, Ser: 0.37 mg/dL — ABNORMAL LOW (ref 0.44–1.00)
GFR, Estimated: >60 mL/min (ref >60)
Glucose, Bld: 97 mg/dL (ref 70–99)
Potassium: 3.5 mmol/L (ref 3.5–5.1)
Sodium: 135 mmol/L (ref 135–145)

## 2021-03-03 LAB — TYPE AND SCREEN
ABO/RH(D): A NEG
Antibody Screen: POSITIVE
Donor AG Type: NEGATIVE
Donor AG Type: NEGATIVE
Donor AG Type: NEGATIVE
Unit division: 0
Unit division: 0
Unit division: 0

## 2021-03-03 LAB — CBC
HCT: 32.6 % — ABNORMAL LOW (ref 36.0–46.0)
Hemoglobin: 10.5 g/dL — ABNORMAL LOW (ref 12.0–15.0)
MCH: 30.2 pg (ref 26.0–34.0)
MCHC: 32.2 g/dL (ref 30.0–36.0)
MCV: 93.7 fL (ref 80.0–100.0)
Platelets: 260 10*3/uL (ref 150–400)
RBC: 3.48 MIL/uL — ABNORMAL LOW (ref 3.87–5.11)
RDW: 15.9 % — ABNORMAL HIGH (ref 11.5–15.5)
WBC: 7.8 10*3/uL (ref 4.0–10.5)
nRBC: 0.5 % — ABNORMAL HIGH (ref 0.0–0.2)

## 2021-03-03 SURGERY — ESOPHAGOGASTRODUODENOSCOPY (EGD) WITH PROPOFOL
Anesthesia: General

## 2021-03-03 MED ORDER — LIDOCAINE HCL (CARDIAC) PF 100 MG/5ML IV SOSY
PREFILLED_SYRINGE | INTRAVENOUS | Status: DC | PRN
  Administered 2021-03-03: 40 mg via INTRAVENOUS

## 2021-03-03 MED ORDER — GLUCAGON HCL RDNA (DIAGNOSTIC) 1 MG IJ SOLR
INTRAMUSCULAR | Status: AC
  Filled 2021-03-03: qty 1

## 2021-03-03 MED ORDER — LACTATED RINGERS IV SOLN: INTRAVENOUS | Status: DC

## 2021-03-03 MED ORDER — PROPOFOL 500 MG/50ML IV EMUL
INTRAVENOUS | Status: DC | PRN
  Administered 2021-03-03 (×2): 125 ug/kg/min via INTRAVENOUS

## 2021-03-03 MED ORDER — SODIUM CHLORIDE 0.9 % IV SOLN: INTRAVENOUS | Status: DC

## 2021-03-03 MED ORDER — PROPOFOL 10 MG/ML IV BOLUS
INTRAVENOUS | Status: DC | PRN
  Administered 2021-03-03: 50 mg via INTRAVENOUS

## 2021-03-03 NOTE — Anesthesia Procedure Notes (Signed)
Date/Time: 03/03/2021 11:35 AM Performed by: Karna Dupes, CRNA Oxygen Delivery Method: Nasal cannula

## 2021-03-03 NOTE — Transfer of Care (Signed)
Immediate Anesthesia Transfer of Care Note  Patient: Alexa Nichols  Procedure(s) Performed: ESOPHAGOGASTRODUODENOSCOPY (EGD) WITH PROPOFOL (N/A ) HOT HEMOSTASIS (ARGON PLASMA COAGULATION/BICAP)  Patient Location: PACU  Anesthesia Type:General  Level of Consciousness: drowsy  Airway & Oxygen Therapy: Patient Spontanous Breathing  Post-op Assessment: Report given to RN and Post -op Vital signs reviewed and stable  Post vital signs: Reviewed and stable  Last Vitals:  Vitals Value Taken Time  BP    Temp    Pulse    Resp    SpO2      Last Pain:  Vitals:   03/03/21 1136  TempSrc:   PainSc: 0-No pain         Complications: No complications documented.

## 2021-03-03 NOTE — Progress Notes (Signed)
UNMATCHED BLOOD PRODUCT NOTE  Compare the patient ID on the blood tag to the patient ID on the hospital armband and Blood Bank armband. Then confirm the unit number on the blood tag matches the unit number on the blood product.  If a discrepancy is discovered return the product to blood bank immediately.   Blood Product Type: PRBC Unit #: [id number]  Product Code #: (Found on blood product bag, begins with E)    Start Time: 2038  Starting Rate: 145ml/hr  Rate increase/decreased  (if applicable):      ml/hr  Rate changed time (if applicable):    Stop Time: see blood admin flowsheet    All Other Documentation should be documented within the Blood Admin Flowsheet per policy.  Unit of PRBC verified per Blood Administration Policy

## 2021-03-03 NOTE — Progress Notes (Signed)
We will proceed with push enteroscopy as scheduled.  I thoroughly discussed with the patient his procedure, including the risks involved. Patient understands what the procedure involves including the benefits and any risks. Patient understands alternatives to the proposed procedure. Risks including (but not limited to) bleeding, tearing of the lining (perforation), rupture of adjacent organs, problems with heart and lung function, infection, and medication reactions. A small percentage of complications may require surgery, hospitalization, repeat endoscopic procedure, and/or transfusion.  Patient understood and agreed.  Alexa Bertoni Castaneda, MD Gastroenterology and Hepatology [facility]  

## 2021-03-03 NOTE — Progress Notes (Signed)
UNMATCHED BLOOD PRODUCT NOTE  Compare the patient ID on the blood tag to the patient ID on the hospital armband and Blood Bank armband. Then confirm the unit number on the blood tag matches the unit number on the blood product.  If a discrepancy is discovered return the product to blood bank immediately.   Blood Product Type: PRBC   Unit #: [id number]    Start Time: 0010  Starting Rate: 138ml/hr  Rate increase/decreased  (if applicable):      ml/hr  Rate changed time (if applicable):    Stop Time: 84   All Other Documentation should be documented within the Blood Admin Flowsheet per policy.  Unit of PRBC verified per blood admin policy

## 2021-03-03 NOTE — Anesthesia Postprocedure Evaluation (Signed)
Anesthesia Post Note  Patient: Alexa Nichols  Procedure(s) Performed: ESOPHAGOGASTRODUODENOSCOPY (EGD) WITH PROPOFOL (N/A ) HOT HEMOSTASIS (ARGON PLASMA COAGULATION/BICAP)  Patient location during evaluation: Phase II Anesthesia Type: General Level of consciousness: awake Pain management: pain level controlled Vital Signs Assessment: post-procedure vital signs reviewed and stable Respiratory status: spontaneous breathing and respiratory function stable Cardiovascular status: blood pressure returned to baseline and stable Postop Assessment: no headache and no apparent nausea or vomiting Anesthetic complications: no Comments: Late entry   No complications documented.   Last Vitals:  Vitals:   03/03/21 1222 03/03/21 1300  BP: (!) 113/52 (!) 141/59  Pulse: 84 80  Resp:  15  Temp:    SpO2: 97% 96%    Last Pain:  Vitals:   03/03/21 1233  TempSrc:   PainSc: 0-No pain                 Louann Sjogren

## 2021-03-03 NOTE — Anesthesia Preprocedure Evaluation (Signed)
Anesthesia Evaluation  Patient identified by MRN, date of birth, ID band Patient awake    Reviewed: Allergy & Precautions, H&P , NPO status , Patient's Chart, lab work & pertinent test results, reviewed documented beta blocker date and time   History of Anesthesia Complications Negative for: history of anesthetic complications  Airway Mallampati: II  TM Distance: >3 FB Neck ROM: Full    Dental no notable dental hx. (+) Dental Advisory Given Crowns :   Pulmonary neg pulmonary ROS, shortness of breath and with exertion, asthma , COPD,  COPD inhaler, former smoker,    Pulmonary exam normal breath sounds clear to auscultation       Cardiovascular Exercise Tolerance: Good negative cardio ROS  + Valvular Problems/Murmurs MR  Rhythm:Regular Rate:Normal + Systolic murmurs [id number] 13:18:16 [facility] ROUTINE RECORD Normal sinus rhythm Low voltage QRS Cannot rule out Anteroseptal infarct , age undetermined ST & T wave abnormality, consider lateral ischemia Abnormal ECG Confirmed by Asencion Noble [phone]) on 12/02/2020 5:57:21 PM   Neuro/Psych PSYCHIATRIC DISORDERS Depression  Neuromuscular disease negative psych ROS   GI/Hepatic negative GI ROS, Neg liver ROS, GERD  Medicated and Controlled,  Endo/Other  negative endocrine ROS  Renal/GU negative Renal ROS  negative genitourinary   Musculoskeletal  (+) Arthritis  (chronic low back pain),   Abdominal   Peds  Hematology negative hematology ROS (+) Blood dyscrasia, anemia ,   Anesthesia Other Findings Left breast cancer Degenerative retinal drusen of right eye Early stage nonexudative age-related macular degeneration of right eye Exudative age-related macular degeneration of right eye with active choroidal neovascularization (HCC) History of vitrectomy.    Reproductive/Obstetrics negative OB ROS                              Anesthesia Physical  Anesthesia Plan  ASA: III  Anesthesia Plan: General   Post-op Pain Management:    Induction: Intravenous  PONV Risk Score and Plan: Propofol infusion  Airway Management Planned: Nasal Cannula and Natural Airway  Additional Equipment:   Intra-op Plan:   Post-operative Plan:   Informed Consent: I have reviewed the patients History and Physical, chart, labs and discussed the procedure including the risks, benefits and alternatives for the proposed anesthesia with the patient or authorized representative who has indicated his/her understanding and acceptance.     Dental advisory given  Plan Discussed with: CRNA and Surgeon  Anesthesia Plan Comments:         Anesthesia Quick Evaluation

## 2021-03-03 NOTE — Brief Op Note (Signed)
03/02/2021 - 03/03/2021  12:19 PM  PATIENT:  Alexa Nichols  75 y.o. female  PRE-OPERATIVE DIAGNOSIS:  UGI bleed  POST-OPERATIVE DIAGNOSIS:  gastric polyps; gastric AVM's x 2 (with APC);   PROCEDURE:  Procedure(s) with comments: ESOPHAGOGASTRODUODENOSCOPY (EGD) WITH PROPOFOL (N/A) - with possible push enteroscopy utilizing the pediatric colonoscope HOT HEMOSTASIS (ARGON PLASMA COAGULATION/BICAP)  SURGEON:  Surgeon(s) and Role:    * [name], MD - Primary  Patient underwent push enteroscopy today under propofol sedation.  She tolerated the procedure adequately.  Had presence of 2 AVMs in the gastric body with largest size of 8 mm.  The lesions were ablated with APC successfully.  There were multiple polyps in her gastric body.  These were not intervened as the appear to be fundic gland polyps.  No lesions were seen up on thorough inspection of the duodenum and the proximal jejunum.  RECOMMENDATIONS: - Return patient to hospital ward for ongoing care.  - Resume previous diet.  - Check H/H daily - Continue oral ferrous sulfate 325 mg BID. - If patient has decline in hemoglobin will need to consider a repeat capsule endoscopy.  [name], MD Gastroenterology and Hepatology [facility]

## 2021-03-03 NOTE — Consult Note (Signed)
Consultation Note Date: 03/03/2021   Patient Name: Alexa Nichols  DOB: 02/26/1946  MRN: [id number]  Age / Sex: 75 y.o., female  PCP: [name], MD Referring Physician: Barton Dubois, MD  Reason for Consultation: Establishing goals of care and Psychosocial/spiritual support  HPI/Patient Profile: 75 y.o. female  with past medical history of asthma/COPD, depression/anxiety, history of [name] syndrome, gastroesophageal flux disease, history of GI bleed secondary to gastric/duodenal AVMs and iron deficiency anemia admitted on 03/02/2021 with symptomatic anemia, melanotic appearance to stools positive fecal occult blood, COPD.   Clinical Assessment and Goals of Care: I have reviewed medical records including EPIC notes, labs and imaging, received report from RN, assessed the patient and then met at the bedside with Alexa Nichols to discuss diagnosis prognosis, GOC, disposition and options.  Alexa Nichols has just come from EGD.  She greets me, making and somewhat keeping eye contact.  She is alert and oriented to person, place, situation.  She is able to make her basic needs known.  There is no family at bedside at this time.  I introduced Palliative Medicine as specialized medical care for people living with serious illness. It focuses on providing relief from the symptoms and stress of a serious illness. The goal is to improve quality of life for both the patient and the family.  Somewhat limited interview today as she has just returned from EGD.  We discussed the results, to AVM were found in gastric body and cauterized.  She shares her relief.  I share that her hemoglobin remains stable.  We discussed a brief life review of the patient and then focused on their current illness. The natural disease trajectory and expectations at EOL were discussed.  No discussions related to advanced directives today as she  has had a EGD with propofol earlier in the day.  Discussed the importance of continued conversation with family and the medical providers regarding overall plan of care and treatment options, ensuring decisions are within the context of the patient's values and GOCs.    Alexa Nichols denies questions or concerns at this time.  She was encouraged to call with questions or concerns.  PMT will continue to support holistically.  Conference with attending, bedside nursing staff, transition of care team related to patient condition, needs, goals of care, disposition.   HCPOA    NEXT OF KIN -Alexa Nichols names her spouse, Magda Paganini, as her Ambulance person.    SUMMARY OF RECOMMENDATIONS   At this point continue to treat the treatable It remains to be seen whether she would benefit from short-term rehab. Anticipate home with home health  Will discuss outpatient palliative services  Code Status/Advance Care Planning:  Full code -due to EGD with propofol earlier in the day, CODE STATUS not discussed  Symptom Management:   Per hospitalist, no additional needs at this time.  Palliative Prophylaxis:   Oral Care and Turn Reposition  Additional Recommendations (Limitations, Scope, Preferences):  Full Scope Treatment  Psycho-social/Spiritual:   Desire for further Chaplaincy support:yes  Additional Recommendations: Caregiving  Support/Resources  Prognosis:   Unable to determine, based on outcomes.  6 months or more would not be surprising based on current functional status, chronic illness burden.  Discharge Planning: To be determined, anticipate home with home health at the most      Primary Diagnoses: Present on Admission: . Symptomatic anemia . Depression . COPD (chronic obstructive pulmonary disease) ([place])   I have reviewed the medical record, interviewed the patient and family, and examined the patient. The following aspects are pertinent.  Past Medical History:   Diagnosis Date  . Asthma   . Chronic low back pain   . COPD (chronic obstructive pulmonary disease) ([place])   . Depression   . GERD (gastroesophageal reflux disease)   . Guillain Barr syndrome ([place])   . Heart murmur   . Lobular carcinoma of left breast ([place]) 1997   in situ  . Other and unspecified hyperlipidemia   . Seasonal allergies    Social History   Socioeconomic History  . Marital status: Married    Spouse name: Not on file  . Number of children: Not on file  . Years of education: Not on file  . Highest education level: Not on file  Occupational History  . Not on file  Tobacco Use  . Smoking status: Former Smoker    Packs/day: 0.50    Years: 20.00    Pack years: 10.00    Types: Cigarettes    Start date: 09/16/1964    Quit date: 10/05/1983    Years since quitting: 37.4  . Smokeless tobacco: Never Used  Vaping Use  . Vaping Use: Never used  Substance and Sexual Activity  . Alcohol use: Never    Alcohol/week: 0.0 standard drinks  . Drug use: Never  . Sexual activity: Not on file  Other Topics Concern  . Not on file  Social History Narrative  . Not on file   Social Determinants of Health   Financial Resource Strain: Not on file  Food Insecurity: Not on file  Transportation Needs: Not on file  Physical Activity: Not on file  Stress: Not on file  Social Connections: Not on file   Family History  Problem Relation Age of Onset  . Osteoporosis Mother   . Other Father        head trauma  . Breast cancer Sister        X's 2 sisters   Scheduled Meds: . [MAR Hold] Chlorhexidine Gluconate Cloth  6 each Topical Q0600  . [MAR Hold] umeclidinium bromide  1 puff Inhalation Daily   And  . [MAR Hold] fluticasone furoate-vilanterol  1 puff Inhalation Daily  . [MAR Hold] gabapentin  300 mg Oral Q8H  . [MAR Hold] montelukast  10 mg Oral QHS  . [MAR Hold] multivitamin-lutein  1 capsule Oral BID  . [MAR Hold] pantoprazole  40 mg Intravenous Q12H  . [MAR Hold]  sertraline  25 mg Oral Daily   Continuous Infusions: . [MAR Hold] sodium chloride Stopped (03/02/21 1301)  . [MAR Hold] sodium chloride Stopped (03/02/21 1301)  . sodium chloride 100 mL/hr at 03/03/21 0752  . sodium chloride    . sodium chloride    . lactated ringers    . pantoprozole (PROTONIX) infusion 8 mg/hr (03/03/21 1004)   PRN Meds:.[MAR Hold] acetaminophen, [MAR Hold] albuterol, [MAR Hold] ALPRAZolam, [MAR Hold] ondansetron **OR** [MAR Hold] ondansetron (ZOFRAN) IV, [MAR Hold] oxyCODONE Medications Prior to Admission:  Prior to Admission medications   Medication  Sig Start Date End Date Taking? Authorizing Provider  acetaminophen (TYLENOL) 500 MG tablet Take 500 mg by mouth every 6 (six) hours as needed for moderate pain or headache.   Yes [provider]  albuterol (ACCUNEB) 0.63 MG/3ML nebulizer solution Take 1 ampule by nebulization every 6 (six) hours as needed for wheezing.   Yes [provider]  ALPRAZolam Duanne Moron) 0.5 MG tablet Take 0.25 mg by mouth daily as needed for anxiety.   Yes [provider]  ferrous sulfate 325 (65 FE) MG tablet Take 1 tablet (325 mg total) by mouth daily with breakfast. 02/08/21  Yes [name], [name], MD  gabapentin (NEURONTIN) 300 MG capsule Take 1 capsule (300 mg total) by mouth 3 (three) times daily. 02/23/21  Yes Tat, Shanon Brow, MD  montelukast (SINGULAIR) 10 MG tablet Take 10 mg by mouth at bedtime.   Yes [provider]  Multiple Vitamins-Minerals (PRESERVISION AREDS 2) CAPS Take 1 capsule by mouth in the morning and at bedtime.   Yes [provider]  oxyCODONE 10 MG TABS Take 1 tablet (10 mg total) by mouth every 6 (six) hours as needed for moderate pain. 02/23/21  Yes Tat, Shanon Brow, MD  pantoprazole (PROTONIX) 40 MG tablet Take 1 tablet (40 mg total) by mouth 2 (two) times daily. 02/07/21 05/08/21 Yes Shah, Pratik D, DO  sertraline (ZOLOFT) 50 MG tablet Take 25 mg by mouth daily. 01/26/16  Yes [provider]  simvastatin (ZOCOR) 20 MG tablet Take 20 mg by mouth daily.   Yes [provider]  TRELEGY ELLIPTA 200-62.5-25 MCG/INH AEPB Inhale 1 puff into the lungs daily. 12/21/20  Yes [provider]  vitamin B-12 (CYANOCOBALAMIN) 500 MCG tablet Take 1 tablet (500 mcg total) by mouth daily. 02/24/21  Yes Tat, Shanon Brow, MD  tiZANidine (ZANAFLEX) 4 MG tablet Take 2 mg by mouth at bedtime as needed for muscle spasms. Patient not taking: Reported on 03/02/2021    [provider]   Allergies  Allergen Reactions  . Aspirin     samter's syndrome  . Codeine Itching  . Sulfa Antibiotics     Unknown reaction  . Other Rash    gel filled fentanyl patch, adhesive on that patch caused ithcing   Review of Systems  Unable to perform ROS: Other  All other systems reviewed and are negative.   Physical Exam Vitals and nursing note reviewed.  Constitutional:      General: She is not in acute distress.    Appearance: She is not ill-appearing.  HENT:     Head: Normocephalic and atraumatic.  Cardiovascular:     Rate and Rhythm: Normal rate.  Pulmonary:     Effort: Pulmonary effort is normal. No respiratory distress.  Abdominal:     Palpations: Abdomen is soft.     Tenderness: There is no abdominal tenderness. There is no guarding.  Skin:    General: Skin is warm and dry.     Coloration: Skin is pale.  Neurological:     Mental Status: She is alert and oriented to person, place, and time.  Psychiatric:        Mood and Affect: Mood normal.        Behavior: Behavior normal.     Vital Signs: BP 131/64   Pulse 82   Temp 98.4 F (36.9 C) (Oral)   Resp 17   Ht _0  (1.473 m)   Wt 62.9 kg   SpO2 97%   BMI 28.98 kg/m  Pain Scale: 0-10 POSS *See Group Information*: 1-Acceptable,Awake and alert Pain Score: 0-No pain   SpO2: SpO2: 97 % O2 Device:SpO2: 97 % O2 Flow Rate: .O2 Flow Rate (L/min): 2 L/min  IO: Intake/output summary:   Intake/Output Summary  (Last 24 hours) at 03/03/2021 1140 Last data filed at 03/03/2021 0802 Gross per 24 hour  Intake 2952.26 ml  Output 2000 ml  Net 952.26 ml    LBM:   Baseline Weight: Weight: 62.6 kg Most recent weight: Weight: 62.9 kg     Palliative Assessment/Data:   Flowsheet Rows   Flowsheet Row Most Recent Value  Intake Tab   Referral Department Hospitalist  Unit at Time of Referral Intermediate Care Unit  Palliative Care Primary Diagnosis Other (Comment)  Date Notified 03/02/21  Palliative Care Type New Palliative care  Reason for referral Clarify Goals of Care  Date of Admission 03/02/21  Date first seen by Palliative Care 03/03/21  # of days Palliative referral response time 1 Day(s)  # of days IP prior to Palliative referral 0  Clinical Assessment   Palliative Performance Scale Score 40%  Pain Max last 24 hours Not able to report  Pain Min Last 24 hours Not able to report  Dyspnea Max Last 24 Hours Not able to report  Dyspnea Min Last 24 hours Not able to report  Psychosocial & Spiritual Assessment   Palliative Care Outcomes       Time In: 1400 Time Out: 1430 Time Total: 30 minutes Greater than 50%  of this time was spent counseling and coordinating care related to the above assessment and plan.  Signed by: Drue Novel, NP   Please contact Palliative Medicine Team phone at [phone] for questions and concerns.  For individual provider: See Shea Evans

## 2021-03-03 NOTE — Progress Notes (Signed)
PROGRESS NOTE    Alexa Nichols  XLK:[id number] DOB: 1946/07/28 DOA: 03/02/2021 PCP: [name], MD   Chief Complaint  Patient presents with  . Abnormal Lab    Brief admission narrative:  Alexa Nichols is a 75 y.o. female with medical history significant of asthma/COPD, depression/anxiety, history of Gilliam Barr syndrome, gastroesophageal flux disease, history of GI bleed secondary to gastric/duodenal AVMs and iron deficiency anemia; who presented to the hospital secondary to increased weakness.  Patient with recent admission secondary to Alexa Hal syndrome discharge to a skilled nursing facility for further care and rehabilitation.  During blood work routinely done at her skilled nursing facility patient was found to have a significantly low hemoglobin of 4.9.  Patient expressed increased fatigue, shortness of breath with activity and generalized weakness.  No chest pain, no nausea, no vomiting, no hematemesis, no abdominal pain, no focal weakness or sick contacts.  In the ED COVID PCR negative  ED Course: CBC demonstrating hemoglobin of 5.0; patient with soft blood pressure and appreciated tachycardia.  Otherwise hemodynamically stable and in no acute distress.  Patient was typed and crossed with check of antibodies but demonstrated to be positive and needing her blood to be transferred from [facility].  GI service was consulted, IV fluids and 2 large IV bore were requested. TRH contacted to place in the hospital for further evaluation and management.  Assessment & Plan: 1-Symptomatic anemia: In the setting of acute on chronic blood loss anemia -Planning for endoscopy evaluation later today -Will continue IV PPI -Continue n.p.o. status for now -Hemoglobin up to 10.5 after transfusion -Will continue to follow hemoglobin trend -Currently without overt bleeding reported.  2-Gastrointestinal hemorrhage with melena -As mentioned above endoscopic evaluation to be  performed by GI service later today -Patient with a prior history of AVMs.  3-COPD (chronic obstructive pulmonary disease) ([facility]) -No complaints of shortness of breath and currently no wheezing. -Continue home bronchodilator regimen.  4-Depression/anxiety -No suicidal ideation hallucination -Continue Zoloft and as needed use of alprazolam.  5-history of chronic pain syndrome -Continue as needed Tylenol, Neurontin and oxycodone. -Patient expressed pain to be well controlled currently.  6-history of Alexa Hal -Continue Neurontin -Continue patient follow-up with neurology service   DVT prophylaxis: SCDs. Code Status: Full code. Family Communication: No family at bedside.  Patient expressed that she will update her husband with details about plan of care. Disposition:   Status is: Inpatient  Dispo: The patient is from: Skilled nursing facility              Anticipated d/c is to: Skilled nursing facility              Patient currently no medically stable for discharge; but medically stable to transfer to [facility] bed.  Patient with good response to blood transfusion and stabilization in her hemoglobin level.  Plan is for endoscopy evaluation later today and will follow further recommendation by GI.  Continue n.p.o. status for now.   Difficult to place patient no     Consultants:   GI service   Procedures:  EGD planned for later today 03/03/2021   Antimicrobials:  None   Subjective: Hemodynamically stable; no chest pain, no palpitation, no nausea, no vomiting, no shortness of breath.  Patient is afebrile.  Ready to proceed with endoscopic evaluation.  Objective: Vitals:   03/03/21 1109 03/03/21 1215 03/03/21 1222 03/03/21 1300  BP: 131/64 (!) 101/57 (!) 113/52 (!) 141/59  Pulse: 82 85 84 80  Resp:  17 17  15   Temp: 98.4 F (36.9 C) 98 F (36.7 C)    TempSrc: Oral     SpO2: 97% 97% 97% 96%  Weight:      Height:        Intake/Output Summary (Last 24 hours) at  03/03/2021 1426 Last data filed at 03/03/2021 1212 Gross per 24 hour  Intake 3302.26 ml  Output 2000 ml  Net 1302.26 ml   Filed Weights   03/02/21 0840 03/02/21 1300  Weight: 62.6 kg 62.9 kg    Examination:  General exam: Appears calm and comfortable; no chest pain, no nausea, no vomiting, reports feeling much better today and is slightly more energetic.  No frank overt bleeding described by nursing staff. Respiratory system: Clear to auscultation bilaterally; no using accessory muscle.  Good saturation on room air. Cardiovascular system: S1 and S2, no rubs, no gallops, no JVD. Gastrointestinal system: Abdomen is nondistended, soft and nontender. No organomegaly or masses felt. Normal bowel sounds heard. Central nervous system: Alert and oriented. No focal neurological deficits. Extremities: No cyanosis or clubbing. Skin: No rashes, no petechiae. Psychiatry: Mood & affect appropriate.     Data Reviewed: I have personally reviewed following labs and imaging studies  CBC: Recent Labs  Lab 03/02/21 0859 03/03/21 0411  WBC 10.4 7.8  NEUTROABS 7.4  --   HGB 5.0* 10.5*  HCT 17.4* 32.6*  MCV 97.8 93.7  PLT 335 295    Basic Metabolic Panel: Recent Labs  Lab 03/02/21 0859 03/03/21 0411  NA 134* 135  K 4.4 3.5  CL 99 100  CO2 27 27  GLUCOSE 107* 97  BUN 24* 14  CREATININE 0.30* 0.37*  CALCIUM 8.1* 8.0*    GFR: Estimated Creatinine Clearance: 49.9 mL/min (A) (by C-G formula based on SCr of 0.37 mg/dL (L)).  Liver Function Tests: Recent Labs  Lab 03/02/21 0859  AST 32  ALT 24  ALKPHOS 81  BILITOT 0.3  PROT 6.6  ALBUMIN 2.8*    CBG: No results for input(s): GLUCAP in the last 168 hours.   Recent Results (from the past 240 hour(s))  Resp Panel by RT-PCR (Flu A&B, Covid) Nasopharyngeal Swab     Status: None   Collection Time: 02/23/21 10:23 AM   Specimen: Nasopharyngeal Swab; Nasopharyngeal(NP) swabs in vial transport medium  Result Value Ref Range Status    SARS Coronavirus 2 by RT PCR NEGATIVE NEGATIVE Final    Comment: (NOTE) SARS-CoV-2 target nucleic acids are NOT DETECTED.  The SARS-CoV-2 RNA is generally detectable in upper respiratory specimens during the acute phase of infection. The lowest concentration of SARS-CoV-2 viral copies this assay can detect is 138 copies/mL. A negative result does not preclude SARS-Cov-2 infection and should not be used as the sole basis for treatment or other patient management decisions. A negative result may occur with  improper specimen collection/handling, submission of specimen other than nasopharyngeal swab, presence of viral mutation(s) within the areas targeted by this assay, and inadequate number of viral copies(<138 copies/mL). A negative result must be combined with clinical observations, patient history, and epidemiological information. The expected result is Negative.  Fact Sheet for Patients:  EntrepreneurPulse.com.au  Fact Sheet for Healthcare Providers:  IncredibleEmployment.be  This test is no t yet approved or cleared by the [place] FDA and  has been authorized for detection and/or diagnosis of SARS-CoV-2 by FDA under an Emergency Use Authorization (EUA). This EUA will remain  in effect (meaning this test can be used) for  the duration of the COVID-19 declaration under Section 564(b)(1) of the Act, 21 U.S.C.section 360bbb-3(b)(1), unless the authorization is terminated  or revoked sooner.       Influenza A by PCR NEGATIVE NEGATIVE Final   Influenza B by PCR NEGATIVE NEGATIVE Final    Comment: (NOTE) The Xpert Xpress SARS-CoV-2/FLU/RSV plus assay is intended as an aid in the diagnosis of influenza from Nasopharyngeal swab specimens and should not be used as a sole basis for treatment. Nasal washings and aspirates are unacceptable for Xpert Xpress SARS-CoV-2/FLU/RSV testing.  Fact Sheet for  Patients: EntrepreneurPulse.com.au  Fact Sheet for Healthcare Providers: IncredibleEmployment.be  This test is not yet approved or cleared by the [place] FDA and has been authorized for detection and/or diagnosis of SARS-CoV-2 by FDA under an Emergency Use Authorization (EUA). This EUA will remain in effect (meaning this test can be used) for the duration of the COVID-19 declaration under Section 564(b)(1) of the Act, 21 U.S.C. section 360bbb-3(b)(1), unless the authorization is terminated or revoked.  Performed at [facility], [address], [place], [place] [address]   Resp Panel by RT-PCR (Flu A&B, Covid) Nasopharyngeal Swab     Status: None   Collection Time: 03/02/21  9:15 AM   Specimen: Nasopharyngeal Swab; Nasopharyngeal(NP) swabs in vial transport medium  Result Value Ref Range Status   SARS Coronavirus 2 by RT PCR NEGATIVE NEGATIVE Final    Comment: (NOTE) SARS-CoV-2 target nucleic acids are NOT DETECTED.  The SARS-CoV-2 RNA is generally detectable in upper respiratory specimens during the acute phase of infection. The lowest concentration of SARS-CoV-2 viral copies this assay can detect is 138 copies/mL. A negative result does not preclude SARS-Cov-2 infection and should not be used as the sole basis for treatment or other patient management decisions. A negative result may occur with  improper specimen collection/handling, submission of specimen other than nasopharyngeal swab, presence of viral mutation(s) within the areas targeted by this assay, and inadequate number of viral copies(<138 copies/mL). A negative result must be combined with clinical observations, patient history, and epidemiological information. The expected result is Negative.  Fact Sheet for Patients:  EntrepreneurPulse.com.au  Fact Sheet for Healthcare Providers:  IncredibleEmployment.be  This test is no t yet approved  or cleared by the [place] FDA and  has been authorized for detection and/or diagnosis of SARS-CoV-2 by FDA under an Emergency Use Authorization (EUA). This EUA will remain  in effect (meaning this test can be used) for the duration of the COVID-19 declaration under Section 564(b)(1) of the Act, 21 U.S.C.section 360bbb-3(b)(1), unless the authorization is terminated  or revoked sooner.       Influenza A by PCR NEGATIVE NEGATIVE Final   Influenza B by PCR NEGATIVE NEGATIVE Final    Comment: (NOTE) The Xpert Xpress SARS-CoV-2/FLU/RSV plus assay is intended as an aid in the diagnosis of influenza from Nasopharyngeal swab specimens and should not be used as a sole basis for treatment. Nasal washings and aspirates are unacceptable for Xpert Xpress SARS-CoV-2/FLU/RSV testing.  Fact Sheet for Patients: EntrepreneurPulse.com.au  Fact Sheet for Healthcare Providers: IncredibleEmployment.be  This test is not yet approved or cleared by the [place] FDA and has been authorized for detection and/or diagnosis of SARS-CoV-2 by FDA under an Emergency Use Authorization (EUA). This EUA will remain in effect (meaning this test can be used) for the duration of the COVID-19 declaration under Section 564(b)(1) of the Act, 21 U.S.C. section 360bbb-3(b)(1), unless the authorization is terminated or revoked.  Performed at [facility], [address] [place], [place] [address]   MRSA PCR Screening     Status: None   Collection Time: 03/02/21 12:21 PM   Specimen: Nasopharyngeal  Result Value Ref Range Status   MRSA by PCR NEGATIVE NEGATIVE Final    Comment:        The GeneXpert MRSA Assay (FDA approved for NASAL specimens only), is one component of a comprehensive MRSA colonization surveillance program. It is not intended to diagnose MRSA infection nor to guide or monitor treatment for MRSA infections. Performed at [facility], [address], [place], [place] [address]      Radiology Studies: No results found.   Scheduled Meds: . Chlorhexidine Gluconate Cloth  6 each Topical Q0600  . umeclidinium bromide  1 puff Inhalation Daily   And  . fluticasone furoate-vilanterol  1 puff Inhalation Daily  . gabapentin  300 mg Oral Q8H  . montelukast  10 mg Oral QHS  . multivitamin-lutein  1 capsule Oral BID  . [START ON 03/05/2021] pantoprazole  40 mg Intravenous Q12H  . sertraline  25 mg Oral Daily   Continuous Infusions: . sodium chloride Stopped (03/02/21 1301)  . sodium chloride Stopped (03/02/21 1301)  . pantoprozole (PROTONIX) infusion 8 mg/hr (03/03/21 1004)     LOS: 1 day    Time spent: 30 minutes.    Barton Dubois, MD [facility]   To contact the attending provider between 7A-7P or the covering provider during after hours 7P-7A, please log into the web site [email or web address] and access using universal [place] password for that web site. If you do not have the password, please call the hospital operator.  03/03/2021, 2:26 PM

## 2021-03-04 ENCOUNTER — Encounter (HOSPITAL_COMMUNITY): Payer: Self-pay | Admitting: Gastroenterology

## 2021-03-04 DIAGNOSIS — K5900 Constipation, unspecified: Secondary | ICD-10-CM

## 2021-03-04 LAB — CBC
HCT: 31.4 % — ABNORMAL LOW (ref 36.0–46.0)
Hemoglobin: 9.8 g/dL — ABNORMAL LOW (ref 12.0–15.0)
MCH: 29.6 pg (ref 26.0–34.0)
MCHC: 31.2 g/dL (ref 30.0–36.0)
MCV: 94.9 fL (ref 80.0–100.0)
Platelets: 279 10*3/uL (ref 150–400)
RBC: 3.31 MIL/uL — ABNORMAL LOW (ref 3.87–5.11)
RDW: 16 % — ABNORMAL HIGH (ref 11.5–15.5)
WBC: 9.2 10*3/uL (ref 4.0–10.5)
nRBC: 0 % (ref 0.0–0.2)

## 2021-03-04 MED ORDER — POLYETHYLENE GLYCOL 3350 17 G PO PACK
17.0000 g | PACK | Freq: Every day | ORAL | Status: DC
  Administered 2021-03-04 – 2021-03-05 (×2): 17 g via ORAL
  Filled 2021-03-04 (×2): qty 1

## 2021-03-04 NOTE — Care Management Important Message (Signed)
Important Message  Patient Details  Name: Alexa Nichols MRN: [id number] Date of Birth: 1946-03-25   Medicare Important Message Given:  Yes (late entry)     Tommy Medal 03/04/2021, 12:42 PM

## 2021-03-04 NOTE — Progress Notes (Addendum)
Subjective: No BM since Saturday or Sunday. This has been an intermittent problem. Uses suppositories as needed. Has used MiraLAX in the past which has been helpful. Legs are completely paralyzed. Also with weakness in her arms and hands.   No abdominal pain, nausea, vomiting, GERD symptoms. No known melena or bright red blood per rectum. Tolerating her diet well.   Objective: Vital signs in last 24 hours: Temp:  [98 F (36.7 C)-98.7 F (37.1 C)] 98.4 F (36.9 C) (06/01 0415) Pulse Rate:  [78-85] 79 (06/01 0415) Resp:  [10-20] 18 (06/01 0415) BP: (101-141)/(47-67) 128/56 (06/01 0415) SpO2:  [92 %-100 %] 92 % (06/01 0733)   General:   Alert and oriented, pleasant Head:  Normocephalic and atraumatic. Eyes:  No icterus, sclera clear. Conjuctiva pink.  Abdomen:  Bowel sounds present. Abdomen is full but soft and non-tender to palpation. No HSM or hernias noted. No rebound or guarding. No masses appreciated  Extremities:  Without edema. Neurologic:  Alert and  oriented x4;  grossly normal neurologically. Psych:  Normal mood and affect.  Intake/Output from previous day: 05/31 0701 - 06/01 0700 In: 1949.4 [I.V.:1949.4] Out: 800 [Urine:800] Intake/Output this shift: No intake/output data recorded.  Lab Results: Recent Labs    03/02/21 0859 03/03/21 0411 03/04/21 0509  WBC 10.4 7.8 9.2  HGB 5.0* 10.5* 9.8*  HCT 17.4* 32.6* 31.4*  PLT 335 260 279   BMET Recent Labs    03/02/21 0859 03/03/21 0411  NA 134* 135  K 4.4 3.5  CL 99 100  CO2 27 27  GLUCOSE 107* 97  BUN 24* 14  CREATININE 0.30* 0.37*  CALCIUM 8.1* 8.0*   LFT Recent Labs    03/02/21 0859  PROT 6.6  ALBUMIN 2.8*  AST 32  ALT 24  ALKPHOS 81  BILITOT 0.3   PT/INR Recent Labs    03/02/21 0951  LABPROT 13.5  INR 1.0    Assessment: 75 year old lady nursing home resident with a history of newly diagnosed Guillain-Barr and recurrent GI bleeding with overt melena as well as history of occult GI  bleeding requiring multiple units of blood during prior hospitalizations brought to the emergency department 5/30 after being found to have an extremely low hemoglobin of 5 on blood work, heme positive without overt GI bleeding, associated fatigue over the last couple of weeks.  She is hemodynamically stable in the emergency room.  Acute on chronic anemia in the setting of chronic GI blood loss: Prior GI evaluation with EGD and colonoscopy in March 2022 with multiple gastric polyps biopsied, small paraesophageal hernia, otherwise normal EGD.  Colonoscopy with two 4-7 mm polyps in the cecum resected and retrieved, one small polyp in the cecum biopsied, one 5 mm polyp in the hepatic flexure resected and retrieved, diverticulosis, external hemorrhoids.  Capsule study also completed in March revealed no significant abnormalities though rapid small bowel transit time.  CT angio A/P 5/4 with no evidence of active bleeding, posterior gastric diverticulum without inflammatory/edematous changes. Repeat capsule study 02/05/2021 revealing some coffee-ground material in the stomach, lesion with bluish tip, couple gastric erosions, single erosion versus small AVM noted in the proximal small bowel.  Blood clot noted more distally in the small bowel.  Follow-up EGD 5/5 with nonobstructing Schatzki's ring, gastric diverticulum, small hiatal hernia, gastric polyps, gastric and duodenal AVMs s/p APC.  EGD this admission with normal esophagus, 2 AVMs s/p APC therapy, multiple gastric polyps, otherwise normal exam.  Recommended continuing oral iron twice daily.  If declining hemoglobin, would need to consider repeat capsule endoscopy.   There is some discrepancy in the chart as to how much blood was transfused, but per nursing, patient did receive 3 units PRBCs. Hemoglobin improved to 10.5 on 5/31. Hemoglobin down to 9.8 today.  This may be secondary to equilibration.  No overt GI bleeding. She will be completing Protonix infusion  tomorrow and will transition to PPI twice daily.  Will continue to monitor over the next 24 hours. If declining hemoglobin, consider repeat Capsule study. May also consider subQ octreotide if ongoing occult GI bleed as she may have AMVs in her small bowel that we have not been able to identify.   Constipation: This has been a chronic intermittent problem. Seems worse since diagnosis of Guillain-Barr. Likely secondary to inactivity as well as recently starting oral iron. Will start MiraLAX daily.   Plan: 1.  Continue to monitor H/H and for overt GI bleeding for another 24 hours. 2.  If declining hemoglobin, consider repeat capsule endoscopy. 3.  Could also consider subQ octreotide if she continues to have occult bleeding without identified source. 4.  Complete PPI infusion tomorrow and transition to PPI twice daily. 5.  Start MiraLAX 17 g daily. 6.  Continue regular, heart healthy diet. 7.  Hold oral iron while inpatient.  May resume oral iron twice daily as outpatient. 8.  Avoid all NSAIDs.  9.  We will continue to follow with you.    LOS: 2 days    03/04/2021, 7:36 AM   Alexa Altes, PA-C [facility]

## 2021-03-04 NOTE — Progress Notes (Signed)
PROGRESS NOTE    Alexa Nichols  WPY:[id number] DOB: 1945-11-27 DOA: 03/02/2021 PCP: [name], MD   Chief Complaint  Patient presents with  . Abnormal Lab    Brief admission narrative:  Alexa Nichols is a 75 y.o. female with medical history significant of asthma/COPD, depression/anxiety, history of Gilliam Barr syndrome, gastroesophageal flux disease, history of GI bleed secondary to gastric/duodenal AVMs and iron deficiency anemia; who presented to the hospital secondary to increased weakness.  Patient with recent admission secondary to [name] syndrome discharge to a skilled nursing facility for further care and rehabilitation.  During blood work routinely done at her skilled nursing facility patient was found to have a significantly low hemoglobin of 4.9.  Patient expressed increased fatigue, shortness of breath with activity and generalized weakness.  No chest pain, no nausea, no vomiting, no hematemesis, no abdominal pain, no focal weakness or sick contacts.  In the ED COVID PCR negative  ED Course: CBC demonstrating hemoglobin of 5.0; patient with soft blood pressure and appreciated tachycardia.  Otherwise hemodynamically stable and in no acute distress.  Patient was typed and crossed with check of antibodies but demonstrated to be positive and needing her blood to be transferred from [facility].  GI service was consulted, IV fluids and 2 large IV bore were requested. TRH contacted to place in the hospital for further evaluation and management.  Assessment & Plan:  1)-Symptomatic Anemia: In the setting of acute on chronic blood loss anemia -EGD on 03/03/2021 with gastric polyps; gastric AVM's x 2 (with APC);  -GI service recommends continuing IV PPI through 03/05/2021 -Hemoglobin above 9 after transfusion -GI service will consider capsule endoscopy if patient rebleeds  2-Gastrointestinal hemorrhage with melena -Please see #1 above -Patient with a prior  history of AVMs.  3-COPD (chronic obstructive pulmonary disease) (HCC) -Stable without acute exacerbation, okay to continue bronchodilators  4-Depression/anxiety -No suicidal ideation hallucination -Continue Zoloft and as needed use of alprazolam.  5-history of chronic pain syndrome -Continue as needed Tylenol, Neurontin and oxycodone. -Patient expressed pain to be well controlled currently.  6-history of [name] -Continue Neurontin -Continue patient follow-up with neurology service  7)Generalized weakness/ambulatory dysfunction in the setting of GBS, COPD and symptomatic anemia----patient will most likely benefit from SNF rehab -Await physical therapy eval   DVT prophylaxis: SCDs. Code Status: Full code. Family Communication: Discussed with patient who will update family disposition:   Status is: Inpatient  Dispo: The patient is from: Skilled nursing facility              Anticipated d/c is to: Skilled nursing facility              Patient currently no medically stable for discharge; -Needs IV Protonix, may need capsule endoscopy if H&H continues to drop   Difficult to place patient no     Consultants:   GI service   Procedures:  EGD planned for later today 03/03/2021   Antimicrobials:  None   Subjective: -No vomiting, no fevers, no significant abdominal pain, complains of fatigue, no dark stools Objective: Vitals:   03/03/21 2051 03/04/21 0415 03/04/21 0733 03/04/21 1348  BP: 137/64 (!) 128/56  128/67  Pulse: 79 79  76  Resp: 18 18  18   Temp: 98.7 F (37.1 C) 98.4 F (36.9 C)  98.6 F (37 C)  TempSrc: Oral Oral  Oral  SpO2: 95% 95% 92% 94%  Weight:      Height:  Intake/Output Summary (Last 24 hours) at 03/04/2021 1518 Last data filed at 03/04/2021 1300 Gross per 24 hour  Intake 1037.56 ml  Output 1300 ml  Net -262.44 ml   Filed Weights   03/02/21 0840 03/02/21 1300  Weight: 62.6 kg 62.9 kg    Examination:  Physical Exam  Gen:-  Awake Alert,  In no apparent distress  HEENT:- [place].AT, No sclera icterus Neck-Supple Neck,No JVD,.  Lungs-  CTAB , fair air movement CV- S1, S2 normal Abd-  +ve B.Sounds, Abd Soft, No tenderness,    Extremity/Skin:- No  edema,   good pulses Psych-affect is appropriate, oriented x3 Neuro-generalized weakness, ongoing neuromuscular deficits related to underlying GBS, no additional  new focal deficits, no tremors     Data Reviewed: I have personally reviewed following labs and imaging studies  CBC: Recent Labs  Lab 03/02/21 0859 03/03/21 0411 03/04/21 0509  WBC 10.4 7.8 9.2  NEUTROABS 7.4  --   --   HGB 5.0* 10.5* 9.8*  HCT 17.4* 32.6* 31.4*  MCV 97.8 93.7 94.9  PLT 335 260 244    Basic Metabolic Panel: Recent Labs  Lab 03/02/21 0859 03/03/21 0411  NA 134* 135  K 4.4 3.5  CL 99 100  CO2 27 27  GLUCOSE 107* 97  BUN 24* 14  CREATININE 0.30* 0.37*  CALCIUM 8.1* 8.0*    GFR: Estimated Creatinine Clearance: 49.9 mL/min (A) (by C-G formula based on SCr of 0.37 mg/dL (L)).  Liver Function Tests: Recent Labs  Lab 03/02/21 0859  AST 32  ALT 24  ALKPHOS 81  BILITOT 0.3  PROT 6.6  ALBUMIN 2.8*   CBG: No results for input(s): GLUCAP in the last 168 hours.   Recent Results (from the past 240 hour(s))  Resp Panel by RT-PCR (Flu A&B, Covid) Nasopharyngeal Swab     Status: None   Collection Time: 02/23/21 10:23 AM   Specimen: Nasopharyngeal Swab; Nasopharyngeal(NP) swabs in vial transport medium  Result Value Ref Range Status   SARS Coronavirus 2 by RT PCR NEGATIVE NEGATIVE Final    Comment: (NOTE) SARS-CoV-2 target nucleic acids are NOT DETECTED.  The SARS-CoV-2 RNA is generally detectable in upper respiratory specimens during the acute phase of infection. The lowest concentration of SARS-CoV-2 viral copies this assay can detect is 138 copies/mL. A negative result does not preclude SARS-Cov-2 infection and should not be used as the sole basis for treatment  or other patient management decisions. A negative result may occur with  improper specimen collection/handling, submission of specimen other than nasopharyngeal swab, presence of viral mutation(s) within the areas targeted by this assay, and inadequate number of viral copies(<138 copies/mL). A negative result must be combined with clinical observations, patient history, and epidemiological information. The expected result is Negative.  Fact Sheet for Patients:  EntrepreneurPulse.com.au  Fact Sheet for Healthcare Providers:  IncredibleEmployment.be  This test is no t yet approved or cleared by the [place] FDA and  has been authorized for detection and/or diagnosis of SARS-CoV-2 by FDA under an Emergency Use Authorization (EUA). This EUA will remain  in effect (meaning this test can be used) for the duration of the COVID-19 declaration under Section 564(b)(1) of the Act, 21 U.S.C.section 360bbb-3(b)(1), unless the authorization is terminated  or revoked sooner.       Influenza A by PCR NEGATIVE NEGATIVE Final   Influenza B by PCR NEGATIVE NEGATIVE Final    Comment: (NOTE) The Xpert Xpress SARS-CoV-2/FLU/RSV plus assay is intended as an aid  in the diagnosis of influenza from Nasopharyngeal swab specimens and should not be used as a sole basis for treatment. Nasal washings and aspirates are unacceptable for Xpert Xpress SARS-CoV-2/FLU/RSV testing.  Fact Sheet for Patients: EntrepreneurPulse.com.au  Fact Sheet for Healthcare Providers: IncredibleEmployment.be  This test is not yet approved or cleared by the [place] FDA and has been authorized for detection and/or diagnosis of SARS-CoV-2 by FDA under an Emergency Use Authorization (EUA). This EUA will remain in effect (meaning this test can be used) for the duration of the COVID-19 declaration under Section 564(b)(1) of the Act, 21 U.S.C. section  360bbb-3(b)(1), unless the authorization is terminated or revoked.  Performed at [facility], [address], [place], [place] [address]   Resp Panel by RT-PCR (Flu A&B, Covid) Nasopharyngeal Swab     Status: None   Collection Time: 03/02/21  9:15 AM   Specimen: Nasopharyngeal Swab; Nasopharyngeal(NP) swabs in vial transport medium  Result Value Ref Range Status   SARS Coronavirus 2 by RT PCR NEGATIVE NEGATIVE Final    Comment: (NOTE) SARS-CoV-2 target nucleic acids are NOT DETECTED.  The SARS-CoV-2 RNA is generally detectable in upper respiratory specimens during the acute phase of infection. The lowest concentration of SARS-CoV-2 viral copies this assay can detect is 138 copies/mL. A negative result does not preclude SARS-Cov-2 infection and should not be used as the sole basis for treatment or other patient management decisions. A negative result may occur with  improper specimen collection/handling, submission of specimen other than nasopharyngeal swab, presence of viral mutation(s) within the areas targeted by this assay, and inadequate number of viral copies(<138 copies/mL). A negative result must be combined with clinical observations, patient history, and epidemiological information. The expected result is Negative.  Fact Sheet for Patients:  EntrepreneurPulse.com.au  Fact Sheet for Healthcare Providers:  IncredibleEmployment.be  This test is no t yet approved or cleared by the [place] FDA and  has been authorized for detection and/or diagnosis of SARS-CoV-2 by FDA under an Emergency Use Authorization (EUA). This EUA will remain  in effect (meaning this test can be used) for the duration of the COVID-19 declaration under Section 564(b)(1) of the Act, 21 U.S.C.section 360bbb-3(b)(1), unless the authorization is terminated  or revoked sooner.       Influenza A by PCR NEGATIVE NEGATIVE Final   Influenza B by PCR NEGATIVE  NEGATIVE Final    Comment: (NOTE) The Xpert Xpress SARS-CoV-2/FLU/RSV plus assay is intended as an aid in the diagnosis of influenza from Nasopharyngeal swab specimens and should not be used as a sole basis for treatment. Nasal washings and aspirates are unacceptable for Xpert Xpress SARS-CoV-2/FLU/RSV testing.  Fact Sheet for Patients: EntrepreneurPulse.com.au  Fact Sheet for Healthcare Providers: IncredibleEmployment.be  This test is not yet approved or cleared by the [place] FDA and has been authorized for detection and/or diagnosis of SARS-CoV-2 by FDA under an Emergency Use Authorization (EUA). This EUA will remain in effect (meaning this test can be used) for the duration of the COVID-19 declaration under Section 564(b)(1) of the Act, 21 U.S.C. section 360bbb-3(b)(1), unless the authorization is terminated or revoked.  Performed at [facility], [address] [place], [place] [address]   MRSA PCR Screening     Status: None   Collection Time: 03/02/21 12:21 PM   Specimen: Nasopharyngeal  Result Value Ref Range Status   MRSA by PCR NEGATIVE NEGATIVE Final    Comment:        The GeneXpert MRSA Assay (  FDA approved for NASAL specimens only), is one component of a comprehensive MRSA colonization surveillance program. It is not intended to diagnose MRSA infection nor to guide or monitor treatment for MRSA infections. Performed at [facility], [address], [place], [place] [address]      Radiology Studies: No results found.   Scheduled Meds: . Chlorhexidine Gluconate Cloth  6 each Topical Q0600  . umeclidinium bromide  1 puff Inhalation Daily   And  . fluticasone furoate-vilanterol  1 puff Inhalation Daily  . gabapentin  300 mg Oral Q8H  . montelukast  10 mg Oral QHS  . multivitamin-lutein  1 capsule Oral BID  . [START ON 03/05/2021] pantoprazole  40 mg Intravenous Q12H  . polyethylene glycol  17 g Oral Daily  .  sertraline  25 mg Oral Daily   Continuous Infusions: . sodium chloride Stopped (03/02/21 1301)  . sodium chloride Stopped (03/02/21 1301)  . pantoprozole (PROTONIX) infusion 8 mg/hr (03/04/21 1040)     LOS: 2 days   Roxan Hockey, MD [facility]   To contact the attending provider between 7A-7P or the covering provider during after hours 7P-7A, please log into the web site [email or web address] and access using universal [place] password for that web site. If you do not have the password, please call the hospital operator.  03/04/2021, 3:18 PM

## 2021-03-04 NOTE — Progress Notes (Signed)
Referral from Nursing that patient had a recent death in the family and might need support. Found Alexa Nichols laying in her ICU bed having just arrived from ENDO. She welcomed Chaplain warmly bedside and engaged well in reflection around the death of her grandson who passed in February 2022 and then a few weeks later she began having symptoms of GB syndrome. She was tearful today as she shared openly. Chaplain provided a safe space for her to reflect and to cry and grieve ending with prayer. She was grateful for visit. Chaplain will remain available in order to provide spiritual support and to assess for spiritual need.

## 2021-03-04 NOTE — Op Note (Signed)
[facility] Patient Name: Alexa Nichols Procedure Date: 03/03/2021 11:22 AM MRN: [id number] Date of Birth: August 08, 1946 Attending MD: [name] ,  CSN: [id number] Age: 75 Admit Type: Inpatient Procedure:                Upper GI endoscopy Indications:              Iron deficiency anemia Providers:                [name], Jeanann Lewandowsky. Sharon Seller, RN, Randa Spike, Technician Referring MD:              Medicines:                Monitored Anesthesia Care Complications:            No immediate complications. Estimated Blood Loss:      Procedure:                Pre-Anesthesia Assessment:                           - Prior to the procedure, a History and Physical                            was performed, and patient medications, allergies                            and sensitivities were reviewed. The patient's                            tolerance of previous anesthesia was reviewed.                           - The risks and benefits of the procedure and the                            sedation options and risks were discussed with the                            patient. All questions were answered and informed                            consent was obtained.                           - ASA Grade Assessment: III - A patient with severe                            systemic disease.                           After obtaining informed consent, the endoscope was                            passed under direct vision. Throughout the  procedure, the patient's blood pressure, pulse, and                            oxygen saturations were monitored continuously. The                            GIF-H190 ([id number]) scope was introduced through the                            mouth, and advanced to the proximal jejunum. The                            upper GI endoscopy was accomplished without                            difficulty. The  patient tolerated the procedure                            well. Scope In: 11:41:58 AM Scope Out: 12:11:50 PM Total Procedure Duration: 0 hours 29 minutes 52 seconds  Findings:      The examined esophagus was normal.      Two 3 to 8 mm angiodysplastic lesions with no bleeding were found on the       posterior wall of the stomach. Coagulation for bleeding prevention using       argon plasma at 0.5 liters/minute and 25 watts was successful.      Multiple medium sessile polyps with no bleeding and no stigmata of       recent bleeding were found in the gastric fundus and in the gastric body.      The examined duodenum was normal.      The examined jejunum was normal. Impression:               - Normal esophagus.                           - Two non-bleeding angiodysplastic lesions in the                            stomach. Treated with argon plasma coagulation                            (APC).                           - Multiple gastric polyps.                           - Normal examined duodenum.                           - Normal examined jejunum.                           - No specimens collected. Moderate Sedation:      Per Anesthesia Care Recommendation:           - Return patient to hospital ward for ongoing  care.                           - Resume previous diet.                           - Check H/H daily                           - Continue oral ferrous sulfate 325 mg BID.                           - If patient has decline in hemoglobin will need to                            consider a repeat capsule endoscopy. Procedure Code(s):        --- Professional ---                           [phone], Esophagogastroduodenoscopy, flexible,                            transoral; with control of bleeding, any method Diagnosis Code(s):        --- Professional ---                           E42.353, Angiodysplasia of stomach and duodenum                            without bleeding                            K31.7, Polyp of stomach and duodenum                           D50.9, Iron deficiency anemia, unspecified CPT copyright 2019 American Medical Association. All rights reserved. The codes documented in this report are preliminary and upon coder review may  be revised to meet current compliance requirements. [name], MD [name],  03/04/2021 7:38:51 AM This report has been signed electronically. Number of Addenda: 0

## 2021-03-05 ENCOUNTER — Telehealth: Payer: Self-pay | Admitting: Gastroenterology

## 2021-03-05 LAB — RESP PANEL BY RT-PCR (FLU A&B, COVID) ARPGX2
Influenza A by PCR: NEGATIVE
Influenza B by PCR: NEGATIVE
SARS Coronavirus 2 by RT PCR: NEGATIVE

## 2021-03-05 LAB — CBC
HCT: 33.2 % — ABNORMAL LOW (ref 36.0–46.0)
Hemoglobin: 10.1 g/dL — ABNORMAL LOW (ref 12.0–15.0)
MCH: 29.4 pg (ref 26.0–34.0)
MCHC: 30.4 g/dL (ref 30.0–36.0)
MCV: 96.8 fL (ref 80.0–100.0)
Platelets: 284 10*3/uL (ref 150–400)
RBC: 3.43 MIL/uL — ABNORMAL LOW (ref 3.87–5.11)
RDW: 16 % — ABNORMAL HIGH (ref 11.5–15.5)
WBC: 7.4 10*3/uL (ref 4.0–10.5)
nRBC: 0 % (ref 0.0–0.2)

## 2021-03-05 LAB — BASIC METABOLIC PANEL
Anion gap: 7 (ref 5–15)
BUN: 9 mg/dL (ref 8–23)
CO2: 28 mmol/L (ref 22–32)
Calcium: 8.7 mg/dL — ABNORMAL LOW (ref 8.9–10.3)
Chloride: 104 mmol/L (ref 98–111)
Creatinine, Ser: 0.31 mg/dL — ABNORMAL LOW (ref 0.44–1.00)
GFR, Estimated: >60 mL/min (ref >60)
Glucose, Bld: 87 mg/dL (ref 70–99)
Potassium: 3.9 mmol/L (ref 3.5–5.1)
Sodium: 139 mmol/L (ref 135–145)

## 2021-03-05 MED ORDER — OXYCODONE HCL 10 MG PO TABS: 10.0000 mg | ORAL_TABLET | Freq: Four times a day (QID) | ORAL | 0 refills | Status: DC | PRN

## 2021-03-05 MED ORDER — ONDANSETRON HCL 4 MG PO TABS: 4.0000 mg | ORAL_TABLET | Freq: Four times a day (QID) | ORAL | 0 refills | Status: DC | PRN

## 2021-03-05 MED ORDER — ALPRAZOLAM 0.25 MG PO TABS: 0.2500 mg | ORAL_TABLET | Freq: Every day | ORAL | 2 refills | Status: DC | PRN

## 2021-03-05 NOTE — TOC Transition Note (Signed)
Transition of Care [facility]) - CM/SW Discharge Note   Patient Details  Name: Alexa Nichols MRN: [id number] Date of Birth: [birth year]  Transition of Care [facility]) CM/SW Contact:  Boneta Lucks, RN Phone Number: 03/05/2021, 10:23 AM   Clinical Narrative:  Patient medically ready to discharge back to [facility]. Confirmed with Mardene Celeste for admission today. Patient needs new COVID test. MD/ RN updated. Medical necessity completed. RN to call report.    Final next level of care: Skilled Nursing Facility Barriers to Discharge: Barriers Resolved   Discharge Placement              Patient chooses bed at:  [facility]) Patient to be transferred to facility by: EMS   Patient and family notified of of transfer: 03/05/21  Discharge Plan and Services     Readmission Risk Interventions Readmission Risk Prevention Plan 03/05/2021 02/23/2021 02/18/2021  Post Dischage Appt - Complete -  Medication Screening - - Complete  Transportation Screening Complete - Complete  PCP or Specialist Appt within 5-7 Days Complete - -  Home Care Screening Complete - -  Medication Review (RN CM) Complete - -  Some recent data might be hidden

## 2021-03-05 NOTE — Progress Notes (Signed)
Patient picked up by EMS transport to for discharge to [facility]. Verbal/telephone confirmation with [facility] on patients transport completed. Patient left floor via stretcher at 23:40.

## 2021-03-05 NOTE — Discharge Instructions (Signed)
1)Avoid ibuprofen/Advil/Aleve/Motrin/Goody Powders/Naproxen/BC powders/Meloxicam/Diclofenac/Indomethacin and other Nonsteroidal anti-inflammatory medications as these will make you more likely to bleed and can cause stomach ulcers, can also cause Kidney problems.   2)Repeat CBC and BMP on Monday 03/09/21  3)Follow-up Gastroenterologist Dr. Jenetta Downer with [facility]--- within 1 week for evaluation--- -Address: [address] [place], [place] [address], Phone: [phone]

## 2021-03-05 NOTE — Evaluation (Signed)
Physical Therapy Evaluation Patient Details Name: Alexa Nichols MRN: [id number] DOB: 07-05-1946 Today's Date: 03/05/2021   History of Present Illness  Alexa Nichols is a 75 y.o. female with medical history significant of asthma/COPD, depression/anxiety, history of [name] syndrome, gastroesophageal flux disease, history of GI bleed secondary to gastric/duodenal AVMs and iron deficiency anemia; who presented to the hospital secondary to increased weakness.  Patient with recent admission secondary to [name] syndrome discharge to a skilled nursing facility for further care and rehabilitation.  During blood work routinely done at her skilled nursing facility patient was found to have a significantly low hemoglobin of 4.9.  Patient expressed increased fatigue, shortness of breath with activity and generalized weakness.  No chest pain, no nausea, no vomiting, no hematemesis, no abdominal pain, no focal weakness or sick contacts.    Clinical Impression  Patient demonstrates slow labored movement for sitting up at bedside with [facility] raised and use of bed rail.  Patient unable to stand due to BLE weakness and required stand pivot with knees blocked to transfer to chair.  Patient demonstrates fair/good return for using BUE to scooting backwards in chair.  Patient tolerated sitting up in chair after therapy - nursing staff notified.  Patient will benefit from continued physical therapy in hospital and recommended venue below to increase strength, balance, endurance for safe ADLs and gait.      Follow Up Recommendations SNF    Equipment Recommendations  None recommended by PT    Recommendations for Other Services       Precautions / Restrictions Precautions Precautions: Fall Restrictions Weight Bearing Restrictions: No      Mobility  Bed Mobility Overal bed mobility: Needs Assistance Bed Mobility: Supine to Sit     Supine to sit: Mod assist;Max assist;HOB elevated     General  bed mobility comments: increased time, labored movement, required use of bed rail    Transfers Overall transfer level: Needs assistance Equipment used: 1 person hand held assist Transfers: Sit to/from Stand;Stand Pivot Transfers Sit to Stand: Max assist Stand pivot transfers: Max assist       General transfer comment: unable to stand using RW due to BLE weakness, required stand pivot with knees blocked  Ambulation/Gait                Stairs            Wheelchair Mobility    Modified Rankin (Stroke Patients Only)       Balance Overall balance assessment: Needs assistance Sitting-balance support: Feet supported;No upper extremity supported Sitting balance-Leahy Scale: Fair Sitting balance - Comments: seated at EOB   Standing balance support: During functional activity;No upper extremity supported Standing balance-Leahy Scale: Zero Standing balance comment: unable to maintain standing balance attempting to use RW                             Pertinent Vitals/Pain Pain Assessment: Faces Faces Pain Scale: Hurts little more Pain Location: RLE with pressure, movement Pain Descriptors / Indicators: Sore;Grimacing;Guarding Pain Intervention(s): Limited activity within patient's tolerance;Monitored during session;Repositioned    Home Living Family/patient expects to be discharged to:: Private residence Living Arrangements: Spouse/significant other Available Help at Discharge: Family;Friend(s);Available PRN/intermittently Type of Home: House Home Access: Stairs to enter Entrance Stairs-Rails: Psychiatric nurse of Steps: 3 Home Layout: One level Home Equipment: Walker - 4 wheels;Bedside commode      Prior Function Level of Independence: Needs assistance  Gait / Transfers Assistance Needed: spouse assisting with in/out of bed, transfers  ADL's / Homemaking Assistance Needed: spouse performing cooking, cleaning; pt taking sponge  baths, spouse assists with toileting  Comments: Pt reports independent prior to hospitalization last month     Hand Dominance   Dominant Hand: Right    Extremity/Trunk Assessment   Upper Extremity Assessment Upper Extremity Assessment: Generalized weakness    Lower Extremity Assessment Lower Extremity Assessment: Generalized weakness;RLE deficits/detail;LLE deficits/detail RLE Deficits / Details: grossly -2/5 RLE Sensation: WNL RLE Coordination: decreased gross motor;decreased fine motor LLE Deficits / Details: grossly -2/5 LLE Sensation: WNL LLE Coordination: decreased gross motor;decreased fine motor    Cervical / Trunk Assessment Cervical / Trunk Assessment: Normal  Communication   Communication: No difficulties  Cognition Arousal/Alertness: Awake/alert Behavior During Therapy: WFL for tasks assessed/performed Overall Cognitive Status: Within Functional Limits for tasks assessed                                        General Comments      Exercises     Assessment/Plan    PT Assessment Patient needs continued PT services  PT Problem List Decreased strength;Decreased activity tolerance;Decreased balance;Decreased mobility       PT Treatment Interventions DME instruction;Gait training;Functional mobility training;Therapeutic activities;Therapeutic exercise;Balance training;Neuromuscular re-education;Patient/family education    PT Goals (Current goals can be found in the Care Plan section)  Acute Rehab PT Goals Patient Stated Goal: return home after rehab PT Goal Formulation: With patient Time For Goal Achievement: 03/19/21 Potential to Achieve Goals: Good    Frequency Min 3X/week   Barriers to discharge        Co-evaluation               AM-PAC PT "6 Clicks" Mobility  Outcome Measure Help needed turning from your back to your side while in a flat bed without using bedrails?: A Lot Help needed moving from lying on your back to  sitting on the side of a flat bed without using bedrails?: A Lot Help needed moving to and from a bed to a chair (including a wheelchair)?: A Lot Help needed standing up from a chair using your arms (e.g., wheelchair or bedside chair)?: A Lot Help needed to walk in hospital room?: Total Help needed climbing 3-5 steps with a railing? : Total 6 Click Score: 10    End of Session   Activity Tolerance: Patient tolerated treatment well;Patient limited by fatigue Patient left: in chair;with call bell/phone within reach Nurse Communication: Mobility status PT Visit Diagnosis: Unsteadiness on feet (R26.81);Other abnormalities of gait and mobility (R26.89);Muscle weakness (generalized) (M62.81)    Time: 1610-9604 PT Time Calculation (min) (ACUTE ONLY): 20 min   Charges:   PT Evaluation $PT Eval Moderate Complexity: 1 Mod PT Treatments $Therapeutic Activity: 8-22 mins        10:28 AM, 03/05/21 Lonell Grandchild, MPT Physical Therapist with [facility] [phone] office [phone] mobile phone

## 2021-03-05 NOTE — Telephone Encounter (Signed)
Opened in error

## 2021-03-05 NOTE — Progress Notes (Signed)
Patient being discharged today. She has an appointment 03/11/21 with Dr. Jenetta Downer which she will need to keep.   Alexa Nichols. Alexa Nichols [facility] [phone] 6/2/202211:36 AM

## 2021-03-05 NOTE — Plan of Care (Signed)
  Problem: Acute Rehab PT Goals(only PT should resolve) Goal: Pt Will Go Supine/Side To Sit Outcome: Progressing Flowsheets (Taken 03/05/2021 1029) Pt will go Supine/Side to Sit:  with moderate assist  with minimal assist Goal: Patient Will Transfer Sit To/From Stand Outcome: Progressing Flowsheets (Taken 03/05/2021 1029) Patient will transfer sit to/from stand:  with moderate assist  with maximum assist Goal: Pt Will Transfer Bed To Chair/Chair To Bed Outcome: Progressing Flowsheets (Taken 03/05/2021 1029) Pt will Transfer Bed to Chair/Chair to Bed:  with mod assist  with max assist Goal: Pt Will Ambulate Outcome: Progressing Flowsheets (Taken 03/05/2021 1029) Pt will Ambulate:  10 feet  with moderate assist  with maximum assist  with rolling walker   10:30 AM, 03/05/21 Lonell Grandchild, MPT Physical Therapist with [place] [phone] office [phone] mobile phone

## 2021-03-05 NOTE — Discharge Summary (Signed)
Alexa Nichols, is a 75 y.o. female  DOB March 08, 1946  MRN [id number].  Admission date:  03/02/2021  Admitting Physician  Barton Dubois, MD  Discharge Date:  03/05/2021   Primary MD  [name], MD  Recommendations for primary care physician for things to follow:   1)Avoid ibuprofen/Advil/Aleve/Motrin/Goody Powders/Naproxen/BC powders/Meloxicam/Diclofenac/Indomethacin and other Nonsteroidal anti-inflammatory medications as these will make you more likely to bleed and can cause stomach ulcers, can also cause Kidney problems.   2)Repeat CBC and BMP on Monday 03/09/21  3)Follow-up Gastroenterologist Dr. Jenetta Downer with [facility]--- within 1 week for evaluation--- -Address: [address] [place], [place] [address], Phone: [phone]   Admission Diagnosis  Gastrointestinal hemorrhage with melena [K92.1] Symptomatic anemia [D64.9]   Discharge Diagnosis  Gastrointestinal hemorrhage with melena [K92.1] Symptomatic anemia [D64.9]    Principal Problem:   Symptomatic anemia Active Problems:   Gastrointestinal hemorrhage with melena   COPD (chronic obstructive pulmonary disease) (HCC)   Depression   AIDP (acute inflammatory demyelinating polyneuropathy) /GBS   Constipation      Past Medical History:  Diagnosis Date  . Asthma   . Chronic low back pain   . COPD (chronic obstructive pulmonary disease) ([place])   . Depression   . GERD (gastroesophageal reflux disease)   . Guillain Barr syndrome ([place])   . Heart murmur   . Lobular carcinoma of left breast ([place]) 1997   in situ  . Other and unspecified hyperlipidemia   . Seasonal allergies     Past Surgical History:  Procedure Laterality Date  . BIOPSY  12/03/2020   Procedure: BIOPSY;  Surgeon: Rogene Houston, MD;  [facility];  Service: Endoscopy;;  gastric polyps biopsies;  . BREAST SURGERY Left    lumpectomy    . CESAREAN SECTION  1986  . CHOLECYSTECTOMY    . COLONOSCOPY WITH PROPOFOL N/A 12/03/2020    two 4-7 mm polyps in cecum, one small polyp in cecum, one 5 mm polyp, sigmoid diverticulosis, path with tubular adenomas  . ESOPHAGOGASTRODUODENOSCOPY (EGD) WITH PROPOFOL N/A 12/03/2020   normal esophagus, multiple gastric polyps s/p biopsy, small paraesophageal hernia, normal duodenum. Fundic gland polyps.  . ESOPHAGOGASTRODUODENOSCOPY (EGD) WITH PROPOFOL N/A 02/05/2021   Procedure: ESOPHAGOGASTRODUODENOSCOPY (EGD) WITH PROPOFOL;  Surgeon: [name], MD;  [facility];  Service: Endoscopy;  Laterality: N/A;  . ESOPHAGOGASTRODUODENOSCOPY (EGD) WITH PROPOFOL N/A 03/03/2021   Procedure: ESOPHAGOGASTRODUODENOSCOPY (EGD) WITH PROPOFOL;  Surgeon: [name], MD;  [facility];  Service: Gastroenterology;  Laterality: N/A;  with possible push enteroscopy utilizing the pediatric colonoscope  . GIVENS CAPSULE STUDY N/A 12/16/2020   normal small bowel capsule but rapid transit time of 21 minutes. Recommended to resume alendronate.   Marland Kitchen GIVENS CAPSULE STUDY N/A 02/04/2021   Procedure: GIVENS CAPSULE STUDY;  Surgeon: Rogene Houston, MD;  [facility];  Service: Endoscopy;  Laterality: N/A;  . HOT HEMOSTASIS  02/05/2021   Procedure: HOT HEMOSTASIS (ARGON PLASMA COAGULATION/BICAP);  Surgeon: [name],  MD;  [facility];  Service: Endoscopy;;  . HOT HEMOSTASIS  03/03/2021   Procedure: HOT HEMOSTASIS (ARGON PLASMA COAGULATION/BICAP);  Surgeon: [name], [name], MD;  [facility];  Service: Gastroenterology;;  . TONSILLECTOMY     age 76      HPI  from the history and physical done on the day of admission:    Chief Complaint: Increased fatigue and low hemoglobin level.  HPI: Alexa Nichols is a 75 y.o. female with medical history significant of asthma/COPD, depression/anxiety, history of Gilliam Barr syndrome, gastroesophageal flux  disease, history of GI bleed secondary to gastric/duodenal AVMs and iron deficiency anemia; who presented to the hospital secondary to increased weakness.  Patient with recent admission secondary to Alexa Nichols syndrome discharge to a skilled nursing facility for further care and rehabilitation.  During blood work routinely done at her skilled nursing facility patient was found to have a significantly low hemoglobin of 4.9.  Patient expressed increased fatigue, shortness of breath with activity and generalized weakness.  No chest pain, no nausea, no vomiting, no hematemesis, no abdominal pain, no focal weakness or sick contacts.  In the ED COVID PCR negative  ED Course: CBC demonstrating hemoglobin of 5.0; patient with soft blood pressure and appreciated tachycardia.  Otherwise hemodynamically stable and in no acute distress.  Patient was typed and crossed with check of antibodies but demonstrated to be positive and needing her blood to be transferred from [facility].  GI service was consulted, IV fluids and 2 large IV bore were requested. TRH contacted to place in the hospital for further evaluation and management.  Review of Systems: As per HPI otherwise all other systems reviewed and are negative.    Hospital Course:   Assessment & Plan:  1)-Symptomatic Anemia: In the setting of acute on chronic blood loss anemia -EGD on 03/03/2021 with gastric polyps; gastric AVM's x 2 (with [facility]);  -Received IV Protonix  through 03/05/2021 -Discharge on p.o. Protonix twice daily -Hemoglobin above 10 after transfusion -GI service will consider capsule endoscopy if patient rebleeds in the future  2-Gastrointestinal Hemorrhage with melena -Please see #1 above -Patient with a prior history of AVMs.  3-COPD (chronic obstructive pulmonary disease) (HCC) -Stable without acute exacerbation, okay to continue bronchodilators  4-Depression/anxiety -No suicidal ideation hallucination -Continue  Zoloft and as needed use of alprazolam.  5-history of chronic pain syndrome -Continue as needed Tylenol, Neurontin and oxycodone. -Patient expressed pain to be well controlled currently.  6-history of Alexa Nichols -Continue Neurontin -Continue patient follow-up with neurology service  7)Generalized weakness/ambulatory dysfunction in the setting of GBS, COPD and symptomatic anemia----physical therapy evaluation appreciated  -Recommends SNF rehab    DVT prophylaxis: SCDs. Code Status: Full code. Family Communication: Discussed with patient who will update family Disposition:   Dispo: The patient is from: Skilled nursing facility  Anticipated d/c is to: Skilled nursing facility    Consultants:   GI service   Procedures:  EGD 03/03/2021  Discharge Condition: stable  Follow UP   Follow-up Information    [name], [name], MD. Schedule an appointment as soon as possible for a visit in 1 week(s).   Specialty: Internal Medicine Contact information: [place] [address] [phone]        [name], MD. Schedule an appointment as soon as possible for a visit in 1 week(s).   Specialty: Gastroenterology Contact information: [address]  [place] [address] [phone]  Consults obtained - Gi  Diet and Activity recommendation:  As advised  Discharge Instructions    Discharge Instructions    Call MD for:  difficulty breathing, headache or visual disturbances   Complete by: As directed    Call MD for:  persistant dizziness or light-headedness   Complete by: As directed    Call MD for:  persistant nausea and vomiting   Complete by: As directed    Call MD for:  temperature >100.4   Complete by: As directed    Diet - low sodium heart healthy   Complete by: As directed    Discharge instructions   Complete by: As directed    1)Avoid ibuprofen/Advil/Aleve/Motrin/Goody  Powders/Naproxen/BC powders/Meloxicam/Diclofenac/Indomethacin and other Nonsteroidal anti-inflammatory medications as these will make you more likely to bleed and can cause stomach ulcers, can also cause Kidney problems.   2)Repeat CBC and BMP on Monday 03/09/21  3)Follow-up Gastroenterologist Dr. Jenetta Downer with [facility]--- within 1 week for evaluation--- -Address: [address], [place], [place] [address], Phone: [phone]   Increase activity slowly   Complete by: As directed        Discharge Medications     Allergies as of 03/05/2021      Reactions   Aspirin    samter's syndrome   Codeine Itching   Sulfa Antibiotics    Unknown reaction   Other Rash   gel filled fentanyl patch, adhesive on that patch caused ithcing      Medication List    STOP taking these medications   tiZANidine 4 MG tablet Commonly known as: ZANAFLEX     TAKE these medications   acetaminophen 500 MG tablet Commonly known as: TYLENOL Take 500 mg by mouth every 6 (six) hours as needed for moderate pain or headache.   albuterol 0.63 MG/3ML nebulizer solution Commonly known as: ACCUNEB Take 1 ampule by nebulization every 6 (six) hours as needed for wheezing.   ALPRAZolam 0.25 MG tablet Commonly known as: XANAX Take 1 tablet (0.25 mg total) by mouth daily as needed for anxiety or sleep. What changed:   medication strength  reasons to take this   ferrous sulfate 325 (65 FE) MG tablet Take 1 tablet (325 mg total) by mouth daily with breakfast.   gabapentin 300 MG capsule Commonly known as: NEURONTIN Take 1 capsule (300 mg total) by mouth 3 (three) times daily.   montelukast 10 MG tablet Commonly known as: SINGULAIR Take 10 mg by mouth at bedtime.   ondansetron 4 MG tablet Commonly known as: ZOFRAN Take 1 tablet (4 mg total) by mouth every 6 (six) hours as needed for nausea.   Oxycodone HCl 10 MG Tabs Take 1 tablet (10 mg total) by mouth every 6 (six) hours as  needed. What changed: reasons to take this   pantoprazole 40 MG tablet Commonly known as: Protonix Take 1 tablet (40 mg total) by mouth 2 (two) times daily.   PreserVision AREDS 2 Caps Take 1 capsule by mouth in the morning and at bedtime.   sertraline 50 MG tablet Commonly known as: ZOLOFT Take 25 mg by mouth daily.   simvastatin 20 MG tablet Commonly known as: ZOCOR Take 20 mg by mouth daily.   Trelegy Ellipta 200-62.5-25 MCG/INH Aepb Generic drug: Fluticasone-Umeclidin-Vilant Inhale 1 puff into the lungs daily.   vitamin B-12 500 MCG tablet Commonly known as: CYANOCOBALAMIN Take 1 tablet (500 mcg total) by mouth daily.      Major procedures and Radiology Reports - PLEASE  review detailed and final reports for all details, in brief -   CT Head Wo Contrast  Result Date: 02/16/2021 CLINICAL DATA:  Rule out stroke.  Bilateral leg weakness for 2 weeks EXAM: CT HEAD WITHOUT CONTRAST TECHNIQUE: Contiguous axial images were obtained from the base of the skull through the vertex without intravenous contrast. COMPARISON:  None. FINDINGS: Brain: No evidence of acute infarction, hemorrhage, hydrocephalus, extra-axial collection or mass lesion/mass effect. Vascular: No hyperdense vessel or unexpected calcification. Skull: Normal. Negative for fracture or focal lesion. Sinuses/Orbits: Status post bilateral maxillary sinus median antrectomies. Moderate diffuse mucosal thickening noted involving the maxillary sinuses, sphenoid sinuses and ethmoid air cells. Mastoid air cells appear clear. Other: None. IMPRESSION: 1. No acute intracranial abnormalities. 2. Chronic sinus inflammation. Electronically Signed   By: Kerby Moors M.D.   On: 02/16/2021 16:44   MR BRAIN WO CONTRAST  Result Date: 02/17/2021 CLINICAL DATA:  Neuro deficit, acute stroke suspected. Difficulty walking. EXAM: MRI HEAD WITHOUT CONTRAST TECHNIQUE: Multiplanar, multiecho pulse sequences of the brain and surrounding structures  were obtained without intravenous contrast. COMPARISON:  CT head Feb 16 2021. FINDINGS: Brain: No acute infarction, hemorrhage, hydrocephalus, or extra-axial fluid collection. Mild for age T2/FLAIR hyperintensities within the white matter, most likely related to chronic microvascular ischemic disease. Incidental 11 mm calcified extra-axial, dural-based lesion along the posterior right vertex (series 11, image 23), likely a meningioma. No mass effect or surrounding edema. Vascular: Major arterial flow voids are maintained at the skull base. Skull and upper cervical spine: Normal marrow signal. Sinuses/Orbits: Evidence of prior endoscopic sinus surgery with bilateral maxillary antrostomies and ethmoidectomies. Moderate pansinus mucosal thickening, greatest in the ethmoid air cells and sphenoid sinuses. Air-fluid level in the right sphenoid sinus. Other: Small right mastoid effusion. IMPRESSION: 1. No evidence of acute intracranial abnormality. Specifically, no acute infarct. 2. Incidental presumed 11 mm meningioma along the vertex without mass effect or surrounding edema. 3. Evidence of prior endoscopic sinus surgery with moderate paranasal sinus mucosal thickening, as detailed above. Electronically Signed   By: Margaretha Sheffield MD   On: 02/17/2021 09:58   MR THORACIC SPINE WO CONTRAST  Result Date: 02/17/2021 CLINICAL DATA:  Lumbar radiculopathy. EXAM: MRI THORACIC SPINE WITHOUT CONTRAST TECHNIQUE: Multiplanar, multisequence MR imaging of the thoracic spine was performed. No intravenous contrast was administered. COMPARISON:  None. FINDINGS: Alignment:  Exaggerated thoracic kyphosis. Vertebrae: No fracture, evidence of discitis, or bone lesion. Anterior vertebral body/disc space ankylosis from T3 through T5 and at T6-7. Cord: Normal cord signal. Small indentation on the anterior cord surface at the C6-7 and T8-9 Paraspinal and other soft tissues: Negative. Disc levels: Small posterior disc protrude at C6-7,  C7-T1 and T1-2 causing small indentation on the thecal sac without significant spinal canal or neural foraminal stenosis. Small posterior disc protrusions at T6-7 and T8-9 causing small indentations on the anterior cord surface without cord signal abnormality, significant spinal canal stenosis or neural foraminal stenosis. No significant spinal canal or neural foraminal stenosis at the remaining thoracic levels. Right perineural cysts at T6-7 and T10-11 IMPRESSION: Exaggerated thoracic kyphosis and mild degenerative changes without high-grade spinal canal or neural foraminal stenosis at any level. Electronically Signed   By: Pedro Earls M.D.   On: 02/17/2021 17:09   MR LUMBAR SPINE WO CONTRAST  Result Date: 02/17/2021 CLINICAL DATA:  Lumbar radiculopathy, greater than 6 weeks. EXAM: MRI LUMBAR SPINE WITHOUT CONTRAST TECHNIQUE: Multiplanar, multisequence MR imaging of the lumbar spine was performed. No intravenous contrast  was administered. COMPARISON:  CT abdomen/pelvis 02/04/2021. FINDINGS: Segmentation: 5 lumbar vertebrae. The caudal most well-formed intervertebral disc space is designated L5-S1 Alignment: Trace L1-L2 grade 1 retrolisthesis. Trace L3-L4 grade 1 retrolisthesis. Trace L5-S1 grade 1 anterolisthesis. Vertebrae: Vertebral body height is maintained. No significant marrow edema or focal suspicious osseous lesion. L2 vertebral body hemangioma. Conus medullaris and cauda equina: Conus extends to the L2 level. No signal abnormality within the visualized distal spinal cord. Paraspinal and other soft tissues: Tiny T2 hyperintense lesion within the right kidney, too small to characterize but likely reflecting a cyst. Paraspinal soft tissues within normal limits. Disc levels: Mild disc degeneration at the L2-L3 through L5-S1 levels, greatest at L3-L4 and L5-S1. T12-L1: Imaged sagittally. Shallow disc bulge. No significant spinal canal or foraminal stenosis. L1-L2: No significant disc  herniation or stenosis. L2-L3: Minimal disc bulge. No significant spinal canal or foraminal stenosis. L3-L4: Disc bulge. Superimposed shallow broad-based right subarticular to right foraminal disc protrusion. Mild relative right subarticular narrowing without frank nerve root impingement. Central canal patent. Minimal relative right neural foraminal narrowing without nerve root impingement. L4-L5: Minimal disc bulge. Minimal facet arthrosis. Mild ligamentum flavum hypertrophy on the right. No significant spinal canal or foraminal stenosis. L5-S1: Disc bulge. Mild endplate spurring. Facet arthrosis/ligamentum flavum hypertrophy. Bilateral subarticular narrowing (mild right, mild/moderate left) with some crowding of the left greater than right descending S1 nerve roots (series 19, image 25). Central canal patent. Mild relative bilateral neural foraminal narrowing. IMPRESSION: Lumbar spondylosis, as outlined and with findings most notably as follows. At L5-S1, there is trace grade 1 anterolisthesis. Mild disc degeneration. Disc bulge. Mild endplate spurring. Facet arthrosis/ligamentum flavum hypertrophy. Bilateral subarticular narrowing (mild right, mild/moderate left) with some crowding of the left greater than right descending S1 nerve roots. Mild relative bilateral neural foraminal narrowing. At L3-L4, there is trace retrolisthesis. Mild disc degeneration. Superimposed shallow broad-based right subarticular to right foraminal disc protrusion. Mild right subarticular narrowing without frank nerve root impingement. Minimal relative right neural foraminal narrowing without nerve root impingement. No significant spinal canal or neural foraminal stenosis at the remaining levels. Electronically Signed   By: Kellie Simmering DO   On: 02/17/2021 17:01   DG FL GUIDED LUMBAR PUNCTURE  Result Date: 02/18/2021 CLINICAL DATA:  Neurosarcoidosis. EXAM: DIAGNOSTIC LUMBAR PUNCTURE UNDER FLUOROSCOPIC GUIDANCE COMPARISON:  None.  FLUOROSCOPY TIME:  Radiation Exposure Index (if provided by the fluoroscopic device): 22.7 mGy. Number of Acquired Spot Images: 2. PROCEDURE: Informed consent was obtained from the patient prior to the procedure, including potential complications of headache, allergy, and pain. With the patient prone, the lower back was prepped with Betadine. 1% Lidocaine was used for local anesthesia. Under fluoroscopic guidance, lumbar puncture was performed at the L3-4 level using a 20 gauge needle with return of clear CSF. Ten ml of CSF were obtained for laboratory studies. Needle was removed and appropriate dressing was applied. The patient tolerated the procedure well and there were no apparent complications. IMPRESSION: Under fluoroscopic guidance, successful lumbar puncture was performed for diagnostic purposes. Electronically Signed   By: Marijo Conception M.D.   On: 02/18/2021 11:41   CT Angio Abd/Pel w/ and/or w/o  Result Date: 02/04/2021 CLINICAL DATA:  Obscure GI bleed, transfusion dependent anemia. EXAM: CTA ABDOMEN AND PELVIS WITHOUT AND WITH CONTRAST TECHNIQUE: Multidetector CT imaging of the abdomen and pelvis was performed using the standard protocol during bolus administration of intravenous contrast. Multiplanar reconstructed images and MIPs were obtained and reviewed to evaluate the vascular  anatomy. CONTRAST:  171mL OMNIPAQUE IOHEXOL 350 MG/ML SOLN COMPARISON:  12/07/2015 FINDINGS: VASCULAR Aorta: Mild calcified atheromatous plaque. No aneurysm, dissection, or stenosis. Celiac: Patent.  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 SMA: Patent without evidence of aneurysm, dissection, vasculitis or significant stenosis. Renals: Both renal arteries are patent without evidence of aneurysm, dissection, vasculitis, fibromuscular dysplasia or significant stenosis. IMA: Patent without evidence of aneurysm, dissection, vasculitis or significant stenosis. Inflow: Patent without evidence of aneurysm, dissection,  vasculitis or significant stenosis. Proximal Outflow: Bilateral common femoral and visualized portions of the superficial and profunda femoral arteries are patent without evidence of aneurysm, dissection, vasculitis or significant stenosis. Veins: Patent hepatic veins, portal vein, SMV, splenic vein, bilateral renal veins, iliac venous system and IVC. No venous pathology identified. Review of the MIP images confirms the above findings. NON-VASCULAR Lower chest: No pleural or pericardial effusion. Minimal linear scarring in the lung bases. Hepatobiliary: No focal liver abnormality is seen. Status post cholecystectomy. No biliary dilatation. Pancreas: Unremarkable. No pancreatic ductal dilatation or surrounding inflammatory changes. Spleen: Normal in size without focal abnormality. Adrenals/Urinary Tract: Adrenal glands unremarkable. Normal renal enhancement. No hydronephrosis. Urinary bladder physiologically distended. Stomach/Bowel: Stomach is nondistended. 6.1 cm posterior gastric diverticulum (previously 4.9) containing fluid level and hyperdense material. No associated wall thickening nor inflammatory change. The small bowel is nondilated. Normal appendix. The colon is nondilated, unremarkable. No evidence of active extravasation. Lymphatic: No abdominal or pelvic adenopathy. Reproductive: Uterus and bilateral adnexa are unremarkable. Other: No ascites.  No free air. Musculoskeletal: Lower lumbar spondylitic changes. No fracture or worrisome bone lesion. IMPRESSION: 1. No evidence of active extravasation into the bowel. 2. Slight interval enlargement of posterior gastric diverticulum without adjacent inflammatory/edematous change. Electronically Signed   By: Lucrezia Europe M.D.   On: 02/04/2021 13:40   Micro Results 3  Recent Results (from the past 240 hour(s))  Resp Panel by RT-PCR (Flu A&B, Covid) Nasopharyngeal Swab     Status: None   Collection Time: 03/02/21  9:15 AM   Specimen: Nasopharyngeal Swab;  Nasopharyngeal(NP) swabs in vial transport medium  Result Value Ref Range Status   SARS Coronavirus 2 by RT PCR NEGATIVE NEGATIVE Final    Comment: (NOTE) SARS-CoV-2 target nucleic acids are NOT DETECTED.  The SARS-CoV-2 RNA is generally detectable in upper respiratory specimens during the acute phase of infection. The lowest concentration of SARS-CoV-2 viral copies this assay can detect is 138 copies/mL. A negative result does not preclude SARS-Cov-2 infection and should not be used as the sole basis for treatment or other patient management decisions. A negative result may occur with  improper specimen collection/handling, submission of specimen other than nasopharyngeal swab, presence of viral mutation(s) within the areas targeted by this assay, and inadequate number of viral copies(<138 copies/mL). A negative result must be combined with clinical observations, patient history, and epidemiological information. The expected result is Negative.  Fact Sheet for Patients:  EntrepreneurPulse.com.au  Fact Sheet for Healthcare Providers:  IncredibleEmployment.be  This test is no t yet approved or cleared by the [place] FDA and  has been authorized for detection and/or diagnosis of SARS-CoV-2 by FDA under an Emergency Use Authorization (EUA). This EUA will remain  in effect (meaning this test can be used) for the duration of the COVID-19 declaration under Section 564(b)(1) of the Act, 21 U.S.C.section 360bbb-3(b)(1), unless the authorization is terminated  or revoked sooner.       Influenza A by PCR NEGATIVE NEGATIVE Final   Influenza B by  PCR NEGATIVE NEGATIVE Final    Comment: (NOTE) The Xpert Xpress SARS-CoV-2/FLU/RSV plus assay is intended as an aid in the diagnosis of influenza from Nasopharyngeal swab specimens and should not be used as a sole basis for treatment. Nasal washings and aspirates are unacceptable for Xpert Xpress  SARS-CoV-2/FLU/RSV testing.  Fact Sheet for Patients: EntrepreneurPulse.com.au  Fact Sheet for Healthcare Providers: IncredibleEmployment.be  This test is not yet approved or cleared by the [place] FDA and has been authorized for detection and/or diagnosis of SARS-CoV-2 by FDA under an Emergency Use Authorization (EUA). This EUA will remain in effect (meaning this test can be used) for the duration of the COVID-19 declaration under Section 564(b)(1) of the Act, 21 U.S.C. section 360bbb-3(b)(1), unless the authorization is terminated or revoked.  Performed at [facility], [address] [place], [place] [address]   MRSA PCR Screening     Status: None   Collection Time: 03/02/21 12:21 PM   Specimen: Nasopharyngeal  Result Value Ref Range Status   MRSA by PCR NEGATIVE NEGATIVE Final    Comment:        The GeneXpert MRSA Assay (FDA approved for NASAL specimens only), is one component of a comprehensive MRSA colonization surveillance program. It is not intended to diagnose MRSA infection nor to guide or monitor treatment for MRSA infections. Performed at [facility], [address] [place], [place] [address]    Today   Subjective    Alexa Nichols today has no new complaints-      No fever  Or chills   No Nausea, Vomiting or Diarrhea   Patient has been seen and examined prior to discharge   Objective   Blood pressure 129/66, pulse 81, temperature 98.3 F (36.8 C), temperature source Oral, resp. rate 18, height 4\' 10"  (1.473 m), weight 62.9 kg, SpO2 98 %.   Intake/Output Summary (Last 24 hours) at 03/05/2021 1100 Last data filed at 03/05/2021 0900 Gross per 24 hour  Intake 1200 ml  Output 2150 ml  Net -950 ml    Exam Gen:- Awake Alert, no acute distress  HEENT:- [place].AT, No sclera icterus Neck-Supple Neck,No JVD,.  Lungs-  CTAB , good air movement bilaterally  CV- S1, S2 normal, regular Abd-  +ve B.Sounds, Abd Soft,  No tenderness,    Extremity/Skin:- No  edema,   good pulses Psych-affect is appropriate, oriented x3 Neuro-generalized weakness, ongoing neuromuscular deficits related to underlying GBS, no additional  new focal deficits, no tremors   Data Review   CBC w Diff:  Lab Results  Component Value Date   WBC 7.4 03/05/2021   HGB 10.1 (L) 03/05/2021   HGB 7.0 (LL) 02/02/2021   HCT 33.2 (L) 03/05/2021   HCT 23.7 (L) 02/02/2021   PLT 284 03/05/2021   PLT 382 01/26/2021   LYMPHOPCT 18 03/02/2021   MONOPCT 9 03/02/2021   EOSPCT 1 03/02/2021   BASOPCT 0 03/02/2021    CMP:  Lab Results  Component Value Date   NA 139 03/05/2021   K 3.9 03/05/2021   CL 104 03/05/2021   CO2 28 03/05/2021   BUN 9 03/05/2021   CREATININE 0.31 (L) 03/05/2021   PROT 6.6 03/02/2021   ALBUMIN 2.8 (L) 03/02/2021   BILITOT 0.3 03/02/2021   ALKPHOS 81 03/02/2021   AST 32 03/02/2021   ALT 24 03/02/2021  .   Total Discharge time is about 33 minutes  Roxan Hockey M.D on 03/05/2021 at 11:00 AM  Go to [email or web address] -  for  Psychologist, counselling  [facility] - Office  [phone]

## 2021-03-06 DIAGNOSIS — Z299 Encounter for prophylactic measures, unspecified: Secondary | ICD-10-CM | POA: Diagnosis not present

## 2021-03-06 DIAGNOSIS — K5903 Drug induced constipation: Secondary | ICD-10-CM | POA: Diagnosis not present

## 2021-03-06 DIAGNOSIS — I700 Atherosclerosis of aorta: Secondary | ICD-10-CM | POA: Diagnosis not present

## 2021-03-06 DIAGNOSIS — K31819 Angiodysplasia of stomach and duodenum without bleeding: Secondary | ICD-10-CM | POA: Diagnosis not present

## 2021-03-06 DIAGNOSIS — E785 Hyperlipidemia, unspecified: Secondary | ICD-10-CM | POA: Diagnosis not present

## 2021-03-06 DIAGNOSIS — I10 Essential (primary) hypertension: Secondary | ICD-10-CM | POA: Diagnosis not present

## 2021-03-06 DIAGNOSIS — M545 Low back pain, unspecified: Secondary | ICD-10-CM | POA: Diagnosis not present

## 2021-03-06 DIAGNOSIS — R2689 Other abnormalities of gait and mobility: Secondary | ICD-10-CM | POA: Diagnosis not present

## 2021-03-06 DIAGNOSIS — R41841 Cognitive communication deficit: Secondary | ICD-10-CM | POA: Diagnosis not present

## 2021-03-06 DIAGNOSIS — R29898 Other symptoms and signs involving the musculoskeletal system: Secondary | ICD-10-CM | POA: Diagnosis not present

## 2021-03-06 DIAGNOSIS — R630 Anorexia: Secondary | ICD-10-CM | POA: Diagnosis not present

## 2021-03-06 DIAGNOSIS — M25562 Pain in left knee: Secondary | ICD-10-CM | POA: Diagnosis not present

## 2021-03-06 DIAGNOSIS — D649 Anemia, unspecified: Secondary | ICD-10-CM | POA: Diagnosis not present

## 2021-03-06 DIAGNOSIS — K5520 Angiodysplasia of colon without hemorrhage: Secondary | ICD-10-CM | POA: Diagnosis not present

## 2021-03-06 DIAGNOSIS — K922 Gastrointestinal hemorrhage, unspecified: Secondary | ICD-10-CM | POA: Diagnosis not present

## 2021-03-06 DIAGNOSIS — M549 Dorsalgia, unspecified: Secondary | ICD-10-CM | POA: Diagnosis not present

## 2021-03-06 DIAGNOSIS — J449 Chronic obstructive pulmonary disease, unspecified: Secondary | ICD-10-CM | POA: Diagnosis not present

## 2021-03-06 DIAGNOSIS — M79662 Pain in left lower leg: Secondary | ICD-10-CM | POA: Diagnosis not present

## 2021-03-06 DIAGNOSIS — F419 Anxiety disorder, unspecified: Secondary | ICD-10-CM | POA: Diagnosis not present

## 2021-03-06 DIAGNOSIS — M6281 Muscle weakness (generalized): Secondary | ICD-10-CM | POA: Diagnosis not present

## 2021-03-06 DIAGNOSIS — M25572 Pain in left ankle and joints of left foot: Secondary | ICD-10-CM | POA: Diagnosis not present

## 2021-03-06 DIAGNOSIS — G610 Guillain-Barre syndrome: Secondary | ICD-10-CM | POA: Diagnosis not present

## 2021-03-06 DIAGNOSIS — D509 Iron deficiency anemia, unspecified: Secondary | ICD-10-CM | POA: Diagnosis not present

## 2021-03-06 DIAGNOSIS — F32A Depression, unspecified: Secondary | ICD-10-CM | POA: Diagnosis not present

## 2021-03-09 ENCOUNTER — Encounter (INDEPENDENT_AMBULATORY_CARE_PROVIDER_SITE_OTHER): Payer: Medicare Other | Admitting: Ophthalmology

## 2021-03-10 DIAGNOSIS — G610 Guillain-Barre syndrome: Secondary | ICD-10-CM | POA: Diagnosis not present

## 2021-03-10 DIAGNOSIS — I10 Essential (primary) hypertension: Secondary | ICD-10-CM | POA: Diagnosis not present

## 2021-03-10 DIAGNOSIS — Z299 Encounter for prophylactic measures, unspecified: Secondary | ICD-10-CM | POA: Diagnosis not present

## 2021-03-10 DIAGNOSIS — R630 Anorexia: Secondary | ICD-10-CM | POA: Diagnosis not present

## 2021-03-10 DIAGNOSIS — F419 Anxiety disorder, unspecified: Secondary | ICD-10-CM | POA: Diagnosis not present

## 2021-03-11 ENCOUNTER — Encounter (INDEPENDENT_AMBULATORY_CARE_PROVIDER_SITE_OTHER): Payer: Self-pay

## 2021-03-11 ENCOUNTER — Other Ambulatory Visit: Payer: Self-pay

## 2021-03-11 ENCOUNTER — Ambulatory Visit (INDEPENDENT_AMBULATORY_CARE_PROVIDER_SITE_OTHER): Payer: Medicare Other | Admitting: Gastroenterology

## 2021-03-12 ENCOUNTER — Encounter (INDEPENDENT_AMBULATORY_CARE_PROVIDER_SITE_OTHER): Payer: Self-pay | Admitting: Gastroenterology

## 2021-03-12 ENCOUNTER — Ambulatory Visit (INDEPENDENT_AMBULATORY_CARE_PROVIDER_SITE_OTHER): Payer: Medicare Other | Admitting: Gastroenterology

## 2021-03-12 VITALS — BP 120/75 | HR 94 | Temp 99.1°F | Ht <= 58 in

## 2021-03-12 DIAGNOSIS — K31819 Angiodysplasia of stomach and duodenum without bleeding: Secondary | ICD-10-CM

## 2021-03-12 DIAGNOSIS — K5520 Angiodysplasia of colon without hemorrhage: Secondary | ICD-10-CM | POA: Insufficient documentation

## 2021-03-12 DIAGNOSIS — Z299 Encounter for prophylactic measures, unspecified: Secondary | ICD-10-CM | POA: Diagnosis not present

## 2021-03-12 DIAGNOSIS — D509 Iron deficiency anemia, unspecified: Secondary | ICD-10-CM | POA: Diagnosis not present

## 2021-03-12 DIAGNOSIS — J449 Chronic obstructive pulmonary disease, unspecified: Secondary | ICD-10-CM | POA: Diagnosis not present

## 2021-03-12 DIAGNOSIS — I10 Essential (primary) hypertension: Secondary | ICD-10-CM | POA: Diagnosis not present

## 2021-03-12 DIAGNOSIS — G610 Guillain-Barre syndrome: Secondary | ICD-10-CM | POA: Diagnosis not present

## 2021-03-12 DIAGNOSIS — K5903 Drug induced constipation: Secondary | ICD-10-CM

## 2021-03-12 NOTE — Patient Instructions (Signed)
Continue oral iron Start taking Miralax 1 capful every 12 hours. If after two weeks there is no improvement, increase to 1 capful every 8 hours Patient to be moved to commode to have a bowel movement Check H/H every week, we will request most recent lab results Referral to neurology for GBS

## 2021-03-12 NOTE — Progress Notes (Signed)
Alexa Nichols, M.D. Gastroenterology & Hepatology [facility] [address] [place], [place] [address]  Primary Care Physician: [name], MD [place] [id number]  I will communicate my assessment and recommendations to the referring MD via EMR.  Problems: Gastric and duodenal AVMs Iron deficiency anemia secondary to AVMs  History of Present Illness: Alexa Nichols is a 75 y.o. female with past medical history of Alexa Nichols, recurrent iron deficiency anemia due to AVMs, COPD, asthma, depression, GERD, liver carcinoma of the breast, who presents for follow up after recent hospitalization for recurrent iron deficiency anemia.    The patient was hospitalized on 03/02/2021 after presenting severe anemia as outpatient.  Was found to have a hemoglobin of 5 for which she had to undergo blood transfusion during the hospitalization.  The patient underwent an EGD with push enteroscopy on 03/03/2021, was found to have 2 AVMs in the gastric body with largest size of 8 mm which were ablated with APC.  There were also multiple polyps without presence of any inflammatory changes or recent bleeding.  The duodenum and proximal jejunum, as well as the esophagus were within normal limits.  The patient was discharged on oral iron supplementation.  Her most recent hemoglobin in her records was 10.1 upon discharge.  Patient reports that she is feeling better than in the past in terms of her fatigue.  Denies having any shortness of breath, lightheadedness, fatigue.  She is taking ferrous sulfate daily compliantly.  Due to her iron intake, she is having constipation as she describes having a a BM once a week which is very hard. She has some mild stinging pain in her LUQ but this is occasionally and does not bother her significantly. Does not know if she has had any melena or hematochezia but has not been told about this by the rehab staff.  She feels  overall frustrated with the care she has received at the rehab facility as she has not been having frequent rehab exercises and they have not been moving her to a commode to have a bowel movement.The patient denies having any nausea, vomiting, fever, chills, hematemesis, abdominal distention, diarrhea, jaundice, pruritus or weight loss.  Patient reports that she had blood drawn x2 recently at the rehab facility but no reports are available.  She is eating well.  Feeling well overall.  She is taking oxycodone very occasionally when she has significant abdominal pain.   Last EGD: As above Last Colonoscopy: 12/03/2020, 2 polyps resected from the cecum 3 polyps resected from the cecum with largest size of 7 mm, there was a polyp of 5 mm in the hepatic flexure, diverticulosis and external hemorrhoids.  Pathology of the polyps was consistent with a tubular adenoma.  She was recommended to have a repeat colonoscopy in 5 years  Past Medical History: Past Medical History:  Diagnosis Date   Asthma    Chronic low back pain    COPD (chronic obstructive pulmonary disease) (HCC)    Depression    GERD (gastroesophageal reflux disease)    Guillain Barr syndrome (HCC)    Heart murmur    Lobular carcinoma of left breast ([place]) 1997   in situ   Other and unspecified hyperlipidemia    Seasonal allergies     Past Surgical History: Past Surgical History:  Procedure Laterality Date   BIOPSY  12/03/2020   Procedure: BIOPSY;  Surgeon: Rogene Houston, MD;  [facility];  Service: Endoscopy;;  gastric polyps biopsies;   BREAST SURGERY Left    lumpectomy    CESAREAN SECTION  1986   CHOLECYSTECTOMY     COLONOSCOPY WITH PROPOFOL N/A 12/03/2020    two 4-7 mm polyps in cecum, one small polyp in cecum, one 5 mm polyp, sigmoid diverticulosis, path with tubular adenomas   ESOPHAGOGASTRODUODENOSCOPY (EGD) WITH PROPOFOL N/A 12/03/2020   normal esophagus, multiple gastric polyps s/p biopsy, small  paraesophageal hernia, normal duodenum. Fundic gland polyps.   ESOPHAGOGASTRODUODENOSCOPY (EGD) WITH PROPOFOL N/A 02/05/2021   Procedure: ESOPHAGOGASTRODUODENOSCOPY (EGD) WITH PROPOFOL;  Surgeon: [name], MD;  [facility];  Service: Endoscopy;  Laterality: N/A;   ESOPHAGOGASTRODUODENOSCOPY (EGD) WITH PROPOFOL N/A 03/03/2021   Procedure: ESOPHAGOGASTRODUODENOSCOPY (EGD) WITH PROPOFOL;  Surgeon: [name], MD;  [facility];  Service: Gastroenterology;  Laterality: N/A;  with possible push enteroscopy utilizing the pediatric colonoscope   GIVENS CAPSULE STUDY N/A 12/16/2020   normal small bowel capsule but rapid transit time of 21 minutes. Recommended to resume alendronate.    GIVENS CAPSULE STUDY N/A 02/04/2021   Procedure: GIVENS CAPSULE STUDY;  Surgeon: Rogene Houston, MD;  [facility];  Service: Endoscopy;  Laterality: N/A;   HOT HEMOSTASIS  02/05/2021   Procedure: HOT HEMOSTASIS (ARGON PLASMA COAGULATION/BICAP);  Surgeon: [name], MD;  [facility];  Service: Endoscopy;;   HOT HEMOSTASIS  03/03/2021   Procedure: HOT HEMOSTASIS (ARGON PLASMA COAGULATION/BICAP);  Surgeon: [name], [name], MD;  [facility];  Service: Gastroenterology;;   TONSILLECTOMY     age 28    Family History: Family History  Problem Relation Age of Onset   Osteoporosis Mother    Other Father        head trauma   Breast cancer Sister        X's 2 sisters    Social History: Social History   Tobacco Use  Smoking Status Former   Packs/day: 0.50   Years: 20.00   Pack years: 10.00   Types: Cigarettes   Start date: 09/16/1964   Quit date: 10/05/1983   Years since quitting: 37.4  Smokeless Tobacco Never   Social History   Substance and Sexual Activity  Alcohol Use Never   Alcohol/week: 0.0 standard drinks   Social History   Substance and Sexual Activity  Drug Use Never    Allergies: Allergies  Allergen  Reactions   Aspirin     samter's syndrome   Codeine Itching   Sulfa Antibiotics     Unknown reaction   Other Rash    gel filled fentanyl patch, adhesive on that patch caused ithcing    Medications: Current Outpatient Medications  Medication Sig Dispense Refill   acetaminophen (TYLENOL) 500 MG tablet Take 500 mg by mouth every 6 (six) hours as needed for moderate pain or headache.     albuterol (ACCUNEB) 0.63 MG/3ML nebulizer solution Take 1 ampule by nebulization every 6 (six) hours as needed for wheezing.     ALPRAZolam (XANAX) 0.25 MG tablet Take 1 tablet (0.25 mg total) by mouth daily as needed for anxiety or sleep. 10 tablet 2   ferrous sulfate 325 (65 FE) MG tablet Take 1 tablet (325 mg total) by mouth daily with breakfast. 90 tablet 3   gabapentin (NEURONTIN) 300 MG capsule Take 1 capsule (300 mg total) by mouth 3 (three) times daily. 90 capsule    mirtazapine (REMERON) 7.5 MG tablet Take 7.5 mg by mouth  at bedtime.     montelukast (SINGULAIR) 10 MG tablet Take 10 mg by mouth at bedtime.     Multiple Vitamins-Minerals (PRESERVISION AREDS 2) CAPS Take 1 capsule by mouth in the morning and at bedtime.     ondansetron (ZOFRAN) 4 MG tablet Take 1 tablet (4 mg total) by mouth every 6 (six) hours as needed for nausea. 20 tablet 0   Oxycodone HCl 10 MG TABS Take 1 tablet (10 mg total) by mouth every 6 (six) hours as needed. 12 tablet 0   pantoprazole (PROTONIX) 40 MG tablet Take 1 tablet (40 mg total) by mouth 2 (two) times daily. 180 tablet 0   sertraline (ZOLOFT) 50 MG tablet Take 25 mg by mouth daily.     simvastatin (ZOCOR) 20 MG tablet Take 20 mg by mouth daily.     TRELEGY ELLIPTA 200-62.5-25 MCG/INH AEPB Inhale 1 puff into the lungs daily.     vitamin B-12 (CYANOCOBALAMIN) 500 MCG tablet Take 1 tablet (500 mcg total) by mouth daily.     No current facility-administered medications for this visit.    Review of Systems: GENERAL: negative for malaise, night sweats HEENT: No  changes in hearing or vision, no nose bleeds or other nasal problems. NECK: Negative for lumps, goiter, pain and significant neck swelling RESPIRATORY: Negative for cough, wheezing CARDIOVASCULAR: Negative for chest pain, leg swelling, palpitations, orthopnea GI: SEE HPI MUSCULOSKELETAL: Negative for joint pain or swelling, back pain, and muscle pain. SKIN: Negative for lesions, rash PSYCH: Negative for sleep disturbance, mood disorder and recent psychosocial stressors. HEMATOLOGY Negative for prolonged bleeding, bruising easily, and swollen nodes. ENDOCRINE: Negative for cold or heat intolerance, polyuria, polydipsia and goiter. NEURO: negative for tremor, gait imbalance, syncope and seizures. The remainder of the review of systems is noncontributory.   Physical Exam: BP 120/75 (BP Location: Left Arm, Patient Position: Sitting, Cuff Size: Large)   Pulse 94   Temp 99.1 F (37.3 C) (Oral)   Ht 4\' 10"  (1.473 m)   BMI 28.98 kg/m  GENERAL: The patient is AO x3, in no acute distress. Sitting in wheelchair. HEENT: Head is normocephalic and atraumatic. EOMI are intact. Mouth is well hydrated and without lesions. NECK: Supple. No masses LUNGS: Clear to auscultation. No presence of rhonchi/wheezing/rales. Adequate chest expansion HEART: RRR, normal s1 and s2. ABDOMEN: Soft, nontender, no guarding, no peritoneal signs, and nondistended. BS +. No masses. EXTREMITIES: Without any cyanosis, clubbing, rash, lesions or edema. NEUROLOGIC: AOx3, no focal motor deficit. SKIN: no jaundice, no rashes  Imaging/Labs: as above  I personally reviewed and interpreted the available labs, imaging and endoscopic files.  Impression and Plan: Alexa Nichols is a 75 y.o. female with past medical history of Alexa Nichols, recurrent iron deficiency anemia due to AVMs, COPD, asthma, depression, GERD, liver carcinoma of the breast, who presents for follow up after recent hospitalization for recurrent iron  deficiency anemia.  The patient had evidence of recurrent anemia due to presence of AVMs.  Previous capsule Endoscopies Have Not Shown Presence of AVMs in Her Small Bowel but She Had Recent EGDs That Showed These Lesions in the stomach and proximal duodenum which have been ablated.  She has not presented any episodes of overt gastrointestinal bleeding.  I consider she will benefit from continuing her oral iron, we will request the records of her most recent H&H but she will need to have this checked every week for the next couple months to determine if there is any significant drop in  her blood count that would warrant any other investigations.  If she responds to oral iron supplementation, no further work-up will be warranted.  She has developed significant constipation due to the iron supplementation.  She will benefit from starting MiraLAX intake but also a better position will benefit her for defecation, she needs to be moved to a commode by the rehab facility staff which she is very important to improve her symptoms.  Finally, I will refer her to neurology for follow-up of her GBS as there is no physician that is currently taking care of this disease.  - Continue oral iron - Start taking Miralax 1 capful every 12 hours. If after two weeks there is no improvement, increase to 1 capful every 8 hours - Patient to be moved to commode to have a bowel movement - Check H/H every week, we will request most recent lab results - Referral to neurology for GBS - RTC 2 months  All questions were answered.      [name], MD Gastroenterology and Hepatology [facility]

## 2021-04-15 DIAGNOSIS — M25562 Pain in left knee: Secondary | ICD-10-CM | POA: Diagnosis not present

## 2021-04-15 DIAGNOSIS — M79662 Pain in left lower leg: Secondary | ICD-10-CM | POA: Diagnosis not present

## 2021-04-15 DIAGNOSIS — M25572 Pain in left ankle and joints of left foot: Secondary | ICD-10-CM | POA: Diagnosis not present

## 2021-04-27 ENCOUNTER — Telehealth (INDEPENDENT_AMBULATORY_CARE_PROVIDER_SITE_OTHER): Payer: Self-pay | Admitting: Gastroenterology

## 2021-04-27 NOTE — Telephone Encounter (Signed)
I called the patient to inform about the results of recent blood testing which showed stable Hb of 11.2 (04/22/21), no episodes of melena or hematochezia.  I advised her to continue taking the iron and we will hold on performing any endoscopic procedures for now.  The patient understood and agreed.  [name], MD Gastroenterology and Hepatology [facility]

## 2021-04-30 ENCOUNTER — Telehealth (INDEPENDENT_AMBULATORY_CARE_PROVIDER_SITE_OTHER): Payer: Self-pay | Admitting: *Deleted

## 2021-04-30 NOTE — Telephone Encounter (Signed)
Dr. Jenetta Downer is getting weekly H&H on pt and would like to change order to to once a month. I called [facility] [phone] and spoke with nurse Theadora Rama and gave a verbal order to change from once a week to once a month.

## 2021-05-05 DIAGNOSIS — Z299 Encounter for prophylactic measures, unspecified: Secondary | ICD-10-CM | POA: Diagnosis not present

## 2021-05-05 DIAGNOSIS — M549 Dorsalgia, unspecified: Secondary | ICD-10-CM | POA: Diagnosis not present

## 2021-05-05 DIAGNOSIS — I700 Atherosclerosis of aorta: Secondary | ICD-10-CM | POA: Diagnosis not present

## 2021-05-07 ENCOUNTER — Encounter (INDEPENDENT_AMBULATORY_CARE_PROVIDER_SITE_OTHER): Payer: Self-pay

## 2021-05-12 ENCOUNTER — Other Ambulatory Visit: Payer: Self-pay

## 2021-05-12 ENCOUNTER — Encounter (INDEPENDENT_AMBULATORY_CARE_PROVIDER_SITE_OTHER): Payer: Self-pay | Admitting: Gastroenterology

## 2021-05-12 ENCOUNTER — Ambulatory Visit (INDEPENDENT_AMBULATORY_CARE_PROVIDER_SITE_OTHER): Payer: Medicare Other | Admitting: Gastroenterology

## 2021-05-12 VITALS — BP 117/73 | HR 93 | Temp 98.3°F | Ht <= 58 in | Wt 142.0 lb

## 2021-05-12 DIAGNOSIS — K5903 Drug induced constipation: Secondary | ICD-10-CM | POA: Diagnosis not present

## 2021-05-12 DIAGNOSIS — K5520 Angiodysplasia of colon without hemorrhage: Secondary | ICD-10-CM | POA: Diagnosis not present

## 2021-05-12 DIAGNOSIS — K31819 Angiodysplasia of stomach and duodenum without bleeding: Secondary | ICD-10-CM

## 2021-05-12 DIAGNOSIS — D649 Anemia, unspecified: Secondary | ICD-10-CM | POA: Diagnosis not present

## 2021-05-12 NOTE — Patient Instructions (Signed)
Continue Miralax daily Continue oral iron 1 tablet every day Check H/H every month

## 2021-05-12 NOTE — Progress Notes (Signed)
[name], M.D. Gastroenterology & Hepatology [facility] [address] [place], [place] [address]  Primary Care Physician: [name], MD [place] [id number]  I will communicate my assessment and recommendations to the referring MD via EMR.  Problems: Gastric and duodenal AVMs Iron deficiency anemia secondary to AVMs Iron induced constipation  History of Present Illness: Alexa Nichols is a 75 y.o. female with past medical history of [name], recurrent iron deficiency anemia due to AVMs, COPD, asthma, depression, GERD, liver carcinoma of the breast, who presents for follow up of IDA  The patient was last seen on 03/12/2021. At that time, the patient was advised to continue taking oral iron every day and she was advised to start taking MiraLAX to have more frequent bowel movements.  Had her H&H checked every week and had been stable with most recent value of 11.2 on 04/29/2021.  Due to this, I decided to space out her blood work-up every month.    The patient states that she has felt fine.  Has had very occasional episodes of discomfort in her epigastric area but very mild and this has been going on for multiple years but nothing concerning for her. Denies having any other concerns. Is moving her bowels every day, takes Miralax once a day. The patient denies having any nausea, vomiting, fever, chills, hematochezia, melena, hematemesis, abdominal distention,  diarrhea, jaundice, pruritus or weight loss.  Will see the neurologist on 9/13 for her GBS. Has been recovering slowly from her GBS at [facility] rehab facility.  Last EGD: push enteroscopy on 03/03/2021, was found to have 2 AVMs in the gastric body with largest size of 8 mm which were ablated with APC.  There were also multiple polyps without presence of any inflammatory changes or recent bleeding.  The duodenum and proximal jejunum, as well as the esophagus were within  normal limits.   Last Colonoscopy: 12/03/2020, 2 polyps resected from the cecum 3 polyps resected from the cecum with largest size of 7 mm, there was a polyp of 5 mm in the hepatic flexure, diverticulosis and external hemorrhoids.  Pathology of the polyps was consistent with a tubular adenoma.  She was recommended to have a repeat colonoscopy in 5 years  Past Medical History: Past Medical History:  Diagnosis Date   Asthma    Chronic low back pain    COPD (chronic obstructive pulmonary disease) (HCC)    Depression    GERD (gastroesophageal reflux disease)    Guillain Barr syndrome (HCC)    Heart murmur    Lobular carcinoma of left breast ([place]) 1997   in situ   Other and unspecified hyperlipidemia    Seasonal allergies     Past Surgical History: Past Surgical History:  Procedure Laterality Date   BIOPSY  12/03/2020   Procedure: BIOPSY;  Surgeon: Rogene Houston, MD;  [facility];  Service: Endoscopy;;  gastric polyps biopsies;   BREAST SURGERY Left    lumpectomy    CESAREAN SECTION  1986   CHOLECYSTECTOMY     COLONOSCOPY WITH PROPOFOL N/A 12/03/2020    two 4-7 mm polyps in cecum, one small polyp in cecum, one 5 mm polyp, sigmoid diverticulosis, path with tubular adenomas   ESOPHAGOGASTRODUODENOSCOPY (EGD) WITH PROPOFOL N/A 12/03/2020   normal esophagus, multiple gastric polyps s/p biopsy, small paraesophageal hernia, normal duodenum. Fundic gland polyps.   ESOPHAGOGASTRODUODENOSCOPY (EGD) WITH PROPOFOL N/A 02/05/2021   Procedure: ESOPHAGOGASTRODUODENOSCOPY (EGD)  WITH PROPOFOL;  Surgeon: [name], MD;  [facility];  Service: Endoscopy;  Laterality: N/A;   ESOPHAGOGASTRODUODENOSCOPY (EGD) WITH PROPOFOL N/A 03/03/2021   Procedure: ESOPHAGOGASTRODUODENOSCOPY (EGD) WITH PROPOFOL;  Surgeon: [name], MD;  [facility];  Service: Gastroenterology;  Laterality: N/A;  with possible push enteroscopy utilizing the pediatric colonoscope   GIVENS  CAPSULE STUDY N/A 12/16/2020   normal small bowel capsule but rapid transit time of 21 minutes. Recommended to resume alendronate.    GIVENS CAPSULE STUDY N/A 02/04/2021   Procedure: GIVENS CAPSULE STUDY;  Surgeon: Rogene Houston, MD;  [facility];  Service: Endoscopy;  Laterality: N/A;   HOT HEMOSTASIS  02/05/2021   Procedure: HOT HEMOSTASIS (ARGON PLASMA COAGULATION/BICAP);  Surgeon: [name], MD;  [facility];  Service: Endoscopy;;   HOT HEMOSTASIS  03/03/2021   Procedure: HOT HEMOSTASIS (ARGON PLASMA COAGULATION/BICAP);  Surgeon: [name], [name], MD;  [facility];  Service: Gastroenterology;;   TONSILLECTOMY     age 37    Family History: Family History  Problem Relation Age of Onset   Osteoporosis Mother    Other Father        head trauma   Breast cancer Sister        X's 2 sisters    Social History: Social History   Tobacco Use  Smoking Status Former   Packs/day: 0.50   Years: 20.00   Pack years: 10.00   Types: Cigarettes   Start date: 09/16/1964   Quit date: 10/05/1983   Years since quitting: 37.6  Smokeless Tobacco Never   Social History   Substance and Sexual Activity  Alcohol Use Never   Alcohol/week: 0.0 standard drinks   Social History   Substance and Sexual Activity  Drug Use Never    Allergies: Allergies  Allergen Reactions   Aspirin     samter's syndrome   Codeine Itching   Sulfa Antibiotics     Unknown reaction   Other Rash    gel filled fentanyl patch, adhesive on that patch caused ithcing    Medications: Current Outpatient Medications  Medication Sig Dispense Refill   acetaminophen (TYLENOL) 500 MG tablet Take 500 mg by mouth every 6 (six) hours as needed for moderate pain or headache.     albuterol (ACCUNEB) 0.63 MG/3ML nebulizer solution Take 1 ampule by nebulization every 6 (six) hours as needed for wheezing.     ALPRAZolam (XANAX) 0.25 MG tablet Take 1 tablet (0.25 mg total) by mouth  daily as needed for anxiety or sleep. 10 tablet 2   ferrous sulfate 325 (65 FE) MG tablet Take 1 tablet (325 mg total) by mouth daily with breakfast. 90 tablet 3   gabapentin (NEURONTIN) 300 MG capsule Take 1 capsule (300 mg total) by mouth 3 (three) times daily. 90 capsule    mirtazapine (REMERON) 7.5 MG tablet Take 7.5 mg by mouth at bedtime.     montelukast (SINGULAIR) 10 MG tablet Take 10 mg by mouth at bedtime.     Multiple Vitamins-Minerals (PRESERVISION AREDS 2) CAPS Take 1 capsule by mouth in the morning and at bedtime.     ondansetron (ZOFRAN) 4 MG tablet Take 1 tablet (4 mg total) by mouth every 6 (six) hours as needed for nausea. 20 tablet 0   Oxycodone HCl 10 MG TABS Take 1 tablet (10 mg total) by mouth every 6 (six) hours as needed. 12 tablet 0   pantoprazole (PROTONIX) 40  MG tablet Take 1 tablet (40 mg total) by mouth 2 (two) times daily. 180 tablet 0   Polyethylene Glycol 3350 (MIRALAX PO) Take by mouth. Bid     sertraline (ZOLOFT) 50 MG tablet Take 25 mg by mouth daily.     simvastatin (ZOCOR) 20 MG tablet Take 20 mg by mouth daily.     tiZANidine (ZANAFLEX) 4 MG capsule Take 4 mg by mouth 3 (three) times daily as needed for muscle spasms.     TRELEGY ELLIPTA 200-62.5-25 MCG/INH AEPB Inhale 1 puff into the lungs daily.     vitamin B-12 (CYANOCOBALAMIN) 500 MCG tablet Take 1 tablet (500 mcg total) by mouth daily.     No current facility-administered medications for this visit.    Review of Systems: GENERAL: negative for malaise, night sweats HEENT: No changes in hearing or vision, no nose bleeds or other nasal problems. NECK: Negative for lumps, goiter, pain and significant neck swelling RESPIRATORY: Negative for cough, wheezing CARDIOVASCULAR: Negative for chest pain, leg swelling, palpitations, orthopnea GI: SEE HPI MUSCULOSKELETAL: Negative for joint pain or swelling, back pain, and muscle pain. SKIN: Negative for lesions, rash PSYCH: Negative for sleep disturbance, mood  disorder and recent psychosocial stressors. HEMATOLOGY Negative for prolonged bleeding, bruising easily, and swollen nodes. ENDOCRINE: Negative for cold or heat intolerance, polyuria, polydipsia and goiter. NEURO: negative for tremor, gait imbalance, syncope and seizures. The remainder of the review of systems is noncontributory.   Physical Exam: BP 117/73 (BP Location: Left Arm, Patient Position: Sitting, Cuff Size: Large)   Pulse 93   Temp 98.3 F (36.8 C) (Oral)   Ht '4\' 10"'$  (1.473 m)   Wt 142 lb (64.4 kg) Comment: per pt. in wheelchair today  BMI 29.68 kg/m  GENERAL: The patient is AO x3, in no acute distress. Sitting in wheelchair. HEENT: Head is normocephalic and atraumatic. EOMI are intact. Mouth is well hydrated and without lesions. NECK: Supple. No masses LUNGS: Clear to auscultation. No presence of rhonchi/wheezing/rales. Adequate chest expansion HEART: RRR, normal s1 and s2. ABDOMEN: Soft, nontender, no guarding, no peritoneal signs, and nondistended. BS +. No masses. EXTREMITIES: Without any cyanosis, clubbing, rash, lesions or edema. NEUROLOGIC: AOx3, no focal motor deficit. SKIN: no jaundice, no rashes  Imaging/Labs: as above  I personally reviewed and interpreted the available labs, imaging and endoscopic files.  Impression and Plan: Alexa Nichols is a 75 y.o. female with past medical history of [name], recurrent iron deficiency anemia due to AVMs, COPD, asthma, depression, GERD, liver carcinoma of the breast, who presents for follow up of IDA.  The patient has presented clinical stability and has not presented any overt gastrointestinal bleeding.  Importantly, she has presented stable hemoglobin in repeat checkup every week.  It seems that she is responding to her oral iron medication and has not present any recurrent episodes of bleeding.  Due to this, we will continue with her oral iron supplementation daily and checking her H&H every month.  May consider  spacing it out to every 6 months if no worsening anemia.  On the other hand, she has responded to the use of MiraLAX for her iron induced constipation, she should keep taking the medication on a daily basis.  -Continue Miralax daily -Continue ferrous sulfate 325 mg tablet every day -Check H/H every month  All questions were answered.      [name], MD Gastroenterology and Hepatology [facility]

## 2021-06-02 DIAGNOSIS — I10 Essential (primary) hypertension: Secondary | ICD-10-CM | POA: Diagnosis not present

## 2021-06-02 DIAGNOSIS — G610 Guillain-Barre syndrome: Secondary | ICD-10-CM | POA: Diagnosis not present

## 2021-06-02 DIAGNOSIS — Z299 Encounter for prophylactic measures, unspecified: Secondary | ICD-10-CM | POA: Diagnosis not present

## 2021-06-04 DIAGNOSIS — G610 Guillain-Barre syndrome: Secondary | ICD-10-CM | POA: Diagnosis not present

## 2021-06-04 DIAGNOSIS — I10 Essential (primary) hypertension: Secondary | ICD-10-CM | POA: Diagnosis not present

## 2021-06-04 DIAGNOSIS — Z299 Encounter for prophylactic measures, unspecified: Secondary | ICD-10-CM | POA: Diagnosis not present

## 2021-06-04 DIAGNOSIS — J449 Chronic obstructive pulmonary disease, unspecified: Secondary | ICD-10-CM | POA: Diagnosis not present

## 2021-06-04 DIAGNOSIS — F419 Anxiety disorder, unspecified: Secondary | ICD-10-CM | POA: Diagnosis not present

## 2021-06-08 DIAGNOSIS — J449 Chronic obstructive pulmonary disease, unspecified: Secondary | ICD-10-CM | POA: Diagnosis not present

## 2021-06-08 DIAGNOSIS — I700 Atherosclerosis of aorta: Secondary | ICD-10-CM | POA: Diagnosis not present

## 2021-06-08 DIAGNOSIS — G610 Guillain-Barre syndrome: Secondary | ICD-10-CM | POA: Diagnosis not present

## 2021-06-08 DIAGNOSIS — I10 Essential (primary) hypertension: Secondary | ICD-10-CM | POA: Diagnosis not present

## 2021-06-08 DIAGNOSIS — F419 Anxiety disorder, unspecified: Secondary | ICD-10-CM | POA: Diagnosis not present

## 2021-06-08 DIAGNOSIS — Z9181 History of falling: Secondary | ICD-10-CM | POA: Diagnosis not present

## 2021-06-08 DIAGNOSIS — Z87891 Personal history of nicotine dependence: Secondary | ICD-10-CM | POA: Diagnosis not present

## 2021-06-09 DIAGNOSIS — G610 Guillain-Barre syndrome: Secondary | ICD-10-CM | POA: Diagnosis not present

## 2021-06-09 DIAGNOSIS — Z87891 Personal history of nicotine dependence: Secondary | ICD-10-CM | POA: Diagnosis not present

## 2021-06-09 DIAGNOSIS — F419 Anxiety disorder, unspecified: Secondary | ICD-10-CM | POA: Diagnosis not present

## 2021-06-09 DIAGNOSIS — J449 Chronic obstructive pulmonary disease, unspecified: Secondary | ICD-10-CM | POA: Diagnosis not present

## 2021-06-09 DIAGNOSIS — I700 Atherosclerosis of aorta: Secondary | ICD-10-CM | POA: Diagnosis not present

## 2021-06-09 DIAGNOSIS — I10 Essential (primary) hypertension: Secondary | ICD-10-CM | POA: Diagnosis not present

## 2021-06-11 DIAGNOSIS — F419 Anxiety disorder, unspecified: Secondary | ICD-10-CM | POA: Diagnosis not present

## 2021-06-11 DIAGNOSIS — I10 Essential (primary) hypertension: Secondary | ICD-10-CM | POA: Diagnosis not present

## 2021-06-11 DIAGNOSIS — Z87891 Personal history of nicotine dependence: Secondary | ICD-10-CM | POA: Diagnosis not present

## 2021-06-11 DIAGNOSIS — G610 Guillain-Barre syndrome: Secondary | ICD-10-CM | POA: Diagnosis not present

## 2021-06-11 DIAGNOSIS — I700 Atherosclerosis of aorta: Secondary | ICD-10-CM | POA: Diagnosis not present

## 2021-06-11 DIAGNOSIS — J449 Chronic obstructive pulmonary disease, unspecified: Secondary | ICD-10-CM | POA: Diagnosis not present

## 2021-06-12 DIAGNOSIS — Z299 Encounter for prophylactic measures, unspecified: Secondary | ICD-10-CM | POA: Diagnosis not present

## 2021-06-12 DIAGNOSIS — I10 Essential (primary) hypertension: Secondary | ICD-10-CM | POA: Diagnosis not present

## 2021-06-12 DIAGNOSIS — G610 Guillain-Barre syndrome: Secondary | ICD-10-CM | POA: Diagnosis not present

## 2021-06-12 DIAGNOSIS — G8220 Paraplegia, unspecified: Secondary | ICD-10-CM | POA: Diagnosis not present

## 2021-06-12 DIAGNOSIS — F339 Major depressive disorder, recurrent, unspecified: Secondary | ICD-10-CM | POA: Diagnosis not present

## 2021-06-15 DIAGNOSIS — G610 Guillain-Barre syndrome: Secondary | ICD-10-CM | POA: Diagnosis not present

## 2021-06-15 DIAGNOSIS — F419 Anxiety disorder, unspecified: Secondary | ICD-10-CM | POA: Diagnosis not present

## 2021-06-15 DIAGNOSIS — I10 Essential (primary) hypertension: Secondary | ICD-10-CM | POA: Diagnosis not present

## 2021-06-15 DIAGNOSIS — J449 Chronic obstructive pulmonary disease, unspecified: Secondary | ICD-10-CM | POA: Diagnosis not present

## 2021-06-15 DIAGNOSIS — I700 Atherosclerosis of aorta: Secondary | ICD-10-CM | POA: Diagnosis not present

## 2021-06-15 DIAGNOSIS — Z87891 Personal history of nicotine dependence: Secondary | ICD-10-CM | POA: Diagnosis not present

## 2021-06-16 ENCOUNTER — Ambulatory Visit: Payer: Medicare Other | Admitting: Neurology

## 2021-06-18 DIAGNOSIS — Z87891 Personal history of nicotine dependence: Secondary | ICD-10-CM | POA: Diagnosis not present

## 2021-06-18 DIAGNOSIS — G610 Guillain-Barre syndrome: Secondary | ICD-10-CM | POA: Diagnosis not present

## 2021-06-18 DIAGNOSIS — J449 Chronic obstructive pulmonary disease, unspecified: Secondary | ICD-10-CM | POA: Diagnosis not present

## 2021-06-18 DIAGNOSIS — F419 Anxiety disorder, unspecified: Secondary | ICD-10-CM | POA: Diagnosis not present

## 2021-06-18 DIAGNOSIS — I10 Essential (primary) hypertension: Secondary | ICD-10-CM | POA: Diagnosis not present

## 2021-06-18 DIAGNOSIS — I700 Atherosclerosis of aorta: Secondary | ICD-10-CM | POA: Diagnosis not present

## 2021-06-19 DIAGNOSIS — Z87891 Personal history of nicotine dependence: Secondary | ICD-10-CM | POA: Diagnosis not present

## 2021-06-19 DIAGNOSIS — G610 Guillain-Barre syndrome: Secondary | ICD-10-CM | POA: Diagnosis not present

## 2021-06-19 DIAGNOSIS — I700 Atherosclerosis of aorta: Secondary | ICD-10-CM | POA: Diagnosis not present

## 2021-06-19 DIAGNOSIS — J449 Chronic obstructive pulmonary disease, unspecified: Secondary | ICD-10-CM | POA: Diagnosis not present

## 2021-06-19 DIAGNOSIS — I10 Essential (primary) hypertension: Secondary | ICD-10-CM | POA: Diagnosis not present

## 2021-06-19 DIAGNOSIS — F419 Anxiety disorder, unspecified: Secondary | ICD-10-CM | POA: Diagnosis not present

## 2021-06-23 DIAGNOSIS — I10 Essential (primary) hypertension: Secondary | ICD-10-CM | POA: Diagnosis not present

## 2021-06-23 DIAGNOSIS — I700 Atherosclerosis of aorta: Secondary | ICD-10-CM | POA: Diagnosis not present

## 2021-06-23 DIAGNOSIS — F419 Anxiety disorder, unspecified: Secondary | ICD-10-CM | POA: Diagnosis not present

## 2021-06-23 DIAGNOSIS — J449 Chronic obstructive pulmonary disease, unspecified: Secondary | ICD-10-CM | POA: Diagnosis not present

## 2021-06-23 DIAGNOSIS — G610 Guillain-Barre syndrome: Secondary | ICD-10-CM | POA: Diagnosis not present

## 2021-06-23 DIAGNOSIS — Z87891 Personal history of nicotine dependence: Secondary | ICD-10-CM | POA: Diagnosis not present

## 2021-06-24 DIAGNOSIS — J449 Chronic obstructive pulmonary disease, unspecified: Secondary | ICD-10-CM | POA: Diagnosis not present

## 2021-06-24 DIAGNOSIS — I700 Atherosclerosis of aorta: Secondary | ICD-10-CM | POA: Diagnosis not present

## 2021-06-24 DIAGNOSIS — G610 Guillain-Barre syndrome: Secondary | ICD-10-CM | POA: Diagnosis not present

## 2021-06-24 DIAGNOSIS — F419 Anxiety disorder, unspecified: Secondary | ICD-10-CM | POA: Diagnosis not present

## 2021-06-24 DIAGNOSIS — Z87891 Personal history of nicotine dependence: Secondary | ICD-10-CM | POA: Diagnosis not present

## 2021-06-24 DIAGNOSIS — I10 Essential (primary) hypertension: Secondary | ICD-10-CM | POA: Diagnosis not present

## 2021-06-25 DIAGNOSIS — Z87891 Personal history of nicotine dependence: Secondary | ICD-10-CM | POA: Diagnosis not present

## 2021-06-25 DIAGNOSIS — I10 Essential (primary) hypertension: Secondary | ICD-10-CM | POA: Diagnosis not present

## 2021-06-25 DIAGNOSIS — F419 Anxiety disorder, unspecified: Secondary | ICD-10-CM | POA: Diagnosis not present

## 2021-06-25 DIAGNOSIS — J449 Chronic obstructive pulmonary disease, unspecified: Secondary | ICD-10-CM | POA: Diagnosis not present

## 2021-06-25 DIAGNOSIS — G610 Guillain-Barre syndrome: Secondary | ICD-10-CM | POA: Diagnosis not present

## 2021-06-25 DIAGNOSIS — I700 Atherosclerosis of aorta: Secondary | ICD-10-CM | POA: Diagnosis not present

## 2021-07-01 DIAGNOSIS — J449 Chronic obstructive pulmonary disease, unspecified: Secondary | ICD-10-CM | POA: Diagnosis not present

## 2021-07-01 DIAGNOSIS — I10 Essential (primary) hypertension: Secondary | ICD-10-CM | POA: Diagnosis not present

## 2021-07-01 DIAGNOSIS — F419 Anxiety disorder, unspecified: Secondary | ICD-10-CM | POA: Diagnosis not present

## 2021-07-01 DIAGNOSIS — Z87891 Personal history of nicotine dependence: Secondary | ICD-10-CM | POA: Diagnosis not present

## 2021-07-01 DIAGNOSIS — G610 Guillain-Barre syndrome: Secondary | ICD-10-CM | POA: Diagnosis not present

## 2021-07-01 DIAGNOSIS — I700 Atherosclerosis of aorta: Secondary | ICD-10-CM | POA: Diagnosis not present

## 2021-07-03 DIAGNOSIS — Z87891 Personal history of nicotine dependence: Secondary | ICD-10-CM | POA: Diagnosis not present

## 2021-07-03 DIAGNOSIS — G610 Guillain-Barre syndrome: Secondary | ICD-10-CM | POA: Diagnosis not present

## 2021-07-03 DIAGNOSIS — I700 Atherosclerosis of aorta: Secondary | ICD-10-CM | POA: Diagnosis not present

## 2021-07-03 DIAGNOSIS — F419 Anxiety disorder, unspecified: Secondary | ICD-10-CM | POA: Diagnosis not present

## 2021-07-03 DIAGNOSIS — I10 Essential (primary) hypertension: Secondary | ICD-10-CM | POA: Diagnosis not present

## 2021-07-03 DIAGNOSIS — J449 Chronic obstructive pulmonary disease, unspecified: Secondary | ICD-10-CM | POA: Diagnosis not present

## 2021-07-06 DIAGNOSIS — Z87891 Personal history of nicotine dependence: Secondary | ICD-10-CM | POA: Diagnosis not present

## 2021-07-06 DIAGNOSIS — J449 Chronic obstructive pulmonary disease, unspecified: Secondary | ICD-10-CM | POA: Diagnosis not present

## 2021-07-06 DIAGNOSIS — F419 Anxiety disorder, unspecified: Secondary | ICD-10-CM | POA: Diagnosis not present

## 2021-07-06 DIAGNOSIS — I700 Atherosclerosis of aorta: Secondary | ICD-10-CM | POA: Diagnosis not present

## 2021-07-06 DIAGNOSIS — I10 Essential (primary) hypertension: Secondary | ICD-10-CM | POA: Diagnosis not present

## 2021-07-06 DIAGNOSIS — G610 Guillain-Barre syndrome: Secondary | ICD-10-CM | POA: Diagnosis not present

## 2021-07-08 DIAGNOSIS — Z9181 History of falling: Secondary | ICD-10-CM | POA: Diagnosis not present

## 2021-07-08 DIAGNOSIS — Z87891 Personal history of nicotine dependence: Secondary | ICD-10-CM | POA: Diagnosis not present

## 2021-07-08 DIAGNOSIS — G610 Guillain-Barre syndrome: Secondary | ICD-10-CM | POA: Diagnosis not present

## 2021-07-08 DIAGNOSIS — F419 Anxiety disorder, unspecified: Secondary | ICD-10-CM | POA: Diagnosis not present

## 2021-07-08 DIAGNOSIS — J449 Chronic obstructive pulmonary disease, unspecified: Secondary | ICD-10-CM | POA: Diagnosis not present

## 2021-07-08 DIAGNOSIS — I10 Essential (primary) hypertension: Secondary | ICD-10-CM | POA: Diagnosis not present

## 2021-07-08 DIAGNOSIS — I700 Atherosclerosis of aorta: Secondary | ICD-10-CM | POA: Diagnosis not present

## 2021-07-15 DIAGNOSIS — J449 Chronic obstructive pulmonary disease, unspecified: Secondary | ICD-10-CM | POA: Diagnosis not present

## 2021-07-15 DIAGNOSIS — Z87891 Personal history of nicotine dependence: Secondary | ICD-10-CM | POA: Diagnosis not present

## 2021-07-15 DIAGNOSIS — F419 Anxiety disorder, unspecified: Secondary | ICD-10-CM | POA: Diagnosis not present

## 2021-07-15 DIAGNOSIS — G610 Guillain-Barre syndrome: Secondary | ICD-10-CM | POA: Diagnosis not present

## 2021-07-15 DIAGNOSIS — I700 Atherosclerosis of aorta: Secondary | ICD-10-CM | POA: Diagnosis not present

## 2021-07-15 DIAGNOSIS — I10 Essential (primary) hypertension: Secondary | ICD-10-CM | POA: Diagnosis not present

## 2021-07-22 DIAGNOSIS — I10 Essential (primary) hypertension: Secondary | ICD-10-CM | POA: Diagnosis not present

## 2021-07-22 DIAGNOSIS — I700 Atherosclerosis of aorta: Secondary | ICD-10-CM | POA: Diagnosis not present

## 2021-07-22 DIAGNOSIS — F419 Anxiety disorder, unspecified: Secondary | ICD-10-CM | POA: Diagnosis not present

## 2021-07-22 DIAGNOSIS — G610 Guillain-Barre syndrome: Secondary | ICD-10-CM | POA: Diagnosis not present

## 2021-07-22 DIAGNOSIS — Z87891 Personal history of nicotine dependence: Secondary | ICD-10-CM | POA: Diagnosis not present

## 2021-07-22 DIAGNOSIS — J449 Chronic obstructive pulmonary disease, unspecified: Secondary | ICD-10-CM | POA: Diagnosis not present

## 2021-07-27 ENCOUNTER — Other Ambulatory Visit: Payer: Self-pay

## 2021-07-27 ENCOUNTER — Encounter (INDEPENDENT_AMBULATORY_CARE_PROVIDER_SITE_OTHER): Payer: Self-pay | Admitting: Ophthalmology

## 2021-07-27 ENCOUNTER — Ambulatory Visit (INDEPENDENT_AMBULATORY_CARE_PROVIDER_SITE_OTHER): Payer: Medicare Other | Admitting: Ophthalmology

## 2021-07-27 DIAGNOSIS — H353122 Nonexudative age-related macular degeneration, left eye, intermediate dry stage: Secondary | ICD-10-CM

## 2021-07-27 DIAGNOSIS — H353211 Exudative age-related macular degeneration, right eye, with active choroidal neovascularization: Secondary | ICD-10-CM | POA: Diagnosis not present

## 2021-07-27 NOTE — Assessment & Plan Note (Signed)
Wet AMD subfoveal OD with encroachment and impact on acuity.  Slightly to moderately worse with active subretinal fluid In addition to intraretinal fluid and subfoveal scarring.  Now 8 months after last injection of Eylea.  We will need to restart intravitreal antivegF in the near future, plan in 1 week.

## 2021-07-27 NOTE — Assessment & Plan Note (Signed)
OS stable no sign of CNVM 

## 2021-07-27 NOTE — Progress Notes (Signed)
07/27/2021     CHIEF COMPLAINT Patient presents for  Chief Complaint  Patient presents with   Retina Follow Up      HISTORY OF PRESENT ILLNESS: Alexa Nichols is a 75 y.o. female who presents to the clinic today for:   HPI     Retina Follow Up   Patient presents with  Wet AMD.  In both eyes.  This started 8 months ago.  Duration of 8 months.  Since onset it is gradually worsening.        Comments   8 month f/u OU with OCT, was previously receiving Eylea injections in the right eye in March of this year.       Last edited by [name], COA on 07/27/2021  1:44 PM.      Referring physician: [name], MD [place],  [place] [address]  HISTORICAL INFORMATION:   Selected notes from the MEDICAL RECORD NUMBER    Lab Results  Component Value Date   HGBA1C 4.6 (L) 02/17/2021     CURRENT MEDICATIONS: No current outpatient medications on file. (Ophthalmic Drugs)   No current facility-administered medications for this visit. (Ophthalmic Drugs)   Current Outpatient Medications (Other)  Medication Sig   acetaminophen (TYLENOL) 500 MG tablet Take 500 mg by mouth every 6 (six) hours as needed for moderate pain or headache.   albuterol (ACCUNEB) 0.63 MG/3ML nebulizer solution Take 1 ampule by nebulization every 6 (six) hours as needed for wheezing.   ALPRAZolam (XANAX) 0.25 MG tablet Take 1 tablet (0.25 mg total) by mouth daily as needed for anxiety or sleep.   ferrous sulfate 325 (65 FE) MG tablet Take 1 tablet (325 mg total) by mouth daily with breakfast.   gabapentin (NEURONTIN) 300 MG capsule Take 1 capsule (300 mg total) by mouth 3 (three) times daily.   mirtazapine (REMERON) 7.5 MG tablet Take 7.5 mg by mouth at bedtime.   montelukast (SINGULAIR) 10 MG tablet Take 10 mg by mouth at bedtime.   Multiple Vitamins-Minerals (PRESERVISION AREDS 2) CAPS Take 1 capsule by mouth in the morning and at bedtime.   ondansetron (ZOFRAN) 4 MG tablet Take 1 tablet  (4 mg total) by mouth every 6 (six) hours as needed for nausea.   Oxycodone HCl 10 MG TABS Take 1 tablet (10 mg total) by mouth every 6 (six) hours as needed.   pantoprazole (PROTONIX) 40 MG tablet Take 1 tablet (40 mg total) by mouth 2 (two) times daily.   Polyethylene Glycol 3350 (MIRALAX PO) Take by mouth. Bid   sertraline (ZOLOFT) 50 MG tablet Take 25 mg by mouth daily.   simvastatin (ZOCOR) 20 MG tablet Take 20 mg by mouth daily.   tiZANidine (ZANAFLEX) 4 MG capsule Take 4 mg by mouth 3 (three) times daily as needed for muscle spasms.   TRELEGY ELLIPTA 200-62.5-25 MCG/INH AEPB Inhale 1 puff into the lungs daily.   vitamin B-12 (CYANOCOBALAMIN) 500 MCG tablet Take 1 tablet (500 mcg total) by mouth daily.   No current facility-administered medications for this visit. (Other)      REVIEW OF SYSTEMS:    ALLERGIES Allergies  Allergen Reactions   Aspirin     samter's syndrome   Codeine Itching   Sulfa Antibiotics     Unknown reaction   Other Rash    gel filled fentanyl patch, adhesive on that patch caused ithcing    PAST MEDICAL HISTORY Past Medical History:  Diagnosis Date   Asthma  Chronic low back pain    COPD (chronic obstructive pulmonary disease) (HCC)    Depression    GERD (gastroesophageal reflux disease)    Guillain Barr syndrome (HCC)    Heart murmur    Lobular carcinoma of left breast ([place]) 1997   in situ   Other and unspecified hyperlipidemia    Seasonal allergies    Past Surgical History:  Procedure Laterality Date   BIOPSY  12/03/2020   Procedure: BIOPSY;  Surgeon: Rogene Houston, MD;  [facility];  Service: Endoscopy;;  gastric polyps biopsies;   BREAST SURGERY Left    lumpectomy    [place]     COLONOSCOPY WITH PROPOFOL N/A 12/03/2020    two 4-7 mm polyps in cecum, one small polyp in cecum, one 5 mm polyp, sigmoid diverticulosis, path with tubular adenomas   ESOPHAGOGASTRODUODENOSCOPY (EGD) WITH  PROPOFOL N/A 12/03/2020   normal esophagus, multiple gastric polyps s/p biopsy, small paraesophageal hernia, normal duodenum. Fundic gland polyps.   ESOPHAGOGASTRODUODENOSCOPY (EGD) WITH PROPOFOL N/A 02/05/2021   Procedure: ESOPHAGOGASTRODUODENOSCOPY (EGD) WITH PROPOFOL;  Surgeon: [name], MD;  [facility];  Service: Endoscopy;  Laterality: N/A;   ESOPHAGOGASTRODUODENOSCOPY (EGD) WITH PROPOFOL N/A 03/03/2021   Procedure: ESOPHAGOGASTRODUODENOSCOPY (EGD) WITH PROPOFOL;  Surgeon: [name], MD;  [facility];  Service: Gastroenterology;  Laterality: N/A;  with possible push enteroscopy utilizing the pediatric colonoscope   GIVENS CAPSULE STUDY N/A 12/16/2020   normal small bowel capsule but rapid transit time of 21 minutes. Recommended to resume alendronate.    GIVENS CAPSULE STUDY N/A 02/04/2021   Procedure: GIVENS CAPSULE STUDY;  Surgeon: Rogene Houston, MD;  [facility];  Service: Endoscopy;  Laterality: N/A;   HOT HEMOSTASIS  02/05/2021   Procedure: HOT HEMOSTASIS (ARGON PLASMA COAGULATION/BICAP);  Surgeon: [name], MD;  [facility];  Service: Endoscopy;;   HOT HEMOSTASIS  03/03/2021   Procedure: HOT HEMOSTASIS (ARGON PLASMA COAGULATION/BICAP);  Surgeon: [name], [name], MD;  [facility];  Service: Gastroenterology;;   TONSILLECTOMY     age 69    FAMILY HISTORY Family History  Problem Relation Age of Onset   Osteoporosis Mother    Other Father        head trauma   Breast cancer Sister        X's 2 sisters    SOCIAL HISTORY Social History   Tobacco Use   Smoking status: Former    Packs/day: 0.50    Years: 20.00    Pack years: 10.00    Types: Cigarettes    Start date: 09/16/1964    Quit date: 10/05/1983    Years since quitting: 37.8   Smokeless tobacco: Never  Vaping Use   Vaping Use: Never used  Substance Use Topics   Alcohol use: Never    Alcohol/week: 0.0 standard drinks   Drug  use: Never         OPHTHALMIC EXAM:  Base Eye Exam     Visual Acuity (ETDRS)       Right Left   Dist cc 20/250ecc 20/20 -1   Dist ph cc NI     Correction: Glasses         Tonometry (Tonopen, 1:50 PM)       Right Left   Pressure 13 10         Pupils       Dark Light Shape React APD  Right 5 4 Round Brisk None   Left 4 3 Round Brisk None         Visual Fields       Left Right    Full    Restrictions  Central scotoma         Extraocular Movement       Right Left    Full, Ortho Full, Ortho         Neuro/Psych     Oriented x3: Yes   Mood/Affect: Normal         Dilation     Both eyes: 1.0% Mydriacyl, 2.5% Phenylephrine @ 1:50 PM           Slit Lamp and Fundus Exam     External Exam       Right Left   External Normal Normal         Slit Lamp Exam       Right Left   Lids/Lashes Normal Normal   Conjunctiva/Sclera White and quiet White and quiet   Cornea Clear Clear   Anterior Chamber Deep and quiet Deep and quiet   Iris Round and reactive Round and reactive   Lens Posterior chamber intraocular lens Posterior chamber intraocular lens   Anterior Vitreous Normal Normal         Fundus Exam       Right Left   Posterior Vitreous Clear vitrectomized Normal   Disc Normal Normal   C/D Ratio 0.2 0.2   Macula Macular thickening, less Subretinal hemorrhage, Hard drusen, Retinal pigment epithelial rip.  And sub-RPE heme still persists, Retinal pigment epithelial detachment, no hemorrhage, Intermediate age related macular degeneration Early age related macular degeneration, no macular thickening, no microaneurysms, Hard drusen   Vessels Normal Normal   Periphery Normal Normal            IMAGING AND PROCEDURES  Imaging and Procedures for 07/27/21  OCT, Retina - OU - Both Eyes       Right Eye Quality was good. Scan locations included subfoveal. Central Foveal Thickness: 419. Progression has improved. Findings include  abnormal foveal contour, outer retinal tubulation, subretinal hyper-reflective material, disciform scar, choroidal neovascular membrane, no IRF.   Left Eye Quality was good. Scan locations included subfoveal. Central Foveal Thickness: 306. Progression has been stable. Findings include retinal drusen , normal foveal contour, no IRF, no SRF.   Notes Much less active CNVM subfoveal OD, much less thickening, on Eylea currently at currently at 48-month follow-up as patient has had severe systemic illness in the meantime preventing follow-up             ASSESSMENT/PLAN:  Exudative age-related macular degeneration of right eye with active choroidal neovascularization (HCC) Wet AMD subfoveal OD with encroachment and impact on acuity.  Slightly to moderately worse with active subretinal fluid In addition to intraretinal fluid and subfoveal scarring.  Now 8 months after last injection of Eylea.  We will need to restart intravitreal antivegF in the near future, plan in 1 week.  Intermediate stage nonexudative age-related macular degeneration of left eye OS stable no sign of CNVM     ICD-10-CM   1. Exudative age-related macular degeneration of right eye with active choroidal neovascularization (HCC)  H35.3211 OCT, Retina - OU - Both Eyes    2. Intermediate stage nonexudative age-related macular degeneration of left eye  H35.3122       1.  OS stable intermediate AMD  2.  OD, worsening of wet AMD, CNVM after 8  months of follow-up postinjection.  Patient had severe systemic illness, Guillain-Barr syndrome  This limited her access and ability to medical care  3.  We will schedule and order medication for injection intravitreal Eylea OD at  1 week  Ophthalmic Meds Ordered this visit:  No orders of the defined types were placed in this encounter.      Return in about 1 week (around 08/03/2021) for dilate, OD, EYLEA OCT.  There are no Patient Instructions on file for this  visit.   Explained the diagnoses, plan, and follow up with the patient and they expressed understanding.  Patient expressed understanding of the importance of proper follow up care.   Clent Demark Krisy Dix M.D. Diseases & Surgery of the Retina and Vitreous [facility]ina [facility] [place] 07/27/21     Abbreviations: M myopia (nearsighted); A astigmatism; H hyperopia (farsighted); P presbyopia; Mrx spectacle prescription;  CTL contact lenses; OD right eye; OS left eye; OU both eyes  XT exotropia; ET esotropia; PEK punctate epithelial keratitis; PEE punctate epithelial erosions; DES dry eye syndrome; MGD meibomian gland dysfunction; ATs artificial tears; PFAT's preservative free artificial tears; [place] nuclear sclerotic cataract; PSC posterior subcapsular cataract; ERM epi-retinal membrane; PVD posterior vitreous detachment; RD retinal detachment; DM diabetes mellitus; DR diabetic retinopathy; NPDR non-proliferative diabetic retinopathy; PDR proliferative diabetic retinopathy; CSME clinically significant macular edema; DME diabetic macular edema; dbh dot blot hemorrhages; CWS cotton wool spot; POAG primary open angle glaucoma; C/D cup-to-disc ratio; HVF humphrey visual field; GVF goldmann visual field; OCT optical coherence tomography; IOP intraocular pressure; BRVO Branch retinal vein occlusion; CRVO central retinal vein occlusion; CRAO central retinal artery occlusion; BRAO branch retinal artery occlusion; RT retinal tear; SB scleral buckle; PPV pars plana vitrectomy; VH Vitreous hemorrhage; PRP panretinal laser photocoagulation; IVK intravitreal kenalog; VMT vitreomacular traction; MH Macular hole;  NVD neovascularization of the disc; NVE neovascularization elsewhere; AREDS age related eye disease study; ARMD age related macular degeneration; POAG primary open angle glaucoma; EBMD epithelial/anterior basement membrane dystrophy; ACIOL anterior chamber intraocular lens; IOL intraocular lens; PCIOL posterior chamber  intraocular lens; Phaco/IOL phacoemulsification with intraocular lens placement; [place] photorefractive keratectomy; LASIK laser assisted in situ keratomileusis; HTN hypertension; DM diabetes mellitus; COPD chronic obstructive pulmonary disease

## 2021-07-29 DIAGNOSIS — Z87891 Personal history of nicotine dependence: Secondary | ICD-10-CM | POA: Diagnosis not present

## 2021-07-29 DIAGNOSIS — I10 Essential (primary) hypertension: Secondary | ICD-10-CM | POA: Diagnosis not present

## 2021-07-29 DIAGNOSIS — J449 Chronic obstructive pulmonary disease, unspecified: Secondary | ICD-10-CM | POA: Diagnosis not present

## 2021-07-29 DIAGNOSIS — F419 Anxiety disorder, unspecified: Secondary | ICD-10-CM | POA: Diagnosis not present

## 2021-07-29 DIAGNOSIS — G610 Guillain-Barre syndrome: Secondary | ICD-10-CM | POA: Diagnosis not present

## 2021-07-29 DIAGNOSIS — I700 Atherosclerosis of aorta: Secondary | ICD-10-CM | POA: Diagnosis not present

## 2021-07-31 DIAGNOSIS — Z299 Encounter for prophylactic measures, unspecified: Secondary | ICD-10-CM | POA: Diagnosis not present

## 2021-07-31 DIAGNOSIS — I10 Essential (primary) hypertension: Secondary | ICD-10-CM | POA: Diagnosis not present

## 2021-07-31 DIAGNOSIS — J449 Chronic obstructive pulmonary disease, unspecified: Secondary | ICD-10-CM | POA: Diagnosis not present

## 2021-07-31 DIAGNOSIS — G610 Guillain-Barre syndrome: Secondary | ICD-10-CM | POA: Diagnosis not present

## 2021-07-31 DIAGNOSIS — D509 Iron deficiency anemia, unspecified: Secondary | ICD-10-CM | POA: Diagnosis not present

## 2021-08-03 ENCOUNTER — Encounter (INDEPENDENT_AMBULATORY_CARE_PROVIDER_SITE_OTHER): Payer: Self-pay | Admitting: Ophthalmology

## 2021-08-03 ENCOUNTER — Other Ambulatory Visit: Payer: Self-pay

## 2021-08-03 ENCOUNTER — Ambulatory Visit (INDEPENDENT_AMBULATORY_CARE_PROVIDER_SITE_OTHER): Payer: Medicare Other | Admitting: Ophthalmology

## 2021-08-03 DIAGNOSIS — D509 Iron deficiency anemia, unspecified: Secondary | ICD-10-CM | POA: Diagnosis not present

## 2021-08-03 DIAGNOSIS — H353211 Exudative age-related macular degeneration, right eye, with active choroidal neovascularization: Secondary | ICD-10-CM

## 2021-08-03 MED ORDER — AFLIBERCEPT 2MG/0.05ML IZ SOLN FOR KALEIDOSCOPE
2.0000 mg | INTRAVITREAL | Status: AC | PRN
  Administered 2021-08-03: 2 mg via INTRAVITREAL

## 2021-08-03 NOTE — Assessment & Plan Note (Signed)
Recurrent CNVM some 8 months after most recent injection Eylea.  We will need to restart intravitreal Eylea today and follow-up again in 5 weeks

## 2021-08-03 NOTE — Progress Notes (Signed)
08/03/2021     CHIEF COMPLAINT Patient presents for  Chief Complaint  Patient presents with   Retina Follow Up      HISTORY OF PRESENT ILLNESS: Alexa Nichols is a 75 y.o. female who presents to the clinic today for:   HPI     Retina Follow Up   Patient presents with  Wet AMD.  In right eye.  This started 1 week ago.  Duration of 1 week.  Since onset it is stable.      Last edited by [name], COA on 08/03/2021  2:28 PM.      Referring physician: [name], MD [place],  [place] [id number]  HISTORICAL INFORMATION:   Selected notes from the MEDICAL RECORD NUMBER    Lab Results  Component Value Date   HGBA1C 4.6 (L) 02/17/2021     CURRENT MEDICATIONS: No current outpatient medications on file. (Ophthalmic Drugs)   No current facility-administered medications for this visit. (Ophthalmic Drugs)   Current Outpatient Medications (Other)  Medication Sig   acetaminophen (TYLENOL) 500 MG tablet Take 500 mg by mouth every 6 (six) hours as needed for moderate pain or headache.   albuterol (ACCUNEB) 0.63 MG/3ML nebulizer solution Take 1 ampule by nebulization every 6 (six) hours as needed for wheezing.   ALPRAZolam (XANAX) 0.25 MG tablet Take 1 tablet (0.25 mg total) by mouth daily as needed for anxiety or sleep.   ferrous sulfate 325 (65 FE) MG tablet Take 1 tablet (325 mg total) by mouth daily with breakfast.   gabapentin (NEURONTIN) 300 MG capsule Take 1 capsule (300 mg total) by mouth 3 (three) times daily.   mirtazapine (REMERON) 7.5 MG tablet Take 7.5 mg by mouth at bedtime.   montelukast (SINGULAIR) 10 MG tablet Take 10 mg by mouth at bedtime.   Multiple Vitamins-Minerals (PRESERVISION AREDS 2) CAPS Take 1 capsule by mouth in the morning and at bedtime.   ondansetron (ZOFRAN) 4 MG tablet Take 1 tablet (4 mg total) by mouth every 6 (six) hours as needed for nausea.   Oxycodone HCl 10 MG TABS Take 1 tablet (10 mg total) by mouth every 6 (six) hours  as needed.   pantoprazole (PROTONIX) 40 MG tablet Take 1 tablet (40 mg total) by mouth 2 (two) times daily.   Polyethylene Glycol 3350 (MIRALAX PO) Take by mouth. Bid   sertraline (ZOLOFT) 50 MG tablet Take 25 mg by mouth daily.   simvastatin (ZOCOR) 20 MG tablet Take 20 mg by mouth daily.   tiZANidine (ZANAFLEX) 4 MG capsule Take 4 mg by mouth 3 (three) times daily as needed for muscle spasms.   TRELEGY ELLIPTA 200-62.5-25 MCG/INH AEPB Inhale 1 puff into the lungs daily.   vitamin B-12 (CYANOCOBALAMIN) 500 MCG tablet Take 1 tablet (500 mcg total) by mouth daily.   No current facility-administered medications for this visit. (Other)      REVIEW OF SYSTEMS:    ALLERGIES Allergies  Allergen Reactions   Aspirin     samter's syndrome   Codeine Itching   Sulfa Antibiotics     Unknown reaction   Other Rash    gel filled fentanyl patch, adhesive on that patch caused ithcing    PAST MEDICAL HISTORY Past Medical History:  Diagnosis Date   Asthma    Chronic low back pain    COPD (chronic obstructive pulmonary disease) (HCC)    Depression    GERD (gastroesophageal reflux disease)    Alexa Nichols  syndrome ([place])    Heart murmur    Lobular carcinoma of left breast ([place] [facility] [place]) 1997   in situ   Other and unspecified hyperlipidemia    Seasonal allergies    Past Surgical History:  Procedure Laterality Date   BIOPSY  12/03/2020   Procedure: BIOPSY;  Surgeon: Rogene Houston, MD;  [facility];  Service: Endoscopy;;  gastric polyps biopsies;   BREAST SURGERY Left    lumpectomy    CESAREAN SECTION  1986   CHOLECYSTECTOMY     COLONOSCOPY WITH PROPOFOL N/A 12/03/2020    two 4-7 mm polyps in cecum, one small polyp in cecum, one 5 mm polyp, sigmoid diverticulosis, path with tubular adenomas   ESOPHAGOGASTRODUODENOSCOPY (EGD) WITH PROPOFOL N/A 12/03/2020   normal esophagus, multiple gastric polyps s/p biopsy, small paraesophageal hernia, normal duodenum. Fundic gland polyps.    ESOPHAGOGASTRODUODENOSCOPY (EGD) WITH PROPOFOL N/A 02/05/2021   Procedure: ESOPHAGOGASTRODUODENOSCOPY (EGD) WITH PROPOFOL;  Surgeon: [name], MD;  [facility];  Service: Endoscopy;  Laterality: N/A;   ESOPHAGOGASTRODUODENOSCOPY (EGD) WITH PROPOFOL N/A 03/03/2021   Procedure: ESOPHAGOGASTRODUODENOSCOPY (EGD) WITH PROPOFOL;  Surgeon: [name], MD;  [facility];  Service: Gastroenterology;  Laterality: N/A;  with possible push enteroscopy utilizing the pediatric colonoscope   GIVENS CAPSULE STUDY N/A 12/16/2020   normal small bowel capsule but rapid transit time of 21 minutes. Recommended to resume alendronate.    GIVENS CAPSULE STUDY N/A 02/04/2021   Procedure: GIVENS CAPSULE STUDY;  Surgeon: Rogene Houston, MD;  [facility];  Service: Endoscopy;  Laterality: N/A;   HOT HEMOSTASIS  02/05/2021   Procedure: HOT HEMOSTASIS (ARGON PLASMA COAGULATION/BICAP);  Surgeon: [name], MD;  [facility];  Service: Endoscopy;;   HOT HEMOSTASIS  03/03/2021   Procedure: HOT HEMOSTASIS (ARGON PLASMA COAGULATION/BICAP);  Surgeon: [name], [name], MD;  [facility];  Service: Gastroenterology;;   TONSILLECTOMY     age 48    FAMILY HISTORY Family History  Problem Relation Age of Onset   Osteoporosis Mother    Other Father        head trauma   Breast cancer Sister        X's 2 sisters    SOCIAL HISTORY Social History   Tobacco Use   Smoking status: Former    Packs/day: 0.50    Years: 20.00    Pack years: 10.00    Types: Cigarettes    Start date: 09/16/1964    Quit date: 10/05/1983    Years since quitting: 37.8   Smokeless tobacco: Never  Vaping Use   Vaping Use: Never used  Substance Use Topics   Alcohol use: Never    Alcohol/week: 0.0 standard drinks   Drug use: Never         OPHTHALMIC EXAM:  Base Eye Exam     Visual Acuity (ETDRS)       Right Left   Dist cc 20/150 ecc 20/20    Correction:  Glasses         Tonometry (Tonopen, 2:33 PM)       Right Left   Pressure 14 13         Pupils       Dark Light Shape React APD   Right 5 4 Round Minimal None   Left 4 3 Round Minimal None         Visual Fields       Left Right  Full    Restrictions  Central scotoma         Extraocular Movement       Right Left    Full, Ortho Full, Ortho         Neuro/Psych     Oriented x3: Yes   Mood/Affect: Normal         Dilation     Right eye: 1.0% Mydriacyl, 2.5% Phenylephrine @ 2:33 PM           Slit Lamp and Fundus Exam     External Exam       Right Left   External Normal Normal         Slit Lamp Exam       Right Left   Lids/Lashes Normal Normal   Conjunctiva/Sclera White and quiet White and quiet   Cornea Clear Clear   Anterior Chamber Deep and quiet Deep and quiet   Iris Round and reactive Round and reactive   Lens Posterior chamber intraocular lens Posterior chamber intraocular lens   Anterior Vitreous Normal Normal         Fundus Exam       Right Left   Posterior Vitreous Clear vitrectomized    Disc Normal    C/D Ratio 0.2    Macula Macular thickening, less Subretinal hemorrhage, Hard drusen, Retinal pigment epithelial rip.  And sub-RPE heme still persists, Retinal pigment epithelial detachment, no hemorrhage, Intermediate age related macular degeneration    Vessels Normal    Periphery Normal             IMAGING AND PROCEDURES  Imaging and Procedures for 08/03/21  OCT, Retina - OU - Both Eyes       Right Eye Quality was good. Scan locations included subfoveal. Central Foveal Thickness: 425. Progression has improved. Findings include abnormal foveal contour, outer retinal tubulation, subretinal hyper-reflective material, disciform scar, choroidal neovascular membrane, no IRF.   Left Eye Quality was good. Scan locations included subfoveal. Central Foveal Thickness: 306. Progression has been stable. Findings include  retinal drusen , normal foveal contour, no IRF, no SRF.   Notes Recurrent CNVM today with subfoveal leakage thickening, some months after most recent injection.  We will need to restart therapy today with intravitreal Eylea now     Intravitreal Injection, Pharmacologic Agent - OD - Right Eye       Time Out 08/03/2021. 2:58 PM. Confirmed correct patient, procedure, site, and patient consented.   Anesthesia Topical anesthesia was used. Anesthetic medications included Lidocaine 4%.   Procedure Preparation included Tobramycin 0.3%, 10% betadine to eyelids, 5% betadine to ocular surface. A 30 gauge needle was used.   Injection: 2 mg aflibercept 2 MG/0.05ML   Route: Intravitreal, Site: Right Eye   NDC: [id number], Lot: [id number], Waste: 0 mL   Post-op Post injection exam found visual acuity of at least counting fingers. The patient tolerated the procedure well. There were no complications. The patient received written and verbal post procedure care education. Post injection medications included ocuflox.              ASSESSMENT/PLAN:  Exudative age-related macular degeneration of right eye with active choroidal neovascularization (HCC) Recurrent CNVM some 8 months after most recent injection Eylea.  We will need to restart intravitreal Eylea today and follow-up again in 5 weeks     ICD-10-CM   1. Exudative age-related macular degeneration of right eye with active choroidal neovascularization (HCC)  H35.3211 OCT, Retina - OU - Both Eyes  Intravitreal Injection, Pharmacologic Agent - OD - Right Eye    aflibercept (EYLEA) SOLN 2 mg      1.  OD, reactivated CNVM thus wet AMD.  Subfoveal location.  We will need to restart antivegF.  Restart intravitreal Eylea OD today  2.  3.  Ophthalmic Meds Ordered this visit:  Meds ordered this encounter  Medications   aflibercept (EYLEA) SOLN 2 mg       Return in about 5 weeks (around 09/07/2021) for dilate, OD, EYLEA  OCT.  There are no Patient Instructions on file for this visit.   Explained the diagnoses, plan, and follow up with the patient and they expressed understanding.  Patient expressed understanding of the importance of proper follow up care.   Clent Demark Selen Smucker M.D. Diseases & Surgery of the Retina and Vitreous [facility] [place] 08/03/21     Abbreviations: M myopia (nearsighted); A astigmatism; H hyperopia (farsighted); P presbyopia; Mrx spectacle prescription;  CTL contact lenses; OD right eye; OS left eye; OU both eyes  XT exotropia; ET esotropia; PEK punctate epithelial keratitis; PEE punctate epithelial erosions; DES dry eye syndrome; MGD meibomian gland dysfunction; ATs artificial tears; PFAT's preservative free artificial tears; [place] nuclear sclerotic cataract; PSC posterior subcapsular cataract; ERM epi-retinal membrane; PVD posterior vitreous detachment; RD retinal detachment; DM diabetes mellitus; DR diabetic retinopathy; NPDR non-proliferative diabetic retinopathy; PDR proliferative diabetic retinopathy; CSME clinically significant macular edema; DME diabetic macular edema; dbh dot blot hemorrhages; CWS cotton wool spot; POAG primary open angle glaucoma; C/D cup-to-disc ratio; HVF humphrey visual field; GVF goldmann visual field; OCT optical coherence tomography; IOP intraocular pressure; BRVO Branch retinal vein occlusion; CRVO central retinal vein occlusion; CRAO central retinal artery occlusion; BRAO branch retinal artery occlusion; RT retinal tear; SB scleral buckle; PPV pars plana vitrectomy; VH Vitreous hemorrhage; PRP panretinal laser photocoagulation; IVK intravitreal kenalog; VMT vitreomacular traction; MH Macular hole;  NVD neovascularization of the disc; NVE neovascularization elsewhere; AREDS age related eye disease study; ARMD age related macular degeneration; POAG primary open angle glaucoma; EBMD epithelial/anterior basement membrane dystrophy; ACIOL anterior chamber  intraocular lens; IOL intraocular lens; PCIOL posterior chamber intraocular lens; Phaco/IOL phacoemulsification with intraocular lens placement; [place] photorefractive keratectomy; LASIK laser assisted in situ keratomileusis; HTN hypertension; DM diabetes mellitus; COPD chronic obstructive pulmonary disease

## 2021-08-05 DIAGNOSIS — I10 Essential (primary) hypertension: Secondary | ICD-10-CM | POA: Diagnosis not present

## 2021-08-05 DIAGNOSIS — G610 Guillain-Barre syndrome: Secondary | ICD-10-CM | POA: Diagnosis not present

## 2021-08-05 DIAGNOSIS — Z87891 Personal history of nicotine dependence: Secondary | ICD-10-CM | POA: Diagnosis not present

## 2021-08-05 DIAGNOSIS — I700 Atherosclerosis of aorta: Secondary | ICD-10-CM | POA: Diagnosis not present

## 2021-08-05 DIAGNOSIS — F419 Anxiety disorder, unspecified: Secondary | ICD-10-CM | POA: Diagnosis not present

## 2021-08-05 DIAGNOSIS — J449 Chronic obstructive pulmonary disease, unspecified: Secondary | ICD-10-CM | POA: Diagnosis not present

## 2021-08-21 ENCOUNTER — Ambulatory Visit: Payer: Medicare Other | Admitting: Neurology

## 2021-09-04 ENCOUNTER — Encounter (INDEPENDENT_AMBULATORY_CARE_PROVIDER_SITE_OTHER): Payer: Self-pay

## 2021-09-07 ENCOUNTER — Other Ambulatory Visit: Payer: Self-pay

## 2021-09-07 ENCOUNTER — Encounter (INDEPENDENT_AMBULATORY_CARE_PROVIDER_SITE_OTHER): Payer: Self-pay | Admitting: Ophthalmology

## 2021-09-07 ENCOUNTER — Encounter (INDEPENDENT_AMBULATORY_CARE_PROVIDER_SITE_OTHER): Payer: Medicare Other | Admitting: Ophthalmology

## 2021-09-07 ENCOUNTER — Ambulatory Visit (INDEPENDENT_AMBULATORY_CARE_PROVIDER_SITE_OTHER): Payer: Medicare Other | Admitting: Ophthalmology

## 2021-09-07 DIAGNOSIS — H353211 Exudative age-related macular degeneration, right eye, with active choroidal neovascularization: Secondary | ICD-10-CM | POA: Diagnosis not present

## 2021-09-07 DIAGNOSIS — H353122 Nonexudative age-related macular degeneration, left eye, intermediate dry stage: Secondary | ICD-10-CM

## 2021-09-07 MED ORDER — AFLIBERCEPT 2MG/0.05ML IZ SOLN FOR KALEIDOSCOPE
2.0000 mg | INTRAVITREAL | Status: AC | PRN
  Administered 2021-09-07: 2 mg via INTRAVITREAL

## 2021-09-07 NOTE — Assessment & Plan Note (Signed)

## 2021-09-07 NOTE — Progress Notes (Signed)
09/07/2021     CHIEF COMPLAINT Patient presents for  Chief Complaint  Patient presents with   Retina Follow Up      HISTORY OF PRESENT ILLNESS: Alexa Nichols is a 75 y.o. female who presents to the clinic today for:   HPI     Retina Follow Up   Patient presents with  Wet AMD.  In right eye.  This started 5 weeks ago.  Duration of 5 weeks.  Since onset it is stable.        Comments   5 week fu OD oct Eylea OD. Patient states vision is stable and unchanged since last visit. Denies any new floaters or FOL.       Last edited by Laurin Coder on 09/07/2021  1:56 PM.      Referring physician: [name], MD [place],  [place] [address]  HISTORICAL INFORMATION:   Selected notes from the MEDICAL RECORD NUMBER    Lab Results  Component Value Date   HGBA1C 4.6 (L) 02/17/2021     CURRENT MEDICATIONS: No current outpatient medications on file. (Ophthalmic Drugs)   No current facility-administered medications for this visit. (Ophthalmic Drugs)   Current Outpatient Medications (Other)  Medication Sig   acetaminophen (TYLENOL) 500 MG tablet Take 500 mg by mouth every 6 (six) hours as needed for moderate pain or headache.   albuterol (ACCUNEB) 0.63 MG/3ML nebulizer solution Take 1 ampule by nebulization every 6 (six) hours as needed for wheezing.   ALPRAZolam (XANAX) 0.25 MG tablet Take 1 tablet (0.25 mg total) by mouth daily as needed for anxiety or sleep.   ferrous sulfate 325 (65 FE) MG tablet Take 1 tablet (325 mg total) by mouth daily with breakfast.   gabapentin (NEURONTIN) 300 MG capsule Take 1 capsule (300 mg total) by mouth 3 (three) times daily.   mirtazapine (REMERON) 7.5 MG tablet Take 7.5 mg by mouth at bedtime.   montelukast (SINGULAIR) 10 MG tablet Take 10 mg by mouth at bedtime.   Multiple Vitamins-Minerals (PRESERVISION AREDS 2) CAPS Take 1 capsule by mouth in the morning and at bedtime.   ondansetron (ZOFRAN) 4 MG tablet Take 1 tablet (4  mg total) by mouth every 6 (six) hours as needed for nausea.   Oxycodone HCl 10 MG TABS Take 1 tablet (10 mg total) by mouth every 6 (six) hours as needed.   pantoprazole (PROTONIX) 40 MG tablet Take 1 tablet (40 mg total) by mouth 2 (two) times daily.   Polyethylene Glycol 3350 (MIRALAX PO) Take by mouth. Bid   sertraline (ZOLOFT) 50 MG tablet Take 25 mg by mouth daily.   simvastatin (ZOCOR) 20 MG tablet Take 20 mg by mouth daily.   tiZANidine (ZANAFLEX) 4 MG capsule Take 4 mg by mouth 3 (three) times daily as needed for muscle spasms.   TRELEGY ELLIPTA 200-62.5-25 MCG/INH AEPB Inhale 1 puff into the lungs daily.   vitamin B-12 (CYANOCOBALAMIN) 500 MCG tablet Take 1 tablet (500 mcg total) by mouth daily.   No current facility-administered medications for this visit. (Other)      REVIEW OF SYSTEMS:    ALLERGIES Allergies  Allergen Reactions   Aspirin     samter's syndrome   Codeine Itching   Sulfa Antibiotics     Unknown reaction   Other Rash    gel filled fentanyl patch, adhesive on that patch caused ithcing    PAST MEDICAL HISTORY Past Medical History:  Diagnosis Date  Asthma    Chronic low back pain    COPD (chronic obstructive pulmonary disease) (HCC)    Depression    GERD (gastroesophageal reflux disease)    Guillain Barr syndrome (HCC)    Heart murmur    Lobular carcinoma of left breast ([place]) 1997   in situ   Other and unspecified hyperlipidemia    Seasonal allergies    Past Surgical History:  Procedure Laterality Date   BIOPSY  12/03/2020   Procedure: BIOPSY;  Surgeon: Rogene Houston, MD;  [facility];  Service: Endoscopy;;  gastric polyps biopsies;   BREAST SURGERY Left    lumpectomy    [place]     COLONOSCOPY WITH PROPOFOL N/A 12/03/2020    two 4-7 mm polyps in cecum, one small polyp in cecum, one 5 mm polyp, sigmoid diverticulosis, path with tubular adenomas   ESOPHAGOGASTRODUODENOSCOPY (EGD) WITH PROPOFOL  N/A 12/03/2020   normal esophagus, multiple gastric polyps s/p biopsy, small paraesophageal hernia, normal duodenum. Fundic gland polyps.   ESOPHAGOGASTRODUODENOSCOPY (EGD) WITH PROPOFOL N/A 02/05/2021   Procedure: ESOPHAGOGASTRODUODENOSCOPY (EGD) WITH PROPOFOL;  Surgeon: [name], MD;  [facility];  Service: Endoscopy;  Laterality: N/A;   ESOPHAGOGASTRODUODENOSCOPY (EGD) WITH PROPOFOL N/A 03/03/2021   Procedure: ESOPHAGOGASTRODUODENOSCOPY (EGD) WITH PROPOFOL;  Surgeon: [name], MD;  [facility];  Service: Gastroenterology;  Laterality: N/A;  with possible push enteroscopy utilizing the pediatric colonoscope   GIVENS CAPSULE STUDY N/A 12/16/2020   normal small bowel capsule but rapid transit time of 21 minutes. Recommended to resume alendronate.    GIVENS CAPSULE STUDY N/A 02/04/2021   Procedure: GIVENS CAPSULE STUDY;  Surgeon: Rogene Houston, MD;  [facility];  Service: Endoscopy;  Laterality: N/A;   HOT HEMOSTASIS  02/05/2021   Procedure: HOT HEMOSTASIS (ARGON PLASMA COAGULATION/BICAP);  Surgeon: [name], MD;  [facility];  Service: Endoscopy;;   HOT HEMOSTASIS  03/03/2021   Procedure: HOT HEMOSTASIS (ARGON PLASMA COAGULATION/BICAP);  Surgeon: [name], [name], MD;  [facility];  Service: Gastroenterology;;   TONSILLECTOMY     age 80    FAMILY HISTORY Family History  Problem Relation Age of Onset   Osteoporosis Mother    Other Father        head trauma   Breast cancer Sister        X's 2 sisters    SOCIAL HISTORY Social History   Tobacco Use   Smoking status: Former    Packs/day: 0.50    Years: 20.00    Pack years: 10.00    Types: Cigarettes    Start date: 09/16/1964    Quit date: 10/05/1983    Years since quitting: 37.9   Smokeless tobacco: Never  Vaping Use   Vaping Use: Never used  Substance Use Topics   Alcohol use: Never    Alcohol/week: 0.0 standard drinks   Drug use: Never          OPHTHALMIC EXAM:  Base Eye Exam     Visual Acuity (ETDRS)       Right Left   Dist [place] 20/200 20/20 -2   Dist ph Jermyn NI     Correction: Glasses         Tonometry (Tonopen, 1:58 PM)       Right Left   Pressure 10 9         Pupils       Dark  Light APD   Right 5 4 None   Left 4 3 None         Extraocular Movement       Right Left    Full Full         Neuro/Psych     Oriented x3: Yes   Mood/Affect: Normal         Dilation     Right eye: 1.0% Mydriacyl, 2.5% Phenylephrine @ 1:58 PM           Slit Lamp and Fundus Exam     External Exam       Right Left   External Normal Normal         Slit Lamp Exam       Right Left   Lids/Lashes Normal Normal   Conjunctiva/Sclera White and quiet White and quiet   Cornea Clear Clear   Anterior Chamber Deep and quiet Deep and quiet   Iris Round and reactive Round and reactive   Lens Posterior chamber intraocular lens Posterior chamber intraocular lens   Anterior Vitreous Normal Normal         Fundus Exam       Right Left   Posterior Vitreous Clear vitrectomized    Disc Normal    C/D Ratio 0.2    Macula Macular thickening, less Subretinal hemorrhage, Hard drusen, Retinal pigment epithelial rip.  And sub-RPE heme still persists, Retinal pigment epithelial detachment, no hemorrhage, Intermediate age related macular degeneration    Vessels Normal    Periphery Normal             IMAGING AND PROCEDURES  Imaging and Procedures for 09/07/21  Intravitreal Injection, Pharmacologic Agent - OD - Right Eye       Time Out 09/07/2021. 2:41 PM. Confirmed correct patient, procedure, site, and patient consented.   Anesthesia Topical anesthesia was used. Anesthetic medications included Lidocaine 4%.   Procedure Preparation included Tobramycin 0.3%, 10% betadine to eyelids, 5% betadine to ocular surface. A 30 gauge needle was used.   Injection: 2 mg aflibercept 2 MG/0.05ML   Route:  Intravitreal, Site: Right Eye   NDC: [id number], Lot: [id number], Waste: 0 mL   Post-op Post injection exam found visual acuity of at least counting fingers. The patient tolerated the procedure well. There were no complications. The patient received written and verbal post procedure care education. Post injection medications included ocuflox.      OCT, Retina - OU - Both Eyes       Right Eye Quality was good. Scan locations included subfoveal. Central Foveal Thickness: 400. Progression has improved. Findings include abnormal foveal contour, outer retinal tubulation, subretinal hyper-reflective material, disciform scar, choroidal neovascular membrane, no IRF.   Left Eye Quality was good. Scan locations included subfoveal. Central Foveal Thickness: 306. Progression has been stable. Findings include retinal drusen , normal foveal contour, no IRF, no SRF.   Notes Recurrent CNVM yet now overall improving macular findings and anatomy we will need to restart therapy today with intravitreal Eylea now With less subretinal and intraretinal fluid with subfoveal outer retinal scarring OD currently at 5-week follow-up             ASSESSMENT/PLAN:  Intermediate stage nonexudative age-related macular degeneration of left eye The nature of age--related macular degeneration was discussed with the patient as well as the distinction between dry and wet types. Checking an Amsler Grid daily with advice to return immediately should a distortion develop, was given to the patient. The patient '  s smoking status now and in the past was determined and advice based on the AREDS study was provided regarding the consumption of antioxidant supplements. AREDS 2 vitamin formulation was recommended. Consumption of dark leafy vegetables and fresh fruits of various colors was recommended. Treatment modalities for wet macular degeneration particularly the use of intravitreal injections of anti-blood vessel growth factors  was discussed with the patient. Avastin, Lucentis, and Eylea are the available options. On occasion, therapy includes the use of photodynamic therapy and thermal laser. Stressed to the patient do not rub eyes.  Patient was advised to check Amsler Grid daily and return immediately if changes are noted. Instructions on using the grid were given to the patient. All patient questions were answered.  Exudative age-related macular degeneration of right eye with active choroidal neovascularization ([place]) OD, vastly improved macular findings.  The foveal disciform scarring limits acuity yet less intraretinal fluid and subretinal fluid today at 5-week interval post Eylea     ICD-10-CM   1. Exudative age-related macular degeneration of right eye with active choroidal neovascularization (HCC)  H35.3211 Intravitreal Injection, Pharmacologic Agent - OD - Right Eye    OCT, Retina - OU - Both Eyes    aflibercept (EYLEA) SOLN 2 mg    2. Intermediate stage nonexudative age-related macular degeneration of left eye  H35.3122       1.  OD, vastly improved macular anatomy after reinstitution of therapeutic injection Eylea some 1 month previous.  We will repeat injection today to maintain visual acuity and preserve macular anatomy OD  2.  OS no sign of CNVM development OS  3.  Ophthalmic Meds Ordered this visit:  Meds ordered this encounter  Medications   aflibercept (EYLEA) SOLN 2 mg       Return in about 5 weeks (around 10/12/2021) for DILATE OU, EYLEA OCT, OD.  There are no Patient Instructions on file for this visit.   Explained the diagnoses, plan, and follow up with the patient and they expressed understanding.  Patient expressed understanding of the importance of proper follow up care.   Clent Demark Jalaya Sarver M.D. Diseases & Surgery of the Retina and Vitreous [facility] [place] 09/07/21     Abbreviations: M myopia (nearsighted); A astigmatism; H hyperopia (farsighted); P presbyopia; Mrx  spectacle prescription;  CTL contact lenses; OD right eye; OS left eye; OU both eyes  XT exotropia; ET esotropia; PEK punctate epithelial keratitis; PEE punctate epithelial erosions; DES dry eye syndrome; MGD meibomian gland dysfunction; ATs artificial tears; PFAT's preservative free artificial tears; [place] nuclear sclerotic cataract; PSC posterior subcapsular cataract; ERM epi-retinal membrane; PVD posterior vitreous detachment; RD retinal detachment; DM diabetes mellitus; DR diabetic retinopathy; NPDR non-proliferative diabetic retinopathy; PDR proliferative diabetic retinopathy; CSME clinically significant macular edema; DME diabetic macular edema; dbh dot blot hemorrhages; CWS cotton wool spot; POAG primary open angle glaucoma; C/D cup-to-disc ratio; HVF humphrey visual field; GVF goldmann visual field; OCT optical coherence tomography; IOP intraocular pressure; BRVO Branch retinal vein occlusion; CRVO central retinal vein occlusion; CRAO central retinal artery occlusion; BRAO branch retinal artery occlusion; RT retinal tear; SB scleral buckle; PPV pars plana vitrectomy; VH Vitreous hemorrhage; PRP panretinal laser photocoagulation; IVK intravitreal kenalog; VMT vitreomacular traction; MH Macular hole;  NVD neovascularization of the disc; NVE neovascularization elsewhere; AREDS age related eye disease study; ARMD age related macular degeneration; POAG primary open angle glaucoma; EBMD epithelial/anterior basement membrane dystrophy; ACIOL anterior chamber intraocular lens; IOL intraocular lens; PCIOL posterior chamber intraocular lens; Phaco/IOL phacoemulsification  with intraocular lens placement; [place] photorefractive keratectomy; LASIK laser assisted in situ keratomileusis; HTN hypertension; DM diabetes mellitus; COPD chronic obstructive pulmonary disease

## 2021-09-07 NOTE — Assessment & Plan Note (Signed)
OD, vastly improved macular findings.  The foveal disciform scarring limits acuity yet less intraretinal fluid and subretinal fluid today at 5-week interval post [facility]

## 2021-10-02 DIAGNOSIS — J449 Chronic obstructive pulmonary disease, unspecified: Secondary | ICD-10-CM | POA: Diagnosis not present

## 2021-10-02 DIAGNOSIS — M910 Juvenile osteochondrosis of pelvis: Secondary | ICD-10-CM | POA: Diagnosis not present

## 2021-10-02 DIAGNOSIS — G610 Guillain-Barre syndrome: Secondary | ICD-10-CM | POA: Diagnosis not present

## 2021-10-12 ENCOUNTER — Encounter (INDEPENDENT_AMBULATORY_CARE_PROVIDER_SITE_OTHER): Payer: Self-pay | Admitting: Ophthalmology

## 2021-10-12 ENCOUNTER — Other Ambulatory Visit: Payer: Self-pay

## 2021-10-12 ENCOUNTER — Ambulatory Visit (INDEPENDENT_AMBULATORY_CARE_PROVIDER_SITE_OTHER): Payer: Medicare Other | Admitting: Ophthalmology

## 2021-10-12 DIAGNOSIS — H353121 Nonexudative age-related macular degeneration, left eye, early dry stage: Secondary | ICD-10-CM

## 2021-10-12 DIAGNOSIS — H353211 Exudative age-related macular degeneration, right eye, with active choroidal neovascularization: Secondary | ICD-10-CM

## 2021-10-12 MED ORDER — AFLIBERCEPT 2MG/0.05ML IZ SOLN FOR KALEIDOSCOPE
2.0000 mg | INTRAVITREAL | Status: AC | PRN
  Administered 2021-10-12: 2 mg via INTRAVITREAL

## 2021-10-12 NOTE — Assessment & Plan Note (Signed)
No sign of CNVM formation OS by examination or OCT

## 2021-10-12 NOTE — Progress Notes (Signed)
10/12/2021     CHIEF COMPLAINT Patient presents for  Chief Complaint  Patient presents with   Retina Follow Up      HISTORY OF PRESENT ILLNESS: Alexa Nichols is a 76 y.o. female who presents to the clinic today for:   HPI     Retina Follow Up           Diagnosis: Wet AMD   Laterality: right eye   Onset: 5 weeks ago   Duration: 5 weeks   Course: stable         Comments   5 week fu Dilate OU, Eylea OCT, OD. Patient states vision is stable and unchanged since last visit. Denies any new floaters or FOL.       Last edited by Laurin Coder on 10/12/2021  2:37 PM.      Referring physician: [name], MD [place],  [place] [address]  HISTORICAL INFORMATION:   Selected notes from the MEDICAL RECORD NUMBER    Lab Results  Component Value Date   HGBA1C 4.6 (L) 02/17/2021     CURRENT MEDICATIONS: No current outpatient medications on file. (Ophthalmic Drugs)   No current facility-administered medications for this visit. (Ophthalmic Drugs)   Current Outpatient Medications (Other)  Medication Sig   acetaminophen (TYLENOL) 500 MG tablet Take 500 mg by mouth every 6 (six) hours as needed for moderate pain or headache.   albuterol (ACCUNEB) 0.63 MG/3ML nebulizer solution Take 1 ampule by nebulization every 6 (six) hours as needed for wheezing.   ALPRAZolam (XANAX) 0.25 MG tablet Take 1 tablet (0.25 mg total) by mouth daily as needed for anxiety or sleep.   ferrous sulfate 325 (65 FE) MG tablet Take 1 tablet (325 mg total) by mouth daily with breakfast.   gabapentin (NEURONTIN) 300 MG capsule Take 1 capsule (300 mg total) by mouth 3 (three) times daily.   mirtazapine (REMERON) 7.5 MG tablet Take 7.5 mg by mouth at bedtime.   montelukast (SINGULAIR) 10 MG tablet Take 10 mg by mouth at bedtime.   Multiple Vitamins-Minerals (PRESERVISION AREDS 2) CAPS Take 1 capsule by mouth in the morning and at bedtime.   ondansetron (ZOFRAN) 4 MG tablet Take 1 tablet (4  mg total) by mouth every 6 (six) hours as needed for nausea.   Oxycodone HCl 10 MG TABS Take 1 tablet (10 mg total) by mouth every 6 (six) hours as needed.   pantoprazole (PROTONIX) 40 MG tablet Take 1 tablet (40 mg total) by mouth 2 (two) times daily.   Polyethylene Glycol 3350 (MIRALAX PO) Take by mouth. Bid   sertraline (ZOLOFT) 50 MG tablet Take 25 mg by mouth daily.   simvastatin (ZOCOR) 20 MG tablet Take 20 mg by mouth daily.   tiZANidine (ZANAFLEX) 4 MG capsule Take 4 mg by mouth 3 (three) times daily as needed for muscle spasms.   TRELEGY ELLIPTA 200-62.5-25 MCG/INH AEPB Inhale 1 puff into the lungs daily.   vitamin B-12 (CYANOCOBALAMIN) 500 MCG tablet Take 1 tablet (500 mcg total) by mouth daily.   No current facility-administered medications for this visit. (Other)      REVIEW OF SYSTEMS:    ALLERGIES Allergies  Allergen Reactions   Aspirin     samter's syndrome   Codeine Itching   Sulfa Antibiotics     Unknown reaction   Other Rash    gel filled fentanyl patch, adhesive on that patch caused ithcing    PAST MEDICAL HISTORY Past Medical  History:  Diagnosis Date   Asthma    Chronic low back pain    COPD (chronic obstructive pulmonary disease) (HCC)    Depression    GERD (gastroesophageal reflux disease)    Guillain Barr syndrome (HCC)    Heart murmur    Lobular carcinoma of left breast ([place]) 1997   in situ   Other and unspecified hyperlipidemia    Seasonal allergies    Past Surgical History:  Procedure Laterality Date   BIOPSY  12/03/2020   Procedure: BIOPSY;  Surgeon: Rogene Houston, MD;  [facility];  Service: Endoscopy;;  gastric polyps biopsies;   BREAST SURGERY Left    lumpectomy    [place]     COLONOSCOPY WITH PROPOFOL N/A 12/03/2020    two 4-7 mm polyps in cecum, one small polyp in cecum, one 5 mm polyp, sigmoid diverticulosis, path with tubular adenomas   ESOPHAGOGASTRODUODENOSCOPY (EGD) WITH PROPOFOL  N/A 12/03/2020   normal esophagus, multiple gastric polyps s/p biopsy, small paraesophageal hernia, normal duodenum. Fundic gland polyps.   ESOPHAGOGASTRODUODENOSCOPY (EGD) WITH PROPOFOL N/A 02/05/2021   Procedure: ESOPHAGOGASTRODUODENOSCOPY (EGD) WITH PROPOFOL;  Surgeon: [name], MD;  [facility];  Service: Endoscopy;  Laterality: N/A;   ESOPHAGOGASTRODUODENOSCOPY (EGD) WITH PROPOFOL N/A 03/03/2021   Procedure: ESOPHAGOGASTRODUODENOSCOPY (EGD) WITH PROPOFOL;  Surgeon: [name], MD;  [facility];  Service: Gastroenterology;  Laterality: N/A;  with possible push enteroscopy utilizing the pediatric colonoscope   GIVENS CAPSULE STUDY N/A 12/16/2020   normal small bowel capsule but rapid transit time of 21 minutes. Recommended to resume alendronate.    GIVENS CAPSULE STUDY N/A 02/04/2021   Procedure: GIVENS CAPSULE STUDY;  Surgeon: Rogene Houston, MD;  [facility];  Service: Endoscopy;  Laterality: N/A;   HOT HEMOSTASIS  02/05/2021   Procedure: HOT HEMOSTASIS (ARGON PLASMA COAGULATION/BICAP);  Surgeon: [name], MD;  [facility];  Service: Endoscopy;;   HOT HEMOSTASIS  03/03/2021   Procedure: HOT HEMOSTASIS (ARGON PLASMA COAGULATION/BICAP);  Surgeon: [name], [name], MD;  [facility];  Service: Gastroenterology;;   TONSILLECTOMY     age 54    FAMILY HISTORY Family History  Problem Relation Age of Onset   Osteoporosis Mother    Other Father        head trauma   Breast cancer Sister        X's 2 sisters    SOCIAL HISTORY Social History   Tobacco Use   Smoking status: Former    Packs/day: 0.50    Years: 20.00    Pack years: 10.00    Types: Cigarettes    Start date: 09/16/1964    Quit date: 10/05/1983    Years since quitting: 38.0   Smokeless tobacco: Never  Vaping Use   Vaping Use: Never used  Substance Use Topics   Alcohol use: Never    Alcohol/week: 0.0 standard drinks   Drug use: Never          OPHTHALMIC EXAM:  Base Eye Exam     Visual Acuity (ETDRS)       Right Left   Dist cc 20/200 20/25 -1   Dist ph cc NI     Correction: Glasses         Tonometry (Tonopen, 2:40 PM)       Right Left   Pressure 14 15         Pupils  Pupils Dark Light APD   Right PERRL 5 4 None   Left PERRL 5 4 None         Extraocular Movement       Right Left    Full Full         Neuro/Psych     Oriented x3: Yes   Mood/Affect: Normal         Dilation     Both eyes: 1.0% Mydriacyl, 2.5% Phenylephrine @ 2:41 PM           Slit Lamp and Fundus Exam     External Exam       Right Left   External Normal Normal         Slit Lamp Exam       Right Left   Lids/Lashes Normal Normal   Conjunctiva/Sclera White and quiet White and quiet   Cornea Clear Clear   Anterior Chamber Deep and quiet Deep and quiet   Iris Round and reactive Round and reactive   Lens Posterior chamber intraocular lens Posterior chamber intraocular lens   Anterior Vitreous Normal Normal         Fundus Exam       Right Left   Posterior Vitreous Clear vitrectomized Normal   Disc Normal Normal   C/D Ratio 0.2 0.2   Macula Macular thickening, less Subretinal hemorrhage, Hard drusen, Retinal pigment epithelial rip.  And sub-RPE heme still persists, Retinal pigment epithelial detachment, no hemorrhage, Intermediate age related macular degeneration Early age related macular degeneration, no macular thickening, no microaneurysms, Hard drusen   Vessels Normal Normal   Periphery Normal Normal            IMAGING AND PROCEDURES  Imaging and Procedures for 10/12/21  OCT, Retina - OU - Both Eyes       Right Eye Quality was good. Scan locations included subfoveal. Central Foveal Thickness: 356. Progression has improved. Findings include abnormal foveal contour, outer retinal tubulation, subretinal hyper-reflective material, disciform scar, choroidal neovascular membrane, no  IRF.   Left Eye Quality was good. Scan locations included subfoveal. Central Foveal Thickness: 308. Progression has been stable. Findings include retinal drusen , normal foveal contour, no IRF, no SRF.   Notes Recurrent CNVM yet now overall improving macular findings and anatomy we will need to repeat injection today as macular anatomy has improved again at the interval follow-up of 5 weeks.  We will extend interval next to 6 weeks     Intravitreal Injection, Pharmacologic Agent - OD - Right Eye       Time Out 10/12/2021. 3:24 PM. Confirmed correct patient, procedure, site, and patient consented.   Anesthesia Topical anesthesia was used. Anesthetic medications included Lidocaine 4%.   Procedure Preparation included Tobramycin 0.3%, 10% betadine to eyelids, 5% betadine to ocular surface. A 30 gauge needle was used.   Injection: 2 mg aflibercept 2 MG/0.05ML   Route: Intravitreal, Site: Right Eye   NDC: [id number], Lot: [id number], Waste: 0 mL   Post-op Post injection exam found visual acuity of at least counting fingers. The patient tolerated the procedure well. There were no complications. The patient received written and verbal post procedure care education. Post injection medications included ocuflox.              ASSESSMENT/PLAN:  Exudative age-related macular degeneration of right eye with active choroidal neovascularization (HCC) Much improved macular anatomy at 5 weeks post most recent injection Eylea.  We will repeat injection today to maintain improved macular  anatomy and stabilized acuity.  We will repeat in evaluation next in 6 weeks  Early stage nonexudative age-related macular degeneration of left eye No sign of CNVM formation OS by examination or OCT     ICD-10-CM   1. Exudative age-related macular degeneration of right eye with active choroidal neovascularization (HCC)  H35.3211 OCT, Retina - OU - Both Eyes    Intravitreal Injection, Pharmacologic Agent -  OD - Right Eye    aflibercept (EYLEA) SOLN 2 mg    2. Early stage nonexudative age-related macular degeneration of left eye  H35.3121       1.  OD subfoveal CNVM formation, reactivated October 2022, now much improved macular anatomy and stabilized acuity post Eylea most recently injection 5 weeks previous.  We will repeat today follow-up next in 6 weeks  2.  3.  Ophthalmic Meds Ordered this visit:  Meds ordered this encounter  Medications   aflibercept (EYLEA) SOLN 2 mg       Return in about 6 weeks (around 11/23/2021) for OD, EYLEA OCT.  There are no Patient Instructions on file for this visit.   Explained the diagnoses, plan, and follow up with the patient and they expressed understanding.  Patient expressed understanding of the importance of proper follow up care.   Clent Demark Yonah Tangeman M.D. Diseases & Surgery of the Retina and Vitreous [facility]ina [facility] [place] 10/12/21     Abbreviations: M myopia (nearsighted); A astigmatism; H hyperopia (farsighted); P presbyopia; Mrx spectacle prescription;  CTL contact lenses; OD right eye; OS left eye; OU both eyes  XT exotropia; ET esotropia; PEK punctate epithelial keratitis; PEE punctate epithelial erosions; DES dry eye syndrome; MGD meibomian gland dysfunction; ATs artificial tears; PFAT's preservative free artificial tears; [place] nuclear sclerotic cataract; PSC posterior subcapsular cataract; ERM epi-retinal membrane; PVD posterior vitreous detachment; RD retinal detachment; DM diabetes mellitus; DR diabetic retinopathy; NPDR non-proliferative diabetic retinopathy; PDR proliferative diabetic retinopathy; CSME clinically significant macular edema; DME diabetic macular edema; dbh dot blot hemorrhages; CWS cotton wool spot; POAG primary open angle glaucoma; C/D cup-to-disc ratio; HVF humphrey visual field; GVF goldmann visual field; OCT optical coherence tomography; IOP intraocular pressure; BRVO Branch retinal vein occlusion; CRVO central  retinal vein occlusion; CRAO central retinal artery occlusion; BRAO branch retinal artery occlusion; RT retinal tear; SB scleral buckle; PPV pars plana vitrectomy; VH Vitreous hemorrhage; PRP panretinal laser photocoagulation; IVK intravitreal kenalog; VMT vitreomacular traction; MH Macular hole;  NVD neovascularization of the disc; NVE neovascularization elsewhere; AREDS age related eye disease study; ARMD age related macular degeneration; POAG primary open angle glaucoma; EBMD epithelial/anterior basement membrane dystrophy; ACIOL anterior chamber intraocular lens; IOL intraocular lens; PCIOL posterior chamber intraocular lens; Phaco/IOL phacoemulsification with intraocular lens placement; [place] photorefractive keratectomy; LASIK laser assisted in situ keratomileusis; HTN hypertension; DM diabetes mellitus; COPD chronic obstructive pulmonary disease

## 2021-10-12 NOTE — Assessment & Plan Note (Signed)
Much improved macular anatomy at 5 weeks post most recent injection Eylea.  We will repeat injection today to maintain improved macular anatomy and stabilized acuity.  We will repeat in evaluation next in 6 weeks

## 2021-10-20 DIAGNOSIS — I700 Atherosclerosis of aorta: Secondary | ICD-10-CM | POA: Diagnosis not present

## 2021-10-20 DIAGNOSIS — M542 Cervicalgia: Secondary | ICD-10-CM | POA: Diagnosis not present

## 2021-10-20 DIAGNOSIS — M546 Pain in thoracic spine: Secondary | ICD-10-CM | POA: Diagnosis not present

## 2021-10-20 DIAGNOSIS — J449 Chronic obstructive pulmonary disease, unspecified: Secondary | ICD-10-CM | POA: Diagnosis not present

## 2021-10-20 DIAGNOSIS — F419 Anxiety disorder, unspecified: Secondary | ICD-10-CM | POA: Diagnosis not present

## 2021-10-20 DIAGNOSIS — I10 Essential (primary) hypertension: Secondary | ICD-10-CM | POA: Diagnosis not present

## 2021-10-20 DIAGNOSIS — Z299 Encounter for prophylactic measures, unspecified: Secondary | ICD-10-CM | POA: Diagnosis not present

## 2021-10-21 DIAGNOSIS — H353211 Exudative age-related macular degeneration, right eye, with active choroidal neovascularization: Secondary | ICD-10-CM | POA: Diagnosis not present

## 2021-10-21 DIAGNOSIS — H35351 Cystoid macular degeneration, right eye: Secondary | ICD-10-CM | POA: Diagnosis not present

## 2021-10-21 DIAGNOSIS — Z961 Presence of intraocular lens: Secondary | ICD-10-CM | POA: Diagnosis not present

## 2021-11-19 ENCOUNTER — Encounter (INDEPENDENT_AMBULATORY_CARE_PROVIDER_SITE_OTHER): Payer: Self-pay | Admitting: Gastroenterology

## 2021-11-19 ENCOUNTER — Ambulatory Visit (INDEPENDENT_AMBULATORY_CARE_PROVIDER_SITE_OTHER): Payer: Medicare Other | Admitting: Gastroenterology

## 2021-11-19 ENCOUNTER — Other Ambulatory Visit: Payer: Self-pay

## 2021-11-19 VITALS — BP 137/66 | HR 74 | Temp 98.3°F | Ht <= 58 in | Wt 142.3 lb

## 2021-11-19 DIAGNOSIS — K5520 Angiodysplasia of colon without hemorrhage: Secondary | ICD-10-CM | POA: Diagnosis not present

## 2021-11-19 DIAGNOSIS — K31819 Angiodysplasia of stomach and duodenum without bleeding: Secondary | ICD-10-CM | POA: Diagnosis not present

## 2021-11-19 DIAGNOSIS — D649 Anemia, unspecified: Secondary | ICD-10-CM

## 2021-11-19 NOTE — Patient Instructions (Signed)
Follow up with Dr. Woody Seller to check CBC and iron stores If worsening iron deficiency restart oral iron daily Avoid using high dose aspirin including Goody/BC powders, NSAIDs such as Aleve, ibuprofen, naproxen, Motrin, Voltaren or Advil (even the topical ones)

## 2021-11-19 NOTE — Progress Notes (Signed)
Alexa Nichols, M.D. Gastroenterology & Hepatology [facility] [address] [place], [place] [address]  Primary Care Physician: [name], MD [place] [id number]  I will communicate my assessment and recommendations to the referring MD via EMR.  Problems: Gastric and duodenal AVMs Iron deficiency anemia secondary to AVMs Iron induced constipation  History of Present Illness: Alexa Nichols is a 76 y.o. female with past medical history of Alexa Nichols, recurrent iron deficiency anemia due to AVMs, COPD, asthma, depression, GERD, carcinoma of the breast, who presents for follow up of IDA.  The patient was last seen on 05/12/2021. At that time, the patient was continued on MiraLAX every day, ferrous sulfate daily and was recommended to have frequent H&H checks every month.Most recent available hemoglobin was from 05/26/2021 which showed a hemoglobin of 12.3.  Patient states feeling very well and denies having any complaints.  Denies any lightheadedness, syncope, melena, hematochezia. Takes iron supplementation intermittently. States her stool was darker previously but now is lighter. Her constipation has also improved after she stopped the iron.  Patient reports she has felt some stiffness in her lower extremities but she has made some improvement in her walking as she is not using a walker again. She states that she has not had a lot of energy after she had Alexa Nichols syndrome.  Has been taking Tylenol for pain, but no NSAIDs.  Is concerned that she has been having worsening osteoarthritis in her hands.  Is ssuposed to have blood workup with her PCP on Monday.  The patient denies having any nausea, vomiting, fever, chills, hematochezia, melena, hematemesis, abdominal distention, abdominal pain, diarrhea, jaundice, pruritus or weight loss.  Last EGD: push enteroscopy on 03/03/2021, was found to have 2 AVMs in the  gastric body with largest size of 8 mm which were ablated with APC.  There were also multiple polyps without presence of any inflammatory changes or recent bleeding.  The duodenum and proximal jejunum, as well as the esophagus were within normal limits.   Last Colonoscopy: 12/03/2020, 2 polyps resected from the cecum 3 polyps resected from the cecum with largest size of 7 mm, there was a polyp of 5 mm in the hepatic flexure, diverticulosis and external hemorrhoids.  Pathology of the polyps was consistent with a tubular adenoma.  She was recommended to have a repeat colonoscopy in 5 years  Past Medical History: Past Medical History:  Diagnosis Date   Asthma    Chronic low back pain    COPD (chronic obstructive pulmonary disease) (HCC)    Depression    GERD (gastroesophageal reflux disease)    Guillain Barr syndrome (HCC)    Heart murmur    Lobular carcinoma of left breast ([place]) 1997   in situ   Other and unspecified hyperlipidemia    Seasonal allergies     Past Surgical History: Past Surgical History:  Procedure Laterality Date   BIOPSY  12/03/2020   Procedure: BIOPSY;  Surgeon: Rogene Houston, MD;  [facility];  Service: Endoscopy;;  gastric polyps biopsies;   BREAST SURGERY Left    lumpectomy    CESAREAN SECTION  1986   CHOLECYSTECTOMY     COLONOSCOPY WITH PROPOFOL N/A 12/03/2020    two 4-7 mm polyps in cecum, one small polyp in cecum, one 5 mm polyp, sigmoid diverticulosis, path with tubular adenomas   ESOPHAGOGASTRODUODENOSCOPY (EGD) WITH PROPOFOL N/A 12/03/2020   normal esophagus, multiple gastric polyps s/p  biopsy, small paraesophageal hernia, normal duodenum. Fundic gland polyps.   ESOPHAGOGASTRODUODENOSCOPY (EGD) WITH PROPOFOL N/A 02/05/2021   Procedure: ESOPHAGOGASTRODUODENOSCOPY (EGD) WITH PROPOFOL;  Surgeon: [name], MD;  [facility];  Service: Endoscopy;  Laterality: N/A;   ESOPHAGOGASTRODUODENOSCOPY (EGD) WITH PROPOFOL N/A 03/03/2021   Procedure:  ESOPHAGOGASTRODUODENOSCOPY (EGD) WITH PROPOFOL;  Surgeon: [name], MD;  [facility];  Service: Gastroenterology;  Laterality: N/A;  with possible push enteroscopy utilizing the pediatric colonoscope   GIVENS CAPSULE STUDY N/A 12/16/2020   normal small bowel capsule but rapid transit time of 21 minutes. Recommended to resume alendronate.    GIVENS CAPSULE STUDY N/A 02/04/2021   Procedure: GIVENS CAPSULE STUDY;  Surgeon: Rogene Houston, MD;  [facility];  Service: Endoscopy;  Laterality: N/A;   HOT HEMOSTASIS  02/05/2021   Procedure: HOT HEMOSTASIS (ARGON PLASMA COAGULATION/BICAP);  Surgeon: [name], MD;  [facility];  Service: Endoscopy;;   HOT HEMOSTASIS  03/03/2021   Procedure: HOT HEMOSTASIS (ARGON PLASMA COAGULATION/BICAP);  Surgeon: [name], [name], MD;  [facility];  Service: Gastroenterology;;   TONSILLECTOMY     age 79    Family History: Family History  Problem Relation Age of Onset   Osteoporosis Mother    Other Father        head trauma   Breast cancer Sister        X's 2 sisters    Social History: Social History   Tobacco Use  Smoking Status Former   Packs/day: 0.50   Years: 20.00   Pack years: 10.00   Types: Cigarettes   Start date: 09/16/1964   Quit date: 10/05/1983   Years since quitting: 38.1  Smokeless Tobacco Never   Social History   Substance and Sexual Activity  Alcohol Use Never   Alcohol/week: 0.0 standard drinks   Social History   Substance and Sexual Activity  Drug Use Never    Allergies: Allergies  Allergen Reactions   Aspirin     samter's syndrome   Codeine Itching   Sulfa Antibiotics     Unknown reaction   Other Rash    gel filled fentanyl patch, adhesive on that patch caused ithcing    Medications: Current Outpatient Medications  Medication Sig Dispense Refill   acetaminophen (TYLENOL) 500 MG tablet Take 500 mg by mouth every 6 (six) hours as needed  for moderate pain or headache.     ALPRAZolam (XANAX) 0.25 MG tablet Take 1 tablet (0.25 mg total) by mouth daily as needed for anxiety or sleep. 10 tablet 2   ferrous sulfate 325 (65 FE) MG tablet Take 1 tablet (325 mg total) by mouth daily with breakfast. 90 tablet 3   montelukast (SINGULAIR) 10 MG tablet Take 10 mg by mouth at bedtime.     Multiple Vitamins-Minerals (PRESERVISION AREDS 2) CAPS Take 1 capsule by mouth in the morning and at bedtime.     pantoprazole (PROTONIX) 40 MG tablet Take 1 tablet (40 mg total) by mouth 2 (two) times daily. 180 tablet 0   Polyethylene Glycol 3350 (MIRALAX PO) Take by mouth. Bid     sertraline (ZOLOFT) 50 MG tablet Take 25 mg by mouth daily.     simvastatin (ZOCOR) 20 MG tablet Take 20 mg by mouth daily.     TRELEGY ELLIPTA 200-62.5-25 MCG/INH AEPB Inhale 1 puff into the lungs daily.     vitamin B-12 (CYANOCOBALAMIN) 500 MCG tablet Take 1 tablet (500 mcg  total) by mouth daily.     mirtazapine (REMERON) 7.5 MG tablet Take 7.5 mg by mouth at bedtime. (Patient not taking: Reported on 11/19/2021)     tiZANidine (ZANAFLEX) 4 MG capsule Take 4 mg by mouth 3 (three) times daily as needed for muscle spasms. (Patient not taking: Reported on 11/19/2021)     No current facility-administered medications for this visit.    Review of Systems: GENERAL: negative for malaise, night sweats HEENT: No changes in hearing or vision, no nose bleeds or other nasal problems. NECK: Negative for lumps, goiter, pain and significant neck swelling RESPIRATORY: Negative for cough, wheezing CARDIOVASCULAR: Negative for chest pain, leg swelling, palpitations, orthopnea GI: SEE HPI MUSCULOSKELETAL: Negative for joint pain or swelling, back pain, and muscle pain. SKIN: Negative for lesions, rash PSYCH: Negative for sleep disturbance, mood disorder and recent psychosocial stressors. HEMATOLOGY Negative for prolonged bleeding, bruising easily, and swollen nodes. ENDOCRINE: Negative for  cold or heat intolerance, polyuria, polydipsia and goiter. NEURO: negative for tremor, gait imbalance, syncope and seizures. The remainder of the review of systems is noncontributory.   Physical Exam: BP 137/66 (BP Location: Left Arm, Patient Position: Sitting, Cuff Size: Large)    Pulse 74    Temp 98.3 F (36.8 C) (Oral)    Ht 4\' 10"  (1.473 m)    Wt 142 lb 4.8 oz (64.5 kg)    BMI 29.74 kg/m  GENERAL: The patient is AO x3, in no acute distress. HEENT: Head is normocephalic and atraumatic. EOMI are intact. Mouth is well hydrated and without lesions. NECK: Supple. No masses LUNGS: Clear to auscultation. No presence of rhonchi/wheezing/rales. Adequate chest expansion HEART: RRR, normal s1 and s2. ABDOMEN: Soft, nontender, no guarding, no peritoneal signs, and nondistended. BS +. No masses. EXTREMITIES: Without any cyanosis, clubbing, rash, lesions or edema. NEUROLOGIC: AOx3, no focal motor deficit. SKIN: no jaundice, no rashes  Imaging/Labs: as above  I personally reviewed and interpreted the available labs, imaging and endoscopic files.  Impression and Plan: Alexa Nichols is a 76 y.o. female with past medical history of Alexa Nichols, recurrent iron deficiency anemia due to AVMs, COPD, asthma, depression, GERD, carcinoma of the breast, who presents for follow up of IDA.  The patient had evidence of both endoscopic improvement after ablation of gastrointestinal AVMs, but also response to medical management with oral iron given the normalization of her hemoglobin on most recent labs.  As she has remained asymptomatic and is feeling good spirits, I consider she has presented clinical stability.  She will will follow with her PCP regarding her surveillance labs including a CBC and iron stores.  If she is found to have worsening or recurrent anemia or low iron stores, she will need to restart taking the oral iron on a daily basis.  She will also avoid taking NSAIDs to avoid any further  gastrointestinal injury.  - Follow up with Dr. Woody Seller to check CBC and iron stores - If worsening iron deficiency restart oral iron daily - Avoid using high dose aspirin including Goody/BC powders, NSAIDs such as Aleve, ibuprofen, naproxen, Motrin, Voltaren or Advil (even the topical ones)  All questions were answered.      [name], MD Gastroenterology and Hepatology [facility]

## 2021-11-23 ENCOUNTER — Ambulatory Visit (INDEPENDENT_AMBULATORY_CARE_PROVIDER_SITE_OTHER): Payer: Medicare Other | Admitting: Ophthalmology

## 2021-11-23 ENCOUNTER — Other Ambulatory Visit: Payer: Self-pay

## 2021-11-23 ENCOUNTER — Encounter (INDEPENDENT_AMBULATORY_CARE_PROVIDER_SITE_OTHER): Payer: Self-pay | Admitting: Ophthalmology

## 2021-11-23 DIAGNOSIS — Z79899 Other long term (current) drug therapy: Secondary | ICD-10-CM | POA: Diagnosis not present

## 2021-11-23 DIAGNOSIS — Z6829 Body mass index (BMI) 29.0-29.9, adult: Secondary | ICD-10-CM | POA: Diagnosis not present

## 2021-11-23 DIAGNOSIS — Z299 Encounter for prophylactic measures, unspecified: Secondary | ICD-10-CM | POA: Diagnosis not present

## 2021-11-23 DIAGNOSIS — Z7189 Other specified counseling: Secondary | ICD-10-CM | POA: Diagnosis not present

## 2021-11-23 DIAGNOSIS — I10 Essential (primary) hypertension: Secondary | ICD-10-CM | POA: Diagnosis not present

## 2021-11-23 DIAGNOSIS — Z0000 Encounter for general adult medical examination without abnormal findings: Secondary | ICD-10-CM | POA: Diagnosis not present

## 2021-11-23 DIAGNOSIS — Z1339 Encounter for screening examination for other mental health and behavioral disorders: Secondary | ICD-10-CM | POA: Diagnosis not present

## 2021-11-23 DIAGNOSIS — E7800 Pure hypercholesterolemia, unspecified: Secondary | ICD-10-CM | POA: Diagnosis not present

## 2021-11-23 DIAGNOSIS — F419 Anxiety disorder, unspecified: Secondary | ICD-10-CM | POA: Diagnosis not present

## 2021-11-23 DIAGNOSIS — Z1331 Encounter for screening for depression: Secondary | ICD-10-CM | POA: Diagnosis not present

## 2021-11-23 DIAGNOSIS — H353122 Nonexudative age-related macular degeneration, left eye, intermediate dry stage: Secondary | ICD-10-CM

## 2021-11-23 DIAGNOSIS — H353211 Exudative age-related macular degeneration, right eye, with active choroidal neovascularization: Secondary | ICD-10-CM | POA: Diagnosis not present

## 2021-11-23 DIAGNOSIS — R5383 Other fatigue: Secondary | ICD-10-CM | POA: Diagnosis not present

## 2021-11-23 MED ORDER — AFLIBERCEPT 2MG/0.05ML IZ SOLN FOR KALEIDOSCOPE
2.0000 mg | INTRAVITREAL | Status: AC | PRN
  Administered 2021-11-23: 2 mg via INTRAVITREAL

## 2021-11-23 NOTE — Assessment & Plan Note (Signed)
OS no sign of CNVM today, patient to continue to monitor for new onset visual acuity distortions

## 2021-11-23 NOTE — Assessment & Plan Note (Signed)
OD today at 6-week interval, with less active subfoveal scarring and less active intraretinal fluid on Eylea.  Repeat injection today and consider examination next in 7-week

## 2021-11-23 NOTE — Progress Notes (Signed)
11/23/2021     CHIEF COMPLAINT Patient presents for  Chief Complaint  Patient presents with   Macular Degeneration      HISTORY OF PRESENT ILLNESS: Alexa Nichols is a 76 y.o. female who presents to the clinic today for:   HPI   6 weeks OD, Eylea OCT. Patient states vision is stable and unchanged since last visit. Denies any new floaters or FOL.  Last edited by Laurin Coder on 11/23/2021  1:40 PM.      Referring physician: [name], MD [place],  [place] [id number]  HISTORICAL INFORMATION:   Selected notes from the MEDICAL RECORD NUMBER    Lab Results  Component Value Date   HGBA1C 4.6 (L) 02/17/2021     CURRENT MEDICATIONS: No current outpatient medications on file. (Ophthalmic Drugs)   No current facility-administered medications for this visit. (Ophthalmic Drugs)   Current Outpatient Medications (Other)  Medication Sig   acetaminophen (TYLENOL) 500 MG tablet Take 500 mg by mouth every 6 (six) hours as needed for moderate pain or headache.   ALPRAZolam (XANAX) 0.25 MG tablet Take 1 tablet (0.25 mg total) by mouth daily as needed for anxiety or sleep.   ferrous sulfate 325 (65 FE) MG tablet Take 1 tablet (325 mg total) by mouth daily with breakfast.   mirtazapine (REMERON) 7.5 MG tablet Take 7.5 mg by mouth at bedtime. (Patient not taking: Reported on 11/19/2021)   montelukast (SINGULAIR) 10 MG tablet Take 10 mg by mouth at bedtime.   Multiple Vitamins-Minerals (PRESERVISION AREDS 2) CAPS Take 1 capsule by mouth in the morning and at bedtime.   pantoprazole (PROTONIX) 40 MG tablet Take 1 tablet (40 mg total) by mouth 2 (two) times daily.   Polyethylene Glycol 3350 (MIRALAX PO) Take by mouth. Bid   sertraline (ZOLOFT) 50 MG tablet Take 25 mg by mouth daily.   simvastatin (ZOCOR) 20 MG tablet Take 20 mg by mouth daily.   tiZANidine (ZANAFLEX) 4 MG capsule Take 4 mg by mouth 3 (three) times daily as needed for muscle spasms. (Patient not taking:  Reported on 11/19/2021)   TRELEGY ELLIPTA 200-62.5-25 MCG/INH AEPB Inhale 1 puff into the lungs daily.   vitamin B-12 (CYANOCOBALAMIN) 500 MCG tablet Take 1 tablet (500 mcg total) by mouth daily.   No current facility-administered medications for this visit. (Other)      REVIEW OF SYSTEMS: ROS   Negative for: Constitutional, Gastrointestinal, Neurological, Skin, Genitourinary, Musculoskeletal, HENT, Endocrine, Cardiovascular, Eyes, Respiratory, Psychiatric, Allergic/Imm, Heme/Lymph Last edited by Hurman Horn, MD on 11/23/2021  2:22 PM.       ALLERGIES Allergies  Allergen Reactions   Aspirin     samter's syndrome   Codeine Itching   Sulfa Antibiotics     Unknown reaction   Other Rash    gel filled fentanyl patch, adhesive on that patch caused ithcing    PAST MEDICAL HISTORY Past Medical History:  Diagnosis Date   Asthma    Chronic low back pain    COPD (chronic obstructive pulmonary disease) (HCC)    Depression    GERD (gastroesophageal reflux disease)    Guillain Barr syndrome (HCC)    Heart murmur    Lobular carcinoma of left breast ([place]) 1997   in situ   Other and unspecified hyperlipidemia    Seasonal allergies    Past Surgical History:  Procedure Laterality Date   BIOPSY  12/03/2020   Procedure: BIOPSY;  Surgeon: Rogene Houston, MD;  [facility];  Service: Endoscopy;;  gastric polyps biopsies;   BREAST SURGERY Left    lumpectomy    CESAREAN SECTION  1986   CHOLECYSTECTOMY     COLONOSCOPY WITH PROPOFOL N/A 12/03/2020    two 4-7 mm polyps in cecum, one small polyp in cecum, one 5 mm polyp, sigmoid diverticulosis, path with tubular adenomas   ESOPHAGOGASTRODUODENOSCOPY (EGD) WITH PROPOFOL N/A 12/03/2020   normal esophagus, multiple gastric polyps s/p biopsy, small paraesophageal hernia, normal duodenum. Fundic gland polyps.   ESOPHAGOGASTRODUODENOSCOPY (EGD) WITH PROPOFOL N/A 02/05/2021   Procedure: ESOPHAGOGASTRODUODENOSCOPY (EGD) WITH PROPOFOL;   Surgeon: [name], MD;  [facility];  Service: Endoscopy;  Laterality: N/A;   ESOPHAGOGASTRODUODENOSCOPY (EGD) WITH PROPOFOL N/A 03/03/2021   Procedure: ESOPHAGOGASTRODUODENOSCOPY (EGD) WITH PROPOFOL;  Surgeon: [name], MD;  [facility];  Service: Gastroenterology;  Laterality: N/A;  with possible push enteroscopy utilizing the pediatric colonoscope   GIVENS CAPSULE STUDY N/A 12/16/2020   normal small bowel capsule but rapid transit time of 21 minutes. Recommended to resume alendronate.    GIVENS CAPSULE STUDY N/A 02/04/2021   Procedure: GIVENS CAPSULE STUDY;  Surgeon: Rogene Houston, MD;  [facility];  Service: Endoscopy;  Laterality: N/A;   HOT HEMOSTASIS  02/05/2021   Procedure: HOT HEMOSTASIS (ARGON PLASMA COAGULATION/BICAP);  Surgeon: [name], MD;  [facility];  Service: Endoscopy;;   HOT HEMOSTASIS  03/03/2021   Procedure: HOT HEMOSTASIS (ARGON PLASMA COAGULATION/BICAP);  Surgeon: [name], [name], MD;  [facility];  Service: Gastroenterology;;   TONSILLECTOMY     age 52    FAMILY HISTORY Family History  Problem Relation Age of Onset   Osteoporosis Mother    Other Father        head trauma   Breast cancer Sister        X's 2 sisters    SOCIAL HISTORY Social History   Tobacco Use   Smoking status: Former    Packs/day: 0.50    Years: 20.00    Pack years: 10.00    Types: Cigarettes    Start date: 09/16/1964    Quit date: 10/05/1983    Years since quitting: 38.1   Smokeless tobacco: Never  Vaping Use   Vaping Use: Never used  Substance Use Topics   Alcohol use: Never    Alcohol/week: 0.0 standard drinks   Drug use: Never         OPHTHALMIC EXAM:  Base Eye Exam     Visual Acuity (ETDRS)       Right Left   Dist [place] 20/125 20/25 +2   Dist ph [place] NI     Correction: Glasses         Tonometry (Tonopen, 1:45 PM)       Right Left   Pressure 15 12          Pupils       Pupils Dark Light APD   Right PERRL 5 4 None   Left PERRL 5 4 None         Extraocular Movement       Right Left    Full Full         Neuro/Psych     Oriented x3: Yes   Mood/Affect: Normal         Dilation     Right eye: 1.0% Mydriacyl, 2.5% Phenylephrine @ 1:45 PM  Slit Lamp and Fundus Exam     External Exam       Right Left   External Normal Normal         Slit Lamp Exam       Right Left   Lids/Lashes Normal Normal   Conjunctiva/Sclera White and quiet White and quiet   Cornea Clear Clear   Anterior Chamber Deep and quiet Deep and quiet   Iris Round and reactive Round and reactive   Lens Posterior chamber intraocular lens Posterior chamber intraocular lens   Anterior Vitreous Normal Normal         Fundus Exam       Right Left   Posterior Vitreous Clear vitrectomized Normal   Disc Normal Normal   C/D Ratio 0.2 0.2   Macula Macular thickening, Hard drusen, Retinal pigment epithelial rip.   Retinal pigment epithelial detachment, Intermediate age related macular degeneration, no hemorrhage Early age related macular degeneration, no macular thickening, no microaneurysms, Hard drusen   Vessels Normal Normal   Periphery Normal Normal            IMAGING AND PROCEDURES  Imaging and Procedures for 11/23/21  Intravitreal Injection, Pharmacologic Agent - OD - Right Eye       Time Out 11/23/2021. 2:23 PM. Confirmed correct patient, procedure, site, and patient consented.   Anesthesia Topical anesthesia was used. Anesthetic medications included Lidocaine 4%.   Procedure Preparation included Tobramycin 0.3%, 10% betadine to eyelids, 5% betadine to ocular surface. A 30 gauge needle was used.   Injection: 2 mg aflibercept 2 MG/0.05ML   Route: Intravitreal, Site: Right Eye   NDC: [id number], Lot: [id number], Waste: 0 mL   Post-op Post injection exam found visual acuity of at least counting fingers. The patient  tolerated the procedure well. There were no complications. The patient received written and verbal post procedure care education. Post injection medications included ocuflox.      OCT, Retina - OU - Both Eyes       Right Eye Quality was good. Scan locations included subfoveal. Central Foveal Thickness: 356. Progression has improved. Findings include abnormal foveal contour, outer retinal tubulation, subretinal hyper-reflective material, disciform scar, choroidal neovascular membrane, no IRF.   Left Eye Quality was good. Scan locations included subfoveal. Central Foveal Thickness: 308. Progression has been stable. Findings include retinal drusen , normal foveal contour, no IRF, no SRF.   Notes Recurrent CNVM yet now overall improving macular findings and anatomy we will need to repeat injection today as macular anatomy has improved again at the interval follow-up of 6 weeks.  We will extend interval next to 7 weeks             ASSESSMENT/PLAN:  Intermediate stage nonexudative age-related macular degeneration of left eye OS no sign of CNVM today, patient to continue to monitor for new onset visual acuity distortions  Exudative age-related macular degeneration of right eye with active choroidal neovascularization ([place]) OD today at 6-week interval, with less active subfoveal scarring and less active intraretinal fluid on Eylea.  Repeat injection today and consider examination next in 7-week     ICD-10-CM   1. Exudative age-related macular degeneration of right eye with active choroidal neovascularization (HCC)  H35.3211 Intravitreal Injection, Pharmacologic Agent - OD - Right Eye    OCT, Retina - OU - Both Eyes    aflibercept (EYLEA) SOLN 2 mg    2. Intermediate stage nonexudative age-related macular degeneration of left eye  H35.3122  1.  OD, chronic active subfoveal CNVM responsive to Eylea today at 6-week interval.  Repeat injection today follow-up next in 7  weeks  2.  3.  Ophthalmic Meds Ordered this visit:  Meds ordered this encounter  Medications   aflibercept (EYLEA) SOLN 2 mg       Return in about 7 weeks (around 01/11/2022) for dilate, OD, EYLEA OCT.  There are no Patient Instructions on file for this visit.   Explained the diagnoses, plan, and follow up with the patient and they expressed understanding.  Patient expressed understanding of the importance of proper follow up care.   Clent Demark Ranferi Clingan M.D. Diseases & Surgery of the Retina and Vitreous [facility] [place] 11/23/21     Abbreviations: M myopia (nearsighted); A astigmatism; H hyperopia (farsighted); P presbyopia; Mrx spectacle prescription;  CTL contact lenses; OD right eye; OS left eye; OU both eyes  XT exotropia; ET esotropia; PEK punctate epithelial keratitis; PEE punctate epithelial erosions; DES dry eye syndrome; MGD meibomian gland dysfunction; ATs artificial tears; PFAT's preservative free artificial tears; [place] nuclear sclerotic cataract; PSC posterior subcapsular cataract; ERM epi-retinal membrane; PVD posterior vitreous detachment; RD retinal detachment; DM diabetes mellitus; DR diabetic retinopathy; NPDR non-proliferative diabetic retinopathy; PDR proliferative diabetic retinopathy; CSME clinically significant macular edema; DME diabetic macular edema; dbh dot blot hemorrhages; CWS cotton wool spot; POAG primary open angle glaucoma; C/D cup-to-disc ratio; HVF humphrey visual field; GVF goldmann visual field; OCT optical coherence tomography; IOP intraocular pressure; BRVO Branch retinal vein occlusion; CRVO central retinal vein occlusion; CRAO central retinal artery occlusion; BRAO branch retinal artery occlusion; RT retinal tear; SB scleral buckle; PPV pars plana vitrectomy; VH Vitreous hemorrhage; PRP panretinal laser photocoagulation; IVK intravitreal kenalog; VMT vitreomacular traction; MH Macular hole;  NVD neovascularization of the disc; NVE  neovascularization elsewhere; AREDS age related eye disease study; ARMD age related macular degeneration; POAG primary open angle glaucoma; EBMD epithelial/anterior basement membrane dystrophy; ACIOL anterior chamber intraocular lens; IOL intraocular lens; PCIOL posterior chamber intraocular lens; Phaco/IOL phacoemulsification with intraocular lens placement; [place] photorefractive keratectomy; LASIK laser assisted in situ keratomileusis; HTN hypertension; DM diabetes mellitus; COPD chronic obstructive pulmonary disease

## 2021-12-11 DIAGNOSIS — Z1231 Encounter for screening mammogram for malignant neoplasm of breast: Secondary | ICD-10-CM | POA: Diagnosis not present

## 2022-01-11 ENCOUNTER — Ambulatory Visit (INDEPENDENT_AMBULATORY_CARE_PROVIDER_SITE_OTHER): Payer: Medicare Other | Admitting: Ophthalmology

## 2022-01-11 ENCOUNTER — Encounter (INDEPENDENT_AMBULATORY_CARE_PROVIDER_SITE_OTHER): Payer: Self-pay | Admitting: Ophthalmology

## 2022-01-11 DIAGNOSIS — H353211 Exudative age-related macular degeneration, right eye, with active choroidal neovascularization: Secondary | ICD-10-CM

## 2022-01-11 DIAGNOSIS — H353121 Nonexudative age-related macular degeneration, left eye, early dry stage: Secondary | ICD-10-CM | POA: Diagnosis not present

## 2022-01-11 DIAGNOSIS — H353122 Nonexudative age-related macular degeneration, left eye, intermediate dry stage: Secondary | ICD-10-CM | POA: Diagnosis not present

## 2022-01-11 MED ORDER — AFLIBERCEPT 2MG/0.05ML IZ SOLN FOR KALEIDOSCOPE
2.0000 mg | INTRAVITREAL | Status: AC | PRN
  Administered 2022-01-11: 2 mg via INTRAVITREAL

## 2022-01-11 NOTE — Assessment & Plan Note (Deleted)
Noticed.

## 2022-01-11 NOTE — Assessment & Plan Note (Signed)
Slight increase in disease activity recurrent at 7-week interval today on Eylea.  We will repeat injection today to prevent enlargement of scotoma follow-up next in 6 weeks ?

## 2022-01-11 NOTE — Progress Notes (Signed)
? ? ?01/11/2022 ? ?  ? ?CHIEF COMPLAINT ?Patient presents for  ?Chief Complaint  ?Patient presents with  ? Macular Degeneration  ? ? ? ? ?HISTORY OF PRESENT ILLNESS: ?Digna Countess is a 76 y.o. female who presents to the clinic today for:  ? ?HPI   ?7 week fu OD oct Eylea OD. ?Patient states vision is stable and unchanged since last visit. Denies any new floaters or FOL. ? ?Last edited by Laurin Coder on 01/11/2022  1:27 PM.  ?  ? ? ?Referring physician: ?[name], MD ?[address] ?[place],  [place] [address] ? ?HISTORICAL INFORMATION:  ? ?Selected notes from the [facility] [place] ?  ? ?Lab Results  ?Component Value Date  ? HGBA1C 4.6 (L) 02/17/2021  ?  ? ?CURRENT MEDICATIONS: ?No current outpatient medications on file. (Ophthalmic Drugs)  ? ?No current facility-administered medications for this visit. (Ophthalmic Drugs)  ? ?Current Outpatient Medications (Other)  ?Medication Sig  ? acetaminophen (TYLENOL) 500 MG tablet Take 500 mg by mouth every 6 (six) hours as needed for moderate pain or headache.  ? ALPRAZolam (XANAX) 0.25 MG tablet Take 1 tablet (0.25 mg total) by mouth daily as needed for anxiety or sleep.  ? ferrous sulfate 325 (65 FE) MG tablet Take 1 tablet (325 mg total) by mouth daily with breakfast.  ? mirtazapine (REMERON) 7.5 MG tablet Take 7.5 mg by mouth at bedtime. (Patient not taking: Reported on 11/19/2021)  ? montelukast (SINGULAIR) 10 MG tablet Take 10 mg by mouth at bedtime.  ? Multiple Vitamins-Minerals (PRESERVISION AREDS 2) CAPS Take 1 capsule by mouth in the morning and at bedtime.  ? pantoprazole (PROTONIX) 40 MG tablet Take 1 tablet (40 mg total) by mouth 2 (two) times daily.  ? Polyethylene Glycol 3350 (MIRALAX PO) Take by mouth. Bid  ? sertraline (ZOLOFT) 50 MG tablet Take 25 mg by mouth daily.  ? simvastatin (ZOCOR) 20 MG tablet Take 20 mg by mouth daily.  ? tiZANidine (ZANAFLEX) 4 MG capsule Take 4 mg by mouth 3 (three) times daily as needed for muscle spasms. (Patient not taking:  Reported on 11/19/2021)  ? TRELEGY ELLIPTA 200-62.5-25 MCG/INH AEPB Inhale 1 puff into the lungs daily.  ? vitamin B-12 (CYANOCOBALAMIN) 500 MCG tablet Take 1 tablet (500 mcg total) by mouth daily.  ? ?No current facility-administered medications for this visit. (Other)  ? ? ? ? ?REVIEW OF SYSTEMS: ?ROS   ?Negative for: Constitutional, Gastrointestinal, Neurological, Skin, Genitourinary, Musculoskeletal, HENT, Endocrine, Cardiovascular, Eyes, Respiratory, Psychiatric, Allergic/Imm, Heme/Lymph ?Last edited by Hurman Horn, MD on 01/11/2022  1:56 PM.  ?  ? ? ? ?ALLERGIES ?Allergies  ?Allergen Reactions  ? Aspirin   ?  samter's syndrome  ? Codeine Itching  ? Sulfa Antibiotics   ?  Unknown reaction  ? Other Rash  ?  gel filled fentanyl patch, adhesive on that patch caused ithcing  ? ? ?PAST MEDICAL HISTORY ?Past Medical History:  ?Diagnosis Date  ? Asthma   ? Chronic low back pain   ? COPD (chronic obstructive pulmonary disease) ([place])   ? Depression   ? GERD (gastroesophageal reflux disease)   ? Guillain Barr? syndrome ([place])   ? Heart murmur   ? Lobular carcinoma of left breast ([place]) 1997  ? in situ  ? Other and unspecified hyperlipidemia   ? Seasonal allergies   ? ?Past Surgical History:  ?Procedure Laterality Date  ? BIOPSY  12/03/2020  ? Procedure: BIOPSY;  Surgeon: Hildred Laser  U, MD;  [facility];  Service: Endoscopy;;  gastric polyps biopsies;  ? BREAST SURGERY Left   ? lumpectomy   ? [place]  ? CHOLECYSTECTOMY    ? COLONOSCOPY WITH PROPOFOL N/A 12/03/2020  ?  two 4-7 mm polyps in cecum, one small polyp in cecum, one 5 mm polyp, sigmoid diverticulosis, path with tubular adenomas  ? ESOPHAGOGASTRODUODENOSCOPY (EGD) WITH PROPOFOL N/A 12/03/2020  ? normal esophagus, multiple gastric polyps s/p biopsy, small paraesophageal hernia, normal duodenum. Fundic gland polyps.  ? ESOPHAGOGASTRODUODENOSCOPY (EGD) WITH PROPOFOL N/A 02/05/2021  ? Procedure: ESOPHAGOGASTRODUODENOSCOPY (EGD) WITH PROPOFOL;   Surgeon: [name], MD;  [facility];  Service: Endoscopy;  Laterality: N/A;  ? ESOPHAGOGASTRODUODENOSCOPY (EGD) WITH PROPOFOL N/A 03/03/2021  ? Procedure: ESOPHAGOGASTRODUODENOSCOPY (EGD) WITH PROPOFOL;  Surgeon: [name], MD;  [facility];  Service: Gastroenterology;  Laterality: N/A;  with possible push enteroscopy utilizing the pediatric colonoscope  ? GIVENS CAPSULE STUDY N/A 12/16/2020  ? normal small bowel capsule but rapid transit time of 21 minutes. Recommended to resume alendronate.   ? GIVENS CAPSULE STUDY N/A 02/04/2021  ? Procedure: GIVENS CAPSULE STUDY;  Surgeon: Rogene Houston, MD;  [facility];  Service: Endoscopy;  Laterality: N/A;  ? HOT HEMOSTASIS  02/05/2021  ? Procedure: HOT HEMOSTASIS (ARGON PLASMA COAGULATION/BICAP);  Surgeon: [name], MD;  [facility];  Service: Endoscopy;;  ? HOT HEMOSTASIS  03/03/2021  ? Procedure: HOT HEMOSTASIS (ARGON PLASMA COAGULATION/BICAP);  Surgeon: [name], [name], MD;  [facility];  Service: Gastroenterology;;  ? TONSILLECTOMY    ? age 50  ? ? ?FAMILY HISTORY ?Family History  ?Problem Relation Age of Onset  ? Osteoporosis Mother   ? Other Father   ?     head trauma  ? Breast cancer Sister   ?     X's 2 sisters  ? ? ?SOCIAL HISTORY ?Social History  ? ?Tobacco Use  ? Smoking status: Former  ?  Packs/day: 0.50  ?  Years: 20.00  ?  Pack years: 10.00  ?  Types: Cigarettes  ?  Start date: 09/16/1964  ?  Quit date: 10/05/1983  ?  Years since quitting: 38.2  ? Smokeless tobacco: Never  ?Vaping Use  ? Vaping Use: Never used  ?Substance Use Topics  ? Alcohol use: Never  ?  Alcohol/week: 0.0 standard drinks  ? Drug use: Never  ? ?  ? ?  ? ?OPHTHALMIC EXAM: ? ?Base Eye Exam   ? ? Visual Acuity (ETDRS)   ? ?   Right Left  ? Dist cc 20/160 20/25 -1  ? Dist ph cc NI   ? ? Correction: Glasses  ? ?  ?  ? ? Tonometry (Tonopen, 1:28 PM)   ? ?   Right Left  ? Pressure 9 8  ? ?  ?  ? ? Pupils    ? ?   Pupils Dark Light Shape React APD  ? Right PERRL 6 5 Round Brisk None  ? Left PERRL 5 4 Round Brisk None  ? ?  ?  ? ? Visual Fields (Counting fingers)   ? ?   Left Right  ?  Full   ? ?  ?  ? ? Extraocular Movement   ? ?   Right Left  ?  Full Full  ? ?  ?  ? ? Neuro/Psych   ? ? Oriented x3: Yes  ?  Mood/Affect: Normal  ? ?  ?  ? ? Dilation   ? ? Right eye: 1.0% Mydriacyl, 2.5% Phenylephrine @ 1:28 PM  ? ?  ?  ? ?  ? ?Slit Lamp and Fundus Exam   ? ? External Exam   ? ?   Right Left  ? External Normal Normal  ? ?  ?  ? ? Slit Lamp Exam   ? ?   Right Left  ? Lids/Lashes Normal Normal  ? Conjunctiva/Sclera White and quiet White and quiet  ? Cornea Clear Clear  ? Anterior Chamber Deep and quiet Deep and quiet  ? Iris Round and reactive Round and reactive  ? Lens Posterior chamber intraocular lens Posterior chamber intraocular lens  ? Anterior Vitreous Normal Normal  ? ?  ?  ? ? Fundus Exam   ? ?   Right Left  ? Posterior Vitreous Clear vitrectomized   ? Disc Normal   ? C/D Ratio 0.2   ? Macula Macular thickening, Hard drusen, Retinal pigment epithelial rip.   Retinal pigment epithelial detachment, Intermediate age related macular degeneration, no hemorrhage   ? Vessels Normal   ? Periphery Normal   ? ?  ?  ? ?  ? ? ?IMAGING AND PROCEDURES  ?Imaging and Procedures for 01/11/22 ? ?Intravitreal Injection, Pharmacologic Agent - OD - Right Eye   ? ?   ?Time Out ?01/11/2022. 2:00 PM. Confirmed correct patient, procedure, site, and patient consented.  ? ?Anesthesia ?Topical anesthesia was used. Anesthetic medications included Lidocaine 4%.  ? ?Procedure ?Preparation included Tobramycin 0.3%, 10% betadine to eyelids, 5% betadine to ocular surface. A 30 gauge needle was used.  ? ?Injection: ?2 mg aflibercept 2 MG/0.05ML ?  Route: Intravitreal, Site: Right Eye ?  [place]: [id number], Lot: [id number], Waste: 0 mL  ? ?Post-op ?Post injection exam found visual acuity of at least counting fingers. The patient tolerated the procedure  well. There were no complications. The patient received written and verbal post procedure care education. Post injection medications included ocuflox.  ? ?  ? ?OCT, Retina - OU - Both Eyes   ? ?   ?Right

## 2022-01-11 NOTE — Assessment & Plan Note (Signed)
No sign of wet AMD OS ?

## 2022-02-01 ENCOUNTER — Inpatient Hospital Stay (HOSPITAL_COMMUNITY)
Admission: EM | Admit: 2022-02-01 | Discharge: 2022-02-03 | DRG: 369 | Disposition: A | Discharge status: Home / Self Care | Payer: Medicare Other | Attending: Family Medicine | Admitting: Family Medicine

## 2022-02-01 ENCOUNTER — Encounter (HOSPITAL_COMMUNITY): Payer: Self-pay

## 2022-02-01 ENCOUNTER — Other Ambulatory Visit: Payer: Self-pay

## 2022-02-01 DIAGNOSIS — D72829 Elevated white blood cell count, unspecified: Secondary | ICD-10-CM | POA: Diagnosis present

## 2022-02-01 DIAGNOSIS — J449 Chronic obstructive pulmonary disease, unspecified: Secondary | ICD-10-CM | POA: Diagnosis present

## 2022-02-01 DIAGNOSIS — I509 Heart failure, unspecified: Secondary | ICD-10-CM | POA: Diagnosis not present

## 2022-02-01 DIAGNOSIS — K922 Gastrointestinal hemorrhage, unspecified: Secondary | ICD-10-CM | POA: Diagnosis not present

## 2022-02-01 DIAGNOSIS — Z803 Family history of malignant neoplasm of breast: Secondary | ICD-10-CM

## 2022-02-01 DIAGNOSIS — Z853 Personal history of malignant neoplasm of breast: Secondary | ICD-10-CM

## 2022-02-01 DIAGNOSIS — Z888 Allergy status to other drugs, medicaments and biological substances status: Secondary | ICD-10-CM

## 2022-02-01 DIAGNOSIS — Z8262 Family history of osteoporosis: Secondary | ICD-10-CM

## 2022-02-01 DIAGNOSIS — Z87891 Personal history of nicotine dependence: Secondary | ICD-10-CM | POA: Diagnosis not present

## 2022-02-01 DIAGNOSIS — Z79899 Other long term (current) drug therapy: Secondary | ICD-10-CM

## 2022-02-01 DIAGNOSIS — F419 Anxiety disorder, unspecified: Secondary | ICD-10-CM | POA: Diagnosis present

## 2022-02-01 DIAGNOSIS — G8929 Other chronic pain: Secondary | ICD-10-CM | POA: Diagnosis present

## 2022-02-01 DIAGNOSIS — K5521 Angiodysplasia of colon with hemorrhage: Secondary | ICD-10-CM | POA: Diagnosis present

## 2022-02-01 DIAGNOSIS — M545 Low back pain, unspecified: Secondary | ICD-10-CM | POA: Diagnosis present

## 2022-02-01 DIAGNOSIS — E785 Hyperlipidemia, unspecified: Secondary | ICD-10-CM | POA: Diagnosis present

## 2022-02-01 DIAGNOSIS — D62 Acute posthemorrhagic anemia: Secondary | ICD-10-CM | POA: Diagnosis present

## 2022-02-01 DIAGNOSIS — F32A Depression, unspecified: Secondary | ICD-10-CM | POA: Diagnosis present

## 2022-02-01 DIAGNOSIS — K31811 Angiodysplasia of stomach and duodenum with bleeding: Secondary | ICD-10-CM | POA: Diagnosis not present

## 2022-02-01 DIAGNOSIS — I110 Hypertensive heart disease with heart failure: Secondary | ICD-10-CM | POA: Diagnosis not present

## 2022-02-01 DIAGNOSIS — K5520 Angiodysplasia of colon without hemorrhage: Secondary | ICD-10-CM | POA: Diagnosis not present

## 2022-02-01 DIAGNOSIS — Z885 Allergy status to narcotic agent status: Secondary | ICD-10-CM

## 2022-02-01 DIAGNOSIS — K226 Gastro-esophageal laceration-hemorrhage syndrome: Principal | ICD-10-CM | POA: Diagnosis present

## 2022-02-01 DIAGNOSIS — E7849 Other hyperlipidemia: Secondary | ICD-10-CM | POA: Diagnosis not present

## 2022-02-01 DIAGNOSIS — R531 Weakness: Secondary | ICD-10-CM | POA: Diagnosis not present

## 2022-02-01 DIAGNOSIS — R5381 Other malaise: Secondary | ICD-10-CM | POA: Diagnosis present

## 2022-02-01 DIAGNOSIS — D649 Anemia, unspecified: Secondary | ICD-10-CM | POA: Diagnosis not present

## 2022-02-01 DIAGNOSIS — K5900 Constipation, unspecified: Secondary | ICD-10-CM | POA: Diagnosis present

## 2022-02-01 DIAGNOSIS — Z9049 Acquired absence of other specified parts of digestive tract: Secondary | ICD-10-CM | POA: Diagnosis not present

## 2022-02-01 DIAGNOSIS — R739 Hyperglycemia, unspecified: Secondary | ICD-10-CM | POA: Diagnosis not present

## 2022-02-01 DIAGNOSIS — K219 Gastro-esophageal reflux disease without esophagitis: Secondary | ICD-10-CM | POA: Diagnosis not present

## 2022-02-01 DIAGNOSIS — D509 Iron deficiency anemia, unspecified: Secondary | ICD-10-CM | POA: Diagnosis present

## 2022-02-01 DIAGNOSIS — K31819 Angiodysplasia of stomach and duodenum without bleeding: Secondary | ICD-10-CM | POA: Diagnosis not present

## 2022-02-01 DIAGNOSIS — R195 Other fecal abnormalities: Secondary | ICD-10-CM | POA: Diagnosis present

## 2022-02-01 DIAGNOSIS — K920 Hematemesis: Secondary | ICD-10-CM | POA: Diagnosis not present

## 2022-02-01 LAB — CBC WITH DIFFERENTIAL/PLATELET
Abs Immature Granulocytes: 0.33 10*3/uL — ABNORMAL HIGH (ref 0.00–0.07)
Basophils Absolute: 0.1 10*3/uL (ref 0.0–0.1)
Basophils Relative: 0 %
Eosinophils Absolute: 0.1 10*3/uL (ref 0.0–0.5)
Eosinophils Relative: 1 %
HCT: 28.4 % — ABNORMAL LOW (ref 36.0–46.0)
Hemoglobin: 9.1 g/dL — ABNORMAL LOW (ref 12.0–15.0)
Immature Granulocytes: 2 %
Lymphocytes Relative: 13 %
Lymphs Abs: 2.2 10*3/uL (ref 0.7–4.0)
MCH: 30.7 pg (ref 26.0–34.0)
MCHC: 32 g/dL (ref 30.0–36.0)
MCV: 95.9 fL (ref 80.0–100.0)
Monocytes Absolute: 0.7 10*3/uL (ref 0.1–1.0)
Monocytes Relative: 4 %
Neutro Abs: 14.3 10*3/uL — ABNORMAL HIGH (ref 1.7–7.7)
Neutrophils Relative %: 80 %
Platelets: 240 10*3/uL (ref 150–400)
RBC: 2.96 MIL/uL — ABNORMAL LOW (ref 3.87–5.11)
RDW: 13.2 % (ref 11.5–15.5)
WBC: 17.8 10*3/uL — ABNORMAL HIGH (ref 4.0–10.5)
nRBC: 0.1 % (ref 0.0–0.2)

## 2022-02-01 LAB — COMPREHENSIVE METABOLIC PANEL
ALT: 17 U/L (ref 0–44)
AST: 19 U/L (ref 15–41)
Albumin: 3.8 g/dL (ref 3.5–5.0)
Alkaline Phosphatase: 71 U/L (ref 38–126)
Anion gap: 10 (ref 5–15)
BUN: 48 mg/dL — ABNORMAL HIGH (ref 8–23)
CO2: 23 mmol/L (ref 22–32)
Calcium: 8.7 mg/dL — ABNORMAL LOW (ref 8.9–10.3)
Chloride: 107 mmol/L (ref 98–111)
Creatinine, Ser: 0.51 mg/dL (ref 0.44–1.00)
GFR, Estimated: >60 mL/min (ref >60)
Glucose, Bld: 250 mg/dL — ABNORMAL HIGH (ref 70–99)
Potassium: 4.5 mmol/L (ref 3.5–5.1)
Sodium: 140 mmol/L (ref 135–145)
Total Bilirubin: 0.4 mg/dL (ref 0.3–1.2)
Total Protein: 6.7 g/dL (ref 6.5–8.1)

## 2022-02-01 LAB — I-STAT CHEM 8, ED
BUN: 43 mg/dL — ABNORMAL HIGH (ref 8–23)
Calcium, Ion: 1.15 mmol/L (ref 1.15–1.40)
Chloride: 106 mmol/L (ref 98–111)
Creatinine, Ser: 0.5 mg/dL (ref 0.44–1.00)
Glucose, Bld: 240 mg/dL — ABNORMAL HIGH (ref 70–99)
HCT: 28 % — ABNORMAL LOW (ref 36.0–46.0)
Hemoglobin: 9.5 g/dL — ABNORMAL LOW (ref 12.0–15.0)
Potassium: 4.5 mmol/L (ref 3.5–5.1)
Sodium: 139 mmol/L (ref 135–145)
TCO2: 22 mmol/L (ref 22–32)

## 2022-02-01 LAB — HEMOGLOBIN AND HEMATOCRIT, BLOOD
HCT: 23.8 % — ABNORMAL LOW (ref 36.0–46.0)
Hemoglobin: 7.6 g/dL — ABNORMAL LOW (ref 12.0–15.0)

## 2022-02-01 LAB — PREPARE RBC (CROSSMATCH)

## 2022-02-01 LAB — GLUCOSE, CAPILLARY: Glucose-Capillary: 130 mg/dL — ABNORMAL HIGH (ref 70–99)

## 2022-02-01 LAB — POC OCCULT BLOOD, ED: Fecal Occult Bld: POSITIVE — AB

## 2022-02-01 MED ORDER — ONDANSETRON HCL 4 MG PO TABS
4.0000 mg | ORAL_TABLET | Freq: Four times a day (QID) | ORAL | Status: DC | PRN
Start: 2022-02-01 — End: 2022-02-03

## 2022-02-01 MED ORDER — SERTRALINE HCL 50 MG PO TABS
25.0000 mg | ORAL_TABLET | Freq: Every day | ORAL | Status: DC
  Administered 2022-02-01 – 2022-02-02 (×2): 25 mg via ORAL
  Filled 2022-02-01 (×2): qty 1

## 2022-02-01 MED ORDER — ACETAMINOPHEN 650 MG RE SUPP: 650.0000 mg | Freq: Four times a day (QID) | RECTAL | Status: DC | PRN

## 2022-02-01 MED ORDER — ACETAMINOPHEN 325 MG PO TABS
650.0000 mg | ORAL_TABLET | Freq: Four times a day (QID) | ORAL | Status: DC | PRN
  Administered 2022-02-02 – 2022-02-03 (×2): 650 mg via ORAL
  Filled 2022-02-01 (×2): qty 2

## 2022-02-01 MED ORDER — ONDANSETRON HCL 4 MG/2ML IJ SOLN
4.0000 mg | Freq: Once | INTRAMUSCULAR | Status: AC
  Administered 2022-02-01: 4 mg via INTRAVENOUS
  Filled 2022-02-01: qty 2

## 2022-02-01 MED ORDER — ONDANSETRON HCL 4 MG/2ML IJ SOLN: 4.0000 mg | Freq: Four times a day (QID) | INTRAMUSCULAR | Status: DC | PRN

## 2022-02-01 MED ORDER — SODIUM CHLORIDE 0.9% IV SOLUTION: Freq: Once | INTRAVENOUS | Status: DC

## 2022-02-01 MED ORDER — FLUTICASONE FUROATE-VILANTEROL 200-25 MCG/ACT IN AEPB
1.0000 | INHALATION_SPRAY | Freq: Every day | RESPIRATORY_TRACT | Status: DC
  Administered 2022-02-03: 1 via RESPIRATORY_TRACT
  Filled 2022-02-01: qty 28

## 2022-02-01 MED ORDER — ALPRAZOLAM 0.25 MG PO TABS
0.2500 mg | ORAL_TABLET | Freq: Three times a day (TID) | ORAL | Status: DC | PRN
  Administered 2022-02-01 – 2022-02-02 (×2): 0.25 mg via ORAL
  Filled 2022-02-01 (×2): qty 1

## 2022-02-01 MED ORDER — PANTOPRAZOLE SODIUM 40 MG IV SOLR
40.0000 mg | Freq: Two times a day (BID) | INTRAVENOUS | Status: DC
  Administered 2022-02-01 – 2022-02-02 (×3): 40 mg via INTRAVENOUS
  Filled 2022-02-01 (×3): qty 10

## 2022-02-01 MED ORDER — SODIUM CHLORIDE 0.9 % IV BOLUS
1000.0000 mL | Freq: Once | INTRAVENOUS | Status: AC
  Administered 2022-02-01: 1000 mL via INTRAVENOUS

## 2022-02-01 MED ORDER — FENTANYL CITRATE PF 50 MCG/ML IJ SOSY
12.5000 ug | PREFILLED_SYRINGE | INTRAMUSCULAR | Status: DC | PRN
  Administered 2022-02-02: 12.5 ug via INTRAVENOUS
  Filled 2022-02-01: qty 1

## 2022-02-01 MED ORDER — UMECLIDINIUM BROMIDE 62.5 MCG/ACT IN AEPB
1.0000 | INHALATION_SPRAY | Freq: Every day | RESPIRATORY_TRACT | Status: DC
Start: 2022-02-01 — End: 2022-02-03
  Administered 2022-02-03: 1 via RESPIRATORY_TRACT
  Filled 2022-02-01: qty 7

## 2022-02-01 MED ORDER — SERTRALINE HCL 50 MG PO TABS: 25.0000 mg | ORAL_TABLET | Freq: Every day | ORAL | Status: DC

## 2022-02-01 MED ORDER — SIMVASTATIN 20 MG PO TABS
20.0000 mg | ORAL_TABLET | Freq: Every day | ORAL | Status: DC
  Administered 2022-02-01 – 2022-02-02 (×2): 20 mg via ORAL
  Filled 2022-02-01 (×2): qty 1

## 2022-02-01 MED ORDER — MONTELUKAST SODIUM 10 MG PO TABS
10.0000 mg | ORAL_TABLET | Freq: Every day | ORAL | Status: DC
Start: 2022-02-01 — End: 2022-02-03
  Administered 2022-02-01 – 2022-02-02 (×2): 10 mg via ORAL
  Filled 2022-02-01 (×2): qty 1

## 2022-02-01 MED ORDER — OXYCODONE HCL 5 MG PO TABS
5.0000 mg | ORAL_TABLET | Freq: Four times a day (QID) | ORAL | Status: DC | PRN
  Administered 2022-02-01 – 2022-02-02 (×2): 5 mg via ORAL
  Filled 2022-02-01 (×2): qty 1

## 2022-02-01 MED ORDER — PANTOPRAZOLE SODIUM 40 MG IV SOLR
40.0000 mg | Freq: Once | INTRAVENOUS | Status: AC
  Administered 2022-02-01: 40 mg via INTRAVENOUS
  Filled 2022-02-01: qty 10

## 2022-02-01 MED ORDER — LACTATED RINGERS IV SOLN: INTRAVENOUS | Status: DC

## 2022-02-01 NOTE — Assessment & Plan Note (Signed)
--   currently stable, follow clinically  ?

## 2022-02-01 NOTE — H&P (Signed)
?History and Physical  ?[place] ? ?Alexa Nichols QMG:[id number] DOB: [birth year] DOA: 02/01/2022 ? ?PCP: [name], MD  ?Patient coming from: Home by EMS ?Level of care: Telemetry ? ?I have personally briefly reviewed patient's old medical records in [place] ? ?Chief Complaint: vomiting blood  ? ?HPI: Alexa Nichols is a 76 year old female with COPD, asthma, depression and anxiety, GERD, left breast cancer, iron deficiency anemia secondary to AVMs in the small intestine and stomach, Guillain-Barr?, phonic low back pain, heart murmur, exudative age-related macular degeneration of the right eye presented to the emergency department by EMS with vomiting blood that started this morning.  She reports dark red emesis.  She reports that she takes a daily iron supplement and chronically has black stools.  No bright red blood in the stool.  No fever or chills.  No chest pain or shortness of breath.  She has been taking her home Protonix.  In the emergency room she was hypotensive and her hemoglobin was down to 9.1 and her stool tested guaiac positive.  She was given a bolus of IV fluids with improvement in her blood pressures and started on IV Protonix.  Dr. Abbey Chatters with gastroenterology service was consulted and will see in the hospital and requested admission. ? ?Review of Systems: Review of Systems  ?Constitutional:  Positive for malaise/fatigue.  ?HENT: Negative.    ?Eyes:  Positive for blurred vision.  ?Respiratory: Negative.    ?Cardiovascular:  Negative for chest pain, palpitations, orthopnea, leg swelling and PND.  ?Gastrointestinal:  Positive for heartburn, melena, nausea and vomiting.  ?Genitourinary: Negative.   ?Musculoskeletal:  Positive for joint pain and myalgias. Negative for falls.  ?Skin: Negative.   ?Neurological:  Positive for dizziness and weakness.  ?Endo/Heme/Allergies:  Negative for polydipsia. Does not bruise/bleed easily.  ?Psychiatric/Behavioral:  Positive for depression. The  patient is nervous/anxious.   ?All other systems reviewed and are negative. ?  ?Past Medical History:  ?Diagnosis Date  ? Asthma   ? Chronic low back pain   ? COPD (chronic obstructive pulmonary disease) ([place])   ? Depression   ? GERD (gastroesophageal reflux disease)   ? Guillain Barr? syndrome ([place])   ? Heart murmur   ? Lobular carcinoma of left breast ([place]) 1997  ? in situ  ? Other and unspecified hyperlipidemia   ? Seasonal allergies   ? ? ?Past Surgical History:  ?Procedure Laterality Date  ? BIOPSY  12/03/2020  ? Procedure: BIOPSY;  Surgeon: Rogene Houston, MD;  [facility];  Service: Endoscopy;;  gastric polyps biopsies;  ? BREAST SURGERY Left   ? lumpectomy   ? [place]  ? CHOLECYSTECTOMY    ? COLONOSCOPY WITH PROPOFOL N/A 12/03/2020  ?  two 4-7 mm polyps in cecum, one small polyp in cecum, one 5 mm polyp, sigmoid diverticulosis, path with tubular adenomas  ? ESOPHAGOGASTRODUODENOSCOPY (EGD) WITH PROPOFOL N/A 12/03/2020  ? normal esophagus, multiple gastric polyps s/p biopsy, small paraesophageal hernia, normal duodenum. Fundic gland polyps.  ? ESOPHAGOGASTRODUODENOSCOPY (EGD) WITH PROPOFOL N/A 02/05/2021  ? Procedure: ESOPHAGOGASTRODUODENOSCOPY (EGD) WITH PROPOFOL;  Surgeon: [name], MD;  [facility];  Service: Endoscopy;  Laterality: N/A;  ? ESOPHAGOGASTRODUODENOSCOPY (EGD) WITH PROPOFOL N/A 03/03/2021  ? Procedure: ESOPHAGOGASTRODUODENOSCOPY (EGD) WITH PROPOFOL;  Surgeon: [name], MD;  [facility];  Service: Gastroenterology;  Laterality: N/A;  with possible push enteroscopy utilizing the pediatric colonoscope  ? GIVENS CAPSULE STUDY N/A 12/16/2020  ?  normal small bowel capsule but rapid transit time of 21 minutes. Recommended to resume alendronate.   ? GIVENS CAPSULE STUDY N/A 02/04/2021  ? Procedure: GIVENS CAPSULE STUDY;  Surgeon: Rogene Houston, MD;  [facility];  Service: Endoscopy;  Laterality: N/A;  ? HOT HEMOSTASIS   02/05/2021  ? Procedure: HOT HEMOSTASIS (ARGON PLASMA COAGULATION/BICAP);  Surgeon: [name], MD;  [facility];  Service: Endoscopy;;  ? HOT HEMOSTASIS  03/03/2021  ? Procedure: HOT HEMOSTASIS (ARGON PLASMA COAGULATION/BICAP);  Surgeon: [name], [name], MD;  [facility];  Service: Gastroenterology;;  ? TONSILLECTOMY    ? age 79  ? ? ? reports that she quit smoking about 38 years ago. Her smoking use included cigarettes. She started smoking about 57 years ago. She has a 10.00 pack-year smoking history. She has never used smokeless tobacco. She reports that she does not drink alcohol and does not use drugs. ? ?Allergies  ?Allergen Reactions  ? Aspirin   ?  samter's syndrome  ? Codeine Itching  ? Sulfa Antibiotics   ?  Unknown reaction  ? Other Rash  ?  gel filled fentanyl patch, adhesive on that patch caused ithcing  ? ? ?Family History  ?Problem Relation Age of Onset  ? Osteoporosis Mother   ? Other Father   ?     head trauma  ? Breast cancer Sister   ?     X's 2 sisters  ? ? ?Prior to Admission medications   ?Medication Sig Start Date End Date Taking? Authorizing Provider  ?acetaminophen (TYLENOL) 500 MG tablet Take 500 mg by mouth every 6 (six) hours as needed for moderate pain or headache.    [provider]  ?ALPRAZolam Duanne Moron) 0.25 MG tablet Take 1 tablet (0.25 mg total) by mouth daily as needed for anxiety or sleep. 03/05/21   Roxan Hockey, MD  ?ferrous sulfate 325 (65 FE) MG tablet Take 1 tablet (325 mg total) by mouth daily with breakfast. 02/08/21   [name], [name], MD  ?mirtazapine (REMERON) 7.5 MG tablet Take 7.5 mg by mouth at bedtime. ?Patient not taking: Reported on 11/19/2021    [provider]  ?montelukast (SINGULAIR) 10 MG tablet Take 10 mg by mouth at bedtime.    [provider]  ?Multiple Vitamins-Minerals (PRESERVISION AREDS 2) CAPS Take 1 capsule by mouth in the morning and at bedtime.    [provider]   ?pantoprazole (PROTONIX) 40 MG tablet Take 1 tablet (40 mg total) by mouth 2 (two) times daily. 02/07/21 11/19/21  Heath Lark D, DO  ?Polyethylene Glycol 3350 (MIRALAX PO) Take by mouth. Bid    [provider]  ?sertraline (ZOLOFT) 50 MG tablet Take 25 mg by mouth daily. 01/26/16   [provider]  ?simvastatin (ZOCOR) 20 MG tablet Take 20 mg by mouth daily.    [provider]  ?tiZANidine (ZANAFLEX) 4 MG capsule Take 4 mg by mouth 3 (three) times daily as needed for muscle spasms. ?Patient not taking: Reported on 11/19/2021    [provider]  ?Donnal Debar 200-62.5-25 MCG/INH AEPB Inhale 1 puff into the lungs daily. 12/21/20   [provider]  ?vitamin B-12 (CYANOCOBALAMIN) 500 MCG tablet Take 1 tablet (500 mcg total) by mouth daily. 02/24/21   Orson Eva, MD  ? ? ?Physical Exam: ?Vitals:  ? 02/01/22 0748 02/01/22 0800 02/01/22 0815 02/01/22 0830  ?BP:  (!) 87/62 (!) 116/56 133/63  ?Pulse:  (!) 107 96  94  ?Resp:  '20 20 18  '$ ?Temp:      ?TempSrc:      ?SpO2:  96% 100% 100%  ?Weight: 63 kg     ?Height: '4\' 10"'$  (1.473 m)     ? ? ?Constitutional: NAD, calm, comfortable ?Eyes: PERRL, lids and conjunctivae normal ?ENMT: Mucous membranes are moist. Posterior pharynx clear of any exudate or lesions.Normal dentition.  ?Neck: normal, supple, no masses, no thyromegaly ?Respiratory: clear to auscultation bilaterally, no wheezing, no crackles. Normal respiratory effort. No accessory muscle use.  ?Cardiovascular: normal s1, s2 sounds, no murmurs / rubs / gallops. No extremity edema. 2+ pedal pulses. No carotid bruits.  ?Abdomen: no tenderness, no masses palpated. No hepatosplenomegaly. Bowel sounds positive.  ?Musculoskeletal: no clubbing / cyanosis. No joint deformity upper and lower extremities. Good ROM, no contractures. Normal muscle tone.  ?Skin: no rashes, lesions, ulcers. No induration ?Neurologic: CN 2-12 grossly intact. Sensation intact, DTR normal. Strength 5/5 in all 4.   ?Psychiatric: Normal judgment and insight. Alert and oriented x 3. Normal mood.  ? ?Labs on Admission: I have personally reviewed following labs and imaging studies ? ?CBC: ?Recent Labs  ?Lab 02/01/22 ?5974  ?WBC 17.

## 2022-02-01 NOTE — ED Triage Notes (Signed)
Patient via EMS due to vomiting blood that started that started this morning. Emesis is dark red.   ?

## 2022-02-01 NOTE — Consult Note (Addendum)
? ?Gastroenterology Consult  ? ?Referring Provider: [facility] ?Primary Care Physician:  [name], MD ?Primary Gastroenterologist:  Dr. Jenetta Downer ? ?Patient ID: Alexa Nichols; [id number]; Feb 21, 1946  ? ?Admit date: 02/01/2022 ? LOS: 0 days  ? ?Date of Consultation: 02/01/2022 ? ?Reason for Consultation:  UGI bleed ? ?History of Present Illness  ? ?Alexa Nichols is a 76 y.o. year old female with history of IDA due to known gastric and possible small bowel AVMs, presenting this morning with reports of hematemesis and worsening weakness. Hgb 9.1 this morning, previously 12.3 in Aug 2022. Prior evaluation for UGI bleeding last year with push enteroscopy 03/03/21 revealing 2 AVMs in gastric body s/p APC, multiple polyps, and normal duodenum and proximal jejunum. Colonoscopy prior to this with multiple tubular adenomas. Capsule study also completed May 2022 with gastric erosions and question of small bowel AVMs.  ? ? ? ?For the past week has felt more tired. Over the weekend was worsened fatigue. Was going to see PCP today due to worsening fatigue. Felt like her Hgb was dropping. Episode of hematemesis onset this morning around 0630. 4 episodes. Clots/burgundy. Has abdominal "pangs" and "stinging". No NSAIDs. Pantoprazole once daily. Fosamax once weekly. On this for several years. Stool always black on iron. Stool tarry this morning. No dysphagia.  ? ?BP 92/51. Receiving 1 liter NS bolus currently. Has received Protonix IV X 1. No food intake since yesterday evening.  ? ?Husband at bedside.  ? ? ? ? ? ?Past Medical History:  ?Diagnosis Date  ? Asthma   ? Chronic low back pain   ? COPD (chronic obstructive pulmonary disease) ([place])   ? Depression   ? GERD (gastroesophageal reflux disease)   ? Guillain Barr? syndrome ([place])   ? Heart murmur   ? Lobular carcinoma of left breast ([place]) 1997  ? in situ  ? Other and unspecified hyperlipidemia   ? Seasonal allergies   ? ? ?Past Surgical History:  ?Procedure Laterality Date   ? BIOPSY  12/03/2020  ? Procedure: BIOPSY;  Surgeon: Rogene Houston, MD;  [facility];  Service: Endoscopy;;  gastric polyps biopsies;  ? BREAST SURGERY Left   ? lumpectomy   ? [place]  ? CHOLECYSTECTOMY    ? COLONOSCOPY WITH PROPOFOL N/A 12/03/2020  ?  two 4-7 mm polyps in cecum, one small polyp in cecum, one 5 mm polyp, sigmoid diverticulosis, path with tubular adenomas  ? ESOPHAGOGASTRODUODENOSCOPY (EGD) WITH PROPOFOL N/A 12/03/2020  ? normal esophagus, multiple gastric polyps s/p biopsy, small paraesophageal hernia, normal duodenum. Fundic gland polyps.  ? ESOPHAGOGASTRODUODENOSCOPY (EGD) WITH PROPOFOL N/A 02/05/2021  ? Procedure: ESOPHAGOGASTRODUODENOSCOPY (EGD) WITH PROPOFOL;  Surgeon: [name], MD;  [facility];  Service: Endoscopy;  Laterality: N/A;  ? ESOPHAGOGASTRODUODENOSCOPY (EGD) WITH PROPOFOL N/A 03/03/2021  ? Procedure: ESOPHAGOGASTRODUODENOSCOPY (EGD) WITH PROPOFOL;  Surgeon: [name], MD;  [facility];  Service: Gastroenterology;  Laterality: N/A;  with possible push enteroscopy utilizing the pediatric colonoscope  ? GIVENS CAPSULE STUDY N/A 12/16/2020  ? normal small bowel capsule but rapid transit time of 21 minutes. Recommended to resume alendronate.   ? GIVENS CAPSULE STUDY N/A 02/04/2021  ? Procedure: GIVENS CAPSULE STUDY;  Surgeon: Rogene Houston, MD;  [facility];  Service: Endoscopy;  Laterality: N/A;  ? HOT HEMOSTASIS  02/05/2021  ? Procedure: HOT HEMOSTASIS (ARGON PLASMA COAGULATION/BICAP);  Surgeon: [name], MD;  [facility];  Service: Endoscopy;;  ? HOT HEMOSTASIS  03/03/2021  ? Procedure: HOT HEMOSTASIS (ARGON PLASMA COAGULATION/BICAP);  Surgeon: [name], [name], MD;  [facility];  Service: Gastroenterology;;  ? TONSILLECTOMY    ? age 9  ? ? ?Prior to Admission medications   ?Medication Sig Start Date End Date Taking? Authorizing Provider  ?acetaminophen (TYLENOL) 500  MG tablet Take 500 mg by mouth every 6 (six) hours as needed for moderate pain or headache.    [provider]  ?ALPRAZolam Duanne Moron) 0.25 MG tablet Take 1 tablet (0.25 mg total) by mouth daily as needed for anxiety or sleep. 03/05/21   Roxan Hockey, MD  ?ferrous sulfate 325 (65 FE) MG tablet Take 1 tablet (325 mg total) by mouth daily with breakfast. 02/08/21   [name], [name], MD  ?mirtazapine (REMERON) 7.5 MG tablet Take 7.5 mg by mouth at bedtime. ?Patient not taking: Reported on 11/19/2021    [provider]  ?montelukast (SINGULAIR) 10 MG tablet Take 10 mg by mouth at bedtime.    [provider]  ?Multiple Vitamins-Minerals (PRESERVISION AREDS 2) CAPS Take 1 capsule by mouth in the morning and at bedtime.    [provider]  ?pantoprazole (PROTONIX) 40 MG tablet Take 1 tablet (40 mg total) by mouth 2 (two) times daily. 02/07/21 11/19/21  Heath Lark D, DO  ?Polyethylene Glycol 3350 (MIRALAX PO) Take by mouth. Bid    [provider]  ?sertraline (ZOLOFT) 50 MG tablet Take 25 mg by mouth daily. 01/26/16   [provider]  ?simvastatin (ZOCOR) 20 MG tablet Take 20 mg by mouth daily.    [provider]  ?tiZANidine (ZANAFLEX) 4 MG capsule Take 4 mg by mouth 3 (three) times daily as needed for muscle spasms. ?Patient not taking: Reported on 11/19/2021    [provider]  ?Donnal Debar 200-62.5-25 MCG/INH AEPB Inhale 1 puff into the lungs daily. 12/21/20   [provider]  ?vitamin B-12 (CYANOCOBALAMIN) 500 MCG tablet Take 1 tablet (500 mcg total) by mouth daily. 02/24/21   Orson Eva, MD  ? ? ?No current facility-administered medications for this encounter.  ? ?Current Outpatient Medications  ?Medication Sig Dispense Refill  ? acetaminophen (TYLENOL) 500 MG tablet Take 500 mg by mouth every 6 (six) hours as needed for moderate pain or headache.    ? ALPRAZolam (XANAX) 0.25 MG tablet Take 1 tablet (0.25 mg total) by mouth daily as  needed for anxiety or sleep. 10 tablet 2  ? ferrous sulfate 325 (65 FE) MG tablet Take 1 tablet (325 mg total) by mouth daily with breakfast. 90 tablet 3  ? mirtazapine (REMERON) 7.5 MG tablet Take 7.5 mg by mouth at bedtime. (Patient not taking: Reported on 11/19/2021)    ? montelukast (SINGULAIR) 10 MG tablet Take 10 mg by mouth at bedtime.    ? Multiple Vitamins-Minerals (PRESERVISION AREDS 2) CAPS Take 1 capsule by mouth in the morning and at bedtime.    ? pantoprazole (PROTONIX) 40 MG tablet Take 1 tablet (40 mg total) by mouth 2 (two) times daily. 180 tablet 0  ? Polyethylene Glycol 3350 (MIRALAX PO) Take by mouth. Bid    ? sertraline (ZOLOFT) 50 MG tablet Take 25 mg by mouth daily.    ? simvastatin (ZOCOR) 20 MG tablet Take 20 mg by mouth daily.    ? tiZANidine (ZANAFLEX) 4 MG capsule Take 4 mg by mouth 3 (three) times daily as needed for muscle spasms. (Patient not taking: Reported  on 11/19/2021)    ? TRELEGY ELLIPTA 200-62.5-25 MCG/INH AEPB Inhale 1 puff into the lungs daily.    ? vitamin B-12 (CYANOCOBALAMIN) 500 MCG tablet Take 1 tablet (500 mcg total) by mouth daily.    ? ? ?Allergies as of 02/01/2022 - Review Complete 02/01/2022  ?Allergen Reaction Noted  ? Aspirin  02/14/2020  ? Codeine Itching 02/14/2020  ? Sulfa antibiotics  02/14/2020  ? Other Rash 01/14/2014  ? ? ?Family History  ?Problem Relation Age of Onset  ? Osteoporosis Mother   ? Other Father   ?     head trauma  ? Breast cancer Sister   ?     X's 2 sisters  ? ? ?Social History  ? ?Socioeconomic History  ? Marital status: Married  ?  Spouse name: Not on file  ? Number of children: Not on file  ? Years of education: Not on file  ? Highest education level: Not on file  ?Occupational History  ? Not on file  ?Tobacco Use  ? Smoking status: Former  ?  Packs/day: 0.50  ?  Years: 20.00  ?  Pack years: 10.00  ?  Types: Cigarettes  ?  Start date: 09/16/1964  ?  Quit date: 10/05/1983  ?  Years since quitting: 38.3  ? Smokeless tobacco: Never  ?Vaping Use   ? Vaping Use: Never used  ?Substance and Sexual Activity  ? Alcohol use: Never  ?  Alcohol/week: 0.0 standard drinks  ? Drug use: Never  ? Sexual activity: Not on file  ?Other Topics Concern  ? Not on

## 2022-02-01 NOTE — Assessment & Plan Note (Addendum)
--   IV protonix ordered ?-- type and screen  ?-- continue supportive measures  ?-- transfuse as needed  ?-- GI consulted, NPO for now ?-- EGD later today ?-- Hg 8.4 after 2 units PRBC given 5/1 ?-- 2 units PRBC on hold/standby in case needed ?

## 2022-02-01 NOTE — ED Provider Notes (Signed)
?[facility] ?Provider Note ? ? ?CSN: [id number] ?Arrival date & time: 02/01/22  0739 ? ?  ? ?History ? ?Chief Complaint  ?Patient presents with  ? GI Bleeding  ? ? ?Alexa Nichols is a 76 y.o. female. ? ?Patient has a history of COPD.  She has been vomiting blood today.  She vomited 3 times.  She also has a history of AVMs in her stomach and duodenum ? ?The history is provided by the patient and medical records. No language interpreter was used.  ?Emesis ?Severity:  Moderate ?Timing:  Constant ?Quality:  Coffee grounds ?Able to tolerate:  Liquids ?Progression:  Unchanged ?Chronicity:  Recurrent ?Recent urination:  Normal ?Relieved by:  Nothing ?Worsened by:  Nothing ?Associated symptoms: no abdominal pain, no cough, no diarrhea and no headaches   ? ?  ? ?Home Medications ?Prior to Admission medications   ?Medication Sig Start Date End Date Taking? Authorizing Provider  ?acetaminophen (TYLENOL) 500 MG tablet Take 500 mg by mouth every 6 (six) hours as needed for moderate pain or headache.    [provider]  ?ALPRAZolam Duanne Moron) 0.25 MG tablet Take 1 tablet (0.25 mg total) by mouth daily as needed for anxiety or sleep. 03/05/21   Roxan Hockey, MD  ?ferrous sulfate 325 (65 FE) MG tablet Take 1 tablet (325 mg total) by mouth daily with breakfast. 02/08/21   [name], [name], MD  ?mirtazapine (REMERON) 7.5 MG tablet Take 7.5 mg by mouth at bedtime. ?Patient not taking: Reported on 11/19/2021    [provider]  ?montelukast (SINGULAIR) 10 MG tablet Take 10 mg by mouth at bedtime.    [provider]  ?Multiple Vitamins-Minerals (PRESERVISION AREDS 2) CAPS Take 1 capsule by mouth in the morning and at bedtime.    [provider]  ?pantoprazole (PROTONIX) 40 MG tablet Take 1 tablet (40 mg total) by mouth 2 (two) times daily. 02/07/21 11/19/21  Heath Lark D, DO  ?Polyethylene Glycol 3350 (MIRALAX PO) Take by mouth. Bid    [provider]  ?sertraline  (ZOLOFT) 50 MG tablet Take 25 mg by mouth daily. 01/26/16   [provider]  ?simvastatin (ZOCOR) 20 MG tablet Take 20 mg by mouth daily.    [provider]  ?tiZANidine (ZANAFLEX) 4 MG capsule Take 4 mg by mouth 3 (three) times daily as needed for muscle spasms. ?Patient not taking: Reported on 11/19/2021    [provider]  ?Donnal Debar 200-62.5-25 MCG/INH AEPB Inhale 1 puff into the lungs daily. 12/21/20   [provider]  ?vitamin B-12 (CYANOCOBALAMIN) 500 MCG tablet Take 1 tablet (500 mcg total) by mouth daily. 02/24/21   Orson Eva, MD  ?   ? ?Allergies    ?Aspirin, Codeine, Sulfa antibiotics, and Other   ? ?Review of Systems   ?Review of Systems  ?Constitutional:  Negative for appetite change and fatigue.  ?HENT:  Negative for congestion, ear discharge and sinus pressure.   ?Eyes:  Negative for discharge.  ?Respiratory:  Negative for cough.   ?Cardiovascular:  Negative for chest pain.  ?Gastrointestinal:  Positive for vomiting. Negative for abdominal pain and diarrhea.  ?     Vomiting blood  ?Genitourinary:  Negative for frequency and hematuria.  ?Musculoskeletal:  Negative for back pain.  ?Skin:  Negative for rash.  ?Neurological:  Negative for seizures and headaches.  ?Psychiatric/Behavioral:  Negative for hallucinations.   ? ?Physical Exam ?Updated Vital Signs ?BP 133/63   Pulse 94   Temp  97.7 ?F (36.5 ?C) (Oral)   Resp 18   Ht '4\' 10"'$  (1.473 m)   Wt 63 kg   SpO2 100%   BMI 29.05 kg/m?  ?Physical Exam ?Vitals and nursing note reviewed.  ?Constitutional:   ?   Appearance: She is well-developed.  ?HENT:  ?   Head: Normocephalic.  ?   Mouth/Throat:  ?   Mouth: Mucous membranes are moist.  ?Eyes:  ?   General: No scleral icterus. ?   Conjunctiva/sclera: Conjunctivae normal.  ?Neck:  ?   Thyroid: No thyromegaly.  ?Cardiovascular:  ?   Rate and Rhythm: Normal rate and regular rhythm.  ?   Heart sounds: No murmur heard. ?  No friction rub. No gallop.  ?Pulmonary:  ?    Breath sounds: No stridor. No wheezing or rales.  ?Chest:  ?   Chest wall: No tenderness.  ?Abdominal:  ?   General: There is no distension.  ?   Tenderness: There is abdominal tenderness. There is no rebound.  ?Musculoskeletal:     ?   General: Normal range of motion.  ?   Cervical back: Neck supple.  ?Lymphadenopathy:  ?   Cervical: No cervical adenopathy.  ?Skin: ?   Findings: No erythema or rash.  ?Neurological:  ?   Mental Status: She is alert and oriented to person, place, and time.  ?   Motor: No abnormal muscle tone.  ?   Coordination: Coordination normal.  ?Psychiatric:     ?   Behavior: Behavior normal.  ? ? ?ED Results / Procedures / Treatments   ?Labs ?(all labs ordered are listed, but only abnormal results are displayed) ?Labs Reviewed  ?CBC WITH DIFFERENTIAL/PLATELET - Abnormal; Notable for the following components:  ?    Result Value  ? WBC 17.8 (*)   ? RBC 2.96 (*)   ? Hemoglobin 9.1 (*)   ? HCT 28.4 (*)   ? Neutro Abs 14.3 (*)   ? Abs Immature Granulocytes 0.33 (*)   ? All other components within normal limits  ?COMPREHENSIVE METABOLIC PANEL - Abnormal; Notable for the following components:  ? Glucose, Bld 250 (*)   ? BUN 48 (*)   ? Calcium 8.7 (*)   ? All other components within normal limits  ?POC OCCULT BLOOD, ED - Abnormal; Notable for the following components:  ? Fecal Occult Bld POSITIVE (*)   ? All other components within normal limits  ?I-STAT CHEM 8, ED  ?TYPE AND SCREEN  ? ? ?EKG ?None ? ?Radiology ?No results found. ? ?Procedures ?Procedures  ? ? ?Medications Ordered in ED ?Medications  ?sodium chloride 0.9 % bolus 1,000 mL (1,000 mLs Intravenous New Bag/Given 02/01/22 0800)  ?pantoprazole (PROTONIX) injection 40 mg (40 mg Intravenous Given 02/01/22 0758)  ?ondansetron [facility]) injection 4 mg (4 mg Intravenous Given 02/01/22 0758)  ? ? ?ED Course/ Medical Decision Making/ A&P ? CRITICAL CARE ?Performed by: Milton Ferguson ?Total critical care time: 40 minutes ?Critical care time was exclusive of  separately billable procedures and treating other patients. ?Critical care was necessary to treat or prevent imminent or life-threatening deterioration. ?Critical care was time spent personally by me on the following activities: development of treatment plan with patient and/or surrogate as well as nursing, discussions with consultants, evaluation of patient's response to treatment, examination of patient, obtaining history from patient or surrogate, ordering and performing treatments and interventions, ordering and review of laboratory studies, ordering and review of radiographic studies, pulse oximetry and re-evaluation  of patient's condition. ? ? ?I spoke to Dr. Abbey Chatters GI and he will consult on the patient today.  No additional treatment right now besides Protonix IV and normal saline  ?                        ?Medical Decision Making ?Amount and/or Complexity of Data Reviewed ?Labs: ordered. ? ?Risk ?Prescription drug management. ?Decision regarding hospitalization. ? ?This patient presents to the ED for concern of vomiting blood, this involves an extensive number of treatment options, and is a complaint that carries with it a high risk of complications and morbidity.  The differential diagnosis includes gastric ulcer, esophageal varices, Mallory-Weiss tear ? ? ?Co morbidities that complicate the patient evaluation ? ?AVM in the stomach and duodenum ? ? ?Additional history obtained: ? ?Additional history obtained from patient ?External records from outside source obtained and reviewed including hospital records ? ? ?Lab Tests: ? ?I Ordered, and personally interpreted labs.  The pertinent results include: Chemistries showed BUN elevated 43 and glucose 240.  Patient also has anemia with hemoglobin 9.5 ? ? ?Imaging Studies ordered: ?No imaging ? ?Cardiac Monitoring: / EKG: ? ?The patient was maintained on a cardiac monitor.  I personally viewed and interpreted the cardiac monitored which showed an underlying rhythm  of: Normal sinus rhythm ? ? ?Consultations Obtained: ? ?I requested consultation with the GI and hospitalist,  and discussed lab and imaging findings as well as pertinent plan - they recommend: Admit and continue

## 2022-02-01 NOTE — Assessment & Plan Note (Addendum)
--   suspect reactive from recent vomiting and upper GI bleeding ?-- WBC trending down today  ?

## 2022-02-01 NOTE — Progress Notes (Addendum)
Pt had an temp of 98.4 prior to blood transfusion, assigned nurse rechecked at the 15 minute mark and temp is 99.8, MD was notified by assigned nurse and said to continue with blood transfusion. Pt voices no concerns and no discomfort noted. Temp rechecked by this nurse 15 minutes later and temp 99.2 orally, assigned nurse notified. Oncoming MD made aware of new noted temp, along with oncoming staff assigned to patient, along with charge nurse.  ?

## 2022-02-01 NOTE — Hospital Course (Signed)
76 year old female with COPD, asthma, depression and anxiety, GERD, left breast cancer, iron deficiency anemia secondary to AVMs in the small intestine and stomach, Guillain-Barr?, phonic low back pain, heart murmur, exudative age-related macular degeneration of the right eye presented to the emergency department by EMS with vomiting blood that started this morning.  She reports dark red emesis.  She reports that she takes a daily iron supplement and chronically has black stools.  No bright red blood in the stool.  No fever or chills.  No chest pain or shortness of breath.  She has been taking her home Protonix.  In the emergency room she was hypotensive and her hemoglobin was down to 9.1 and her stool tested guaiac positive.  She was given a bolus of IV fluids with improvement in her blood pressures and started on IV Protonix.  Dr. Abbey Chatters with gastroenterology service was consulted and will see in the hospital and requested admission. ?

## 2022-02-01 NOTE — Assessment & Plan Note (Signed)
--   resume home simvastatin daily  

## 2022-02-01 NOTE — Assessment & Plan Note (Addendum)
--   Hg down to 9.1 ?-- type and screen  ?-- s/p 2 units PRBC on 02/01/22 ?

## 2022-02-01 NOTE — Assessment & Plan Note (Addendum)
--   from upper GI bleeding ?-- appreciate GI consultation and management recommendations ?-- EGD planned for later today.  Pt NPO at this time.   ?

## 2022-02-01 NOTE — Assessment & Plan Note (Addendum)
--   supportive management  ?-- transfuse as needed  ?-- EGD later today ?

## 2022-02-01 NOTE — Assessment & Plan Note (Addendum)
--   suspect acute stress reaction, no further hyperglycemia seen ?

## 2022-02-01 NOTE — Assessment & Plan Note (Signed)
---   chronic likely from iron supplements, follow  ?

## 2022-02-01 NOTE — Assessment & Plan Note (Addendum)
--   supportive management ?-- transfuse as needed  ?-- EGD later today ?

## 2022-02-02 ENCOUNTER — Encounter (HOSPITAL_COMMUNITY): Payer: Self-pay | Admitting: Family Medicine

## 2022-02-02 ENCOUNTER — Encounter (HOSPITAL_COMMUNITY)
Admission: EM | Disposition: A | Discharge status: Home / Self Care | Payer: Self-pay | Source: Home / Self Care | Attending: Family Medicine

## 2022-02-02 ENCOUNTER — Inpatient Hospital Stay (HOSPITAL_COMMUNITY): Payer: Medicare Other | Admitting: Certified Registered"

## 2022-02-02 DIAGNOSIS — J449 Chronic obstructive pulmonary disease, unspecified: Secondary | ICD-10-CM

## 2022-02-02 DIAGNOSIS — K5520 Angiodysplasia of colon without hemorrhage: Secondary | ICD-10-CM | POA: Diagnosis not present

## 2022-02-02 DIAGNOSIS — K920 Hematemesis: Secondary | ICD-10-CM

## 2022-02-02 DIAGNOSIS — K31811 Angiodysplasia of stomach and duodenum with bleeding: Secondary | ICD-10-CM

## 2022-02-02 DIAGNOSIS — D62 Acute posthemorrhagic anemia: Secondary | ICD-10-CM

## 2022-02-02 DIAGNOSIS — K31819 Angiodysplasia of stomach and duodenum without bleeding: Secondary | ICD-10-CM

## 2022-02-02 DIAGNOSIS — K922 Gastrointestinal hemorrhage, unspecified: Secondary | ICD-10-CM | POA: Diagnosis not present

## 2022-02-02 DIAGNOSIS — Z87891 Personal history of nicotine dependence: Secondary | ICD-10-CM

## 2022-02-02 DIAGNOSIS — K226 Gastro-esophageal laceration-hemorrhage syndrome: Principal | ICD-10-CM

## 2022-02-02 HISTORY — PX: ENTEROSCOPY: SHX5533

## 2022-02-02 HISTORY — PX: ESOPHAGOGASTRODUODENOSCOPY (EGD) WITH PROPOFOL: SHX5813

## 2022-02-02 HISTORY — PX: HOT HEMOSTASIS: SHX5433

## 2022-02-02 LAB — BPAM RBC
Blood Product Expiration Date: 202306072359
Blood Product Expiration Date: 202306072359
ISSUE DATE / TIME: 202305011820
ISSUE DATE / TIME: 202305012117
Unit Type and Rh: 600
Unit Type and Rh: 600

## 2022-02-02 LAB — TYPE AND SCREEN
ABO/RH(D): A NEG
Antibody Screen: POSITIVE
Donor AG Type: NEGATIVE
Donor AG Type: NEGATIVE
Unit division: 0
Unit division: 0

## 2022-02-02 LAB — CBC
HCT: 27.6 % — ABNORMAL LOW (ref 36.0–46.0)
Hemoglobin: 8.4 g/dL — ABNORMAL LOW (ref 12.0–15.0)
MCH: 28.6 pg (ref 26.0–34.0)
MCHC: 30.4 g/dL (ref 30.0–36.0)
MCV: 93.9 fL (ref 80.0–100.0)
Platelets: 125 10*3/uL — ABNORMAL LOW (ref 150–400)
RBC: 2.94 MIL/uL — ABNORMAL LOW (ref 3.87–5.11)
RDW: 14.5 % (ref 11.5–15.5)
WBC: 10.8 10*3/uL — ABNORMAL HIGH (ref 4.0–10.5)
nRBC: 0.2 % (ref 0.0–0.2)

## 2022-02-02 LAB — COMPREHENSIVE METABOLIC PANEL
ALT: 14 U/L (ref 0–44)
AST: 17 U/L (ref 15–41)
Albumin: 3 g/dL — ABNORMAL LOW (ref 3.5–5.0)
Alkaline Phosphatase: 50 U/L (ref 38–126)
Anion gap: 5 (ref 5–15)
BUN: 17 mg/dL (ref 8–23)
CO2: 24 mmol/L (ref 22–32)
Calcium: 7.8 mg/dL — ABNORMAL LOW (ref 8.9–10.3)
Chloride: 111 mmol/L (ref 98–111)
Creatinine, Ser: 0.37 mg/dL — ABNORMAL LOW (ref 0.44–1.00)
GFR, Estimated: >60 mL/min (ref >60)
Glucose, Bld: 102 mg/dL — ABNORMAL HIGH (ref 70–99)
Potassium: 3.8 mmol/L (ref 3.5–5.1)
Sodium: 140 mmol/L (ref 135–145)
Total Bilirubin: 0.2 mg/dL — ABNORMAL LOW (ref 0.3–1.2)
Total Protein: 5.2 g/dL — ABNORMAL LOW (ref 6.5–8.1)

## 2022-02-02 LAB — MAGNESIUM: Magnesium: 2 mg/dL (ref 1.7–2.4)

## 2022-02-02 SURGERY — ESOPHAGOGASTRODUODENOSCOPY (EGD) WITH PROPOFOL
Anesthesia: General

## 2022-02-02 MED ORDER — LACTATED RINGERS IV SOLN: INTRAVENOUS | Status: DC

## 2022-02-02 MED ORDER — PROPOFOL 500 MG/50ML IV EMUL
INTRAVENOUS | Status: DC | PRN
  Administered 2022-02-02: 100 ug/kg/min via INTRAVENOUS

## 2022-02-02 MED ORDER — PANTOPRAZOLE SODIUM 40 MG PO TBEC
40.0000 mg | DELAYED_RELEASE_TABLET | Freq: Two times a day (BID) | ORAL | Status: DC
  Administered 2022-02-02 – 2022-02-03 (×2): 40 mg via ORAL
  Filled 2022-02-02 (×2): qty 1

## 2022-02-02 MED ORDER — SODIUM CHLORIDE 0.9 % IV SOLN: INTRAVENOUS | Status: DC

## 2022-02-02 MED ORDER — PROPOFOL 10 MG/ML IV BOLUS
INTRAVENOUS | Status: DC | PRN
  Administered 2022-02-02: 80 mg via INTRAVENOUS
  Administered 2022-02-02: 50 mg via INTRAVENOUS
  Administered 2022-02-02: 20 mg via INTRAVENOUS

## 2022-02-02 MED ORDER — LIDOCAINE HCL (CARDIAC) PF 100 MG/5ML IV SOSY
PREFILLED_SYRINGE | INTRAVENOUS | Status: DC | PRN
Start: 2022-02-02 — End: 2022-02-02
  Administered 2022-02-02: 50 mg via INTRAVENOUS

## 2022-02-02 NOTE — Transfer of Care (Signed)
Immediate Anesthesia Transfer of Care Note ? ?Patient: Alexa Nichols ? ?Procedure(s) Performed: ESOPHAGOGASTRODUODENOSCOPY (EGD) WITH PROPOFOL ?ENTEROSCOPY ?HOT HEMOSTASIS (ARGON PLASMA COAGULATION/BICAP) ? ?Patient Location: PACU ? ?Anesthesia Type:General ? ?Level of Consciousness: drowsy ? ?Airway & Oxygen Therapy: Patient Spontanous Breathing ? ?Post-op Assessment: Report given to RN and Post -op Vital signs reviewed and stable ? ?Post vital signs: Reviewed and stable ? ?Last Vitals:  ?Vitals Value Taken Time  ?BP    ?Temp    ?Pulse    ?Resp    ?SpO2    ? ? ?Last Pain:  ?Vitals:  ? 02/02/22 1502  ?TempSrc:   ?PainSc: 0-No pain  ?   ? ?  ? ?Complications: No notable events documented. ?

## 2022-02-02 NOTE — Brief Op Note (Addendum)
02/01/2022 - 02/02/2022 ? ?3:22 PM ? ?PATIENT:  Alexa Nichols  76 y.o. female ? ?PRE-OPERATIVE DIAGNOSIS:  UGI bleed, history of AVMs, acute blood loss anemia ? ?POST-OPERATIVE DIAGNOSIS:  Arterio-venous malformations - stomach, mallory-weiss tear ? ?PROCEDURE:  Procedure(s): ?ESOPHAGOGASTRODUODENOSCOPY (EGD) WITH PROPOFOL (N/A) ?ENTEROSCOPY (N/A) ?HOT HEMOSTASIS (ARGON PLASMA COAGULATION/BICAP) ? ?SURGEON:  Surgeon(s) and Role: ?   [name], MD - Primary ? ?Patient underwent EGD under propofol sedation.  Tolerated the procedure adequately.  A 6 mm bleeding Mallory-Weiss linear tear with stigmata of recent bleeding was found (adhered clot) - there was evidence of ongoing oozing after removing the clot..  For hemostasis, one hemostatic clip was successfully placed.  There was no bleeding at the end of the procedure. Hematin (altered blood/coffee-ground-like material) was found in the entire examined stomach. A single 4 mm angiodysplastic lesion with stigmata of recent bleeding was found on the greater curvature of the stomach.  Coagulation for bleeding prevention using argon beam at 0.3 liters/minute and 20 watts was successful. The examined duodenum was normal.  ? ?RECOMMENDATIONS ?- Return patient to hospital ward for ongoing care.  ?- Resume previous diet.  ?- Check H/H in AM. ?- Pantoprazole 40 mg twice a day PO ? ?[name], MD ?Gastroenterology and Hepatology ?[facility] ? ?

## 2022-02-02 NOTE — Anesthesia Preprocedure Evaluation (Signed)
Anesthesia Evaluation  ?Patient identified by MRN, date of birth, ID band ?Patient awake ? ? ? ?Reviewed: ?Allergy & Precautions, H&P , NPO status , Patient's Chart, lab work & pertinent test results, reviewed documented beta blocker date and time  ? ?Airway ?Mallampati: II ? ?TM Distance: >3 FB ?Neck ROM: full ? ? ? Dental ?no notable dental hx. ? ?  ?Pulmonary ?asthma , COPD, former smoker,  ?  ?Pulmonary exam normal ?breath sounds clear to auscultation ? ? ? ? ? ? Cardiovascular ?Exercise Tolerance: Good ?negative cardio ROS ? ? ?Rhythm:regular Rate:Normal ? ? ?  ?Neuro/Psych ?PSYCHIATRIC DISORDERS Depression  Neuromuscular disease   ? GI/Hepatic ?Neg liver ROS, GERD  Medicated,  ?Endo/Other  ?negative endocrine ROS ? Renal/GU ?negative Renal ROS  ?negative genitourinary ?  ?Musculoskeletal ? ? Abdominal ?  ?Peds ? Hematology ? ?(+) Blood dyscrasia, anemia ,   ?Anesthesia Other Findings ? ? Reproductive/Obstetrics ?negative OB ROS ? ?  ? ? ? ? ? ? ? ? ? ? ? ? ? ?  ?  ? ? ? ? ? ? ? ? ?Anesthesia Physical ?Anesthesia Plan ? ?ASA: 3 and emergent ? ?Anesthesia Plan: General  ? ?Post-op Pain Management:   ? ?Induction:  ? ?PONV Risk Score and Plan: Propofol infusion ? ?Airway Management Planned:  ? ?Additional Equipment:  ? ?Intra-op Plan:  ? ?Post-operative Plan:  ? ?Informed Consent: I have reviewed the patients History and Physical, chart, labs and discussed the procedure including the risks, benefits and alternatives for the proposed anesthesia with the patient or authorized representative who has indicated his/her understanding and acceptance.  ? ? ? ?Dental Advisory Given ? ?Plan Discussed with: CRNA ? ?Anesthesia Plan Comments:   ? ? ? ? ? ? ?Anesthesia Quick Evaluation ? ?

## 2022-02-02 NOTE — Anesthesia Procedure Notes (Signed)
Date/Time: 02/02/2022 3:04 PM ?Performed by: Orlie Dakin, CRNA ?Pre-anesthesia Checklist: Patient identified, Emergency Drugs available, Suction available and Patient being monitored ?Patient Re-evaluated:Patient Re-evaluated prior to induction ?Oxygen Delivery Method: Nasal cannula ?Induction Type: IV induction ?Placement Confirmation: positive ETCO2 ? ? ? ? ?

## 2022-02-02 NOTE — Progress Notes (Signed)
?PROGRESS NOTE ? ? ?Alexa Nichols  NKN:[id number] DOB: [birth year] DOA: 02/01/2022 ?PCP: [name], MD  ? ?Chief Complaint  ?Patient presents with  ? GI Bleeding  ? ?Level of care: Telemetry ? ?Brief Admission History:  ?76 year old female with COPD, asthma, depression and anxiety, GERD, left breast cancer, iron deficiency anemia secondary to AVMs in the small intestine and stomach, Guillain-Barr?, phonic low back pain, heart murmur, exudative age-related macular degeneration of the right eye presented to the emergency department by EMS with vomiting blood that started this morning.  She reports dark red emesis.  She reports that she takes a daily iron supplement and chronically has black stools.  No bright red blood in the stool.  No fever or chills.  No chest pain or shortness of breath.  She has been taking her home Protonix.  In the emergency room she was hypotensive and her hemoglobin was down to 9.1 and her stool tested guaiac positive.  She was given a bolus of IV fluids with improvement in her blood pressures and started on IV Protonix.  Dr. Abbey Chatters with gastroenterology service was consulted and will see in the hospital and requested admission. ?  ?Assessment and Plan: ?* Acute upper GI hemorrhage ?-- IV protonix ordered ?-- type and screen  ?-- continue supportive measures  ?-- transfuse as needed  ?-- GI consulted, NPO for now ?-- EGD later today ?-- Hg 8.4 after 2 units PRBC given 5/1 ?-- 2 units PRBC on hold/standby in case needed ? ?Guaiac positive stools ?-- from upper GI bleeding ?-- appreciate GI consultation and management recommendations ?-- EGD planned for later today.  Pt NPO at this time.   ? ?Acute blood loss anemia ?-- Hg down to 9.1 ?-- type and screen  ?-- s/p 2 units PRBC on 02/01/22 ? ?Leukocytosis ?-- suspect reactive from recent vomiting and upper GI bleeding ?-- WBC trending down today  ? ?Hyperglycemia ?-- suspect acute stress reaction, no further hyperglycemia seen ? ?AVM  (arteriovenous malformation) of stomach, acquired ?-- supportive management  ?-- transfuse as needed  ?-- EGD later today ? ?AVM (arteriovenous malformation) of small bowel, acquired ?-- supportive management ?-- transfuse as needed  ?-- EGD later today ? ?Constipation ?--- chronic likely from iron supplements, follow  ? ?COPD (chronic obstructive pulmonary disease) ([place]) ?-- currently stable, follow clinically  ? ?Hyperlipidemia ?-- resume home simvastatin daily  ? ?DVT prophylaxis: scds ?Code Status: full  ?Family Communication:  ?Disposition: Status is: Inpatient ?Remains inpatient appropriate because: EGD planned today, ongoing resuscitation  ?  ?Consultants:  ?GI service  ?Procedures:  ?EGD pending 02/02/22 ?Antimicrobials:  ?  ?Subjective: ?Pt reports some malaise but no further hematemesis.   ?Objective: ?Vitals:  ? 02/02/22 0007 02/02/22 0325 02/02/22 1222 02/02/22 1306  ?BP: (!) 114/59 115/63 (!) 115/57 (!) 117/56  ?Pulse: 86 82 76 78  ?Resp: '18 18 16 13  '$ ?Temp: 98.7 ?F (37.1 ?C) 97.7 ?F (36.5 ?C) 99 ?F (37.2 ?C) 98.8 ?F (37.1 ?C)  ?TempSrc:    Oral  ?SpO2:  97% 98% 95%  ?Weight:      ?Height:      ? ? ?Intake/Output Summary (Last 24 hours) at 02/02/2022 1318 ?Last data filed at 02/02/2022 0700 ?Gross per 24 hour  ?Intake 1101.22 ml  ?Output 1000 ml  ?Net 101.22 ml  ? ?Filed Weights  ? 02/01/22 0748 02/01/22 1100  ?Weight: 63 kg 63 kg  ? ?Examination: ? ?General exam: Appears calm and comfortable  ?Respiratory system:  Clear to auscultation. Respiratory effort normal. ?Cardiovascular system: normal S1 & S2 heard. No JVD, murmurs, rubs, gallops or clicks. No pedal edema. ?Gastrointestinal system: Abdomen is nondistended, soft and nontender. No organomegaly or masses felt. Normal bowel sounds heard. ?Central nervous system: Alert and oriented. No focal neurological deficits. ?Extremities: Symmetric 5 x 5 power. ?Skin: No rashes, lesions or ulcers. ?Psychiatry: Judgement and insight appear normal. Mood & affect  appropriate.  ? ?Data Reviewed: I have personally reviewed following labs and imaging studies ? ?CBC: ?Recent Labs  ?Lab 02/01/22 ?0748 02/01/22 ?6195 02/01/22 ?1135 02/02/22 ?0932  ?WBC 17.8*  --   --  10.8*  ?NEUTROABS 14.3*  --   --   --   ?HGB 9.1* 9.5* 7.6* 8.4*  ?HCT 28.4* 28.0* 23.8* 27.6*  ?MCV 95.9  --   --  93.9  ?PLT 240  --   --  125*  ? ? ?Basic Metabolic Panel: ?Recent Labs  ?Lab 02/01/22 ?0748 02/01/22 ?6712 02/02/22 ?4580  ?NA 140 139 140  ?K 4.5 4.5 3.8  ?CL 107 106 111  ?CO2 23  --  24  ?GLUCOSE 250* 240* 102*  ?BUN 48* 43* 17  ?CREATININE 0.51 0.50 0.37*  ?CALCIUM 8.7*  --  7.8*  ?MG  --   --  2.0  ? ? ?CBG: ?Recent Labs  ?Lab 02/01/22 ?1219  ?GLUCAP 130*  ? ? ?No results found for this or any previous visit (from the past 240 hour(s)).  ? ?Radiology Studies: ?No results found. ? ?Scheduled Meds: ? sodium chloride   Intravenous Once  ? fluticasone furoate-vilanterol  1 puff Inhalation Daily  ? And  ? umeclidinium bromide  1 puff Inhalation Daily  ? montelukast  10 mg Oral QHS  ? pantoprazole (PROTONIX) IV  40 mg Intravenous Q12H  ? sertraline  25 mg Oral QHS  ? simvastatin  20 mg Oral q1800  ? ?Continuous Infusions: ? lactated ringers 50 mL/hr at 02/02/22 1318  ? ? ? LOS: 1 day  ? ?Time spent: 36 mins ? ?Irwin Brakeman, MD ?How to contact the [facility] Attending or Consulting provider [place] or covering provider during after hours [place], for this patient?  ?Check the care team in [facility] and look for a) attending/consulting [facility] provider listed and b) the [facility] team listed ?Log into [email or web address] and use 's universal password to access. If you do not have the password, please contact the hospital operator. ?Locate the [facility] provider you are looking for under [facility] and page to a number that you can be directly reached. ?If you still have difficulty reaching the provider, please page the [facility] (Director on Call) for the Hospitalists listed on amion for assistance. ? ?02/02/2022, 1:18 PM   ? ? ?

## 2022-02-02 NOTE — Progress Notes (Signed)
We will proceed with EGD and possible enteroscopy as scheduled.  I thoroughly discussed with the patient his procedure, including the risks involved. Patient understands what the procedure involves including the benefits and any risks. Patient understands alternatives to the proposed procedure. Risks including (but not limited to) bleeding, tearing of the lining (perforation), rupture of adjacent organs, problems with heart and lung function, infection, and medication reactions. A small percentage of complications may require surgery, hospitalization, repeat endoscopic procedure, and/or transfusion.  Patient understood and agreed. ? ?Alexa Peppers, MD ?Gastroenterology and Hepatology ?[facility] ? ?

## 2022-02-02 NOTE — Op Note (Addendum)
[facility] ?Patient Name: Alexa Nichols ?Procedure Date: 02/02/2022 2:47 PM ?MRN: [id number] ?Date of Birth: 1946/01/13 ?Attending MD: [name] ,  ?CSN: [id number] ?Age: 76 ?Admit Type: Outpatient ?Procedure:                Upper GI endoscopy ?Indications:              Hematemesis ?Providers:                [name], Charlsie Quest Theda Sers RN, RN, Kenney Houseman  ?                          [place] ?Referring MD:              ?Medicines:                Monitored Anesthesia Care ?Complications:            No immediate complications. ?Estimated Blood Loss:     Estimated blood loss: none. ?Procedure:                Pre-Anesthesia Assessment: ?                          - Prior to the procedure, a History and Physical  ?                          was performed, and patient medications, allergies  ?                          and sensitivities were reviewed. The patient's  ?                          tolerance of previous anesthesia was reviewed. ?                          - The risks and benefits of the procedure and the  ?                          sedation options and risks were discussed with the  ?                          patient. All questions were answered and informed  ?                          consent was obtained. ?                          After obtaining informed consent, the endoscope was  ?                          passed under direct vision. Throughout the  ?                          procedure, the patient's blood pressure, pulse, and  ?                          oxygen saturations were monitored continuously. The  ?  GIF-H190 ([id number]) scope was introduced through the  ?                          mouth, and advanced to the second part of duodenum.  ?                          The upper GI endoscopy was accomplished without  ?                          difficulty. The patient tolerated the procedure  ?                          well. ?Scope In: 3:02:29 PM ?Scope Out: 3:14:59 PM ?Total  Procedure Duration: 0 hours 12 minutes 30 seconds  ?Findings: ?     A 6 mm bleeding Mallory-Weiss linear tear with stigmata of recent  ?     bleeding was found (adhered clot) - there was evidence of ongoing oozing  ?     after removing the clot.. For hemostasis, one hemostatic clip was  ?     successfully placed. There was no bleeding at the end of the procedure. ?     Hematin (altered blood/coffee-ground-like material) was found in the  ?     entire examined stomach. ?     A single 4 mm angiodysplastic lesion with stigmata of recent bleeding  ?     was found on the greater curvature of the stomach. Coagulation for  ?     bleeding prevention using argon beam at 0.3 liters/minute and 20 watts  ?     was successful. ?     The examined duodenum was normal. ?Impression:               - Mallory-Weiss tear. Clip was placed. ?                          - Hematin (altered blood/coffee-ground-like  ?                          material) in the entire stomach. ?                          - A single recently bleeding angiodysplastic lesion  ?                          in the stomach. Treated with argon beam coagulation. ?                          - Normal examined duodenum. ?                          - No specimens collected. ?Moderate Sedation: ?     Per Anesthesia Care ?Recommendation:           - Return patient to hospital ward for ongoing care. ?                          - Resume previous diet. ?                          -  Check H/H in AM. ?                          - Pantoprazole 40 mg tweice a day PO ?Procedure Code(s):        --- Professional --- ?                          70263, Esophagogastroduodenoscopy, flexible,  ?                          transoral; with control of bleeding, any method ?Diagnosis Code(s):        --- Professional --- ?                          K22.6, Gastro-esophageal laceration-hemorrhage  ?                          syndrome ?                          K92.2, Gastrointestinal hemorrhage, unspecified ?                           K31.811, Angiodysplasia of stomach and duodenum  ?                          with bleeding ?                          K92.0, Hematemesis ?CPT copyright 2019 [employer] Medical Association. All rights reserved. ?The codes documented in this report are preliminary and upon coder review may  ?be revised to meet current compliance requirements. ?[name], MD ?[name],  ?02/02/2022 3:23:05 PM ?This report has been signed electronically. ?Number of Addenda: 0 ?

## 2022-02-02 NOTE — TOC Progression Note (Signed)
?  Transition of Care (TOC) Screening Note ? ? ?Patient Details  ?Name: Alexa Nichols ?Date of Birth: 20-Jan-1946 ? ? ?Transition of Care (TOC) CM/SW Contact:    ?Boneta Lucks, RN ?Phone Number: ?02/02/2022, 10:27 AM ? ? ? ?Transition of Care Department [facility]) has reviewed patient and no TOC needs have been identified at this time. We will continue to monitor patient advancement through interdisciplinary progression rounds. If new patient transition needs arise, please place a TOC consult. ? ? ? ?  ?Barriers to Discharge: Continued Medical Work up ?  ?  ?  ?  ?  ?

## 2022-02-03 ENCOUNTER — Other Ambulatory Visit (INDEPENDENT_AMBULATORY_CARE_PROVIDER_SITE_OTHER): Payer: Self-pay | Admitting: *Deleted

## 2022-02-03 ENCOUNTER — Telehealth (INDEPENDENT_AMBULATORY_CARE_PROVIDER_SITE_OTHER): Payer: Self-pay | Admitting: Gastroenterology

## 2022-02-03 DIAGNOSIS — D649 Anemia, unspecified: Secondary | ICD-10-CM

## 2022-02-03 LAB — CBC
HCT: 26.8 % — ABNORMAL LOW (ref 36.0–46.0)
Hemoglobin: 8.2 g/dL — ABNORMAL LOW (ref 12.0–15.0)
MCH: 28.7 pg (ref 26.0–34.0)
MCHC: 30.6 g/dL (ref 30.0–36.0)
MCV: 93.7 fL (ref 80.0–100.0)
Platelets: 128 10*3/uL — ABNORMAL LOW (ref 150–400)
RBC: 2.86 MIL/uL — ABNORMAL LOW (ref 3.87–5.11)
RDW: 14.4 % (ref 11.5–15.5)
WBC: 11.1 10*3/uL — ABNORMAL HIGH (ref 4.0–10.5)
nRBC: 0 % (ref 0.0–0.2)

## 2022-02-03 MED ORDER — PANTOPRAZOLE SODIUM 40 MG PO TBEC
40.0000 mg | DELAYED_RELEASE_TABLET | Freq: Two times a day (BID) | ORAL | 3 refills | Status: DC
Start: 2022-02-03 — End: 2022-11-29

## 2022-02-03 MED ORDER — FLUTICASONE-UMECLIDIN-VILANT 200-62.5-25 MCG/ACT IN AEPB: 1.0000 | INHALATION_SPRAY | Freq: Every day | RESPIRATORY_TRACT | 3 refills | Status: DC

## 2022-02-03 MED ORDER — ONDANSETRON HCL 4 MG PO TABS: 4.0000 mg | ORAL_TABLET | Freq: Four times a day (QID) | ORAL | 0 refills | Status: DC | PRN

## 2022-02-03 MED ORDER — FERROUS SULFATE 325 (65 FE) MG PO TBEC: 325.0000 mg | DELAYED_RELEASE_TABLET | Freq: Two times a day (BID) | ORAL | 3 refills | Status: DC

## 2022-02-03 MED ORDER — VITAMIN B-12 1000 MCG PO TABS: 1000.0000 ug | ORAL_TABLET | Freq: Every day | ORAL | 3 refills | Status: AC

## 2022-02-03 NOTE — Progress Notes (Signed)
?Subjective: ?Denies abdominal pain, nausea, or vomiting. No BM today, last was yesterday evening with black stool, no BRBPR. Still feeling somewhat fatigued, denies dizziness, had some very mild SOB but thinks this may be related to the phentanyl she got for pain last night. States overall she is feeling pretty good, eating lunch and tolerating it well. ? ?Objective: ?Vital signs in last 24 hours: ?Temp:  [97.7 ?F (36.5 ?C)-99 ?F (37.2 ?C)] 98.6 ?F (37 ?C) (05/03 5993) ?Pulse Rate:  [75-89] 89 (05/03 5701) ?Resp:  [13-20] 18 (05/03 7793) ?BP: (107-138)/(56-70) 138/61 (05/03 9030) ?SpO2:  [93 %-98 %] 93 % (05/03 0810) ?Last BM Date : 02/01/22 ?General:   Alert and oriented, pleasant ?Head:  Normocephalic and atraumatic. ?Eyes:  No icterus, sclera clear. Conjuctiva pink.  ?Mouth:  Without lesions, mucosa pink and moist.  ?Heart:  S1, S2 present, no murmurs noted.  ?Lungs: Clear to auscultation bilaterally, without wheezing, rales, or rhonchi.  ?Abdomen:  Bowel sounds present, soft, non-tender, non-distended. No HSM or hernias noted. No rebound or guarding. No masses appreciated  ?Msk:  Symmetrical without gross deformities. Normal posture. ?Pulses:  Normal pulses noted. ?Extremities:  Without clubbing or edema. ?Neurologic:  Alert and  oriented x4;  grossly normal neurologically. ?Skin:  Warm and dry, intact without significant lesions.  ?Psych:  Alert and cooperative. Normal mood and affect. ? ?Intake/Output from previous day: ?05/02 0701 - 05/03 0700 ?In: 980 [P.O.:480; I.V.:500] ?Out: 1100 [Urine:1100] ?Intake/Output this shift: ?Total I/O ?In: 240 [P.O.:240] ?Out: -  ? ?Lab Results: ?Recent Labs  ?  02/01/22 ?0748 02/01/22 ?0923 02/01/22 ?1135 02/02/22 ?3007 02/03/22 ?6226  ?WBC 17.8*  --   --  10.8* 11.1*  ?HGB 9.1*   < > 7.6* 8.4* 8.2*  ?HCT 28.4*   < > 23.8* 27.6* 26.8*  ?PLT 240  --   --  125* 128*  ? < > = values in this interval not displayed.  ? ?BMET ?Recent Labs  ?  02/01/22 ?0748 02/01/22 ?3335  02/02/22 ?4562  ?NA 140 139 140  ?K 4.5 4.5 3.8  ?CL 107 106 111  ?CO2 23  --  24  ?GLUCOSE 250* 240* 102*  ?BUN 48* 43* 17  ?CREATININE 0.51 0.50 0.37*  ?CALCIUM 8.7*  --  7.8*  ? ?LFT ?Recent Labs  ?  02/01/22 ?0748 02/02/22 ?5638  ?PROT 6.7 5.2*  ?ALBUMIN 3.8 3.0*  ?AST 19 17  ?ALT 17 14  ?ALKPHOS 71 50  ?BILITOT 0.4 0.2*  ? ?Assessment: ?Alexa Nichols is a 76  year old female with prior history of Guillain-Barre, COPD, asthma, GERD, history of IDA with prior evaluation May 2022 while inpatient revealing gastric AVMs s/p APC and possible small bowel AVMs on capsule study, who presented to ED with worsening fatigue/weakness, hematemesis, and acute blood loss anemia due to UGI bleed.  ? ?Acute blood loss anemia: hgb 9.1 on admission, lowest at 7.6, 8.4 today, s/p two units total on 02/01/22. Notably denies NSAID use but is on Fosamax. EGD yesterday with Mallory-Weiss tear, clip placed, Hematin in entire stomach, single, recently bleeding angiodysplastic lesion in stomach, s/p argon beam coagulation, otherwise normal exam. Recommended to continue PPI BID, continue to avoid NSAIDs. Should consider switching from Fosamax to an injectable for osteoporosis so as to help prevent further episodes of blood loss anemia given patient's history.  ? ?Plan: ?Continue PPI BID on outpatient basis ?Continue to avoid NSAIDs ?Repeat H&H in 1 week ?Follow up GI outpatient 2-3 weeks ?Consider osteoporosis  alternative treatments other than fosamax ? ?GI will sign off ? ? LOS: 2 days  ? ? 02/03/2022, 10:31 AM ? ? ?Imanie Darrow L. Alver Sorrow, MSN, APRN, AGNP-C ?Adult-Gerontology Nurse Practitioner ?[facility] ? ?

## 2022-02-03 NOTE — Discharge Instructions (Addendum)
1)Avoid ibuprofen/Advil/Aleve/Motrin/Goody Powders/Naproxen/BC powders/Meloxicam/Diclofenac/Indomethacin and other Nonsteroidal anti-inflammatory medications as these will make you more likely to bleed and can cause stomach ulcers, can also cause Kidney problems.  ? ?2)Repeat CBC and BMP Blood Tests in 1 week  ? ?3)Follow up with Dr. Jenetta Downer or Dr Laural Golden-- in 2 to 3 weeks for recheck ?-- address [address], [place] [address],,XBOER Number [phone]  ?

## 2022-02-03 NOTE — Anesthesia Postprocedure Evaluation (Signed)
Anesthesia Post Note ? ?Patient: Alexa Nichols ? ?Procedure(s) Performed: ESOPHAGOGASTRODUODENOSCOPY (EGD) WITH PROPOFOL ?ENTEROSCOPY ?HOT HEMOSTASIS (ARGON PLASMA COAGULATION/BICAP) ? ?Patient location during evaluation: Phase II ?Anesthesia Type: General ?Level of consciousness: awake ?Pain management: pain level controlled ?Vital Signs Assessment: post-procedure vital signs reviewed and stable ?Respiratory status: spontaneous breathing and respiratory function stable ?Cardiovascular status: blood pressure returned to baseline and stable ?Postop Assessment: no headache and no apparent nausea or vomiting ?Anesthetic complications: no ?Comments: Late entry ? ? ?No notable events documented. ? ? ?Last Vitals:  ?Vitals:  ? 02/02/22 2156 02/03/22 0614  ?BP: 107/68 138/61  ?Pulse: 85 89  ?Resp: 18 18  ?Temp: 36.5 ?C 37 ?C  ?SpO2: 95% 93%  ?  ?Last Pain:  ?Vitals:  ? 02/02/22 2230  ?TempSrc:   ?PainSc: Asleep  ? ? ?  ?  ?  ?  ?  ?  ? ?Louann Sjogren ? ? ? ? ?

## 2022-02-03 NOTE — Telephone Encounter (Signed)
Left message to return call. Order put in for cbc ?

## 2022-02-03 NOTE — Care Management Important Message (Signed)
Important Message ? ?Patient Details  ?Name: Alexa Nichols ?MRN: [id number] ?Date of Birth: 1946/06/07 ? ? ?Medicare Important Message Given:  N/A - LOS <3 / Initial given by admissions ? ? ? ? ?Tommy Medal ?02/03/2022, 1:48 PM ?

## 2022-02-03 NOTE — Discharge Summary (Signed)
?                                                                                ? ? ?Alexa Nichols, is a 76 y.o. female  DOB August 25, 1946  MRN [id number]. ? ?Admission date:  02/01/2022  Admitting Physician  Murlean Iba, MD ? ?Discharge Date:  02/03/2022  ? ?Primary MD  [name], MD ? ?Recommendations for primary care physician for things to follow:  ? ?1)Avoid ibuprofen/Advil/Aleve/Motrin/Goody Powders/Naproxen/BC powders/Meloxicam/Diclofenac/Indomethacin and other Nonsteroidal anti-inflammatory medications as these will make you more likely to bleed and can cause stomach ulcers, can also cause Kidney problems.  ? ?2)Repeat CBC and BMP Blood Tests in 1 week  ? ?3)Follow up with Dr. Jenetta Downer or Dr Laural Golden-- in 2 to 3 weeks for recheck ?-- address [address], [place] [address],,PNTIR Number [phone]  ? ?Admission Diagnosis  Acute upper GI hemorrhage [K92.2] ?Upper GI bleed [K92.2] ? ? ?Discharge Diagnosis  Acute upper GI hemorrhage [K92.2] ?Upper GI bleed [K92.2]   ? ?Principal Problem: ?  Acute upper GI hemorrhage ?Active Problems: ?  Guaiac positive stools ?  Hyperlipidemia ?  COPD (chronic obstructive pulmonary disease) ([place]) ?  Acute blood loss anemia ?  Constipation ?  AVM (arteriovenous malformation) of small bowel, acquired ?  AVM (arteriovenous malformation) of stomach, acquired ?  Hyperglycemia ?  Leukocytosis ?    ? ?Past Medical History:  ?Diagnosis Date  ? Asthma   ? Chronic low back pain   ? COPD (chronic obstructive pulmonary disease) ([place])   ? Depression   ? GERD (gastroesophageal reflux disease)   ? Guillain Barr? syndrome ([place])   ? Heart murmur   ? Lobular carcinoma of left breast ([place]) 1997  ? in situ  ? Other and unspecified hyperlipidemia   ? Seasonal allergies   ? ? ?Past Surgical History:  ?Procedure Laterality Date  ? BIOPSY  12/03/2020  ? Procedure: BIOPSY;  Surgeon: Rogene Houston, MD;  [facility];  Service: Endoscopy;;  gastric polyps biopsies;   ? BREAST SURGERY Left   ? lumpectomy   ? [place]  ? CHOLECYSTECTOMY    ? COLONOSCOPY WITH PROPOFOL N/A 12/03/2020  ?  two 4-7 mm polyps in cecum, one small polyp in cecum, one 5 mm polyp, sigmoid diverticulosis, path with tubular adenomas  ? ESOPHAGOGASTRODUODENOSCOPY (EGD) WITH PROPOFOL N/A 12/03/2020  ? normal esophagus, multiple gastric polyps s/p biopsy, small paraesophageal hernia, normal duodenum. Fundic gland polyps.  ? ESOPHAGOGASTRODUODENOSCOPY (EGD) WITH PROPOFOL N/A 02/05/2021  ? Procedure: ESOPHAGOGASTRODUODENOSCOPY (EGD) WITH PROPOFOL;  Surgeon: [name], MD;  [facility];  Service: Endoscopy;  Laterality: N/A;  ? ESOPHAGOGASTRODUODENOSCOPY (EGD) WITH PROPOFOL N/A 03/03/2021  ? Procedure: ESOPHAGOGASTRODUODENOSCOPY (EGD) WITH PROPOFOL;  Surgeon: [name], MD;  [facility];  Service: Gastroenterology;  Laterality: N/A;  with possible push enteroscopy utilizing the pediatric colonoscope  ? GIVENS CAPSULE STUDY N/A 12/16/2020  ? normal small bowel capsule but rapid transit time of 21 minutes. Recommended to resume alendronate.   ? GIVENS CAPSULE STUDY N/A 02/04/2021  ? Procedure: GIVENS CAPSULE STUDY;  Surgeon: Laural Golden,  Mechele Dawley, MD;  [facility];  Service: Endoscopy;  Laterality: N/A;  ? HOT HEMOSTASIS  02/05/2021  ? Procedure: HOT HEMOSTASIS (ARGON PLASMA COAGULATION/BICAP);  Surgeon: [name], MD;  [facility];  Service: Endoscopy;;  ? HOT HEMOSTASIS  03/03/2021  ? Procedure: HOT HEMOSTASIS (ARGON PLASMA COAGULATION/BICAP);  Surgeon: [name], [name], MD;  [facility];  Service: Gastroenterology;;  ? TONSILLECTOMY    ? age 8  ? ? ? ? HPI  from the history and physical done on the day of admission:  ? ?  ?Chief Complaint: vomiting blood  ?  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  She reports dark red emesis.  She reports that she takes a daily iron supplement and chronically has black stools.  No bright red blood in the stool.  No fever or chills.  No chest pain or shortness of breath.  She has been taking her home Protonix.  In the emergency room she was hypotensive and her hemoglobin was down to 9.1 and her stool tested guaiac positive.  She was given a bolus of IV fluids with improvement in her blood pressures and started on IV Protonix.  Dr. Abbey Chatters with gastroenterology service was consulted and will see in the hospital and requested admission. ? ? ? ? Hospital Course:  ? ?  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  She reports dark red emesis.  She reports that she takes a daily iron supplement and chronically has black stools.  No bright red blood in the stool.  No fever or chills.  No chest pain or shortness of breath.  She has been taking her home Protonix.  In the emergency room she was hypotensive and her hemoglobin was down to 9.1 and her stool tested guaiac positive.  She was given a bolus of IV fluids with improvement in her blood pressures and started on IV Protonix.  Dr. Abbey Chatters with gastroenterology service was consulted and will see in the hospital and requested admission. ? ?Assessment and Plan: ?* Acute upper GI hemorrhage due to Mallory-Weiss tear ?-- Treated with IV protonix  ?-- EGD on 02/02/2022 showed Mallory-Weiss  tear, clip placed, Hematin in entire stomach, single, recently bleeding angiodysplastic lesion in stomach, s/p argon beam coagulation, ?-Hemoglobin stable above 8, after 2 units  of PRBC this admission ?-Discharge home on PPI, ?-Repeat CBC and GI follow-up within a week ? ?AVM (arteriovenous malformation) of stomach, acquired ?--  please see #1 above ? ?AVM (arteriovenous malformation) of small bowel, acquired ?-- Please see #1 ? ?Acute blood loss anemia ?-- Secondary to #1 above ?=-Managed as above #1 ? ?COPD (chronic obstructive pulmonary disease) ([facility]) ?-- currently stable,  ? ?Hyperlipidemia ?-- resume home simvastatin daily  ? ?Discharge Condition: Stable ? ?Follow UP--GI ? ?  ? ?Consults obtained -GI ? ?Diet and Activity recommendation:  As advised ? ?Discharge Instructions   ? ? ?  Discharge Instructions   ? ? Call MD for:  difficulty breathing, headache or visual disturbances   Complete by: As directed ?  ? Call MD for:  persistant dizziness or light-headedness   Complete by: As directed ?  ? Call MD for:  persistant nausea and vomiting   Complete by: As directed ?  ? Call MD for:  temperature >100.4   Complete by: As directed ?  ? Diet - low sodium heart healthy   Complete by: As directed ?  ? Discharge instructions   Complete by: As directed ?  ? 1)Avoid ibuprofen/Advil/Aleve/Motrin/Goody Powders/Naproxen/BC powders/Meloxicam/Diclofenac/Indomethacin and other Nonsteroidal anti-inflammatory medications as these will make you more likely to bleed and can cause stomach ulcers, can also cause Kidney problems.  ? ?2)Repeat CBC and BMP Blood Tests in 1 week  ? ?3)Follow up with Dr. Jenetta Downer or Dr Laural Golden-- in 2 to 3 weeks for recheck ?-- address [address], [place] [address],,YCXKG Number [phone]  ? Increase activity slowly   Complete by: As directed ?  ? ?  ? ? ? ? Discharge Medications  ? ?  ?Allergies as of 02/03/2022   ? ?   Reactions  ? Aspirin   ? samter's syndrome  ? Codeine Itching   ? Sulfa Antibiotics   ? Unknown reaction  ? Other Rash  ? gel filled fentanyl patch, adhesive on that patch caused ithcing  ? ?  ? ?  ?Medication List  ?  ? ?STOP taking these medications   ? ?ferrous sul

## 2022-02-03 NOTE — Progress Notes (Signed)
Nsg Discharge Note ? ?Admit Date:  02/01/2022 ?Discharge date: 02/03/2022 ?  ?Rush Barer to be D/C'd Home per MD order.  AVS completed.  ?Patient/caregiver able to verbalize understanding. ? ?Discharge Medication: ?Allergies as of 02/03/2022   ? ?   Reactions  ? Aspirin   ? samter's syndrome  ? Codeine Itching  ? Sulfa Antibiotics   ? Unknown reaction  ? Other Rash  ? gel filled fentanyl patch, adhesive on that patch caused ithcing  ? ?  ? ?  ?Medication List  ?  ? ?STOP taking these medications   ? ?ferrous sulfate 325 (65 FE) MG tablet ?Replaced by: ferrous sulfate 325 (65 FE) MG EC tablet ?  ? ?  ? ?TAKE these medications   ? ?acetaminophen 500 MG tablet ?Commonly known as: TYLENOL ?Take 500 mg by mouth every 6 (six) hours as needed for moderate pain or headache. ?  ?alendronate 70 MG tablet ?Commonly known as: FOSAMAX ?Take 70 mg by mouth once a week. ?  ?ALPRAZolam 0.5 MG tablet ?Commonly known as: Duanne Moron ?Take 0.25-0.5 mg by mouth daily as needed. ?  ?ferrous sulfate 325 (65 FE) MG EC tablet ?Take 1 tablet (325 mg total) by mouth 2 (two) times daily with a meal. ?Replaces: ferrous sulfate 325 (65 FE) MG tablet ?  ?Fluticasone-Umeclidin-Vilant 200-62.5-25 MCG/ACT Aepb ?Commonly known as: Trelegy Ellipta ?Inhale 1 puff into the lungs daily. ?What changed: medication strength ?  ?HYDROcodone-acetaminophen 5-325 MG tablet ?Commonly known as: NORCO/VICODIN ?Take 1 tablet by mouth 2 (two) times daily as needed. ?  ?montelukast 10 MG tablet ?Commonly known as: SINGULAIR ?Take 10 mg by mouth at bedtime. ?  ?ondansetron 4 MG tablet ?Commonly known as: ZOFRAN ?Take 1 tablet (4 mg total) by mouth every 6 (six) hours as needed for nausea. ?  ?pantoprazole 40 MG tablet ?Commonly known as: Protonix ?Take 1 tablet (40 mg total) by mouth 2 (two) times daily. ?What changed: when to take this ?  ?PreserVision AREDS 2 Caps ?Take 1 capsule by mouth in the morning and at bedtime. ?  ?sertraline 25 MG tablet ?Commonly known as:  ZOLOFT ?Take 25 mg by mouth daily. ?  ?simvastatin 20 MG tablet ?Commonly known as: ZOCOR ?Take 20 mg by mouth daily. ?  ?vitamin B-12 1000 MCG tablet ?Commonly known as: CYANOCOBALAMIN ?Take 1 tablet (1,000 mcg total) by mouth daily. ?What changed:  ?medication strength ?how much to take ?  ? ?  ? ? ?Discharge Assessment: ?Vitals:  ? 02/03/22 0810 02/03/22 1346  ?BP:  (!) 141/60  ?Pulse:  94  ?Resp:  18  ?Temp:  98.3 ?F (36.8 ?C)  ?SpO2: 93% 97%  ? Skin clean, dry and intact without evidence of skin break down, no evidence of skin tears noted. ?IV catheter discontinued intact. Site without signs and symptoms of complications - no redness or edema noted at insertion site, patient denies c/o pain - only slight tenderness at site.  Dressing with slight pressure applied. ? ?D/c Instructions-Education: ?Discharge instructions given to patient/family with verbalized understanding. ?D/c education completed with patient/family including follow up instructions, medication list, d/c activities limitations if indicated, with other d/c instructions as indicated by MD - patient able to verbalize understanding, all questions fully answered. ?Patient instructed to return to ED, call 911, or call MD for any changes in condition.  ?Patient escorted via [place], and D/C home via private auto. ? ?Kathie Rhodes, RN ?02/03/2022 1:50 PM  ?

## 2022-02-04 ENCOUNTER — Telehealth (INDEPENDENT_AMBULATORY_CARE_PROVIDER_SITE_OTHER): Payer: Self-pay

## 2022-02-04 ENCOUNTER — Encounter (HOSPITAL_COMMUNITY): Payer: Self-pay

## 2022-02-04 ENCOUNTER — Emergency Department (HOSPITAL_COMMUNITY): Payer: Medicare Other

## 2022-02-04 ENCOUNTER — Observation Stay (HOSPITAL_COMMUNITY)
Admission: EM | Admit: 2022-02-04 | Discharge: 2022-02-05 | Disposition: A | Discharge status: Home / Self Care | Payer: Medicare Other | Attending: Family Medicine | Admitting: Family Medicine

## 2022-02-04 ENCOUNTER — Other Ambulatory Visit: Payer: Self-pay

## 2022-02-04 DIAGNOSIS — I5033 Acute on chronic diastolic (congestive) heart failure: Principal | ICD-10-CM | POA: Insufficient documentation

## 2022-02-04 DIAGNOSIS — Z87891 Personal history of nicotine dependence: Secondary | ICD-10-CM | POA: Insufficient documentation

## 2022-02-04 DIAGNOSIS — E876 Hypokalemia: Secondary | ICD-10-CM | POA: Diagnosis not present

## 2022-02-04 DIAGNOSIS — J45909 Unspecified asthma, uncomplicated: Secondary | ICD-10-CM | POA: Diagnosis not present

## 2022-02-04 DIAGNOSIS — J9601 Acute respiratory failure with hypoxia: Secondary | ICD-10-CM

## 2022-02-04 DIAGNOSIS — R0689 Other abnormalities of breathing: Secondary | ICD-10-CM | POA: Diagnosis present

## 2022-02-04 DIAGNOSIS — D62 Acute posthemorrhagic anemia: Secondary | ICD-10-CM | POA: Diagnosis not present

## 2022-02-04 DIAGNOSIS — J90 Pleural effusion, not elsewhere classified: Secondary | ICD-10-CM | POA: Diagnosis not present

## 2022-02-04 DIAGNOSIS — R0602 Shortness of breath: Secondary | ICD-10-CM | POA: Diagnosis not present

## 2022-02-04 DIAGNOSIS — I509 Heart failure, unspecified: Secondary | ICD-10-CM

## 2022-02-04 DIAGNOSIS — Z20822 Contact with and (suspected) exposure to covid-19: Secondary | ICD-10-CM | POA: Diagnosis not present

## 2022-02-04 DIAGNOSIS — Z853 Personal history of malignant neoplasm of breast: Secondary | ICD-10-CM | POA: Diagnosis not present

## 2022-02-04 DIAGNOSIS — J811 Chronic pulmonary edema: Secondary | ICD-10-CM | POA: Diagnosis not present

## 2022-02-04 DIAGNOSIS — D649 Anemia, unspecified: Secondary | ICD-10-CM | POA: Diagnosis not present

## 2022-02-04 DIAGNOSIS — Z79899 Other long term (current) drug therapy: Secondary | ICD-10-CM | POA: Insufficient documentation

## 2022-02-04 DIAGNOSIS — J9811 Atelectasis: Secondary | ICD-10-CM | POA: Diagnosis not present

## 2022-02-04 DIAGNOSIS — J449 Chronic obstructive pulmonary disease, unspecified: Secondary | ICD-10-CM | POA: Diagnosis not present

## 2022-02-04 LAB — CBC WITH DIFFERENTIAL/PLATELET
Abs Immature Granulocytes: 0.06 10*3/uL (ref 0.00–0.07)
Basophils Absolute: 0 10*3/uL (ref 0.0–0.1)
Basophils Relative: 0 %
Eosinophils Absolute: 0.1 10*3/uL (ref 0.0–0.5)
Eosinophils Relative: 0 %
HCT: 28.4 % — ABNORMAL LOW (ref 36.0–46.0)
Hemoglobin: 8.9 g/dL — ABNORMAL LOW (ref 12.0–15.0)
Immature Granulocytes: 1 %
Lymphocytes Relative: 10 %
Lymphs Abs: 1.2 10*3/uL (ref 0.7–4.0)
MCH: 30 pg (ref 26.0–34.0)
MCHC: 31.3 g/dL (ref 30.0–36.0)
MCV: 95.6 fL (ref 80.0–100.0)
Monocytes Absolute: 1.1 10*3/uL — ABNORMAL HIGH (ref 0.1–1.0)
Monocytes Relative: 9 %
Neutro Abs: 9.3 10*3/uL — ABNORMAL HIGH (ref 1.7–7.7)
Neutrophils Relative %: 80 %
Platelets: 170 10*3/uL (ref 150–400)
RBC: 2.97 MIL/uL — ABNORMAL LOW (ref 3.87–5.11)
RDW: 14.6 % (ref 11.5–15.5)
WBC: 11.8 10*3/uL — ABNORMAL HIGH (ref 4.0–10.5)
nRBC: 0 % (ref 0.0–0.2)

## 2022-02-04 LAB — BASIC METABOLIC PANEL
Anion gap: 7 (ref 5–15)
BUN: 5 mg/dL — ABNORMAL LOW (ref 8–23)
CO2: 26 mmol/L (ref 22–32)
Calcium: 8.4 mg/dL — ABNORMAL LOW (ref 8.9–10.3)
Chloride: 105 mmol/L (ref 98–111)
Creatinine, Ser: 0.34 mg/dL — ABNORMAL LOW (ref 0.44–1.00)
GFR, Estimated: >60 mL/min (ref >60)
Glucose, Bld: 105 mg/dL — ABNORMAL HIGH (ref 70–99)
Potassium: 2.9 mmol/L — ABNORMAL LOW (ref 3.5–5.1)
Sodium: 138 mmol/L (ref 135–145)

## 2022-02-04 LAB — TROPONIN I (HIGH SENSITIVITY): Troponin I (High Sensitivity): 73 ng/L — ABNORMAL HIGH (ref <18)

## 2022-02-04 LAB — MAGNESIUM: Magnesium: 2.2 mg/dL (ref 1.7–2.4)

## 2022-02-04 LAB — RESP PANEL BY RT-PCR (FLU A&B, COVID) ARPGX2
Influenza A by PCR: NEGATIVE
Influenza B by PCR: NEGATIVE
SARS Coronavirus 2 by RT PCR: NEGATIVE

## 2022-02-04 LAB — BRAIN NATRIURETIC PEPTIDE: B Natriuretic Peptide: 201 pg/mL — ABNORMAL HIGH (ref 0.0–100.0)

## 2022-02-04 IMAGING — CT CT ANGIO CHEST
2 of 7 series · 16 of 36 positions shown · IV contrast (Omnipaque or Isovue)
Comparison: Chest radiography same day.  Prior CT [VG].

CLINICAL DATA: Pulmonary emboli suspected. Shortness of breath with
exertion over the last 2 days.

EXAM:
CT ANGIOGRAPHY CHEST WITH CONTRAST
TECHNIQUE: Multidetector CT imaging of the chest was performed using the
standard protocol during bolus administration of intravenous
contrast. Multiplanar CT image reconstructions and MIPs were
obtained to evaluate the vascular anatomy.

[Series 5: pe axial thins · axial · 0.56mm/px · z∈[+1164,+1410]mm · 15 of 353 slices shown]
[im 23/353  lung]
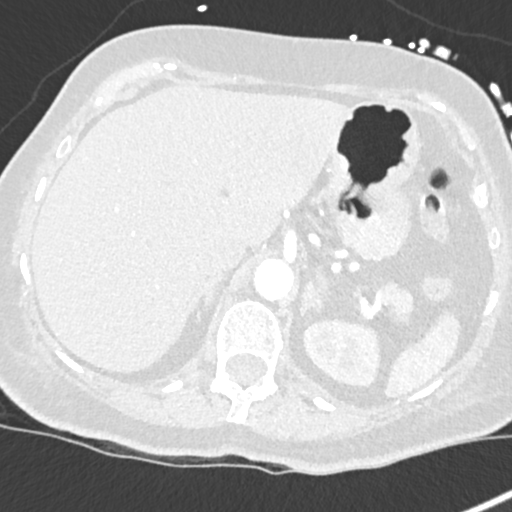
[im 45/353  mediastinal]
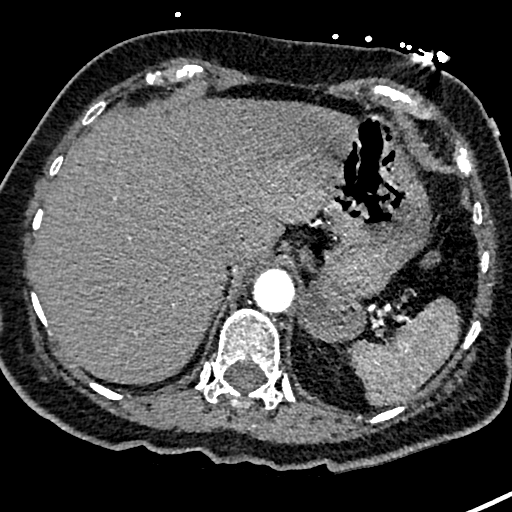
[im 67/353  lung]
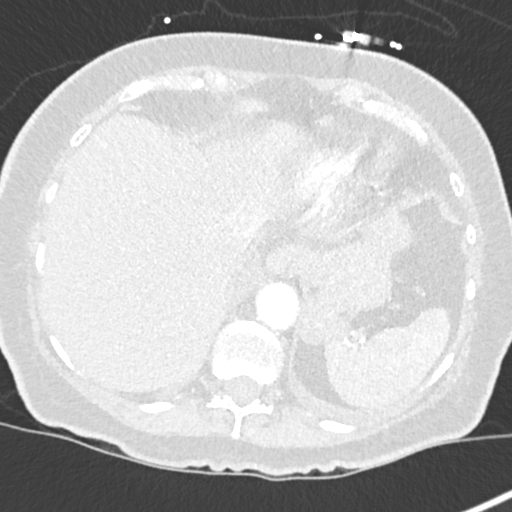
[im 89/353  mediastinal]
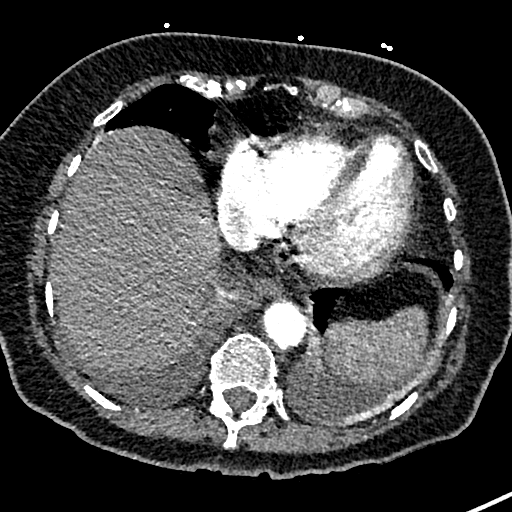
[im 111/353  lung]
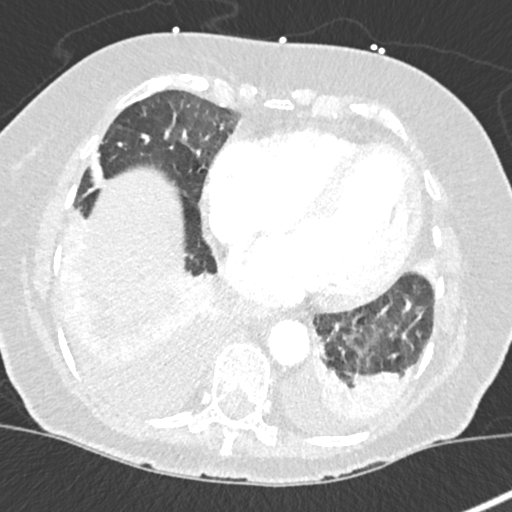
[im 133/353  mediastinal]
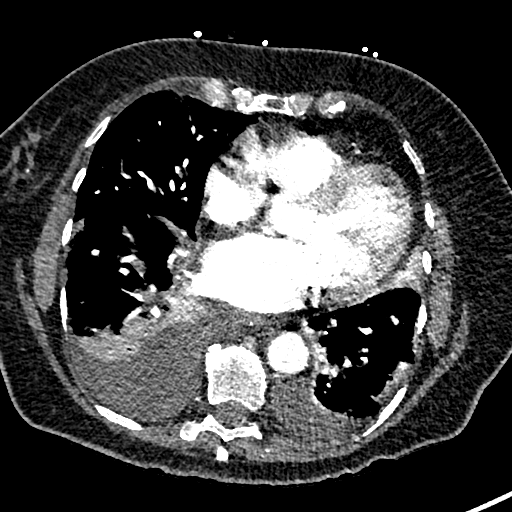
[im 155/353  lung]
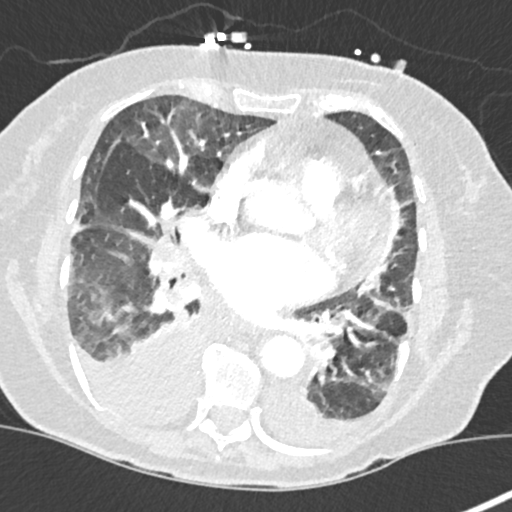
[im 177/353  mediastinal]
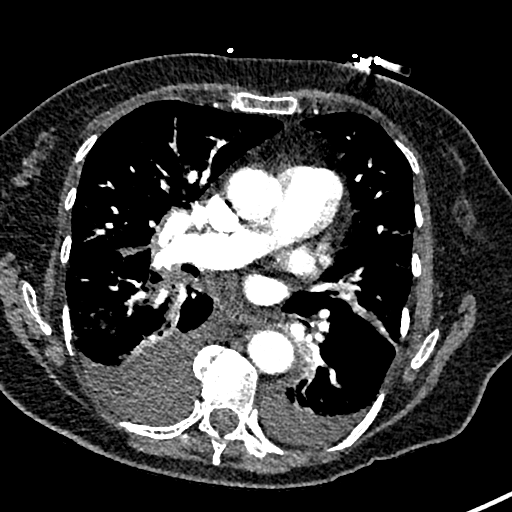
[im 199/353  lung]
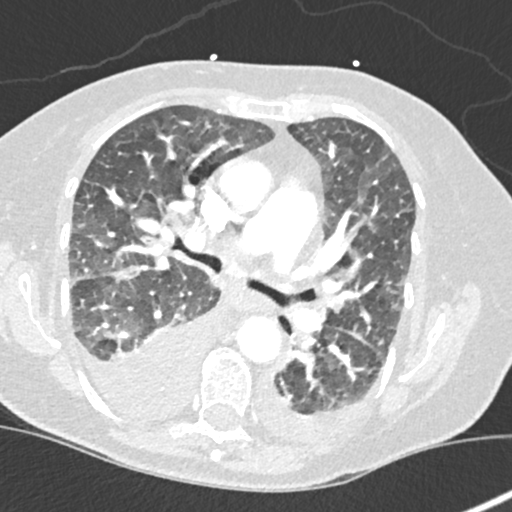
[im 221/353  mediastinal]
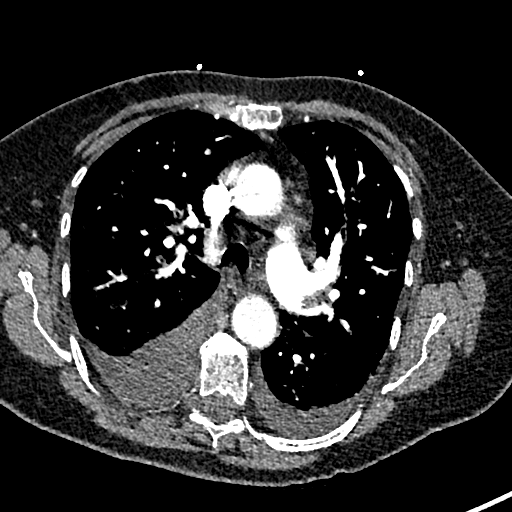
[im 243/353  lung]
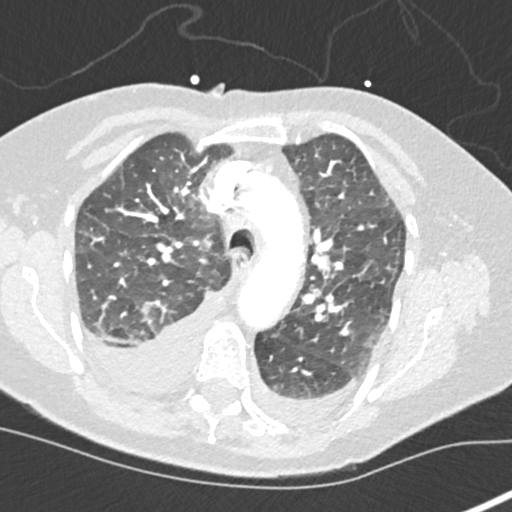
[im 265/353  mediastinal]
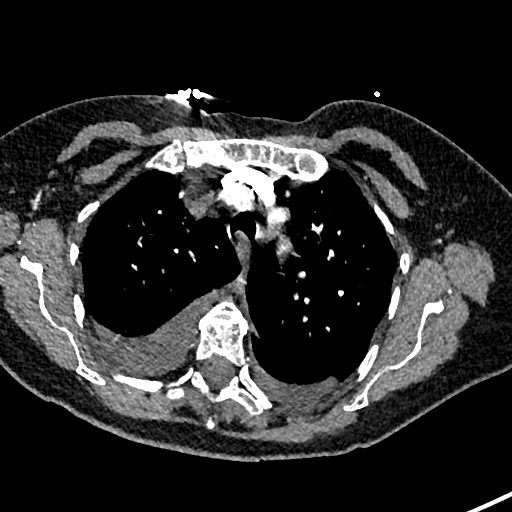
[im 287/353  lung]
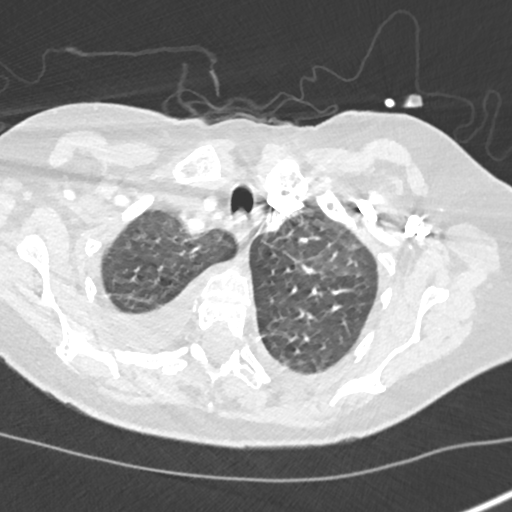
[im 309/353  mediastinal]
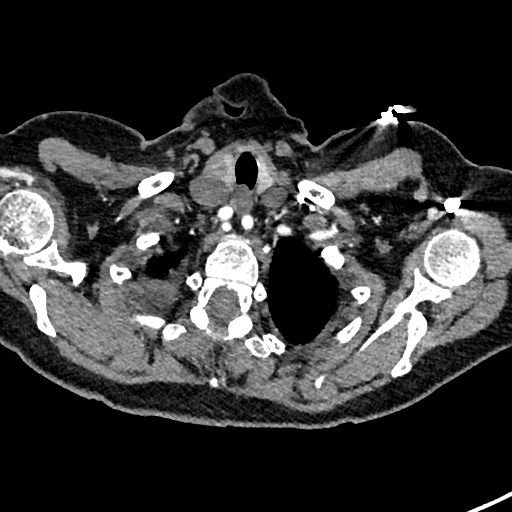
[im 331/353  lung]
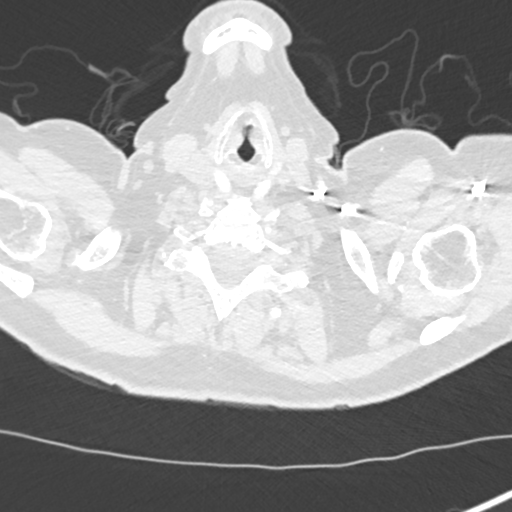

[Series 7: cor soft · coronal · 0.56mm/px · 1 of 151 slices shown]
[im 76/151  mediastinal]
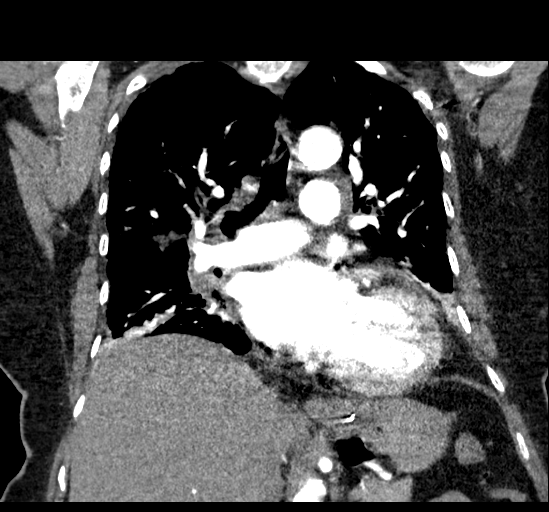

[16 of 36 positions shown; findings below may reference images not displayed]

RADIATION DOSE REDUCTION: This exam was performed according to the
departmental dose-optimization program which includes automated
exposure control, adjustment of the mA and/or kV according to
patient size and/or use of iterative reconstruction technique.

CONTRAST:  75mL OMNIPAQUE IOHEXOL 350 MG/ML SOLN
FINDINGS: Cardiovascular: Heart size upper limits of normal, but with left
atrial prominence. No pericardial effusion. No visible coronary
artery calcification. Aortic atherosclerotic calcification is
present. Pulmonary arterial opacification is good. There are no
pulmonary emboli.

Mediastinum/Nodes: No mediastinal or hilar mass or lymphadenopathy.

Lungs/Pleura: There are bilateral pleural effusions layering
dependently, larger on the right than the left, with dependent
atelectasis. There is interstitial pulmonary edema.

Upper Abdomen: Negative

Musculoskeletal: Thoracic kyphosis with bridging osteophytes.

Review of the MIP images confirms the above findings.
IMPRESSION: No pulmonary emboli.

Congestive heart failure with interstitial edema, bilateral
effusions right larger than left, and dependent pulmonary
atelectasis.

Aortic Atherosclerosis ([VG]-[VG]).

## 2022-02-04 IMAGING — DX DG CHEST 1V PORT
1 series · 1 of 1 positions shown · non-contrast
Comparison: [DATE].

CLINICAL DATA: Shortness of breath.

EXAM:
PORTABLE CHEST 1 VIEW

[chest ap]
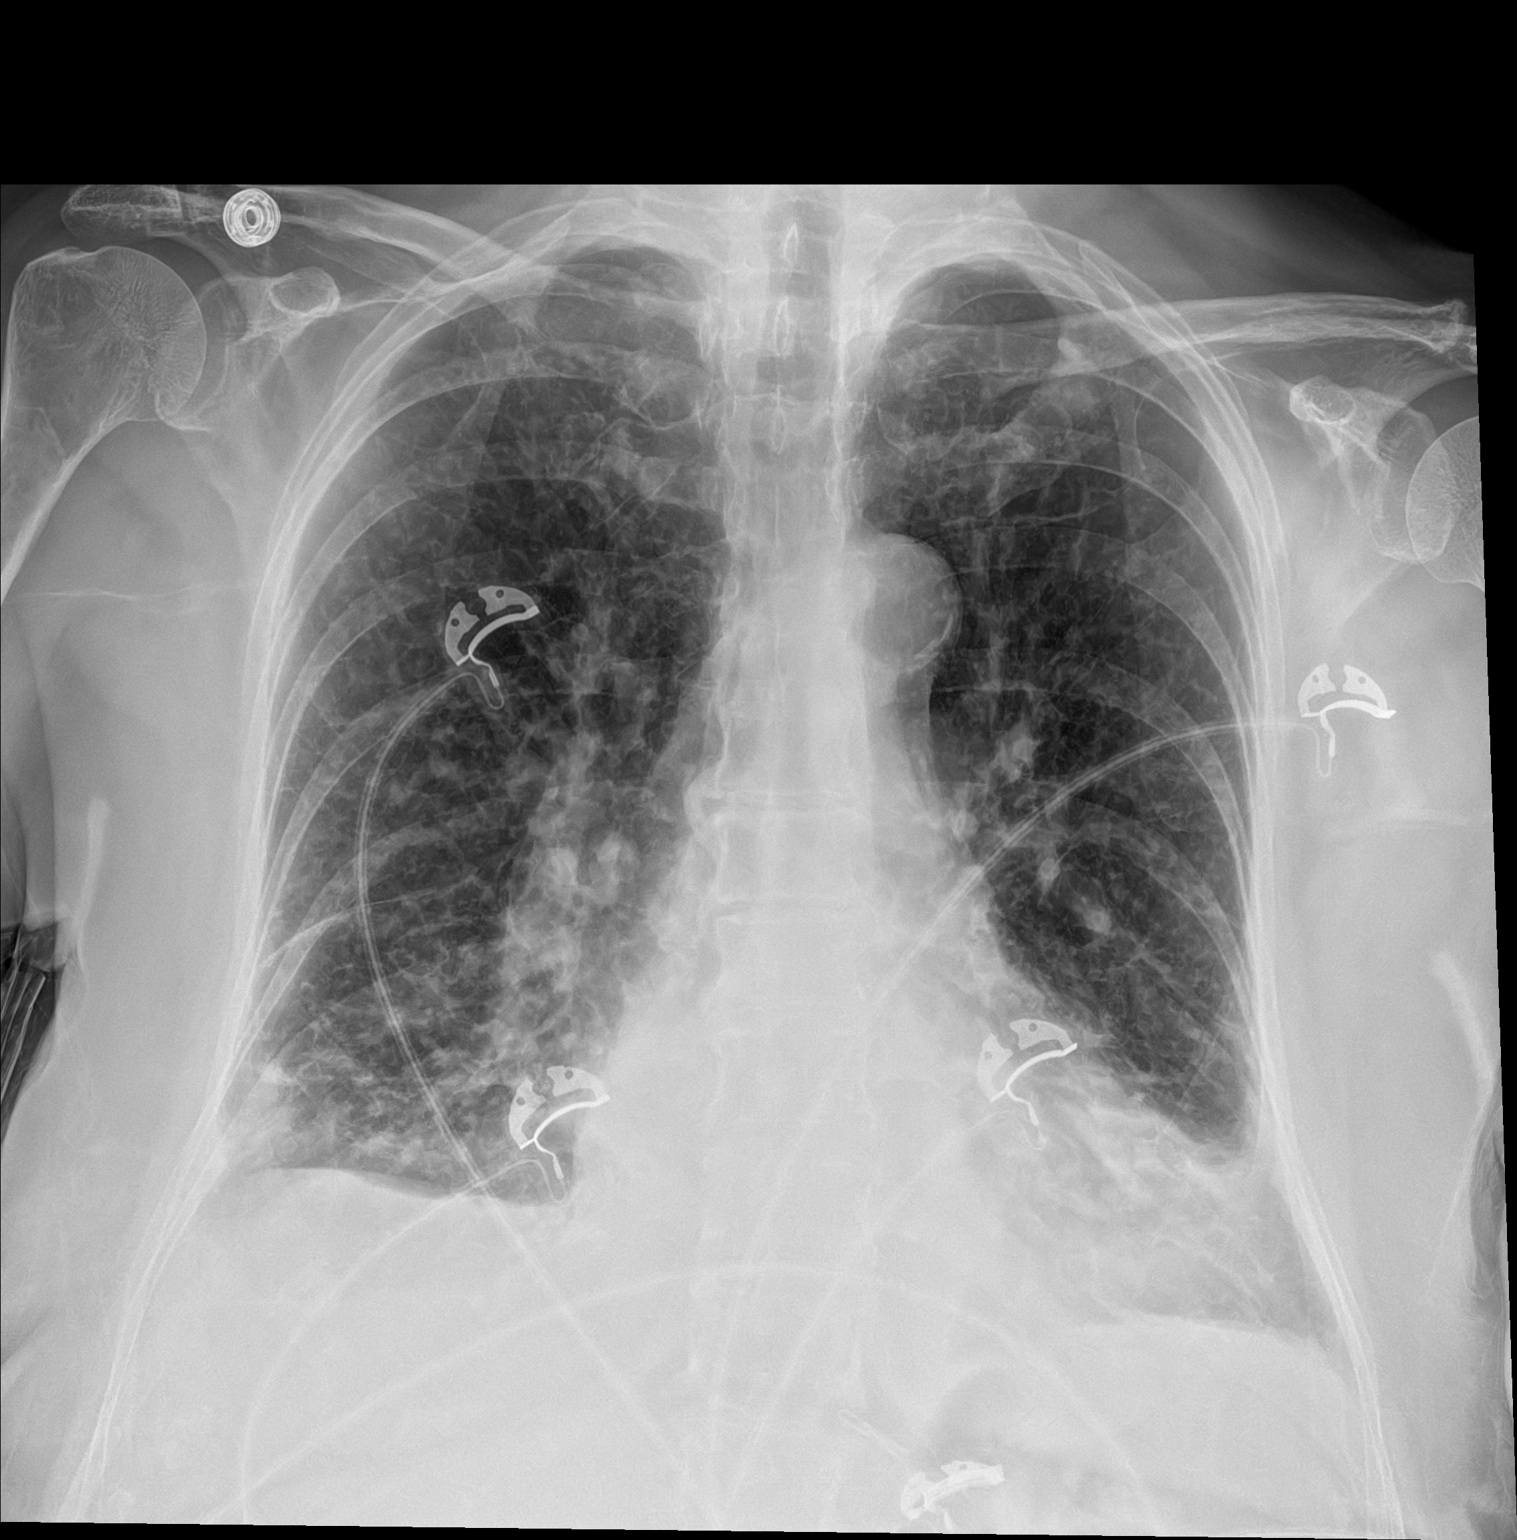

[1 of 1 positions shown; findings below may reference images not displayed]

FINDINGS: The heart size and mediastinal contours are within normal limits.
Mild bibasilar subsegmental atelectasis or infiltrates are noted
with possible small pleural effusions. The visualized skeletal
structures are unremarkable.
IMPRESSION: Mild bibasilar subsegmental atelectasis or infiltrates are noted
with possible small pleural effusions.

## 2022-02-04 MED ORDER — FUROSEMIDE 10 MG/ML IJ SOLN
40.0000 mg | Freq: Once | INTRAMUSCULAR | Status: AC
  Administered 2022-02-04: 40 mg via INTRAVENOUS
  Filled 2022-02-04: qty 4

## 2022-02-04 MED ORDER — ALPRAZOLAM 0.5 MG PO TABS
0.2500 mg | ORAL_TABLET | Freq: Every evening | ORAL | Status: DC | PRN
  Administered 2022-02-04: 0.5 mg via ORAL
  Filled 2022-02-04: qty 1

## 2022-02-04 MED ORDER — PANTOPRAZOLE SODIUM 40 MG PO TBEC
40.0000 mg | DELAYED_RELEASE_TABLET | Freq: Two times a day (BID) | ORAL | Status: DC
  Administered 2022-02-04 – 2022-02-05 (×2): 40 mg via ORAL
  Filled 2022-02-04 (×2): qty 1

## 2022-02-04 MED ORDER — ACETAMINOPHEN 325 MG PO TABS: 650.0000 mg | ORAL_TABLET | Freq: Four times a day (QID) | ORAL | Status: DC | PRN

## 2022-02-04 MED ORDER — MONTELUKAST SODIUM 10 MG PO TABS
10.0000 mg | ORAL_TABLET | Freq: Every day | ORAL | Status: DC
  Administered 2022-02-04: 10 mg via ORAL
  Filled 2022-02-04: qty 1

## 2022-02-04 MED ORDER — FUROSEMIDE 10 MG/ML IJ SOLN
40.0000 mg | Freq: Two times a day (BID) | INTRAMUSCULAR | Status: DC
  Administered 2022-02-05: 40 mg via INTRAVENOUS
  Filled 2022-02-04: qty 4

## 2022-02-04 MED ORDER — IOHEXOL 350 MG/ML SOLN
75.0000 mL | Freq: Once | INTRAVENOUS | Status: AC | PRN
  Administered 2022-02-04: 75 mL via INTRAVENOUS

## 2022-02-04 MED ORDER — POLYETHYLENE GLYCOL 3350 17 G PO PACK: 17.0000 g | PACK | Freq: Every day | ORAL | Status: DC | PRN

## 2022-02-04 MED ORDER — SERTRALINE HCL 50 MG PO TABS
25.0000 mg | ORAL_TABLET | Freq: Every day | ORAL | Status: DC
  Administered 2022-02-04 – 2022-02-05 (×2): 25 mg via ORAL
  Filled 2022-02-04 (×2): qty 1

## 2022-02-04 MED ORDER — POTASSIUM CHLORIDE CRYS ER 20 MEQ PO TBCR
40.0000 meq | EXTENDED_RELEASE_TABLET | ORAL | Status: AC
  Administered 2022-02-04 (×2): 40 meq via ORAL
  Filled 2022-02-04 (×2): qty 2

## 2022-02-04 MED ORDER — POTASSIUM CHLORIDE CRYS ER 20 MEQ PO TBCR
40.0000 meq | EXTENDED_RELEASE_TABLET | Freq: Once | ORAL | Status: AC
  Administered 2022-02-04: 40 meq via ORAL
  Filled 2022-02-04: qty 2

## 2022-02-04 MED ORDER — ONDANSETRON HCL 4 MG/2ML IJ SOLN: 4.0000 mg | Freq: Four times a day (QID) | INTRAMUSCULAR | Status: DC | PRN

## 2022-02-04 MED ORDER — ONDANSETRON HCL 4 MG PO TABS: 4.0000 mg | ORAL_TABLET | Freq: Four times a day (QID) | ORAL | Status: DC | PRN

## 2022-02-04 MED ORDER — HYDROCODONE-ACETAMINOPHEN 5-325 MG PO TABS: 1.0000 | ORAL_TABLET | Freq: Two times a day (BID) | ORAL | Status: DC | PRN

## 2022-02-04 MED ORDER — ACETAMINOPHEN 650 MG RE SUPP: 650.0000 mg | Freq: Four times a day (QID) | RECTAL | Status: DC | PRN

## 2022-02-04 NOTE — ED Triage Notes (Signed)
Pt reports was admitted to hospital on Monday for GI bleeding.  Reports received 2 units of blood Monday night and had an endoscopy Tuesday.  Reports says she was diagnosed with AVM.  Reports was discharged yesterday.  Says didn't feel well yesterday.  Says chest feelsheavy, sob, and feeling like heart is racing.   ?

## 2022-02-04 NOTE — Assessment & Plan Note (Addendum)
Unspecified type.  O2 sats down to 87% on room air, requiring 2 L.  Chest CT showing interstitial edema, congestive heart failure.  BNP not markedly elevated at 201.  Weight appears stable and at her baseline.  Significant signs of peripheral edema.  Hospitalization, she received 2 units PRBC and possibly some fluids. ?-IV Lasix 40 twice daily ?-Obtain echocardiogram ?-Strict input output, daily weights, daily BMP ?-Troponins x 2 ?

## 2022-02-04 NOTE — Telephone Encounter (Signed)
Thanks for the update, seems that her Hb in the ER is stable at 8.9, no changes in management from our side ?

## 2022-02-04 NOTE — ED Notes (Signed)
Ambulated pt, O2 stayed 94-97. Pt stated she was lightheaded, out of breathe, when pt laid back down she said that made her head hurt and felt more out of breathe. ?

## 2022-02-04 NOTE — ED Notes (Signed)
This RN attempted to call and give report to the floor. No answer at this time ? ?

## 2022-02-04 NOTE — Assessment & Plan Note (Signed)
Admission 5/1/- 5/3 for same.  Received 2 units PRBC.  Discharge hemoglobin 8.9.  EGD results 02/02/2022 by Dr. Jenetta Downer- Mallory-Weiss tear. Clip was placed. ?- Hematin (altered blood/coffee-ground-like material) in the entire stomach. ?- A single recently bleeding angiodysplastic lesion in the stomach. Treated with argon beam. ?-IV Protonix 40 twice daily ?

## 2022-02-04 NOTE — Assessment & Plan Note (Signed)
Stable

## 2022-02-04 NOTE — ED Provider Notes (Signed)
?Rule ?Provider Note ? ? ?CSN: [id number] ?Arrival date & time: 02/04/22  0944 ? ?  ? ?History ? ?Chief Complaint  ?Patient presents with  ? Chest Pain  ? ? ?Alexa Nichols is a 76 y.o. female. ? ?HPI ?She presents for evaluation of persistent shortness of breath with orthopnea, since hospital discharge yesterday.  She was admitted earlier in the week for hematemesis, was evaluated and treated and discharged.  She has a mild cough which is not productive.  She gets more out of breath with exertion such as walking in her home.  She has not seen any blood in stool.  She has been able to eat but is not as hungry as usual. ?  ? ?Home Medications ?Prior to Admission medications   ?Medication Sig Start Date End Date Taking? Authorizing Provider  ?acetaminophen (TYLENOL) 500 MG tablet Take 500 mg by mouth every 6 (six) hours as needed for moderate pain or headache.    [provider]  ?alendronate (FOSAMAX) 70 MG tablet Take 70 mg by mouth once a week. 11/30/21   [provider]  ?ALPRAZolam Duanne Moron) 0.5 MG tablet Take 0.25-0.5 mg by mouth daily as needed. 12/19/21   [provider]  ?ferrous sulfate 325 (65 FE) MG EC tablet Take 1 tablet (325 mg total) by mouth 2 (two) times daily with a meal. 02/03/22   Emokpae, Courage, MD  ?Fluticasone-Umeclidin-Vilant (TRELEGY ELLIPTA) 200-62.5-25 MCG/ACT AEPB Inhale 1 puff into the lungs daily. 02/03/22   Roxan Hockey, MD  ?HYDROcodone-acetaminophen (NORCO/VICODIN) 5-325 MG tablet Take 1 tablet by mouth 2 (two) times daily as needed. 10/21/21   [provider]  ?montelukast (SINGULAIR) 10 MG tablet Take 10 mg by mouth at bedtime.    [provider]  ?Multiple Vitamins-Minerals (PRESERVISION AREDS 2) CAPS Take 1 capsule by mouth in the morning and at bedtime.    [provider]  ?ondansetron (ZOFRAN) 4 MG tablet Take 1 tablet (4 mg total) by mouth every 6 (six) hours as needed for nausea. 02/03/22   Roxan Hockey, MD  ?pantoprazole (PROTONIX) 40 MG tablet Take 1 tablet (40 mg total) by mouth 2 (two) times daily. 02/03/22 02/03/23  Roxan Hockey, MD  ?sertraline (ZOLOFT) 25 MG tablet Take 25 mg by mouth daily. 11/22/21   [provider]  ?simvastatin (ZOCOR) 20 MG tablet Take 20 mg by mouth daily.    [provider]  ?vitamin B-12 (CYANOCOBALAMIN) 1000 MCG tablet Take 1 tablet (1,000 mcg total) by mouth daily. 02/03/22   Roxan Hockey, MD  ?   ? ?Allergies    ?Aspirin, Codeine, Sulfa antibiotics, and Other   ? ?Review of Systems   ?Review of Systems ? ?Physical Exam ?Updated Vital Signs ?BP 121/66   Pulse (!) 104   Temp 99.4 ?F (37.4 ?C) (Oral)   Resp (!) 26   Ht '4\' 10"'$  (1.473 m)   Wt 62.6 kg   SpO2 (!) 87%   BMI 28.84 kg/m?  ?Physical Exam ?Vitals and nursing note reviewed.  ?Constitutional:   ?   General: She is not in acute distress. ?   Appearance: She is well-developed. She is not ill-appearing, toxic-appearing or diaphoretic.  ?HENT:  ?   Head: Normocephalic and atraumatic.  ?   Right Ear: External ear normal.  ?   Left Ear: External ear normal.  ?Eyes:  ?   Conjunctiva/sclera: Conjunctivae normal.  ?   Pupils: Pupils are equal, round, and reactive to light.  ?  Neck:  ?   Trachea: Phonation normal.  ?Cardiovascular:  ?   Rate and Rhythm: Normal rate and regular rhythm.  ?   Heart sounds: Murmur heard.  ?Pulmonary:  ?   Effort: Pulmonary effort is normal. No respiratory distress.  ?   Breath sounds: No stridor.  ?   Comments: Rales, left base greater than right base.  Scattered rhonchi.  No wheezes.  No increased work of breathing. ?Abdominal:  ?   Palpations: Abdomen is soft.  ?   Tenderness: There is no abdominal tenderness.  ?Musculoskeletal:     ?   General: Normal range of motion.  ?   Cervical back: Normal range of motion and neck supple.  ?   Right lower leg: No edema.  ?   Left lower leg: No edema.  ?Skin: ?   General: Skin is warm and dry.  ?Neurological:  ?   Mental Status: She is  alert and oriented to person, place, and time.  ?   Cranial Nerves: No cranial nerve deficit.  ?   Sensory: No sensory deficit.  ?   Motor: No abnormal muscle tone.  ?   Coordination: Coordination normal.  ?Psychiatric:     ?   Mood and Affect: Mood normal.     ?   Behavior: Behavior normal.     ?   Thought Content: Thought content normal.     ?   Judgment: Judgment normal.  ? ? ?ED Results / Procedures / Treatments   ?Labs ?(all labs ordered are listed, but only abnormal results are displayed) ?Labs Reviewed  ?BASIC METABOLIC PANEL - Abnormal; Notable for the following components:  ?    Result Value  ? Potassium 2.9 (*)   ? Glucose, Bld 105 (*)   ? BUN 5 (*)   ? Creatinine, Ser 0.34 (*)   ? Calcium 8.4 (*)   ? All other components within normal limits  ?CBC WITH DIFFERENTIAL/PLATELET - Abnormal; Notable for the following components:  ? WBC 11.8 (*)   ? RBC 2.97 (*)   ? Hemoglobin 8.9 (*)   ? HCT 28.4 (*)   ? Neutro Abs 9.3 (*)   ? Monocytes Absolute 1.1 (*)   ? All other components within normal limits  ?BRAIN NATRIURETIC PEPTIDE - Abnormal; Notable for the following components:  ? B Natriuretic Peptide 201.0 (*)   ? All other components within normal limits  ?RESP PANEL BY RT-PCR (FLU A&B, COVID) [id number]  ?MAGNESIUM  ? ? ?EKG ?EKG Interpretation ? ?Date/Time:  Thursday Feb 04 2022 10:13:43 EDT ?Ventricular Rate:  95 ?PR Interval:  154 ?QRS Duration: 136 ?QT Interval:  416 ?QTC Calculation: 523 ?R Axis:   14 ?Text Interpretation: Sinus rhythm Left bundle branch block since last tracing no significant change Confirmed by Daleen Bo [phone]) on 02/04/2022 2:40:37 PM ? ?Radiology ?CT Angio Chest PE W/Cm &/Or Wo Cm ? ?Result Date: 02/04/2022 ?CLINICAL DATA:  Pulmonary emboli suspected. Shortness of breath with exertion over the last 2 days. EXAM: CT ANGIOGRAPHY CHEST WITH CONTRAST TECHNIQUE: Multidetector CT imaging of the chest was performed using the standard protocol during bolus administration of intravenous  contrast. Multiplanar CT image reconstructions and MIPs were obtained to evaluate the vascular anatomy. RADIATION DOSE REDUCTION: This exam was performed according to the departmental dose-optimization program which includes automated exposure control, adjustment of the mA and/or kV according to patient size and/or use of iterative reconstruction technique. CONTRAST:  50m OMNIPAQUE IOHEXOL 350 MG/ML SOLN  COMPARISON:  Chest radiography same day.  Prior CT 2008. FINDINGS: Cardiovascular: Heart size upper limits of normal, but with left atrial prominence. No pericardial effusion. No visible coronary artery calcification. Aortic atherosclerotic calcification is present. Pulmonary arterial opacification is good. There are no pulmonary emboli. Mediastinum/Nodes: No mediastinal or hilar mass or lymphadenopathy. Lungs/Pleura: There are bilateral pleural effusions layering dependently, larger on the right than the left, with dependent atelectasis. There is interstitial pulmonary edema. Upper Abdomen: Negative Musculoskeletal: Thoracic kyphosis with bridging osteophytes. Review of the MIP images confirms the above findings. IMPRESSION: No pulmonary emboli. Congestive heart failure with interstitial edema, bilateral effusions right larger than left, and dependent pulmonary atelectasis. Aortic Atherosclerosis (ICD10-I70.0). Electronically Signed   By: Nelson Chimes M.D.   On: 02/04/2022 15:34  ? ?DG Chest Portable 1 View ? ?Result Date: 02/04/2022 ?CLINICAL DATA:  Shortness of breath. EXAM: PORTABLE CHEST 1 VIEW COMPARISON:  November 04, 2020. FINDINGS: The heart size and mediastinal contours are within normal limits. Mild bibasilar subsegmental atelectasis or infiltrates are noted with possible small pleural effusions. The visualized skeletal structures are unremarkable. IMPRESSION: Mild bibasilar subsegmental atelectasis or infiltrates are noted with possible small pleural effusions. Electronically Signed   By: Marijo Conception  M.D.   On: 02/04/2022 10:37   ? ?Procedures ?Procedures  ? ? ?Medications Ordered in ED ?Medications  ?furosemide (LASIX) injection 40 mg (has no administration in time range)  ?potassium chloride SA (KLOR-CON M) CR ta

## 2022-02-04 NOTE — Assessment & Plan Note (Signed)
K- 2.9; Normal Mag- 2.2 ?- Replete ?

## 2022-02-04 NOTE — H&P (Addendum)
?History and Physical  ? ? ?Alexa Nichols RXV:[id number] DOB: 02/18/1946 DOA: 02/04/2022 ? ?PCP: [name], MD  ? ?Patient coming from: Home ? ?I have personally briefly reviewed patient's old medical records in [place] ? ?Chief Complaint: Difficulty breathing ? ?HPI: Alexa Nichols is a 76 y.o. female with medical history significant for GI bleed- AVMs, COPD, presented to the ED with complaints of difficulty breathing, and her heart racing. ?Was admitted to the hospital 5/1 to 5/3 for acute GI bleed, she required 2 units PRBC given 5/1, and her discharge hemoglobin was 8.9. ?She reports on discharge she did not feel quite back to her baseline but she felt she would take a while to get back to her baseline considering all the events of her hospitalization including getting an EGD. ?On getting home she felt her heart racing, was having difficulty taking a deep breath.  She checked her heart rates with her home meter and it was 115.  She denies chest pain.  She reports her baseline weight is 140 pounds, she denies lower extremity swelling.  She feels her abdomen is a bit bloated. ? ?ED Course: Heart rate 90-112.  Respiratory rate 18-30.  O2 sats initially stable later dropped to 87% on room air at rest.  Placed on 2 L sats greater than 97% ?Chest x-ray showed subsegmental atelectasis or infiltrates, possible small pleural effusion . ?Subsequent CTA chest was negative for PE, but suggestive of congestive heart failure with interstitial edema, right greater than left pleural effusions. ?40 mg x 1 given.  Hospitalist to admit ? ?Review of Systems: As per HPI all other systems reviewed and negative. ? ?Past Medical History:  ?Diagnosis Date  ? Asthma   ? Chronic low back pain   ? COPD (chronic obstructive pulmonary disease) ([place])   ? Depression   ? GERD (gastroesophageal reflux disease)   ? Guillain Barr? syndrome ([place])   ? Heart murmur   ? Lobular carcinoma of left breast ([place]) 1997  ? in situ  ? Other and  unspecified hyperlipidemia   ? Seasonal allergies   ? ? ?Past Surgical History:  ?Procedure Laterality Date  ? BIOPSY  12/03/2020  ? Procedure: BIOPSY;  Surgeon: Rogene Houston, MD;  [facility];  Service: Endoscopy;;  gastric polyps biopsies;  ? BREAST SURGERY Left   ? lumpectomy   ? [place]  ? CHOLECYSTECTOMY    ? COLONOSCOPY WITH PROPOFOL N/A 12/03/2020  ?  two 4-7 mm polyps in cecum, one small polyp in cecum, one 5 mm polyp, sigmoid diverticulosis, path with tubular adenomas  ? ESOPHAGOGASTRODUODENOSCOPY (EGD) WITH PROPOFOL N/A 12/03/2020  ? normal esophagus, multiple gastric polyps s/p biopsy, small paraesophageal hernia, normal duodenum. Fundic gland polyps.  ? ESOPHAGOGASTRODUODENOSCOPY (EGD) WITH PROPOFOL N/A 02/05/2021  ? Procedure: ESOPHAGOGASTRODUODENOSCOPY (EGD) WITH PROPOFOL;  Surgeon: [name], MD;  [facility];  Service: Endoscopy;  Laterality: N/A;  ? ESOPHAGOGASTRODUODENOSCOPY (EGD) WITH PROPOFOL N/A 03/03/2021  ? Procedure: ESOPHAGOGASTRODUODENOSCOPY (EGD) WITH PROPOFOL;  Surgeon: [name], MD;  [facility];  Service: Gastroenterology;  Laterality: N/A;  with possible push enteroscopy utilizing the pediatric colonoscope  ? GIVENS CAPSULE STUDY N/A 12/16/2020  ? normal small bowel capsule but rapid transit time of 21 minutes. Recommended to resume alendronate.   ? GIVENS CAPSULE STUDY N/A 02/04/2021  ? Procedure: GIVENS CAPSULE STUDY;  Surgeon: Rogene Houston, MD;  [facility];  Service: Endoscopy;  Laterality: N/A;  ? HOT HEMOSTASIS  02/05/2021  ? Procedure: HOT HEMOSTASIS (ARGON PLASMA COAGULATION/BICAP);  Surgeon: [name], MD;  [facility];  Service: Endoscopy;;  ? HOT HEMOSTASIS  03/03/2021  ? Procedure: HOT HEMOSTASIS (ARGON PLASMA COAGULATION/BICAP);  Surgeon: [name], [name], MD;  [facility];  Service: Gastroenterology;;  ? TONSILLECTOMY    ? age 63  ? ? ? reports that she  quit smoking about 38 years ago. Her smoking use included cigarettes. She started smoking about 57 years ago. She has a 10.00 pack-year smoking history. She has never used smokeless tobacco. She reports that she does not drink alcohol and does not use drugs. ? ?Allergies  ?Allergen Reactions  ? Aspirin   ?  samter's syndrome  ? Codeine Itching  ? Sulfa Antibiotics   ?  Unknown reaction  ? Other Rash  ?  gel filled fentanyl patch, adhesive on that patch caused ithcing  ? ? ?Family History  ?Problem Relation Age of Onset  ? Osteoporosis Mother   ? Other Father   ?     head trauma  ? Breast cancer Sister   ?     X's 2 sisters  ? ?Prior to Admission medications   ?Medication Sig Start Date End Date Taking? Authorizing Provider  ?acetaminophen (TYLENOL) 500 MG tablet Take 500 mg by mouth every 6 (six) hours as needed for moderate pain or headache.   Yes [provider]  ?alendronate (FOSAMAX) 70 MG tablet Take 70 mg by mouth once a week. 11/30/21  Yes [provider]  ?ALPRAZolam Duanne Moron) 0.5 MG tablet Take 0.25-0.5 mg by mouth daily as needed. 12/19/21  Yes [provider]  ?ferrous sulfate 325 (65 FE) MG EC tablet Take 1 tablet (325 mg total) by mouth 2 (two) times daily with a meal. 02/03/22  Yes Anastassia Noack, Courage, MD  ?HYDROcodone-acetaminophen (NORCO/VICODIN) 5-325 MG tablet Take 1 tablet by mouth 2 (two) times daily as needed. 10/21/21  Yes [provider]  ?montelukast (SINGULAIR) 10 MG tablet Take 10 mg by mouth at bedtime.   Yes [provider]  ?Multiple Vitamins-Minerals (PRESERVISION AREDS 2) CAPS Take 1 capsule by mouth every evening.   Yes [provider]  ?ondansetron (ZOFRAN) 4 MG tablet Take 1 tablet (4 mg total) by mouth every 6 (six) hours as needed for nausea. 02/03/22  Yes Tafari Humiston, Courage, MD  ?pantoprazole (PROTONIX) 40 MG tablet Take 1 tablet (40 mg total) by mouth 2 (two) times daily. 02/03/22 02/03/23 Yes Jovoni Borkenhagen, Courage, MD  ?sertraline (ZOLOFT) 25 MG  tablet Take 25 mg by mouth daily. 11/22/21  Yes [provider]  ?simvastatin (ZOCOR) 20 MG tablet Take 20 mg by mouth daily.   Yes [provider]  ?Fluticasone-Umeclidin-Vilant (TRELEGY ELLIPTA) 200-62.5-25 MCG/ACT AEPB Inhale 1 puff into the lungs daily. ?Patient not taking: Reported on 02/04/2022 02/03/22   Roxan Hockey, MD  ?vitamin B-12 (CYANOCOBALAMIN) 1000 MCG tablet Take 1 tablet (1,000 mcg total) by mouth daily. 02/03/22   Roxan Hockey, MD  ? ? ?Physical Exam: ?Vitals:  ? 02/04/22 1500 02/04/22 1530 02/04/22 1535 02/04/22 1654  ?BP: 124/65 121/66  132/70  ?Pulse: (!) 109 (!) 104  (!) 103  ?Resp: (!) 25 (!) 26  (!) 21  ?Temp:      ?TempSrc:      ?SpO2: 91% (!) 87% (!) 87% 97%  ?Weight:      ?Height:      ? ? ?Constitutional: NAD, calm,  comfortable ?Vitals:  ? 02/04/22 1500 02/04/22 1530 02/04/22 1535 02/04/22 1654  ?BP: 124/65 121/66  132/70  ?Pulse: (!) 109 (!) 104  (!) 103  ?Resp: (!) 25 (!) 26  (!) 21  ?Temp:      ?TempSrc:      ?SpO2: 91% (!) 87% (!) 87% 97%  ?Weight:      ?Height:      ? ?Eyes: PERRL, lids and conjunctivae normal ?ENMT: Mucous membranes are moist.  ?Neck: normal, supple, no masses, no thyromegaly ?Respiratory: clear to auscultation bilaterally, no wheezing, no crackles. Normal respiratory effort. No accessory muscle use.  ?Cardiovascular: Regular rate and rhythm, 3/6 known cardiac murmurs / rubs / gallops. No extremity edema.  ?Abdomen: no tenderness, no masses palpated. No hepatosplenomegaly. Bowel sounds positive.  ?Musculoskeletal: no clubbing / cyanosis. No joint deformity upper and lower extremities.  ?Skin: no rashes, lesions, ulcers. No induration ?Neurologic: No apparent cranial nerve abnormality moving extremities spontaneously ?Psychiatric: Normal judgment and insight. Alert and oriented x 3. Normal mood.  ? ?Labs on Admission: I have personally reviewed following labs and imaging studies ? ?CBC: ?Recent Labs  ?Lab 02/01/22 ?0748 02/01/22 ?8592  02/01/22 ?1135 02/02/22 ?9244 02/03/22 ?6286 02/04/22 ?1059  ?WBC 17.8*  --   --  10.8* 11.1* 11.8*  ?NEUTROABS 14.3*  --   --   --   --  9.3*  ?HGB 9.1* 9.5* 7.6* 8.4* 8.2* 8.9*  ?HCT 28.4* 28.0* 23.8* 27.6* 26.8*

## 2022-02-04 NOTE — Telephone Encounter (Signed)
Patient called today saying she was released from [facility] yesterday 02/03/2022, she had an EGD on 02/02/2022. She had an Hgb of 8.2 yesterday, and now having issues with breathing/ shortness of breath, heart racing and insomnia. I advised that she needed to go to the ED. She states she would do that now. ?

## 2022-02-04 NOTE — ED Notes (Signed)
Patient returned to room ans states that she cannot breathe. O2 sats 87% on RA. N/C placed at this time.  ?

## 2022-02-05 ENCOUNTER — Encounter (HOSPITAL_COMMUNITY): Payer: Self-pay | Admitting: Gastroenterology

## 2022-02-05 ENCOUNTER — Observation Stay (HOSPITAL_BASED_OUTPATIENT_CLINIC_OR_DEPARTMENT_OTHER): Payer: Medicare Other

## 2022-02-05 DIAGNOSIS — I509 Heart failure, unspecified: Secondary | ICD-10-CM | POA: Diagnosis not present

## 2022-02-05 DIAGNOSIS — I5030 Unspecified diastolic (congestive) heart failure: Secondary | ICD-10-CM | POA: Diagnosis not present

## 2022-02-05 DIAGNOSIS — I5033 Acute on chronic diastolic (congestive) heart failure: Secondary | ICD-10-CM | POA: Diagnosis not present

## 2022-02-05 LAB — ECHOCARDIOGRAM COMPLETE
AR max vel: 2.49 cm2
AV Area VTI: 2.47 cm2
AV Area mean vel: 2.53 cm2
AV Mean grad: 11 mmHg
AV Peak grad: 20.6 mmHg
Ao pk vel: 2.27 m/s
Area-P 1/2: 3.87 cm2
Calc EF: 73.1 %
Height: 58 in
MV M vel: 5.66 m/s
MV Peak grad: 128.1 mmHg
MV VTI: 3.27 cm2
S' Lateral: 3.1 cm
Single Plane A2C EF: 67.7 %
Single Plane A4C EF: 75.3 %
Weight: 2230.4 oz

## 2022-02-05 LAB — BASIC METABOLIC PANEL
Anion gap: 9 (ref 5–15)
BUN: 8 mg/dL (ref 8–23)
CO2: 26 mmol/L (ref 22–32)
Calcium: 8.3 mg/dL — ABNORMAL LOW (ref 8.9–10.3)
Chloride: 105 mmol/L (ref 98–111)
Creatinine, Ser: 0.32 mg/dL — ABNORMAL LOW (ref 0.44–1.00)
GFR, Estimated: >60 mL/min (ref >60)
Glucose, Bld: 104 mg/dL — ABNORMAL HIGH (ref 70–99)
Potassium: 4.1 mmol/L (ref 3.5–5.1)
Sodium: 140 mmol/L (ref 135–145)

## 2022-02-05 MED ORDER — FUROSEMIDE 20 MG PO TABS: 20.0000 mg | ORAL_TABLET | Freq: Every day | ORAL | 11 refills | Status: DC

## 2022-02-05 MED ORDER — FLUTICASONE-UMECLIDIN-VILANT 200-62.5-25 MCG/ACT IN AEPB: 1.0000 | INHALATION_SPRAY | Freq: Every day | RESPIRATORY_TRACT | 3 refills | Status: AC

## 2022-02-05 NOTE — Progress Notes (Signed)
*  PRELIMINARY RESULTS* ?Echocardiogram ?2D Echocardiogram has been performed. ? ?Alexa Nichols ?02/05/2022, 12:20 PM ?

## 2022-02-05 NOTE — TOC Progression Note (Signed)
?  Transition of Care (TOC) Screening Note ? ? ?Patient Details  ?Name: Alexa Nichols ?Date of Birth: [birth year] ? ? ?Transition of Care (TOC) CM/SW Contact:    ?Boneta Lucks, RN ?Phone Number: ?02/05/2022, 11:48 AM ? ? ? ?Transition of Care Department [facility]) has reviewed patient and no TOC needs have been identified at this time. We will continue to monitor patient advancement through interdisciplinary progression rounds. If new patient transition needs arise, please place a TOC consult. ? ? ? ? ?  ?Barriers to Discharge: No Barriers Identified ?  ?  ?  ?  ? ?

## 2022-02-05 NOTE — Discharge Summary (Signed)
Alexa Nichols, is a 76 y.o. female  DOB 04/30/1946  MRN [id number].  Admission date:  02/04/2022  Admitting Physician  Bethena Roys, MD  Discharge Date:  02/05/2022   Primary MD  [name], MD  Recommendations for primary care physician for things to follow:  1)Very low-salt diet advised 2)Weigh yourself daily, call if you gain more than 3 pounds in 1 day or more than 5 pounds in 1 week as your diuretic medications may need to be adjusted 3)Avoid ibuprofen/Advil/Aleve/Motrin/Goody Powders/Naproxen/BC powders/Meloxicam/Diclofenac/Indomethacin and other Nonsteroidal anti-inflammatory medications as these will make you more likely to bleed and can cause stomach ulcers, can also cause Kidney problems. 4)Repeat CBC and BMP Blood test with Jerene Bears B, MD (PCP) within 3 to 4 days 5)Follow up with Dr. Jenetta Downer or Dr Laural Golden-- in 2  weeks for recheck -- address [address], [address], [place] [address],,Phone Number [phone]  Admission Diagnosis  Acute respiratory failure with hypoxia (HCC) [J96.01] Anemia, unspecified type [D64.9] Congestive heart failure, unspecified HF chronicity, unspecified heart failure type ([place]) [I50.9]   Discharge Diagnosis  Acute respiratory failure with hypoxia (HCC) [J96.01] Anemia, unspecified type [D64.9] Congestive heart failure, unspecified HF chronicity, unspecified heart failure type ([place]) [I50.9]    Principal Problem:   Acute congestive heart failure ([place]) Active Problems:   COPD (chronic obstructive pulmonary disease) (HCC)   Acute blood loss anemia   Acute respiratory failure with hypoxia (HCC)   Hypokalemia      Past Medical History:  Diagnosis Date   Asthma    Chronic low back pain    COPD (chronic obstructive pulmonary disease) (HCC)    Depression    GERD (gastroesophageal reflux disease)    Guillain Barr syndrome (HCC)    Heart  murmur    Lobular carcinoma of left breast ([place]) 1997   in situ   Other and unspecified hyperlipidemia    Seasonal allergies     Past Surgical History:  Procedure Laterality Date   BIOPSY  12/03/2020   Procedure: BIOPSY;  Surgeon: Rogene Houston, MD;  [facility];  Service: Endoscopy;;  gastric polyps biopsies;   BREAST SURGERY Left    lumpectomy    CESAREAN SECTION  1986   CHOLECYSTECTOMY     COLONOSCOPY WITH PROPOFOL N/A 12/03/2020    two 4-7 mm polyps in cecum, one small polyp in cecum, one 5 mm polyp, sigmoid diverticulosis, path with tubular adenomas   ENTEROSCOPY N/A 02/02/2022   Procedure: ENTEROSCOPY;  Surgeon: [name], MD;  [facility];  Service: Gastroenterology;  Laterality: N/A;   ESOPHAGOGASTRODUODENOSCOPY (EGD) WITH PROPOFOL N/A 12/03/2020   normal esophagus, multiple gastric polyps s/p biopsy, small paraesophageal hernia, normal duodenum. Fundic gland polyps.   ESOPHAGOGASTRODUODENOSCOPY (EGD) WITH PROPOFOL N/A 02/05/2021   Procedure: ESOPHAGOGASTRODUODENOSCOPY (EGD) WITH PROPOFOL;  Surgeon: [name], MD;  [facility];  Service: Endoscopy;  Laterality: N/A;   ESOPHAGOGASTRODUODENOSCOPY (EGD) WITH PROPOFOL N/A 03/03/2021   Procedure: ESOPHAGOGASTRODUODENOSCOPY (EGD) WITH  PROPOFOL;  Surgeon: [name], [name], MD;  [facility];  Service: Gastroenterology;  Laterality: N/A;  with possible push enteroscopy utilizing the pediatric colonoscope   ESOPHAGOGASTRODUODENOSCOPY (EGD) WITH PROPOFOL N/A 02/02/2022   Procedure: ESOPHAGOGASTRODUODENOSCOPY (EGD) WITH PROPOFOL;  Surgeon: [name], MD;  [facility];  Service: Gastroenterology;  Laterality: N/A;   GIVENS CAPSULE STUDY N/A 12/16/2020   normal small bowel capsule but rapid transit time of 21 minutes. Recommended to resume alendronate.    GIVENS CAPSULE STUDY N/A 02/04/2021   Procedure: GIVENS CAPSULE STUDY;  Surgeon: Rogene Houston, MD;  [facility];  Service: Endoscopy;  Laterality: N/A;   HOT HEMOSTASIS  02/05/2021   Procedure: HOT HEMOSTASIS (ARGON PLASMA COAGULATION/BICAP);  Surgeon: [name], MD;  [facility];  Service: Endoscopy;;   HOT HEMOSTASIS  03/03/2021   Procedure: HOT HEMOSTASIS (ARGON PLASMA COAGULATION/BICAP);  Surgeon: [name], [name], MD;  [facility];  Service: Gastroenterology;;   HOT HEMOSTASIS  02/02/2022   Procedure: HOT HEMOSTASIS (ARGON PLASMA COAGULATION/BICAP);  Surgeon: [name], [name], MD;  [facility];  Service: Gastroenterology;;   TONSILLECTOMY     age 52     HPI  from the history and physical done on the day of admission:     Chief Complaint: Difficulty breathing   HPI: [name] is a 76 y.o. female with medical history significant for GI bleed- AVMs, COPD, presented to the ED with complaints of difficulty breathing, and her heart racing. Was admitted to the hospital 5/1 to 5/3 for acute GI bleed, she required 2 units PRBC given 5/1, and her discharge hemoglobin was 8.9. She reports on discharge she did not feel quite back to her baseline but she felt she would take a while to get back to her baseline considering all the events of her hospitalization including getting an EGD. On getting home she felt her heart racing, was having difficulty taking a deep breath.  She checked her heart rates with her home meter and it was 115.  She denies chest pain.  She reports her baseline weight is 140 pounds, she denies lower extremity swelling.  She feels her abdomen is a bit bloated.   ED Course: Heart rate 90-112.  Respiratory rate 18-30.  O2 sats initially stable later dropped to 87% on room air at rest.  Placed on 2 L sats greater than 97% Chest x-ray showed subsegmental atelectasis or infiltrates, possible small pleural effusion . Subsequent CTA chest was negative for PE, but suggestive of congestive heart failure with  interstitial edema, right greater than left pleural effusions. 40 mg x 1 given.  Hospitalist to admit   Review of Systems: As per HPI all other systems reviewed and negative.     Hospital Course:    Assessment and Plan:  Acute on chronic diastolic congestive heart failure exacerbation in the setting of PRBC transfusion/volume overload - O2 sats down to 87% on room air, required supplemental oxygen.  -On admission chest CT showing interstitial edema, congestive heart failure.  - BNP not markedly elevated at 201.  - Weight appears stable and at her baseline.   Recently received 2 units of PRBC--- which led to volume overload status -Echo with EF of 65 to 70% with grade 2 diastolic dysfunction -Responded well to IV diuresis -Overall 3200 mL of urine in the last 24 hours -Fluid balance is negative -Hypoxia has resolved -Discharge home on Lasix 20 mg daily  Hypokalemia Replaced potassium Magnesium WNL  Acute blood loss anemia due to GI bleed -Patiently recently transfused 2 units of PRBC,  -EGD from 02/02/2022 had shown Mallory-Weiss tear clips were placed -Continue IV protonix  COPD (chronic obstructive pulmonary disease) (HCC) Stable. -No acute exacerbation  Discharge Condition: Stable, no hypoxia  Follow UP--outpatient follow-up with PCP and cardiologist  Diet and Activity recommendation:  As advised  Discharge Instructions    Discharge Instructions     Call MD for:  difficulty breathing, headache or visual disturbances   Complete by: As directed    Call MD for:  persistant dizziness or light-headedness   Complete by: As directed    Call MD for:  persistant nausea and vomiting   Complete by: As directed    Call MD for:  temperature >100.4   Complete by: As directed    Diet - low sodium heart healthy   Complete by: As directed    Discharge instructions   Complete by: As directed    1)Very low-salt diet advised 2)Weigh yourself daily, call if you gain more than 3  pounds in 1 day or more than 5 pounds in 1 week as your diuretic medications may need to be adjusted 3)Avoid ibuprofen/Advil/Aleve/Motrin/Goody Powders/Naproxen/BC powders/Meloxicam/Diclofenac/Indomethacin and other Nonsteroidal anti-inflammatory medications as these will make you more likely to bleed and can cause stomach ulcers, can also cause Kidney problems. 4)Repeat CBC and BMP Blood test with Jerene Bears B, MD (PCP) within 3 to 4 days 5)Follow up with Dr. Jenetta Downer or Dr Laural Golden-- in 2  weeks for recheck -- address [address], [place] [address],,TFTDD Number [phone]   Increase activity slowly   Complete by: As directed          Discharge Medications     Allergies as of 02/05/2022       Reactions   Aspirin    samter's syndrome   Codeine Itching   Sulfa Antibiotics    Unknown reaction   Other Rash   gel filled fentanyl patch, adhesive on that patch caused ithcing        Medication List     TAKE these medications    acetaminophen 500 MG tablet Commonly known as: TYLENOL Take 500 mg by mouth every 6 (six) hours as needed for moderate pain or headache.   alendronate 70 MG tablet Commonly known as: FOSAMAX Take 70 mg by mouth once a week.   ALPRAZolam 0.5 MG tablet Commonly known as: XANAX Take 0.25-0.5 mg by mouth daily as needed.   ferrous sulfate 325 (65 FE) MG EC tablet Take 1 tablet (325 mg total) by mouth 2 (two) times daily with a meal.   Fluticasone-Umeclidin-Vilant 200-62.5-25 MCG/ACT Aepb Commonly known as: Trelegy Ellipta Inhale 1 puff into the lungs daily.   furosemide 20 MG tablet Commonly known as: Lasix Take 1 tablet (20 mg total) by mouth daily.   HYDROcodone-acetaminophen 5-325 MG tablet Commonly known as: NORCO/VICODIN Take 1 tablet by mouth 2 (two) times daily as needed.   montelukast 10 MG tablet Commonly known as: SINGULAIR Take 10 mg by mouth at bedtime.   ondansetron 4 MG tablet Commonly known as:  ZOFRAN Take 1 tablet (4 mg total) by mouth every 6 (six) hours as needed for nausea.   pantoprazole 40 MG tablet Commonly known as: Protonix Take 1 tablet (40 mg total) by mouth 2 (two) times daily.   PreserVision AREDS 2 Caps Take 1 capsule by mouth every evening.   sertraline  25 MG tablet Commonly known as: ZOLOFT Take 25 mg by mouth daily.   simvastatin 20 MG tablet Commonly known as: ZOCOR Take 20 mg by mouth daily.   vitamin B-12 1000 MCG tablet Commonly known as: CYANOCOBALAMIN Take 1 tablet (1,000 mcg total) by mouth daily.        Major procedures and Radiology Reports - PLEASE review detailed and final reports for all details, in brief -   CT Angio Chest PE W/Cm &/Or Wo Cm  Result Date: 02/04/2022 CLINICAL DATA:  Pulmonary emboli suspected. Shortness of breath with exertion over the last 2 days. EXAM: CT ANGIOGRAPHY CHEST WITH CONTRAST TECHNIQUE: Multidetector CT imaging of the chest was performed using the standard protocol during bolus administration of intravenous contrast. Multiplanar CT image reconstructions and MIPs were obtained to evaluate the vascular anatomy. RADIATION DOSE REDUCTION: This exam was performed according to the departmental dose-optimization program which includes automated exposure control, adjustment of the mA and/or kV according to patient size and/or use of iterative reconstruction technique. CONTRAST:  102m OMNIPAQUE IOHEXOL 350 MG/ML SOLN COMPARISON:  Chest radiography same day.  Prior CT 2008. FINDINGS: Cardiovascular: Heart size upper limits of normal, but with left atrial prominence. No pericardial effusion. No visible coronary artery calcification. Aortic atherosclerotic calcification is present. Pulmonary arterial opacification is good. There are no pulmonary emboli. Mediastinum/Nodes: No mediastinal or hilar mass or lymphadenopathy. Lungs/Pleura: There are bilateral pleural effusions layering dependently, larger on the right than the left, with  dependent atelectasis. There is interstitial pulmonary edema. Upper Abdomen: Negative Musculoskeletal: Thoracic kyphosis with bridging osteophytes. Review of the MIP images confirms the above findings. IMPRESSION: No pulmonary emboli. Congestive heart failure with interstitial edema, bilateral effusions right larger than left, and dependent pulmonary atelectasis. Aortic Atherosclerosis (ICD10-I70.0). Electronically Signed   By: MNelson ChimesM.D.   On: 02/04/2022 15:34   DG Chest Portable 1 View  Result Date: 02/04/2022 CLINICAL DATA:  Shortness of breath. EXAM: PORTABLE CHEST 1 VIEW COMPARISON:  November 04, 2020. FINDINGS: The heart size and mediastinal contours are within normal limits. Mild bibasilar subsegmental atelectasis or infiltrates are noted with possible small pleural effusions. The visualized skeletal structures are unremarkable. IMPRESSION: Mild bibasilar subsegmental atelectasis or infiltrates are noted with possible small pleural effusions. Electronically Signed   By: JMarijo ConceptionM.D.   On: 02/04/2022 10:37   Intravitreal Injection, Pharmacologic Agent - OD - Right Eye  Result Date: 01/11/2022 Time Out 01/11/2022. 2:00 PM. Confirmed correct patient, procedure, site, and patient consented. Anesthesia Topical anesthesia was used. Anesthetic medications included Lidocaine 4%. Procedure Preparation included Tobramycin 0.3%, 10% betadine to eyelids, 5% betadine to ocular surface. A 30 gauge needle was used. Injection: 2 mg aflibercept 2 MG/0.05ML   Route: Intravitreal, Site: Right Eye   NDC: [id number] Lot: [id number] Waste: 0 mL Post-op Post injection exam found visual acuity of at least counting fingers. The patient tolerated the procedure well. There were no complications. The patient received written and verbal post procedure care education. Post injection medications included ocuflox.   OCT, Retina - OU - Both Eyes  Result Date: 01/11/2022 Right Eye Quality was good. Scan locations  included subfoveal. Central Foveal Thickness: 418. Progression has improved. Findings include abnormal foveal contour, outer retinal tubulation, subretinal hyper-reflective material, disciform scar, choroidal neovascular membrane, no IRF. Left Eye Quality was good. Scan locations included subfoveal. Central Foveal Thickness: 309. Progression has been stable. Findings include retinal drusen , normal foveal contour, no IRF, no SRF. Notes Recurrent CNVM  yet now overall improving macular findings and anatomy we will need to repeat injection today as macular anatomy has stabilized slightly worse at 7-week follow-up.  We will repeat injection of Eylea today OD and change again to 6-week follow-up   Micro Results   Recent Results (from the past 240 hour(s))  Resp Panel by RT-PCR (Flu A&B, Covid) Nasopharyngeal Swab     Status: None   Collection Time: 02/04/22 10:22 AM   Specimen: Nasopharyngeal Swab; Nasopharyngeal(NP) swabs in vial transport medium  Result Value Ref Range Status   SARS Coronavirus 2 by RT PCR NEGATIVE NEGATIVE Final    Comment: (NOTE) SARS-CoV-2 target nucleic acids are NOT DETECTED.  The SARS-CoV-2 RNA is generally detectable in upper respiratory specimens during the acute phase of infection. The lowest concentration of SARS-CoV-2 viral copies this assay can detect is 138 copies/mL. A negative result does not preclude SARS-Cov-2 infection and should not be used as the sole basis for treatment or other patient management decisions. A negative result may occur with  improper specimen collection/handling, submission of specimen other than nasopharyngeal swab, presence of viral mutation(s) within the areas targeted by this assay, and inadequate number of viral copies(<138 copies/mL). A negative result must be combined with clinical observations, patient history, and epidemiological information. The expected result is Negative.  Fact Sheet for Patients:   EntrepreneurPulse.com.au  Fact Sheet for Healthcare Providers:  IncredibleEmployment.be  This test is no t yet approved or cleared by the [place] FDA and  has been authorized for detection and/or diagnosis of SARS-CoV-2 by FDA under an Emergency Use Authorization (EUA). This EUA will remain  in effect (meaning this test can be used) for the duration of the COVID-19 declaration under Section 564(b)(1) of the Act, 21 U.S.C.section 360bbb-3(b)(1), unless the authorization is terminated  or revoked sooner.       Influenza A by PCR NEGATIVE NEGATIVE Final   Influenza B by PCR NEGATIVE NEGATIVE Final    Comment: (NOTE) The Xpert Xpress SARS-CoV-2/FLU/RSV plus assay is intended as an aid in the diagnosis of influenza from Nasopharyngeal swab specimens and should not be used as a sole basis for treatment. Nasal washings and aspirates are unacceptable for Xpert Xpress SARS-CoV-2/FLU/RSV testing.  Fact Sheet for Patients: EntrepreneurPulse.com.au  Fact Sheet for Healthcare Providers: IncredibleEmployment.be  This test is not yet approved or cleared by the [place] FDA and has been authorized for detection and/or diagnosis of SARS-CoV-2 by FDA under an Emergency Use Authorization (EUA). This EUA will remain in effect (meaning this test can be used) for the duration of the COVID-19 declaration under Section 564(b)(1) of the Act, 21 U.S.C. section 360bbb-3(b)(1), unless the authorization is terminated or revoked.  Performed at [facility], [address], [place], [place] [address]     Today   Subjective    Sidnee Gambrill today has no new complaints -Ambulating without dyspnea or hypoxia -No chest pains no palpitations no dizziness no dyspnea          Patient has been seen and examined prior to discharge   Objective   Blood pressure 108/81, pulse 81, temperature 98.6 F (37 C), temperature  source Oral, resp. rate 15, height '4\' 10"'$  (1.473 m), weight 63.2 kg, SpO2 97 %.   Intake/Output Summary (Last 24 hours) at 02/05/2022 1226 Last data filed at 02/05/2022 1000 Gross per 24 hour  Intake 480 ml  Output 3250 ml  Net -2770 ml    Exam Gen:- Awake Alert, no acute distress ,  no conversational dyspnea HEENT:- [place].AT, No sclera icterus Neck-Supple Neck,No JVD,.  Lungs-  CTAB , good air movement bilaterally CV- S1, S2 normal, regular Abd-  +ve B.Sounds, Abd Soft, No tenderness,    Extremity/Skin:- No  edema,   good pulses Psych-affect is appropriate, oriented x3 Neuro-no new focal deficits, no tremors    Data Review   CBC w Diff:  Lab Results  Component Value Date   WBC 11.8 (H) 02/04/2022   HGB 8.9 (L) 02/04/2022   HGB 7.0 (LL) 02/02/2021   HCT 28.4 (L) 02/04/2022   HCT 23.7 (L) 02/02/2021   PLT 170 02/04/2022   PLT 382 01/26/2021   LYMPHOPCT 10 02/04/2022   MONOPCT 9 02/04/2022   EOSPCT 0 02/04/2022   BASOPCT 0 02/04/2022    CMP:  Lab Results  Component Value Date   NA 140 02/05/2022   K 4.1 02/05/2022   CL 105 02/05/2022   CO2 26 02/05/2022   BUN 8 02/05/2022   CREATININE 0.32 (L) 02/05/2022   PROT 5.2 (L) 02/02/2022   ALBUMIN 3.0 (L) 02/02/2022   BILITOT 0.2 (L) 02/02/2022   ALKPHOS 50 02/02/2022   AST 17 02/02/2022   ALT 14 02/02/2022  .  Total Discharge time is about 33 minutes  Roxan Hockey M.D on 02/05/2022 at 12:26 PM  Go to [email or web address] -  for contact info  [facility] - Office  [phone]

## 2022-02-05 NOTE — Progress Notes (Signed)
Nsg Discharge Note ? ?Admit Date:  02/04/2022 ?Discharge date: 02/05/2022 ?  ?Rush Barer to be D/C'd Home per MD order.  AVS completed.   ?Patient/caregiver able to verbalize understanding. ? ?Discharge Medication: ?Allergies as of 02/05/2022   ? ?   Reactions  ? Aspirin   ? samter's syndrome  ? Codeine Itching  ? Sulfa Antibiotics   ? Unknown reaction  ? Other Rash  ? gel filled fentanyl patch, adhesive on that patch caused ithcing  ? ?  ? ?  ?Medication List  ?  ? ?TAKE these medications   ? ?acetaminophen 500 MG tablet ?Commonly known as: TYLENOL ?Take 500 mg by mouth every 6 (six) hours as needed for moderate pain or headache. ?  ?alendronate 70 MG tablet ?Commonly known as: FOSAMAX ?Take 70 mg by mouth once a week. ?  ?ALPRAZolam 0.5 MG tablet ?Commonly known as: Duanne Moron ?Take 0.25-0.5 mg by mouth daily as needed. ?  ?ferrous sulfate 325 (65 FE) MG EC tablet ?Take 1 tablet (325 mg total) by mouth 2 (two) times daily with a meal. ?  ?Fluticasone-Umeclidin-Vilant 200-62.5-25 MCG/ACT Aepb ?Commonly known as: Trelegy Ellipta ?Inhale 1 puff into the lungs daily. ?  ?furosemide 20 MG tablet ?Commonly known as: Lasix ?Take 1 tablet (20 mg total) by mouth daily. ?  ?HYDROcodone-acetaminophen 5-325 MG tablet ?Commonly known as: NORCO/VICODIN ?Take 1 tablet by mouth 2 (two) times daily as needed. ?  ?montelukast 10 MG tablet ?Commonly known as: SINGULAIR ?Take 10 mg by mouth at bedtime. ?  ?ondansetron 4 MG tablet ?Commonly known as: ZOFRAN ?Take 1 tablet (4 mg total) by mouth every 6 (six) hours as needed for nausea. ?  ?pantoprazole 40 MG tablet ?Commonly known as: Protonix ?Take 1 tablet (40 mg total) by mouth 2 (two) times daily. ?  ?PreserVision AREDS 2 Caps ?Take 1 capsule by mouth every evening. ?  ?sertraline 25 MG tablet ?Commonly known as: ZOLOFT ?Take 25 mg by mouth daily. ?  ?simvastatin 20 MG tablet ?Commonly known as: ZOCOR ?Take 20 mg by mouth daily. ?  ?vitamin B-12 1000 MCG tablet ?Commonly known as:  CYANOCOBALAMIN ?Take 1 tablet (1,000 mcg total) by mouth daily. ?  ? ?  ? ? ?Discharge Assessment: ?Vitals:  ? 02/05/22 0031 02/05/22 0522  ?BP: 113/61 108/81  ?Pulse: 88 81  ?Resp: 16 15  ?Temp: 99.3 ?F (37.4 ?C) 98.6 ?F (37 ?C)  ?SpO2: 97% 97%  ? Skin clean, dry and intact without evidence of skin break down, no evidence of skin tears noted. ?IV catheter discontinued intact. Site without signs and symptoms of complications - no redness or edema noted at insertion site, patient denies c/o pain - only slight tenderness at site.  Dressing with slight pressure applied. ? ?D/c Instructions-Education: ?Discharge instructions given to patient/family with verbalized understanding. ?D/c education completed with patient/family including follow up instructions, medication list, d/c activities limitations if indicated, with other d/c instructions as indicated by MD - patient able to verbalize understanding, all questions fully answered. ?Patient instructed to return to ED, call 911, or call MD for any changes in condition.  ?Patient escorted via [place], and D/C home via private auto. ? ?Kathie Rhodes, RN ?02/05/2022 12:05 PM  ?

## 2022-02-05 NOTE — Discharge Instructions (Addendum)
1)Very low-salt diet advised ?2)Weigh yourself daily, call if you gain more than 3 pounds in 1 day or more than 5 pounds in 1 week as your diuretic medications may need to be adjusted ?3)Avoid ibuprofen/Advil/Aleve/Motrin/Goody Powders/Naproxen/BC powders/Meloxicam/Diclofenac/Indomethacin and other Nonsteroidal anti-inflammatory medications as these will make you more likely to bleed and can cause stomach ulcers, can also cause Kidney problems. ?4)Repeat CBC and BMP Blood test with Jerene Bears B, MD (PCP) within 3 to 4 days ?5)Follow up with Dr. Jenetta Downer or Dr Laural Golden-- in 2  weeks for recheck ?-- address [address], [address], [place] [address],,LJQGB Number [phone] ?

## 2022-02-09 DIAGNOSIS — Z299 Encounter for prophylactic measures, unspecified: Secondary | ICD-10-CM | POA: Diagnosis not present

## 2022-02-09 DIAGNOSIS — Z09 Encounter for follow-up examination after completed treatment for conditions other than malignant neoplasm: Secondary | ICD-10-CM | POA: Diagnosis not present

## 2022-02-09 DIAGNOSIS — I509 Heart failure, unspecified: Secondary | ICD-10-CM | POA: Diagnosis not present

## 2022-02-09 DIAGNOSIS — I10 Essential (primary) hypertension: Secondary | ICD-10-CM | POA: Diagnosis not present

## 2022-02-09 DIAGNOSIS — D649 Anemia, unspecified: Secondary | ICD-10-CM | POA: Diagnosis not present

## 2022-02-17 ENCOUNTER — Telehealth: Payer: Self-pay | Admitting: Cardiology

## 2022-02-17 NOTE — Telephone Encounter (Signed)
Denies active symptoms-says she called to schedule referred appointment after recent hospital visit.  ?Advised she would be contacted by our schedulers ?

## 2022-02-17 NOTE — Telephone Encounter (Signed)
Pt c/o Shortness Of Breath: STAT if SOB developed within the last 24 hours or pt is noticeably SOB on the phone ? ?1. Are you currently SOB (can you hear that pt is SOB on the phone)? Yes  ? ?2. How long have you been experiencing SOB? A few weeks ? ?3. Are you SOB when sitting or when up moving around? Moving around ? ?4. Are you currently experiencing any other symptoms? Very weak and lathargic  ?

## 2022-02-19 ENCOUNTER — Encounter: Payer: Self-pay | Admitting: *Deleted

## 2022-02-19 ENCOUNTER — Ambulatory Visit (INDEPENDENT_AMBULATORY_CARE_PROVIDER_SITE_OTHER): Payer: Medicare Other | Admitting: Cardiology

## 2022-02-19 ENCOUNTER — Encounter: Payer: Self-pay | Admitting: Cardiology

## 2022-02-19 ENCOUNTER — Other Ambulatory Visit: Payer: Self-pay | Admitting: Cardiology

## 2022-02-19 ENCOUNTER — Telehealth: Payer: Self-pay | Admitting: Cardiology

## 2022-02-19 ENCOUNTER — Ambulatory Visit (INDEPENDENT_AMBULATORY_CARE_PROVIDER_SITE_OTHER): Payer: Medicare Other

## 2022-02-19 VITALS — BP 134/70 | HR 72 | Ht <= 58 in | Wt 137.6 lb

## 2022-02-19 DIAGNOSIS — R002 Palpitations: Secondary | ICD-10-CM

## 2022-02-19 DIAGNOSIS — I5032 Chronic diastolic (congestive) heart failure: Secondary | ICD-10-CM | POA: Diagnosis not present

## 2022-02-19 DIAGNOSIS — I517 Cardiomegaly: Secondary | ICD-10-CM

## 2022-02-19 MED ORDER — FUROSEMIDE 20 MG PO TABS
20.0000 mg | ORAL_TABLET | ORAL | Status: DC | PRN
Start: 2022-02-19 — End: 2024-07-02

## 2022-02-19 NOTE — Patient Instructions (Signed)
Medication Instructions:  Change your Lasix to '20mg'$  as needed for shortness of breath or swelling Continue all other medications.     Labwork: none  Testing/Procedures: Your physician has recommended that you wear a 7 day event monitor. Event monitors are medical devices that record the heart's electrical activity. Doctors most often [place] these monitors to diagnose arrhythmias. Arrhythmias are problems with the speed or rhythm of the heartbeat. The monitor is a small, portable device. You can wear one while you do your normal daily activities. This is usually used to diagnose what is causing palpitations/syncope (passing out). Office will contact with results via phone or letter.     Follow-Up: 3 months   Any Other Special Instructions Will Be Listed Below (If Applicable).   If you need a refill on your cardiac medications before your next appointment, please call your pharmacy.

## 2022-02-19 NOTE — Progress Notes (Signed)
Clinical Summary Ms. Gewirtz is a 76 y.o.female seen as new patient, last seen 03/21/2017 in our clinic  1. Chronic diastolic HF - admit 03/108 few days after discharge for GI bleed with SOB, volume overload - thought secondary to pRBCs, IVFs from prior admission - 02/2022 echo LVEF 65-70%, grade II dd, normal RV  - home weights daily, usually 136-139 lbs - mild orthostatic symptoms    2. LVOT dynamic gradient -turbulent flow in LVOT noted on echo with SAM, symmetrical wall thicknessat 1.3 cm by my measure. I believe the turbulent LVOT flow is due to symmetric moderate hypertrophy with hyperdynamic LV. Would not pursue further imaging at this time    3. Palpitations - occurs daily, occurs at rest. Feeling of heart fluttering, lasts a few seconds - drinks 1 cup of coffee. Decaf tea only, no sodas. No EtOH, no energy drinsk - ongoing for a we months.    4. Chronic LBBB   5. Asthma     6.History of GI bleed - admit 02/2022 with GI bleed due to [employer] tear - EGD on 02/02/2022 showed Mallory-Weiss tear, clip placed, Hematin in entire stomach, single, recently bleeding angiodysplastic lesion in stomach, s/p argon beam coagulation - received 2 units pRBCs - noted AVMs stomach, small bowel   7. Chronic SOB   Past Medical History:  Diagnosis Date   Asthma    Chronic low back pain    COPD (chronic obstructive pulmonary disease) (HCC)    Depression    GERD (gastroesophageal reflux disease)    Guillain Barr syndrome (HCC)    Heart murmur    Lobular carcinoma of left breast ([place]) 1997   in situ   Other and unspecified hyperlipidemia    Seasonal allergies      Allergies  Allergen Reactions   Aspirin     samter's syndrome   Codeine Itching   Sulfa Antibiotics     Unknown reaction   Other Rash    gel filled fentanyl patch, adhesive on that patch caused ithcing     Current Outpatient Medications  Medication Sig Dispense Refill   acetaminophen (TYLENOL)  500 MG tablet Take 500 mg by mouth every 6 (six) hours as needed for moderate pain or headache.     alendronate (FOSAMAX) 70 MG tablet Take 70 mg by mouth once a week.     ALPRAZolam (XANAX) 0.5 MG tablet Take 0.25-0.5 mg by mouth daily as needed.     ferrous sulfate 325 (65 FE) MG EC tablet Take 1 tablet (325 mg total) by mouth 2 (two) times daily with a meal. 90 tablet 3   Fluticasone-Umeclidin-Vilant (TRELEGY ELLIPTA) 200-62.5-25 MCG/ACT AEPB Inhale 1 puff into the lungs daily. 28 each 3   furosemide (LASIX) 20 MG tablet Take 1 tablet (20 mg total) by mouth daily. 30 tablet 11   HYDROcodone-acetaminophen (NORCO/VICODIN) 5-325 MG tablet Take 1 tablet by mouth 2 (two) times daily as needed.     montelukast (SINGULAIR) 10 MG tablet Take 10 mg by mouth at bedtime.     Multiple Vitamins-Minerals (PRESERVISION AREDS 2) CAPS Take 1 capsule by mouth every evening.     ondansetron (ZOFRAN) 4 MG tablet Take 1 tablet (4 mg total) by mouth every 6 (six) hours as needed for nausea. 20 tablet 0   pantoprazole (PROTONIX) 40 MG tablet Take 1 tablet (40 mg total) by mouth 2 (two) times daily. 60 tablet 3   sertraline (ZOLOFT) 25 MG tablet Take 25 mg  by mouth daily.     simvastatin (ZOCOR) 20 MG tablet Take 20 mg by mouth daily.     vitamin B-12 (CYANOCOBALAMIN) 1000 MCG tablet Take 1 tablet (1,000 mcg total) by mouth daily. 30 tablet 3   No current facility-administered medications for this visit.     Past Surgical History:  Procedure Laterality Date   BIOPSY  12/03/2020   Procedure: BIOPSY;  Surgeon: Rogene Houston, MD;  [facility];  Service: Endoscopy;;  gastric polyps biopsies;   BREAST SURGERY Left    lumpectomy    CESAREAN SECTION  1986   CHOLECYSTECTOMY     COLONOSCOPY WITH PROPOFOL N/A 12/03/2020    two 4-7 mm polyps in cecum, one small polyp in cecum, one 5 mm polyp, sigmoid diverticulosis, path with tubular adenomas   ENTEROSCOPY N/A 02/02/2022   Procedure: ENTEROSCOPY;  Surgeon:  [name], MD;  [facility];  Service: Gastroenterology;  Laterality: N/A;   ESOPHAGOGASTRODUODENOSCOPY (EGD) WITH PROPOFOL N/A 12/03/2020   normal esophagus, multiple gastric polyps s/p biopsy, small paraesophageal hernia, normal duodenum. Fundic gland polyps.   ESOPHAGOGASTRODUODENOSCOPY (EGD) WITH PROPOFOL N/A 02/05/2021   Procedure: ESOPHAGOGASTRODUODENOSCOPY (EGD) WITH PROPOFOL;  Surgeon: [name], MD;  [facility];  Service: Endoscopy;  Laterality: N/A;   ESOPHAGOGASTRODUODENOSCOPY (EGD) WITH PROPOFOL N/A 03/03/2021   Procedure: ESOPHAGOGASTRODUODENOSCOPY (EGD) WITH PROPOFOL;  Surgeon: [name], MD;  [facility];  Service: Gastroenterology;  Laterality: N/A;  with possible push enteroscopy utilizing the pediatric colonoscope   ESOPHAGOGASTRODUODENOSCOPY (EGD) WITH PROPOFOL N/A 02/02/2022   Procedure: ESOPHAGOGASTRODUODENOSCOPY (EGD) WITH PROPOFOL;  Surgeon: [name], MD;  [facility];  Service: Gastroenterology;  Laterality: N/A;   GIVENS CAPSULE STUDY N/A 12/16/2020   normal small bowel capsule but rapid transit time of 21 minutes. Recommended to resume alendronate.    GIVENS CAPSULE STUDY N/A 02/04/2021   Procedure: GIVENS CAPSULE STUDY;  Surgeon: Rogene Houston, MD;  [facility];  Service: Endoscopy;  Laterality: N/A;   HOT HEMOSTASIS  02/05/2021   Procedure: HOT HEMOSTASIS (ARGON PLASMA COAGULATION/BICAP);  Surgeon: [name], MD;  [facility];  Service: Endoscopy;;   HOT HEMOSTASIS  03/03/2021   Procedure: HOT HEMOSTASIS (ARGON PLASMA COAGULATION/BICAP);  Surgeon: [name], [name], MD;  [facility];  Service: Gastroenterology;;   HOT HEMOSTASIS  02/02/2022   Procedure: HOT HEMOSTASIS (ARGON PLASMA COAGULATION/BICAP);  Surgeon: [name], [name], MD;  [facility];  Service: Gastroenterology;;   TONSILLECTOMY     age 36      Allergies  Allergen Reactions   Aspirin     samter's syndrome   Codeine Itching   Sulfa Antibiotics     Unknown reaction   Other Rash    gel filled fentanyl patch, adhesive on that patch caused ithcing      Family History  Problem Relation Age of Onset   Osteoporosis Mother    Other Father        head trauma   Breast cancer Sister        X's 2 sisters     Social History Ms. Gambone reports that she quit smoking about 38 years ago. Her smoking use included cigarettes. She started smoking about 57 years ago. She has a 10.00 pack-year smoking history. She has never used smokeless tobacco. Ms. Neyer reports no history of alcohol use.   Review of Systems CONSTITUTIONAL: No weight loss, fever, chills,  weakness or fatigue.  HEENT: Eyes: No visual loss, blurred vision, double vision or yellow sclerae.No hearing loss, sneezing, congestion, runny nose or sore throat.  SKIN: No rash or itching.  CARDIOVASCULAR: per hpi RESPIRATORY: No shortness of breath, cough or sputum.  GASTROINTESTINAL: No anorexia, nausea, vomiting or diarrhea. No abdominal pain or blood.  GENITOURINARY: No burning on urination, no polyuria NEUROLOGICAL: No headache, dizziness, syncope, paralysis, ataxia, numbness or tingling in the extremities. No change in bowel or bladder control.  MUSCULOSKELETAL: No muscle, back pain, joint pain or stiffness.  LYMPHATICS: No enlarged nodes. No history of splenectomy.  PSYCHIATRIC: No history of depression or anxiety.  ENDOCRINOLOGIC: No reports of sweating, cold or heat intolerance. No polyuria or polydipsia.  Marland Kitchen   Physical Examination Today's Vitals   02/19/22 1019  BP: 134/70  Pulse: 72  SpO2: 96%  Weight: 137 lb 9.6 oz (62.4 kg)  Height: '4\' 10"'$  (1.473 m)   Body mass index is 28.76 kg/m.  Gen: resting comfortably, no acute distress HEENT: no scleral icterus, pupils equal round and reactive, no palptable cervical adenopathy,  CV: RRR, 2/6 systolic  murmur rusb, no jvd Resp: Clear to auscultation bilaterally GI: abdomen is soft, non-tender, non-distended, normal bowel sounds, no hepatosplenomegaly MSK: extremities are warm, no edema.  Skin: warm, no rash Neuro:  no focal deficits Psych: appropriate affect   Diagnostic Studies 12/31/13 Echo LVEF 00-92%, normal diastolic function, mild LAE, mild to mod MR, mild TR   01/2014 stress echo Impressions:  - Normal study after maximal exercise. [facility] treadmill score   is 7 consistent with low risk for major cardiac events.   Very good functional capacity (140% of age and gender   predicted.   Clinic EKG reviewed, LBBB   04/01/16 Clinic EKG: NSR      Assessment and Plan  1. Chronic diastolic HF - recent exacerbation secondary to pRBCs and IVFs in setting of GI bleed. Outside of this has not had prior issues with fluid retention. Some recent orthostaitc symptoms, change lasix to just prn  2. Turbulent LVOT flow/LVH - -turbulent flow in LVOT noted on echo with SAM. Difficult visualization on echo, but appears to have symmetrical wall thicknessat 1.3 cm by my measure. Technically diffiult to measure gradient. I believe the turbulent LVOT flow is due to symmetric moderate hypertrophy with hyperdynamic LV. Would not pursue further imaging at this time. Changing lasix to prn and likely for beta blocker pending heart monitor results both of which would help any symptimatic gradient, if ongoing symptoms could reconsider cMRI.         Arnoldo Lenis, M.D.

## 2022-02-19 NOTE — Telephone Encounter (Signed)
PERCERT:   7 day zio - palps

## 2022-02-22 ENCOUNTER — Encounter (INDEPENDENT_AMBULATORY_CARE_PROVIDER_SITE_OTHER): Payer: Self-pay | Admitting: Ophthalmology

## 2022-02-22 ENCOUNTER — Ambulatory Visit (INDEPENDENT_AMBULATORY_CARE_PROVIDER_SITE_OTHER): Payer: Medicare Other | Admitting: Ophthalmology

## 2022-02-22 DIAGNOSIS — H353211 Exudative age-related macular degeneration, right eye, with active choroidal neovascularization: Secondary | ICD-10-CM

## 2022-02-22 DIAGNOSIS — R002 Palpitations: Secondary | ICD-10-CM | POA: Diagnosis not present

## 2022-02-22 DIAGNOSIS — H353121 Nonexudative age-related macular degeneration, left eye, early dry stage: Secondary | ICD-10-CM

## 2022-02-22 MED ORDER — AFLIBERCEPT 2MG/0.05ML IZ SOLN FOR KALEIDOSCOPE
2.0000 mg | INTRAVITREAL | Status: AC | PRN
  Administered 2022-02-22: 2 mg via INTRAVITREAL

## 2022-02-22 NOTE — Assessment & Plan Note (Signed)
No sign of CNVM by OCT 

## 2022-02-22 NOTE — Progress Notes (Signed)
02/22/2022     CHIEF COMPLAINT Patient presents for  Chief Complaint  Patient presents with   Macular Degeneration      HISTORY OF PRESENT ILLNESS: Alexa Nichols is a 76 y.o. female who presents to the clinic today for:   HPI   History of wet macular degeneration right eye.  With no change in vision over the last 6-week  Medically recent GI bleed found to be due to AVM of the GI vasculature.  Treatment with transfusions and volume resulted in development of CHF, Last edited by Hurman Horn, MD on 02/22/2022  3:26 PM.      Referring physician: [name], MD [place],  [place] [address]  HISTORICAL INFORMATION:   Selected notes from the MEDICAL RECORD NUMBER    Lab Results  Component Value Date   HGBA1C 4.6 (L) 02/17/2021     CURRENT MEDICATIONS: No current outpatient medications on file. (Ophthalmic Drugs)   No current facility-administered medications for this visit. (Ophthalmic Drugs)   Current Outpatient Medications (Other)  Medication Sig   acetaminophen (TYLENOL) 500 MG tablet Take 500 mg by mouth every 6 (six) hours as needed for moderate pain or headache.   alendronate (FOSAMAX) 70 MG tablet Take 70 mg by mouth once a week.   ALPRAZolam (XANAX) 0.5 MG tablet Take 0.25-0.5 mg by mouth daily as needed.   ferrous sulfate 325 (65 FE) MG EC tablet Take 1 tablet (325 mg total) by mouth 2 (two) times daily with a meal.   Fluticasone-Umeclidin-Vilant (TRELEGY ELLIPTA) 200-62.5-25 MCG/ACT AEPB Inhale 1 puff into the lungs daily.   furosemide (LASIX) 20 MG tablet Take 1 tablet (20 mg total) by mouth as needed for edema (shorness of breath or swelling).   HYDROcodone-acetaminophen (NORCO/VICODIN) 5-325 MG tablet Take 1 tablet by mouth 2 (two) times daily as needed.   montelukast (SINGULAIR) 10 MG tablet Take 10 mg by mouth at bedtime.   Multiple Vitamins-Minerals (PRESERVISION AREDS 2) CAPS Take 1 capsule by mouth every evening.   ondansetron (ZOFRAN) 4  MG tablet Take 1 tablet (4 mg total) by mouth every 6 (six) hours as needed for nausea.   pantoprazole (PROTONIX) 40 MG tablet Take 1 tablet (40 mg total) by mouth 2 (two) times daily.   sertraline (ZOLOFT) 25 MG tablet Take 25 mg by mouth daily.   simvastatin (ZOCOR) 20 MG tablet Take 20 mg by mouth daily.   vitamin B-12 (CYANOCOBALAMIN) 1000 MCG tablet Take 1 tablet (1,000 mcg total) by mouth daily.   No current facility-administered medications for this visit. (Other)      REVIEW OF SYSTEMS: ROS   Negative for: Constitutional, Gastrointestinal, Neurological, Skin, Genitourinary, Musculoskeletal, HENT, Endocrine, Cardiovascular, Eyes, Respiratory, Psychiatric, Allergic/Imm, Heme/Lymph Last edited by Hurman Horn, MD on 02/22/2022  2:40 PM.       ALLERGIES Allergies  Allergen Reactions   Aspirin     samter's syndrome   Codeine Itching   Sulfa Antibiotics     Unknown reaction   Other Rash    gel filled fentanyl patch, adhesive on that patch caused ithcing    PAST MEDICAL HISTORY Past Medical History:  Diagnosis Date   Asthma    Chronic low back pain    COPD (chronic obstructive pulmonary disease) (HCC)    Depression    GERD (gastroesophageal reflux disease)    Guillain Barr syndrome (HCC)    Heart murmur    Lobular carcinoma of left breast ([place]) 1997  in situ   Other and unspecified hyperlipidemia    Seasonal allergies    Past Surgical History:  Procedure Laterality Date   BIOPSY  12/03/2020   Procedure: BIOPSY;  Surgeon: Rogene Houston, MD;  [facility];  Service: Endoscopy;;  gastric polyps biopsies;   BREAST SURGERY Left    lumpectomy    CESAREAN SECTION  1986   CHOLECYSTECTOMY     COLONOSCOPY WITH PROPOFOL N/A 12/03/2020    two 4-7 mm polyps in cecum, one small polyp in cecum, one 5 mm polyp, sigmoid diverticulosis, path with tubular adenomas   ENTEROSCOPY N/A 02/02/2022   Procedure: ENTEROSCOPY;  Surgeon: [name], MD;   [facility];  Service: Gastroenterology;  Laterality: N/A;   ESOPHAGOGASTRODUODENOSCOPY (EGD) WITH PROPOFOL N/A 12/03/2020   normal esophagus, multiple gastric polyps s/p biopsy, small paraesophageal hernia, normal duodenum. Fundic gland polyps.   ESOPHAGOGASTRODUODENOSCOPY (EGD) WITH PROPOFOL N/A 02/05/2021   Procedure: ESOPHAGOGASTRODUODENOSCOPY (EGD) WITH PROPOFOL;  Surgeon: [name], MD;  [facility];  Service: Endoscopy;  Laterality: N/A;   ESOPHAGOGASTRODUODENOSCOPY (EGD) WITH PROPOFOL N/A 03/03/2021   Procedure: ESOPHAGOGASTRODUODENOSCOPY (EGD) WITH PROPOFOL;  Surgeon: [name], MD;  [facility];  Service: Gastroenterology;  Laterality: N/A;  with possible push enteroscopy utilizing the pediatric colonoscope   ESOPHAGOGASTRODUODENOSCOPY (EGD) WITH PROPOFOL N/A 02/02/2022   Procedure: ESOPHAGOGASTRODUODENOSCOPY (EGD) WITH PROPOFOL;  Surgeon: [name], MD;  [facility];  Service: Gastroenterology;  Laterality: N/A;   GIVENS CAPSULE STUDY N/A 12/16/2020   normal small bowel capsule but rapid transit time of 21 minutes. Recommended to resume alendronate.    GIVENS CAPSULE STUDY N/A 02/04/2021   Procedure: GIVENS CAPSULE STUDY;  Surgeon: Rogene Houston, MD;  [facility];  Service: Endoscopy;  Laterality: N/A;   HOT HEMOSTASIS  02/05/2021   Procedure: HOT HEMOSTASIS (ARGON PLASMA COAGULATION/BICAP);  Surgeon: [name], MD;  [facility];  Service: Endoscopy;;   HOT HEMOSTASIS  03/03/2021   Procedure: HOT HEMOSTASIS (ARGON PLASMA COAGULATION/BICAP);  Surgeon: [name], [name], MD;  [facility];  Service: Gastroenterology;;   HOT HEMOSTASIS  02/02/2022   Procedure: HOT HEMOSTASIS (ARGON PLASMA COAGULATION/BICAP);  Surgeon: [name], [name], MD;  [facility];  Service: Gastroenterology;;   TONSILLECTOMY     age 37    FAMILY HISTORY Family History   Problem Relation Age of Onset   Osteoporosis Mother    Other Father        head trauma   Breast cancer Sister        X's 2 sisters    SOCIAL HISTORY Social History   Tobacco Use   Smoking status: Former    Packs/day: 0.50    Years: 20.00    Pack years: 10.00    Types: Cigarettes    Start date: 09/16/1964    Quit date: 10/05/1983    Years since quitting: 38.4   Smokeless tobacco: Never  Vaping Use   Vaping Use: Never used  Substance Use Topics   Alcohol use: Never    Alcohol/week: 0.0 standard drinks   Drug use: Never         OPHTHALMIC EXAM:  Base Eye Exam     Visual Acuity (ETDRS)       Right Left   Dist [place] 20/200 20/25 +2         Tonometry (Tonopen, 2:40 PM)       Right  Left   Pressure 13 14         Neuro/Psych     Oriented x3: Yes   Mood/Affect: Normal           Slit Lamp and Fundus Exam     External Exam       Right Left   External Normal Normal         Slit Lamp Exam       Right Left   Lids/Lashes Normal Normal   Conjunctiva/Sclera White and quiet White and quiet   Cornea Clear Clear   Anterior Chamber Deep and quiet Deep and quiet   Iris Round and reactive Round and reactive   Lens Posterior chamber intraocular lens Posterior chamber intraocular lens   Anterior Vitreous Normal Normal         Fundus Exam       Right Left   Posterior Vitreous Clear vitrectomized    Disc Normal    C/D Ratio 0.2    Macula Macular thickening, Hard drusen, Retinal pigment epithelial rip.   Retinal pigment epithelial detachment, Intermediate age related macular degeneration, no hemorrhage    Vessels Normal    Periphery Normal             IMAGING AND PROCEDURES  Imaging and Procedures for 02/22/22  OCT, Retina - OU - Both Eyes       Right Eye Quality was good. Scan locations included subfoveal. Central Foveal Thickness: 391. Progression has improved. Findings include abnormal foveal contour, outer retinal tubulation, subretinal  hyper-reflective material, disciform scar, choroidal neovascular membrane, no IRF.   Left Eye Quality was good. Scan locations included subfoveal. Central Foveal Thickness: 305. Progression has been stable. Findings include retinal drusen , normal foveal contour, no IRF, no SRF.   Notes Recurrent CNVM yet now overall improving macular findings and anatomy we will need to repeat injection today as macular anatomy has stabilized at 6-week follow-up.  We will repeat injection of Eylea today OD and change again to 7-week follow-up     Intravitreal Injection, Pharmacologic Agent - OD - Right Eye       Time Out 02/22/2022. 3:25 PM. Confirmed correct patient, procedure, site, and patient consented.   Anesthesia Topical anesthesia was used. Anesthetic medications included Lidocaine 4%.   Procedure Preparation included Tobramycin 0.3%, 10% betadine to eyelids, 5% betadine to ocular surface. A 30 gauge needle was used.   Injection: 2 mg aflibercept 2 MG/0.05ML   Route: Intravitreal, Site: Right Eye   NDC: [id number], Lot: [id number], Waste: 0 mL   Post-op Post injection exam found visual acuity of at least counting fingers. The patient tolerated the procedure well. There were no complications. The patient received written and verbal post procedure care education. Post injection medications included ocuflox.              ASSESSMENT/PLAN:  Exudative age-related macular degeneration of right eye with active choroidal neovascularization (HCC) OD today follow-up interval of 6 weeks post injection Eylea.,  Repeat injection today to maintain excellent OCT appearance, and maintain vision.  Follow-up next in 7 weeks    Early stage nonexudative age-related macular degeneration of left eye No sign of CNVM by OCT     ICD-10-CM   1. Exudative age-related macular degeneration of right eye with active choroidal neovascularization (HCC)  H35.3211 OCT, Retina - OU - Both Eyes    Intravitreal  Injection, Pharmacologic Agent - OD - Right Eye    aflibercept (EYLEA) SOLN 2 mg  2. Early stage nonexudative age-related macular degeneration of left eye  H35.3121       1.  OD overall vastly improved macular findings stable at 6-week interval.  Repeat injection Eylea today to maintain follow-up next in 7 weeks in an attempt to extend interval decrease treatment burden  2.  OS remained stable  3.  Ophthalmic Meds Ordered this visit:  Meds ordered this encounter  Medications   aflibercept (EYLEA) SOLN 2 mg       Return in about 7 weeks (around 04/12/2022) for dilate, OD, EYLEA OCT.  There are no Patient Instructions on file for this visit.   Explained the diagnoses, plan, and follow up with the patient and they expressed understanding.  Patient expressed understanding of the importance of proper follow up care.   Clent Demark Twilla Khouri M.D. Diseases & Surgery of the Retina and Vitreous [facility] 02/22/22     Abbreviations: M myopia (nearsighted); A astigmatism; H hyperopia (farsighted); P presbyopia; Mrx spectacle prescription;  CTL contact lenses; OD right eye; OS left eye; OU both eyes  XT exotropia; ET esotropia; PEK punctate epithelial keratitis; PEE punctate epithelial erosions; DES dry eye syndrome; MGD meibomian gland dysfunction; ATs artificial tears; PFAT's preservative free artificial tears; Rote nuclear sclerotic cataract; PSC posterior subcapsular cataract; ERM epi-retinal membrane; PVD posterior vitreous detachment; RD retinal detachment; DM diabetes mellitus; DR diabetic retinopathy; NPDR non-proliferative diabetic retinopathy; PDR proliferative diabetic retinopathy; CSME clinically significant macular edema; DME diabetic macular edema; dbh dot blot hemorrhages; CWS cotton wool spot; POAG primary open angle glaucoma; C/D cup-to-disc ratio; HVF humphrey visual field; GVF goldmann visual field; OCT optical coherence tomography; IOP intraocular pressure;  BRVO Branch retinal vein occlusion; CRVO central retinal vein occlusion; CRAO central retinal artery occlusion; BRAO branch retinal artery occlusion; RT retinal tear; SB scleral buckle; PPV pars plana vitrectomy; VH Vitreous hemorrhage; PRP panretinal laser photocoagulation; IVK intravitreal kenalog; VMT vitreomacular traction; MH Macular hole;  NVD neovascularization of the disc; NVE neovascularization elsewhere; AREDS age related eye disease study; ARMD age related macular degeneration; POAG primary open angle glaucoma; EBMD epithelial/anterior basement membrane dystrophy; ACIOL anterior chamber intraocular lens; IOL intraocular lens; PCIOL posterior chamber intraocular lens; Phaco/IOL phacoemulsification with intraocular lens placement; Vanduser photorefractive keratectomy; LASIK laser assisted in situ keratomileusis; HTN hypertension; DM diabetes mellitus; COPD chronic obstructive pulmonary disease

## 2022-02-22 NOTE — Assessment & Plan Note (Addendum)
OD today follow-up interval of 6 weeks post injection Eylea.,  Repeat injection today to maintain excellent OCT appearance, and maintain vision.  Follow-up next in 7 weeks

## 2022-02-25 ENCOUNTER — Ambulatory Visit (INDEPENDENT_AMBULATORY_CARE_PROVIDER_SITE_OTHER): Payer: Medicare Other | Admitting: Gastroenterology

## 2022-02-25 ENCOUNTER — Encounter (INDEPENDENT_AMBULATORY_CARE_PROVIDER_SITE_OTHER): Payer: Self-pay | Admitting: Gastroenterology

## 2022-02-25 VITALS — BP 127/70 | HR 71 | Temp 98.0°F | Ht <= 58 in | Wt 138.9 lb

## 2022-02-25 DIAGNOSIS — K226 Gastro-esophageal laceration-hemorrhage syndrome: Secondary | ICD-10-CM | POA: Diagnosis not present

## 2022-02-25 DIAGNOSIS — K31819 Angiodysplasia of stomach and duodenum without bleeding: Secondary | ICD-10-CM | POA: Diagnosis not present

## 2022-02-25 NOTE — Patient Instructions (Addendum)
Perform blood workup Continue pantoprazole 40 mg twice a day

## 2022-02-25 NOTE — Progress Notes (Signed)
Alexa Nichols, M.D. Gastroenterology & Hepatology [facility] [address] [place], [place] [address]  Primary Care Physician: [name], MD [place] [id number]  I will communicate my assessment and recommendations to the referring MD via EMR.  Problems: Alexa Nichols tear Recurrent gastric and SB AVMs  History of Present Illness: Alexa Nichols is a 76 y.o. with past medical history of Alexa Nichols, recurrent iron deficiency anemia due to AVMs, COPD, asthma, depression, GERD, carcinoma of the breast, who presents for follow after recent hospitalization for hematemesis and anemia.  The patient was last seen on 02/01/2022 after being hospitalized for recurrent hematemesis and severe drop in her hemoglobin down to 7.6 at [facility]. At that time, the patient underwent an EGD on 02/02/2022 which showed presence of 6 mm bleeding Alexa-Weiss linear tear with stigmata of recent bleeding was found (adhered clot) - there was evidence of ongoing oozing after removing the clot..  For hemostasis, one hemostatic clip was successfully placed. Hematin was found in the entire examined stomach. A single 4 mm angiodysplastic lesion with stigmata of recent bleeding was found on the greater curvature of the stomach, this was treated with APC.  Duodenum was normal.  Patient was given the hospital with PPI twice daily and her diet could be advanced, she was eventually discharged from the hospital.  Patient came back to the hospital 2 days after leaving the hospital as she was feeling very fatigued and having chest tightness.  Hemoglobin was above 8 and was stable compared to prior.  She underwent a TTE and was found diastolic dysfunction with possible HOCM changes. She was seen by Dr. Harl Bowie recently and had a ZIO monitor placed last Monday.  She denies having any gastrointestinal symptoms at the moment. Specifically, she denies having  any more hematemesis or coffee gound vomiting. She has had persistent lightheadedness but denies any syncopal events.  She has presented intermittent episodes of short-span abdominal pain in her upper abdomen, which she describes as stinging pain. It usually lasts for a minute but it goes away on its own, it is not happening frequently.  She is currently taking iron PO twice a day and taking pantoprazole 40 mg twice a day.  The patient denies having any nausea, vomiting, fever, chills, hematochezia, melena, hematemesis, abdominal distention, abdominal pain, diarrhea, jaundice, pruritus or weight loss.  Last EGD: as above Last Colonoscopy: 12/03/2020, 2 polyps resected from the cecum 3 polyps resected from the cecum with largest size of 7 mm, there was a polyp of 5 mm in the hepatic flexure, diverticulosis and external hemorrhoids.  Pathology of the polyps was consistent with a tubular adenoma.  She was recommended to have a repeat colonoscopy in 5 years  Past Medical History: Past Medical History:  Diagnosis Date   Asthma    Chronic low back pain    COPD (chronic obstructive pulmonary disease) (HCC)    Depression    GERD (gastroesophageal reflux disease)    Guillain Barr syndrome (HCC)    Heart murmur    Lobular carcinoma of left breast ([place]) 1997   in situ   Other and unspecified hyperlipidemia    Seasonal allergies     Past Surgical History: Past Surgical History:  Procedure Laterality Date   BIOPSY  12/03/2020   Procedure: BIOPSY;  Surgeon: Rogene Houston, MD;  [facility];  Service: Endoscopy;;  gastric polyps biopsies;   BREAST SURGERY Left  lumpectomy    CESAREAN SECTION  1986   CHOLECYSTECTOMY     COLONOSCOPY WITH PROPOFOL N/A 12/03/2020    two 4-7 mm polyps in cecum, one small polyp in cecum, one 5 mm polyp, sigmoid diverticulosis, path with tubular adenomas   ENTEROSCOPY N/A 02/02/2022   Procedure: ENTEROSCOPY;  Surgeon: [name], MD;   [facility];  Service: Gastroenterology;  Laterality: N/A;   ESOPHAGOGASTRODUODENOSCOPY (EGD) WITH PROPOFOL N/A 12/03/2020   normal esophagus, multiple gastric polyps s/p biopsy, small paraesophageal hernia, normal duodenum. Fundic gland polyps.   ESOPHAGOGASTRODUODENOSCOPY (EGD) WITH PROPOFOL N/A 02/05/2021   Procedure: ESOPHAGOGASTRODUODENOSCOPY (EGD) WITH PROPOFOL;  Surgeon: [name], MD;  [facility];  Service: Endoscopy;  Laterality: N/A;   ESOPHAGOGASTRODUODENOSCOPY (EGD) WITH PROPOFOL N/A 03/03/2021   Procedure: ESOPHAGOGASTRODUODENOSCOPY (EGD) WITH PROPOFOL;  Surgeon: [name], MD;  [facility];  Service: Gastroenterology;  Laterality: N/A;  with possible push enteroscopy utilizing the pediatric colonoscope   ESOPHAGOGASTRODUODENOSCOPY (EGD) WITH PROPOFOL N/A 02/02/2022   Procedure: ESOPHAGOGASTRODUODENOSCOPY (EGD) WITH PROPOFOL;  Surgeon: [name], MD;  [facility];  Service: Gastroenterology;  Laterality: N/A;   GIVENS CAPSULE STUDY N/A 12/16/2020   normal small bowel capsule but rapid transit time of 21 minutes. Recommended to resume alendronate.    GIVENS CAPSULE STUDY N/A 02/04/2021   Procedure: GIVENS CAPSULE STUDY;  Surgeon: Rogene Houston, MD;  [facility];  Service: Endoscopy;  Laterality: N/A;   HOT HEMOSTASIS  02/05/2021   Procedure: HOT HEMOSTASIS (ARGON PLASMA COAGULATION/BICAP);  Surgeon: [name], MD;  [facility];  Service: Endoscopy;;   HOT HEMOSTASIS  03/03/2021   Procedure: HOT HEMOSTASIS (ARGON PLASMA COAGULATION/BICAP);  Surgeon: [name], [name], MD;  [facility];  Service: Gastroenterology;;   HOT HEMOSTASIS  02/02/2022   Procedure: HOT HEMOSTASIS (ARGON PLASMA COAGULATION/BICAP);  Surgeon: [name], [name], MD;  [facility];  Service: Gastroenterology;;   TONSILLECTOMY     age 34    Family History: Family History   Problem Relation Age of Onset   Osteoporosis Mother    Other Father        head trauma   Breast cancer Sister        X's 2 sisters    Social History: Social History   Tobacco Use  Smoking Status Former   Packs/day: 0.50   Years: 20.00   Pack years: 10.00   Types: Cigarettes   Start date: 09/16/1964   Quit date: 10/05/1983   Years since quitting: 38.4  Smokeless Tobacco Never   Social History   Substance and Sexual Activity  Alcohol Use Never   Alcohol/week: 0.0 standard drinks   Social History   Substance and Sexual Activity  Drug Use Never    Allergies: Allergies  Allergen Reactions   Aspirin     samter's syndrome   Codeine Itching   Sulfa Antibiotics     Unknown reaction   Other Rash    gel filled fentanyl patch, adhesive on that patch caused ithcing    Medications: Current Outpatient Medications  Medication Sig Dispense Refill   acetaminophen (TYLENOL) 500 MG tablet Take 500 mg by mouth every 6 (six) hours as needed for moderate pain or headache.     alendronate (FOSAMAX) 70 MG tablet Take 70 mg by mouth once a week.     ALPRAZolam (XANAX) 0.5 MG tablet Take 0.25-0.5 mg by mouth daily as needed.  ferrous sulfate 325 (65 FE) MG EC tablet Take 1 tablet (325 mg total) by mouth 2 (two) times daily with a meal. 90 tablet 3   Fluticasone-Umeclidin-Vilant (TRELEGY ELLIPTA) 200-62.5-25 MCG/ACT AEPB Inhale 1 puff into the lungs daily. 28 each 3   furosemide (LASIX) 20 MG tablet Take 1 tablet (20 mg total) by mouth as needed for edema (shorness of breath or swelling).     HYDROcodone-acetaminophen (NORCO/VICODIN) 5-325 MG tablet Take 1 tablet by mouth 2 (two) times daily as needed.     montelukast (SINGULAIR) 10 MG tablet Take 10 mg by mouth at bedtime.     Multiple Vitamins-Minerals (PRESERVISION AREDS 2) CAPS Take 1 capsule by mouth every evening.     ondansetron (ZOFRAN) 4 MG tablet Take 1 tablet (4 mg total) by mouth every 6 (six) hours as needed for nausea.  20 tablet 0   pantoprazole (PROTONIX) 40 MG tablet Take 1 tablet (40 mg total) by mouth 2 (two) times daily. 60 tablet 3   sertraline (ZOLOFT) 25 MG tablet Take 25 mg by mouth daily.     simvastatin (ZOCOR) 20 MG tablet Take 20 mg by mouth daily.     vitamin B-12 (CYANOCOBALAMIN) 1000 MCG tablet Take 1 tablet (1,000 mcg total) by mouth daily. 30 tablet 3   No current facility-administered medications for this visit.    Review of Systems: GENERAL: negative for malaise, night sweats HEENT: No changes in hearing or vision, no nose bleeds or other nasal problems. NECK: Negative for lumps, goiter, pain and significant neck swelling RESPIRATORY: Negative for cough, wheezing CARDIOVASCULAR: Negative for chest pain, leg swelling, palpitations, orthopnea GI: SEE HPI MUSCULOSKELETAL: Negative for joint pain or swelling, back pain, and muscle pain. SKIN: Negative for lesions, rash PSYCH: Negative for sleep disturbance, mood disorder and recent psychosocial stressors. HEMATOLOGY Negative for prolonged bleeding, bruising easily, and swollen nodes. ENDOCRINE: Negative for cold or heat intolerance, polyuria, polydipsia and goiter. NEURO: negative for tremor, gait imbalance, syncope and seizures. The remainder of the review of systems is noncontributory.   Physical Exam: BP 127/70 (BP Location: Right Arm, Patient Position: Sitting, Cuff Size: Large)   Pulse 71   Temp 98 F (36.7 C) (Oral)   Ht '4\' 10"'$  (1.473 m)   Wt 138 lb 14.4 oz (63 kg)   BMI 29.03 kg/m  GENERAL: The patient is AO x3, in no acute distress. HEENT: Head is normocephalic and atraumatic. EOMI are intact. Mouth is well hydrated and without lesions. NECK: Supple. No masses LUNGS: Clear to auscultation. No presence of rhonchi/wheezing/rales. Adequate chest expansion HEART: RRR, normal s1 and s2. ABDOMEN: Soft, nontender, no guarding, no peritoneal signs, and nondistended. BS +. No masses. EXTREMITIES: Without any cyanosis,  clubbing, rash, lesions or edema. NEUROLOGIC: AOx3, no focal motor deficit. SKIN: no jaundice, no rashes  Imaging/Labs: as above  I personally reviewed and interpreted the available labs, imaging and endoscopic files.  Impression and Plan: Alexa Nichols is a 76 y.o. with past medical history of Alexa Nichols, recurrent iron deficiency anemia due to AVMs, COPD, asthma, depression, GERD, carcinoma of the breast, who presents for follow after recent hospitalization for hematemesis and anemia.  The patient had an episode of upper gastrointestinal bleeding due to a combination of Alexa-Weiss tear and gastric AVMs which were treated endoscopically.  Since then she has not presented any overt clinical bleeding.  She reports feeling better and has not presented any new complaints.  We will recheck CBC today to assess if  her hemoglobin has improved.  We will follow her up in clinic in 3 months.  For now she needs to continue taking pantoprazole 40 mg twice a day, may consider decreasing the dose in the future.  - Perform blood workup - Continue pantoprazole 40 mg twice a day  All questions were answered.      [name], MD Gastroenterology and Hepatology [facility]

## 2022-02-26 LAB — CBC WITH DIFFERENTIAL/PLATELET
Basophils Absolute: 0 10*3/uL (ref 0.0–0.2)
Basos: 1 %
EOS (ABSOLUTE): 0.3 10*3/uL (ref 0.0–0.4)
Eos: 4 %
Hematocrit: 36 % (ref 34.0–46.6)
Hemoglobin: 11.5 g/dL (ref 11.1–15.9)
Immature Grans (Abs): 0 10*3/uL (ref 0.0–0.1)
Immature Granulocytes: 0 %
Lymphocytes Absolute: 1.8 10*3/uL (ref 0.7–3.1)
Lymphs: 25 %
MCH: 29.3 pg (ref 26.6–33.0)
MCHC: 31.9 g/dL (ref 31.5–35.7)
MCV: 92 fL (ref 79–97)
Monocytes Absolute: 0.7 10*3/uL (ref 0.1–0.9)
Monocytes: 10 %
Neutrophils Absolute: 4.3 10*3/uL (ref 1.4–7.0)
Neutrophils: 60 %
Platelets: 246 10*3/uL (ref 150–450)
RBC: 3.92 x10E6/uL (ref 3.77–5.28)
RDW: 14.7 % (ref 11.7–15.4)
WBC: 7.2 10*3/uL (ref 3.4–10.8)

## 2022-03-02 ENCOUNTER — Encounter (INDEPENDENT_AMBULATORY_CARE_PROVIDER_SITE_OTHER): Payer: Medicare Other | Admitting: Ophthalmology

## 2022-03-03 DIAGNOSIS — F419 Anxiety disorder, unspecified: Secondary | ICD-10-CM | POA: Diagnosis not present

## 2022-03-03 DIAGNOSIS — I10 Essential (primary) hypertension: Secondary | ICD-10-CM | POA: Diagnosis not present

## 2022-03-12 DIAGNOSIS — R002 Palpitations: Secondary | ICD-10-CM | POA: Diagnosis not present

## 2022-03-19 ENCOUNTER — Other Ambulatory Visit (INDEPENDENT_AMBULATORY_CARE_PROVIDER_SITE_OTHER): Payer: Self-pay | Admitting: Gastroenterology

## 2022-03-19 NOTE — Telephone Encounter (Signed)
She is supposedly taking it twice a day. Can you please confirm with her how much she is taking?

## 2022-03-19 NOTE — Telephone Encounter (Signed)
Last seen 02/25/2022 for AVM here by Dr. Jenetta Downer. Was she supposed to continue?

## 2022-03-19 NOTE — Telephone Encounter (Signed)
Yes, per patient she is taking this bid.

## 2022-03-19 NOTE — Telephone Encounter (Signed)
I'll refill it and we will discuss if we can decrease it in next appointment

## 2022-04-12 ENCOUNTER — Encounter (INDEPENDENT_AMBULATORY_CARE_PROVIDER_SITE_OTHER): Payer: Medicare Other | Admitting: Ophthalmology

## 2022-04-12 ENCOUNTER — Ambulatory Visit (INDEPENDENT_AMBULATORY_CARE_PROVIDER_SITE_OTHER): Payer: Medicare Other | Admitting: Ophthalmology

## 2022-04-12 ENCOUNTER — Encounter (INDEPENDENT_AMBULATORY_CARE_PROVIDER_SITE_OTHER): Payer: Self-pay | Admitting: Ophthalmology

## 2022-04-12 DIAGNOSIS — H353122 Nonexudative age-related macular degeneration, left eye, intermediate dry stage: Secondary | ICD-10-CM

## 2022-04-12 DIAGNOSIS — H353121 Nonexudative age-related macular degeneration, left eye, early dry stage: Secondary | ICD-10-CM | POA: Diagnosis not present

## 2022-04-12 DIAGNOSIS — H353211 Exudative age-related macular degeneration, right eye, with active choroidal neovascularization: Secondary | ICD-10-CM

## 2022-04-12 MED ORDER — AFLIBERCEPT 2MG/0.05ML IZ SOLN FOR KALEIDOSCOPE
2.0000 mg | INTRAVITREAL | Status: AC | PRN
  Administered 2022-04-12: 2 mg via INTRAVITREAL

## 2022-04-12 NOTE — Assessment & Plan Note (Signed)
Persistent subretinal fluid remains on Eylea.  Currently at 7-week interval.  Repeat injection today  Consider change to Vabysmo next

## 2022-04-12 NOTE — Assessment & Plan Note (Signed)
No sign of CNVM OS continue with oral vitamin therapy

## 2022-04-12 NOTE — Assessment & Plan Note (Signed)
No sign of CNVM OS by OCT today

## 2022-04-12 NOTE — Progress Notes (Signed)
04/12/2022     CHIEF COMPLAINT Patient presents for  Chief Complaint  Patient presents with   Macular Degeneration      HISTORY OF PRESENT ILLNESS: Alexa Nichols is a 76 y.o. female who presents to the clinic today for:   HPI   7 weeks for DILATE, OD, EYLEA, OCT. Pt stated, "Constant blur right in the middle. But other than that, vision has been stable." Pt reports vision has been stable.    Last edited by Silvestre Moment on 04/12/2022  1:36 PM.      Referring physician: [name], MD [place],  [place] [address]  HISTORICAL INFORMATION:   Selected notes from the MEDICAL RECORD NUMBER    Lab Results  Component Value Date   HGBA1C 4.6 (L) 02/17/2021     CURRENT MEDICATIONS: No current outpatient medications on file. (Ophthalmic Drugs)   No current facility-administered medications for this visit. (Ophthalmic Drugs)   Current Outpatient Medications (Other)  Medication Sig   acetaminophen (TYLENOL) 500 MG tablet Take 500 mg by mouth every 6 (six) hours as needed for moderate pain or headache.   alendronate (FOSAMAX) 70 MG tablet Take 70 mg by mouth once a week.   ALPRAZolam (XANAX) 0.5 MG tablet Take 0.25-0.5 mg by mouth daily as needed.   ferrous sulfate 325 (65 FE) MG tablet Take 1 tablet (325 mg total) by mouth in the morning and at bedtime.   Fluticasone-Umeclidin-Vilant (TRELEGY ELLIPTA) 200-62.5-25 MCG/ACT AEPB Inhale 1 puff into the lungs daily.   furosemide (LASIX) 20 MG tablet Take 1 tablet (20 mg total) by mouth as needed for edema (shorness of breath or swelling).   HYDROcodone-acetaminophen (NORCO/VICODIN) 5-325 MG tablet Take 1 tablet by mouth 2 (two) times daily as needed.   montelukast (SINGULAIR) 10 MG tablet Take 10 mg by mouth at bedtime.   Multiple Vitamins-Minerals (PRESERVISION AREDS 2) CAPS Take 1 capsule by mouth every evening.   ondansetron (ZOFRAN) 4 MG tablet Take 1 tablet (4 mg total) by mouth every 6 (six) hours as needed for  nausea.   pantoprazole (PROTONIX) 40 MG tablet Take 1 tablet (40 mg total) by mouth 2 (two) times daily.   sertraline (ZOLOFT) 25 MG tablet Take 25 mg by mouth daily.   simvastatin (ZOCOR) 20 MG tablet Take 20 mg by mouth daily.   vitamin B-12 (CYANOCOBALAMIN) 1000 MCG tablet Take 1 tablet (1,000 mcg total) by mouth daily.   No current facility-administered medications for this visit. (Other)      REVIEW OF SYSTEMS: ROS   Negative for: Constitutional, Gastrointestinal, Neurological, Skin, Genitourinary, Musculoskeletal, HENT, Endocrine, Cardiovascular, Eyes, Respiratory, Psychiatric, Allergic/Imm, Heme/Lymph Last edited by Silvestre Moment on 04/12/2022  1:36 PM.       ALLERGIES Allergies  Allergen Reactions   Aspirin     samter's syndrome   Codeine Itching   Sulfa Antibiotics     Unknown reaction   Other Rash    gel filled fentanyl patch, adhesive on that patch caused ithcing    PAST MEDICAL HISTORY Past Medical History:  Diagnosis Date   Asthma    Chronic low back pain    COPD (chronic obstructive pulmonary disease) (HCC)    Depression    GERD (gastroesophageal reflux disease)    Guillain Barr syndrome (HCC)    Heart murmur    Lobular carcinoma of left breast ([place]) 1997   in situ   Other and unspecified hyperlipidemia    Seasonal allergies  Past Surgical History:  Procedure Laterality Date   BIOPSY  12/03/2020   Procedure: BIOPSY;  Surgeon: Rogene Houston, MD;  [facility];  Service: Endoscopy;;  gastric polyps biopsies;   BREAST SURGERY Left    lumpectomy    CESAREAN SECTION  1986   CHOLECYSTECTOMY     COLONOSCOPY WITH PROPOFOL N/A 12/03/2020    two 4-7 mm polyps in cecum, one small polyp in cecum, one 5 mm polyp, sigmoid diverticulosis, path with tubular adenomas   ENTEROSCOPY N/A 02/02/2022   Procedure: ENTEROSCOPY;  Surgeon: [name], MD;  [facility];  Service: Gastroenterology;  Laterality: N/A;    ESOPHAGOGASTRODUODENOSCOPY (EGD) WITH PROPOFOL N/A 12/03/2020   normal esophagus, multiple gastric polyps s/p biopsy, small paraesophageal hernia, normal duodenum. Fundic gland polyps.   ESOPHAGOGASTRODUODENOSCOPY (EGD) WITH PROPOFOL N/A 02/05/2021   Procedure: ESOPHAGOGASTRODUODENOSCOPY (EGD) WITH PROPOFOL;  Surgeon: [name], MD;  [facility];  Service: Endoscopy;  Laterality: N/A;   ESOPHAGOGASTRODUODENOSCOPY (EGD) WITH PROPOFOL N/A 03/03/2021   Procedure: ESOPHAGOGASTRODUODENOSCOPY (EGD) WITH PROPOFOL;  Surgeon: [name], MD;  [facility];  Service: Gastroenterology;  Laterality: N/A;  with possible push enteroscopy utilizing the pediatric colonoscope   ESOPHAGOGASTRODUODENOSCOPY (EGD) WITH PROPOFOL N/A 02/02/2022   Procedure: ESOPHAGOGASTRODUODENOSCOPY (EGD) WITH PROPOFOL;  Surgeon: [name], MD;  [facility];  Service: Gastroenterology;  Laterality: N/A;   GIVENS CAPSULE STUDY N/A 12/16/2020   normal small bowel capsule but rapid transit time of 21 minutes. Recommended to resume alendronate.    GIVENS CAPSULE STUDY N/A 02/04/2021   Procedure: GIVENS CAPSULE STUDY;  Surgeon: Rogene Houston, MD;  [facility];  Service: Endoscopy;  Laterality: N/A;   HOT HEMOSTASIS  02/05/2021   Procedure: HOT HEMOSTASIS (ARGON PLASMA COAGULATION/BICAP);  Surgeon: [name], MD;  [facility];  Service: Endoscopy;;   HOT HEMOSTASIS  03/03/2021   Procedure: HOT HEMOSTASIS (ARGON PLASMA COAGULATION/BICAP);  Surgeon: [name], [name], MD;  [facility];  Service: Gastroenterology;;   HOT HEMOSTASIS  02/02/2022   Procedure: HOT HEMOSTASIS (ARGON PLASMA COAGULATION/BICAP);  Surgeon: [name], [name], MD;  [facility];  Service: Gastroenterology;;   TONSILLECTOMY     age 48    FAMILY HISTORY Family History  Problem Relation Age of Onset   Osteoporosis Mother    Other Father         head trauma   Breast cancer Sister        X's 2 sisters    SOCIAL HISTORY Social History   Tobacco Use   Smoking status: Former    Packs/day: 0.50    Years: 20.00    Total pack years: 10.00    Types: Cigarettes    Start date: 09/16/1964    Quit date: 10/05/1983    Years since quitting: 38.5   Smokeless tobacco: Never  Vaping Use   Vaping Use: Never used  Substance Use Topics   Alcohol use: Never    Alcohol/week: 0.0 standard drinks of alcohol   Drug use: Never         OPHTHALMIC EXAM:  Base Eye Exam     Visual Acuity (ETDRS)       Right Left   Dist cc 20/200 20/20 -1   Dist ph cc 20/150 -1     Correction: Glasses         Tonometry (Tonopen, 1:40 PM)       Right  Left   Pressure 10 10         Pupils       Pupils APD   Right PERRL None   Left PERRL None         Visual Fields       Left Right    Full Full         Extraocular Movement       Right Left    Full Full         Neuro/Psych     Oriented x3: Yes   Mood/Affect: Normal         Dilation     Right eye: 2.5% Phenylephrine, 1.0% Mydriacyl @ 1:40 PM           Slit Lamp and Fundus Exam     External Exam       Right Left   External Normal Normal         Slit Lamp Exam       Right Left   Lids/Lashes Normal Normal   Conjunctiva/Sclera White and quiet White and quiet   Cornea Clear Clear   Anterior Chamber Deep and quiet Deep and quiet   Iris Round and reactive Round and reactive   Lens Posterior chamber intraocular lens Posterior chamber intraocular lens   Anterior Vitreous Normal Normal         Fundus Exam       Right Left   Posterior Vitreous Clear vitrectomized    Disc Normal    C/D Ratio 0.2    Macula Macular thickening, with shiny ILM, hard drusen, Retinal pigment epithelial rip.   Retinal pigment epithelial detachment, Intermediate age related macular degeneration, no hemorrhage    Vessels Normal    Periphery Normal              IMAGING AND PROCEDURES  Imaging and Procedures for 04/12/22  OCT, Retina - OU - Both Eyes       Right Eye Quality was good. Scan locations included subfoveal. Central Foveal Thickness: 400. Progression has been stable. Findings include no IRF, abnormal foveal contour, outer retinal tubulation, subretinal hyper-reflective material, choroidal neovascular membrane, disciform scar.   Left Eye Quality was good. Scan locations included subfoveal. Central Foveal Thickness: 311. Progression has been stable. Findings include normal foveal contour, no IRF, no SRF, retinal drusen .   Notes Recurrent CNVM now stable at 7-week macular findings and anatomy we will need to repeat injection today as macular anatomy has stabilized at 7-week follow-up.  Still active we will repeat injection of Eylea today OD and change again to 5-week follow-up consider change to Vabysmo     Intravitreal Injection, Pharmacologic Agent - OD - Right Eye       Time Out 04/12/2022. 1:49 PM. Confirmed correct patient, procedure, site, and patient consented.   Anesthesia Topical anesthesia was used. Anesthetic medications included Lidocaine 4%.   Procedure Preparation included 5% betadine to ocular surface, 10% betadine to eyelids, Tobramycin 0.3%. A 30 gauge needle was used.   Injection: 2 mg aflibercept 2 MG/0.05ML   Route: Intravitreal, Site: Right Eye   NDC: [id number], Lot: [id number], Expiration date: 01/03/2023, Waste: 0 mL   Post-op Post injection exam found visual acuity of at least counting fingers. The patient tolerated the procedure well. There were no complications. The patient received written and verbal post procedure care education. Post injection medications included ocuflox.              ASSESSMENT/PLAN:  Exudative age-related macular degeneration of right eye with active choroidal neovascularization (HCC) Persistent subretinal fluid remains on Eylea.  Currently at 7-week interval.   Repeat injection today  Consider change to Vabysmo next  Early stage nonexudative age-related macular degeneration of left eye No sign of CNVM OS by OCT today  Intermediate stage nonexudative age-related macular degeneration of left eye No sign of CNVM OS continue with oral vitamin therapy     ICD-10-CM   1. Exudative age-related macular degeneration of right eye with active choroidal neovascularization (HCC)  H35.3211 OCT, Retina - OU - Both Eyes    Intravitreal Injection, Pharmacologic Agent - OD - Right Eye    aflibercept (EYLEA) SOLN 2 mg    2. Early stage nonexudative age-related macular degeneration of left eye  H35.3121     3. Intermediate stage nonexudative age-related macular degeneration of left eye  H35.3122       1.  2.  3.  Ophthalmic Meds Ordered this visit:  Meds ordered this encounter  Medications   aflibercept (EYLEA) SOLN 2 mg       Return in about 5 weeks (around 05/17/2022) for dilate, OD, VABYSMO OCT,, this is a change.  There are no Patient Instructions on file for this visit.   Explained the diagnoses, plan, and follow up with the patient and they expressed understanding.  Patient expressed understanding of the importance of proper follow up care.   Clent Demark Graison Leinberger M.D. Diseases & Surgery of the Retina and Vitreous [facility] [place] 04/12/22     Abbreviations: M myopia (nearsighted); A astigmatism; H hyperopia (farsighted); P presbyopia; Mrx spectacle prescription;  CTL contact lenses; OD right eye; OS left eye; OU both eyes  XT exotropia; ET esotropia; PEK punctate epithelial keratitis; PEE punctate epithelial erosions; DES dry eye syndrome; MGD meibomian gland dysfunction; ATs artificial tears; PFAT's preservative free artificial tears; [place] nuclear sclerotic cataract; PSC posterior subcapsular cataract; ERM epi-retinal membrane; PVD posterior vitreous detachment; RD retinal detachment; DM diabetes mellitus; DR diabetic retinopathy;  NPDR non-proliferative diabetic retinopathy; PDR proliferative diabetic retinopathy; CSME clinically significant macular edema; DME diabetic macular edema; dbh dot blot hemorrhages; CWS cotton wool spot; POAG primary open angle glaucoma; C/D cup-to-disc ratio; HVF humphrey visual field; GVF goldmann visual field; OCT optical coherence tomography; IOP intraocular pressure; BRVO Branch retinal vein occlusion; CRVO central retinal vein occlusion; CRAO central retinal artery occlusion; BRAO branch retinal artery occlusion; RT retinal tear; SB scleral buckle; PPV pars plana vitrectomy; VH Vitreous hemorrhage; PRP panretinal laser photocoagulation; IVK intravitreal kenalog; VMT vitreomacular traction; MH Macular hole;  NVD neovascularization of the disc; NVE neovascularization elsewhere; AREDS age related eye disease study; ARMD age related macular degeneration; POAG primary open angle glaucoma; EBMD epithelial/anterior basement membrane dystrophy; ACIOL anterior chamber intraocular lens; IOL intraocular lens; PCIOL posterior chamber intraocular lens; Phaco/IOL phacoemulsification with intraocular lens placement; [place] photorefractive keratectomy; LASIK laser assisted in situ keratomileusis; HTN hypertension; DM diabetes mellitus; COPD chronic obstructive pulmonary disease

## 2022-04-13 ENCOUNTER — Encounter (INDEPENDENT_AMBULATORY_CARE_PROVIDER_SITE_OTHER): Payer: Self-pay | Admitting: Gastroenterology

## 2022-04-16 ENCOUNTER — Telehealth: Payer: Self-pay | Admitting: *Deleted

## 2022-04-16 MED ORDER — METOPROLOL TARTRATE 25 MG PO TABS: 25.0000 mg | ORAL_TABLET | Freq: Two times a day (BID) | ORAL | 6 refills | Status: DC

## 2022-04-16 NOTE — Telephone Encounter (Signed)
Laurine Blazer, LPN  3/49/6116  4:35 PM EDT Back to Top    Notified, copy to pcp.

## 2022-04-16 NOTE — Telephone Encounter (Signed)
-----   Message from Bernita Raisin, RN sent at 04/12/2022  9:18 AM EDT -----  ----- Message ----- From: Arnoldo Lenis, MD Sent: 04/11/2022  11:26 AM EDT To: Berlinda Last, CMA  Heart monitor does show some episodes of an abnormal heart rhythm called an SVT. This is not dangerous but can cause symptoms of heart racing, please start lopressor '25mg'$  bid to help prevent this rhythm  Zandra Abts MD

## 2022-04-22 ENCOUNTER — Telehealth: Payer: Self-pay | Admitting: Cardiology

## 2022-04-22 MED ORDER — METOPROLOL TARTRATE 25 MG PO TABS: 12.5000 mg | ORAL_TABLET | Freq: Two times a day (BID) | ORAL | Status: DC

## 2022-04-22 NOTE — Telephone Encounter (Signed)
Lower lopressor to 12.'5mg'$  bid and continue to monitor HRs and bp's, update [place] on symptoms next week  Zandra Abts MD

## 2022-04-22 NOTE — Telephone Encounter (Signed)
Patient notified and verbalized understanding. 

## 2022-04-22 NOTE — Telephone Encounter (Signed)
   Pt c/o medication issue:  1. Name of Medication: metoprolol tartrate (LOPRESSOR) 25 MG tablet  2. How are you currently taking this medication (dosage and times per day)? Take 1 tablet (25 mg total) by mouth 2 (two) times daily.  3. Are you having a reaction (difficulty breathing--STAT)? No   4. What is your medication issue? Pt said, her HR goes down to 40 when started taking this medication. She also feel weak and every time she try to stand up she get a little dizzy. She would like to ask Dr. Harl Bowie if she can take 1 table a day instead of 2x day

## 2022-04-22 NOTE — Telephone Encounter (Signed)
Patient recently started on Lopressor '25mg'$  twice a day for SVT on 04/16/2022.  States that she immediately felt better when medication slowed her heart rate down, but for about the last 2-3 days has noticed that she is feeling weakness and dizziness at times with standing.  Has not checked her BP at home, thinks she can though if her monitor is still working.  States she did finally check her heart rate today with pulse ox which was 40 - taken about 4 hours after her medication.

## 2022-05-04 ENCOUNTER — Encounter: Payer: Self-pay | Admitting: Cardiology

## 2022-05-07 NOTE — Telephone Encounter (Signed)
BP fine HR is low. Is she having any lightheadedness or dizziness? How often is she checking her HRs, how often are they in the 74s  Alexa Abts MD

## 2022-05-17 ENCOUNTER — Ambulatory Visit (INDEPENDENT_AMBULATORY_CARE_PROVIDER_SITE_OTHER): Payer: Medicare Other | Admitting: Ophthalmology

## 2022-05-17 ENCOUNTER — Encounter (INDEPENDENT_AMBULATORY_CARE_PROVIDER_SITE_OTHER): Payer: Self-pay | Admitting: Ophthalmology

## 2022-05-17 DIAGNOSIS — H353111 Nonexudative age-related macular degeneration, right eye, early dry stage: Secondary | ICD-10-CM

## 2022-05-17 DIAGNOSIS — H353121 Nonexudative age-related macular degeneration, left eye, early dry stage: Secondary | ICD-10-CM | POA: Diagnosis not present

## 2022-05-17 DIAGNOSIS — H353211 Exudative age-related macular degeneration, right eye, with active choroidal neovascularization: Secondary | ICD-10-CM

## 2022-05-17 MED ORDER — FARICIMAB-SVOA 6 MG/0.05ML IZ SOLN
6.0000 mg | INTRAVITREAL | Status: AC | PRN
  Administered 2022-05-17: 6 mg via INTRAVITREAL

## 2022-05-17 NOTE — Assessment & Plan Note (Signed)
No sign of CNVM OS by OCT today

## 2022-05-17 NOTE — Progress Notes (Signed)
05/17/2022     CHIEF COMPLAINT Patient presents for  Chief Complaint  Patient presents with   Macular Degeneration      HISTORY OF PRESENT ILLNESS: Alexa Nichols is a 76 y.o. female who presents to the clinic today for:   HPI   5 weeks for VABYSMO OCT, DILATE OD. Pt stated no major changes in vision since last visit.  Last edited by Silvestre Moment on 05/17/2022  1:38 PM.      Referring physician: [name], MD [place],  [place] [address]  HISTORICAL INFORMATION:   Selected notes from the MEDICAL RECORD NUMBER    Lab Results  Component Value Date   HGBA1C 4.6 (L) 02/17/2021     CURRENT MEDICATIONS: No current outpatient medications on file. (Ophthalmic Drugs)   No current facility-administered medications for this visit. (Ophthalmic Drugs)   Current Outpatient Medications (Other)  Medication Sig   acetaminophen (TYLENOL) 500 MG tablet Take 500 mg by mouth every 6 (six) hours as needed for moderate pain or headache.   alendronate (FOSAMAX) 70 MG tablet Take 70 mg by mouth once a week.   ALPRAZolam (XANAX) 0.5 MG tablet Take 0.25-0.5 mg by mouth daily as needed.   ferrous sulfate 325 (65 FE) MG tablet Take 1 tablet (325 mg total) by mouth in the morning and at bedtime.   Fluticasone-Umeclidin-Vilant (TRELEGY ELLIPTA) 200-62.5-25 MCG/ACT AEPB Inhale 1 puff into the lungs daily.   furosemide (LASIX) 20 MG tablet Take 1 tablet (20 mg total) by mouth as needed for edema (shorness of breath or swelling).   HYDROcodone-acetaminophen (NORCO/VICODIN) 5-325 MG tablet Take 1 tablet by mouth 2 (two) times daily as needed.   metoprolol tartrate (LOPRESSOR) 25 MG tablet Take 0.5 tablets (12.5 mg total) by mouth 2 (two) times daily.   montelukast (SINGULAIR) 10 MG tablet Take 10 mg by mouth at bedtime.   Multiple Vitamins-Minerals (PRESERVISION AREDS 2) CAPS Take 1 capsule by mouth every evening.   ondansetron (ZOFRAN) 4 MG tablet Take 1 tablet (4 mg total) by mouth every 6  (six) hours as needed for nausea.   pantoprazole (PROTONIX) 40 MG tablet Take 1 tablet (40 mg total) by mouth 2 (two) times daily.   sertraline (ZOLOFT) 25 MG tablet Take 25 mg by mouth daily.   simvastatin (ZOCOR) 20 MG tablet Take 20 mg by mouth daily.   vitamin B-12 (CYANOCOBALAMIN) 1000 MCG tablet Take 1 tablet (1,000 mcg total) by mouth daily.   No current facility-administered medications for this visit. (Other)      REVIEW OF SYSTEMS: ROS   Negative for: Constitutional, Gastrointestinal, Neurological, Skin, Genitourinary, Musculoskeletal, HENT, Endocrine, Cardiovascular, Eyes, Respiratory, Psychiatric, Allergic/Imm, Heme/Lymph Last edited by Silvestre Moment on 05/17/2022  1:39 PM.       ALLERGIES Allergies  Allergen Reactions   Aspirin     samter's syndrome   Codeine Itching   Sulfa Antibiotics     Unknown reaction   Other Rash    gel filled fentanyl patch, adhesive on that patch caused ithcing    PAST MEDICAL HISTORY Past Medical History:  Diagnosis Date   Asthma    Chronic low back pain    COPD (chronic obstructive pulmonary disease) (HCC)    Depression    GERD (gastroesophageal reflux disease)    Guillain Barr syndrome (HCC)    Heart murmur    Lobular carcinoma of left breast ([facility]) 1997   in situ   Other and unspecified hyperlipidemia  Seasonal allergies    Past Surgical History:  Procedure Laterality Date   BIOPSY  12/03/2020   Procedure: BIOPSY;  Surgeon: Rogene Houston, MD;  [facility]: [facility];  Service: Endoscopy;;  gastric polyps biopsies;   BREAST SURGERY Left    lumpectomy    CESAREAN SECTION  1986   CHOLECYSTECTOMY     COLONOSCOPY WITH PROPOFOL N/A 12/03/2020    two 4-7 mm polyps in cecum, one small polyp in cecum, one 5 mm polyp, sigmoid diverticulosis, path with tubular adenomas   ENTEROSCOPY N/A 02/02/2022   Procedure: ENTEROSCOPY;  Surgeon: [name], MD;  Location: [facility];  Service: Gastroenterology;  Laterality: N/A;    ESOPHAGOGASTRODUODENOSCOPY (EGD) WITH PROPOFOL N/A 12/03/2020   normal esophagus, multiple gastric polyps s/p biopsy, small paraesophageal hernia, normal duodenum. Fundic gland polyps.   ESOPHAGOGASTRODUODENOSCOPY (EGD) WITH PROPOFOL N/A 02/05/2021   Procedure: ESOPHAGOGASTRODUODENOSCOPY (EGD) WITH PROPOFOL;  Surgeon: [name], MD;  [facility]: [facility];  Service: Endoscopy;  Laterality: N/A;   ESOPHAGOGASTRODUODENOSCOPY (EGD) WITH PROPOFOL N/A 03/03/2021   Procedure: ESOPHAGOGASTRODUODENOSCOPY (EGD) WITH PROPOFOL;  Surgeon: [name], MD;  [facility]: [facility];  Service: Gastroenterology;  Laterality: N/A;  with possible push enteroscopy utilizing the pediatric colonoscope   ESOPHAGOGASTRODUODENOSCOPY (EGD) WITH PROPOFOL N/A 02/02/2022   Procedure: ESOPHAGOGASTRODUODENOSCOPY (EGD) WITH PROPOFOL;  Surgeon: [name], MD;  [facility]: [facility];  Service: Gastroenterology;  Laterality: N/A;   GIVENS CAPSULE STUDY N/A 12/16/2020   normal small bowel capsule but rapid transit time of 21 minutes. Recommended to resume alendronate.    GIVENS CAPSULE STUDY N/A 02/04/2021   Procedure: GIVENS CAPSULE STUDY;  Surgeon: Rogene Houston, MD;  [facility];  Service: Endoscopy;  Laterality: N/A;   HOT HEMOSTASIS  02/05/2021   Procedure: HOT HEMOSTASIS (ARGON PLASMA COAGULATION/BICAP);  Surgeon: [name], MD;  [facility]: [facility];  Service: Endoscopy;;   HOT HEMOSTASIS  03/03/2021   Procedure: HOT HEMOSTASIS (ARGON PLASMA COAGULATION/BICAP);  Surgeon: [name], [name], MD;  [facility]: [facility];  Service: Gastroenterology;;   HOT HEMOSTASIS  02/02/2022   Procedure: HOT HEMOSTASIS (ARGON PLASMA COAGULATION/BICAP);  Surgeon: [name], [name], MD;  [facility]: [facility];  Service: Gastroenterology;;   TONSILLECTOMY     age 81    FAMILY HISTORY Family History  Problem Relation Age of Onset   Osteoporosis Mother    Other Father         head trauma   Breast cancer Sister        X's 2 sisters    SOCIAL HISTORY Social History   Tobacco Use   Smoking status: Former    Packs/day: 0.50    Years: 20.00    Total pack years: 10.00    Types: Cigarettes    Start date: 09/16/1964    Quit date: 10/05/1983    Years since quitting: 38.6   Smokeless tobacco: Never  Vaping Use   Vaping Use: Never used  Substance Use Topics   Alcohol use: Never    Alcohol/week: 0.0 standard drinks of alcohol   Drug use: Never         OPHTHALMIC EXAM:  Base Eye Exam     Visual Acuity (ETDRS)       Right Left   Dist cc 20/150 20/20   Dist ph cc NI     Correction: Glasses         Tonometry (Tonopen, 1:43 PM)  Right Left   Pressure 11 13         Pupils       Pupils APD   Right PERRL None   Left PERRL None         Visual Fields       Left Right    Full Full         Extraocular Movement       Right Left    Full, Ortho Full, Ortho         Neuro/Psych     Oriented x3: Yes   Mood/Affect: Normal         Dilation     Right eye: 2.5% Phenylephrine, 1.0% Mydriacyl @ 1:43 PM           Slit Lamp and Fundus Exam     External Exam       Right Left   External Normal Normal         Slit Lamp Exam       Right Left   Lids/Lashes Normal Normal   Conjunctiva/Sclera White and quiet White and quiet   Cornea Clear Clear   Anterior Chamber Deep and quiet Deep and quiet   Iris Round and reactive Round and reactive   Lens Posterior chamber intraocular lens Posterior chamber intraocular lens   Anterior Vitreous Normal Normal         Fundus Exam       Right Left   Posterior Vitreous Clear vitrectomized    Disc Normal    C/D Ratio 0.2    Macula Macular thickening, with shiny ILM, hard drusen, Retinal pigment epithelial rip.   Retinal pigment epithelial detachment, Intermediate age related macular degeneration, no hemorrhage    Vessels Normal    Periphery Normal              IMAGING AND PROCEDURES  Imaging and Procedures for 05/17/22  OCT, Retina - OU - Both Eyes       Right Eye Quality was good. Scan locations included subfoveal. Central Foveal Thickness: 366. Progression has been stable. Findings include no IRF, abnormal foveal contour, outer retinal tubulation, subretinal hyper-reflective material, choroidal neovascular membrane, disciform scar.   Left Eye Quality was good. Scan locations included subfoveal. Central Foveal Thickness: 308. Progression has been stable. Findings include normal foveal contour, no IRF, no SRF, retinal drusen .   Notes Recurrent CNVM again improved with less subretinal fluid,  at  5-week follow-up will change to Vabysmo today so as to lengthen the interval follow-up without recurrences as was occurring on Eylea      Intravitreal Injection, Pharmacologic Agent - OD - Right Eye       Time Out 05/17/2022. 2:04 PM. Confirmed correct patient, procedure, site, and patient consented.   Anesthesia Topical anesthesia was used. Anesthetic medications included Lidocaine 4%.   Procedure Preparation included 5% betadine to ocular surface, 10% betadine to eyelids, Tobramycin 0.3%. A 30 gauge needle was used.   Injection: 6 mg faricimab-svoa 6 MG/0.05ML   Route: Intravitreal, Site: Right Eye   NDC: [phone], Lot: [id number], Expiration date: 02/02/2024, Waste: 0 mL   Post-op Post injection exam found visual acuity of at least counting fingers. The patient tolerated the procedure well. There were no complications. The patient received written and verbal post procedure care education. Post injection medications included ocuflox.              ASSESSMENT/PLAN:  Early stage nonexudative age-related macular degeneration of right eye Chronic  active subfoveal, recurrences on CNVM therapy with Eylea in the past at 7-week intervals.,  Still sensitive at 5-week intervals.  We will change to Vabysmo today and attempt to treat with  longer intervals in the future  Early stage nonexudative age-related macular degeneration of left eye No sign of CNVM OS by OCT today     ICD-10-CM   1. Exudative age-related macular degeneration of right eye with active choroidal neovascularization (HCC)  H35.3211 OCT, Retina - OU - Both Eyes    Intravitreal Injection, Pharmacologic Agent - OD - Right Eye    faricimab-svoa (VABYSMO) '6mg'$ /0.55m intravitreal injection    2. Early stage nonexudative age-related macular degeneration of right eye  H35.3111     3. Early stage nonexudative age-related macular degeneration of left eye  H35.3121       1.  Change from Eylea to Vabysmo today.  Eylea is effective but only at shorter intervals of 1 month to 5 weeks.  Recurrences in 6 to 7 weeks of CNVM OD.  Change Vabysmo OD today  2.  No sign of CNVM OS 3.  Ophthalmic Meds Ordered this visit:  Meds ordered this encounter  Medications   faricimab-svoa (VABYSMO) '6mg'$ /0.062mintravitreal injection       Return in about 5 weeks (around 06/21/2022) for dilate, OD, VABYSMO OCT.  There are no Patient Instructions on file for this visit.   Explained the diagnoses, plan, and follow up with the patient and they expressed understanding.  Patient expressed understanding of the importance of proper follow up care.   GaClent Demarkankin M.D. Diseases & Surgery of the Retina and Vitreous [facility]ina [facility] Diabetic EyPort Royal8/14/23     Abbreviations: M myopia (nearsighted); A astigmatism; H hyperopia (farsighted); P presbyopia; Mrx spectacle prescription;  CTL contact lenses; OD right eye; OS left eye; OU both eyes  XT exotropia; ET esotropia; PEK punctate epithelial keratitis; PEE punctate epithelial erosions; DES dry eye syndrome; MGD meibomian gland dysfunction; ATs artificial tears; PFAT's preservative free artificial tears; NSBoonuclear sclerotic cataract; PSC posterior subcapsular cataract; ERM epi-retinal membrane; PVD posterior vitreous detachment; RD  retinal detachment; DM diabetes mellitus; DR diabetic retinopathy; NPDR non-proliferative diabetic retinopathy; PDR proliferative diabetic retinopathy; CSME clinically significant macular edema; DME diabetic macular edema; dbh dot blot hemorrhages; CWS cotton wool spot; POAG primary open angle glaucoma; C/D cup-to-disc ratio; HVF humphrey visual field; GVF goldmann visual field; OCT optical coherence tomography; IOP intraocular pressure; BRVO Branch retinal vein occlusion; CRVO central retinal vein occlusion; CRAO central retinal artery occlusion; BRAO branch retinal artery occlusion; RT retinal tear; SB scleral buckle; PPV pars plana vitrectomy; VH Vitreous hemorrhage; PRP panretinal laser photocoagulation; IVK intravitreal kenalog; VMT vitreomacular traction; MH Macular hole;  NVD neovascularization of the disc; NVE neovascularization elsewhere; AREDS age related eye disease study; ARMD age related macular degeneration; POAG primary open angle glaucoma; EBMD epithelial/anterior basement membrane dystrophy; ACIOL anterior chamber intraocular lens; IOL intraocular lens; PCIOL posterior chamber intraocular lens; Phaco/IOL phacoemulsification with intraocular lens placement; PREustishotorefractive keratectomy; LASIK laser assisted in situ keratomileusis; HTN hypertension; DM diabetes mellitus; COPD chronic obstructive pulmonary disease

## 2022-05-17 NOTE — Assessment & Plan Note (Signed)
Chronic active subfoveal, recurrences on CNVM therapy with Eylea in the past at 7-week intervals.,  Still sensitive at 5-week intervals.  We will change to Vabysmo today and attempt to treat with longer intervals in the future

## 2022-05-27 ENCOUNTER — Ambulatory Visit (INDEPENDENT_AMBULATORY_CARE_PROVIDER_SITE_OTHER): Payer: Medicare Other | Admitting: Gastroenterology

## 2022-06-01 ENCOUNTER — Encounter (INDEPENDENT_AMBULATORY_CARE_PROVIDER_SITE_OTHER): Payer: Self-pay | Admitting: Gastroenterology

## 2022-06-01 ENCOUNTER — Ambulatory Visit (INDEPENDENT_AMBULATORY_CARE_PROVIDER_SITE_OTHER): Payer: Medicare Other | Admitting: Gastroenterology

## 2022-06-01 VITALS — BP 123/77 | HR 52 | Temp 98.8°F | Ht <= 58 in | Wt 136.5 lb

## 2022-06-01 DIAGNOSIS — K5520 Angiodysplasia of colon without hemorrhage: Secondary | ICD-10-CM | POA: Diagnosis not present

## 2022-06-01 DIAGNOSIS — K31819 Angiodysplasia of stomach and duodenum without bleeding: Secondary | ICD-10-CM

## 2022-06-01 NOTE — Patient Instructions (Addendum)
It was nice to see you and I am glad you are doing well! Please continue protonix twice a day and iron pills twice a day as well Please make [place] aware of new or worsening GI symptoms  Follow up 6 months

## 2022-06-01 NOTE — Progress Notes (Signed)
Referring Provider: [name], MD Primary Care Physician:  [name], MD Primary GI Physician: Jenetta Downer  Chief Complaint  Patient presents with   Follow-up    Patient here today for a follow up appointment. Patient reports she has some mid to right sided abdominal pain and nausea occasionally. She does not take any medication for this. Jerrye Bushy is controlled with once a day pantoprazole even thought prescribed for bid.   HPI:   Alexa Nichols is a 76 y.o. female with past medical history of [name], recurrent iron deficiency anemia due to AVMs, COPD, asthma, depression, GERD, carcinoma of the breast  Patient presenting today for follow up.  Last seen 02/25/22   History: hospitalization in May for recurrent hematemesis, EGD on 02/02/2022 which showed presence of 6 mm bleeding Mallory-Weiss linear tear with stigmata of recent bleeding was found (adhered clot) - there was evidence of ongoing oozing after removing the clot..  For hemostasis, one hemostatic clip was successfully placed. Hematin was found in the entire examined stomach. A single 4 mm angiodysplastic lesion with stigmata of recent bleeding was found on the greater curvature of the stomach, this was treated with APC.  Duodenum was normal.  Patient was given the hospital with PPI twice daily and her diet could be advanced, she was eventually discharged from the hospital.   Returned to hospital 2 days after leaving as she was feeling very fatigued and having chest tightness.  Hemoglobin was above 8 and was stable compared to prior.  She underwent a TTE and was found diastolic dysfunction with possible HOCM changes. She was seen by Dr. Harl Bowie recently and had a ZIO monitor placed last Monday.   Having intermittent episodes of short-span abdominal pain in her upper abdomen, which she described as stinging pain. It usually lasts for a minute but it goes away on its own.   Maintained on iron PO and pantoprazole 40 mg twice a  day.  last labs with hgb 11.5, plt 246 on 02/25/22   Present:  Today she is Doing well overall. She denies any rectal bleeding, having darker stools on her iron pills. She states that she continues to have some stinging pain in her epigastric region that occurs maybe 1-2x/week. No precipitating factors, symptoms are mild and usually resolve on its own. No vomiting. Appetite is good. No weight loss. She has some occasional nausea that she takes a zofran for with good results. She is having no heartburn or acid regurgitation.  She states she as having some SVT that she was started on metoprolol by Dr. Harl Bowie for. She feels some fatigue from this but overall feels better as she is not having palpitations.   No red flag symptoms. Patient denies melena, hematochezia, vomiting, diarrhea, constipation, dysphagia, odyonophagia, early satiety or weight loss.   Last Colonoscopy:12/03/2020, 2 polyps resected from the cecum 3 polyps resected from the cecum with largest size of 7 mm, there was a polyp of 5 mm in the hepatic flexure, diverticulosis and external hemorrhoids.  Pathology of the polyps was consistent with a tubular adenoma.  She was recommended to have a repeat colonoscopy in 5 years  Last Endoscopy:EGD on 02/02/2022 which showed presence of 6 mm bleeding Mallory-Weiss linear tear with stigmata of recent bleeding was found (adhered clot) - there was evidence of ongoing oozing after removing the clot..  For hemostasis, one hemostatic clip was successfully placed. Hematin was found in the entire examined stomach. A single 4 mm angiodysplastic  lesion with stigmata of recent bleeding was found on the greater curvature of the stomach, this was treated with APC.  Duodenum was normal.     Past Medical History:  Diagnosis Date   Asthma    Chronic low back pain    COPD (chronic obstructive pulmonary disease) (HCC)    Depression    GERD (gastroesophageal reflux disease)    Guillain Barr syndrome (HCC)     Heart murmur    Lobular carcinoma of left breast ([place]) 1997   in situ   Other and unspecified hyperlipidemia    Seasonal allergies     Past Surgical History:  Procedure Laterality Date   BIOPSY  12/03/2020   Procedure: BIOPSY;  Surgeon: Rogene Houston, MD;  [facility];  Service: Endoscopy;;  gastric polyps biopsies;   BREAST SURGERY Left    lumpectomy    [place]     COLONOSCOPY WITH PROPOFOL N/A 12/03/2020    two 4-7 mm polyps in cecum, one small polyp in cecum, one 5 mm polyp, sigmoid diverticulosis, path with tubular adenomas   ENTEROSCOPY N/A 02/02/2022   Procedure: ENTEROSCOPY;  Surgeon: [name], MD;  [facility];  Service: Gastroenterology;  Laterality: N/A;   ESOPHAGOGASTRODUODENOSCOPY (EGD) WITH PROPOFOL N/A 12/03/2020   normal esophagus, multiple gastric polyps s/p biopsy, small paraesophageal hernia, normal duodenum. Fundic gland polyps.   ESOPHAGOGASTRODUODENOSCOPY (EGD) WITH PROPOFOL N/A 02/05/2021   Procedure: ESOPHAGOGASTRODUODENOSCOPY (EGD) WITH PROPOFOL;  Surgeon: [name], MD;  [facility];  Service: Endoscopy;  Laterality: N/A;   ESOPHAGOGASTRODUODENOSCOPY (EGD) WITH PROPOFOL N/A 03/03/2021   Procedure: ESOPHAGOGASTRODUODENOSCOPY (EGD) WITH PROPOFOL;  Surgeon: [name], MD;  [facility];  Service: Gastroenterology;  Laterality: N/A;  with possible push enteroscopy utilizing the pediatric colonoscope   ESOPHAGOGASTRODUODENOSCOPY (EGD) WITH PROPOFOL N/A 02/02/2022   Procedure: ESOPHAGOGASTRODUODENOSCOPY (EGD) WITH PROPOFOL;  Surgeon: [name], MD;  [facility];  Service: Gastroenterology;  Laterality: N/A;   GIVENS CAPSULE STUDY N/A 12/16/2020   normal small bowel capsule but rapid transit time of 21 minutes. Recommended to resume alendronate.    GIVENS CAPSULE STUDY N/A 02/04/2021   Procedure: GIVENS CAPSULE STUDY;  Surgeon: Rogene Houston, MD;  [facility];  Service: Endoscopy;  Laterality: N/A;   HOT HEMOSTASIS  02/05/2021   Procedure: HOT HEMOSTASIS (ARGON PLASMA COAGULATION/BICAP);  Surgeon: [name], MD;  [facility];  Service: Endoscopy;;   HOT HEMOSTASIS  03/03/2021   Procedure: HOT HEMOSTASIS (ARGON PLASMA COAGULATION/BICAP);  Surgeon: [name], [name], MD;  [facility];  Service: Gastroenterology;;   HOT HEMOSTASIS  02/02/2022   Procedure: HOT HEMOSTASIS (ARGON PLASMA COAGULATION/BICAP);  Surgeon: [name], [name], MD;  [facility];  Service: Gastroenterology;;   TONSILLECTOMY     age 89    Current Outpatient Medications  Medication Sig Dispense Refill   acetaminophen (TYLENOL) 500 MG tablet Take 500 mg by mouth every 6 (six) hours as needed for moderate pain or headache.     alendronate (FOSAMAX) 70 MG tablet Take 70 mg by mouth once a week.     ALPRAZolam (XANAX) 0.5 MG tablet Take 0.25-0.5 mg by mouth daily as needed.     ferrous sulfate 325 (65 FE) MG tablet Take 1 tablet (325 mg total) by mouth in the morning and at bedtime. 180 tablet 3   Fluticasone-Umeclidin-Vilant (TRELEGY ELLIPTA) 200-62.5-25 MCG/ACT  AEPB Inhale 1 puff into the lungs daily. 28 each 3   furosemide (LASIX) 20 MG tablet Take 1 tablet (20 mg total) by mouth as needed for edema (shorness of breath or swelling).     HYDROcodone-acetaminophen (NORCO/VICODIN) 5-325 MG tablet Take 1 tablet by mouth 2 (two) times daily as needed.     metoprolol tartrate (LOPRESSOR) 25 MG tablet Take 0.5 tablets (12.5 mg total) by mouth 2 (two) times daily.     montelukast (SINGULAIR) 10 MG tablet Take 10 mg by mouth at bedtime.     Multiple Vitamins-Minerals (PRESERVISION AREDS 2) CAPS Take 1 capsule by mouth every evening.     ondansetron (ZOFRAN) 4 MG tablet Take 1 tablet (4 mg total) by mouth every 6 (six) hours as needed for nausea. 20 tablet 0   pantoprazole (PROTONIX) 40 MG tablet Take 1  tablet (40 mg total) by mouth 2 (two) times daily. 60 tablet 3   sertraline (ZOLOFT) 25 MG tablet Take 25 mg by mouth daily.     simvastatin (ZOCOR) 20 MG tablet Take 20 mg by mouth daily.     vitamin B-12 (CYANOCOBALAMIN) 1000 MCG tablet Take 1 tablet (1,000 mcg total) by mouth daily. 30 tablet 3   No current facility-administered medications for this visit.    Allergies as of 06/01/2022 - Review Complete 06/01/2022  Allergen Reaction Noted   Aspirin  02/14/2020   Codeine Itching 02/14/2020   Sulfa antibiotics  02/14/2020   Other Rash 01/14/2014    Family History  Problem Relation Age of Onset   Osteoporosis Mother    Other Father        head trauma   Breast cancer Sister        X's 2 sisters    Social History   Socioeconomic History   Marital status: Married    Spouse name: Not on file   Number of children: Not on file   Years of education: Not on file   Highest education level: Not on file  Occupational History   Not on file  Tobacco Use   Smoking status: Former    Packs/day: 0.50    Years: 20.00    Total pack years: 10.00    Types: Cigarettes    Start date: 09/16/1964    Quit date: 10/05/1983    Years since quitting: 38.6   Smokeless tobacco: Never  Vaping Use   Vaping Use: Never used  Substance and Sexual Activity   Alcohol use: Never    Alcohol/week: 0.0 standard drinks of alcohol   Drug use: Never   Sexual activity: Not on file  Other Topics Concern   Not on file  Social History Narrative   Not on file   Social Determinants of Health   Financial Resource Strain: Not on file  Food Insecurity: Not on file  Transportation Needs: Not on file  Physical Activity: Not on file  Stress: Not on file  Social Connections: Not on file    Review of systems General: negative for malaise, night sweats, fever, chills, weight loss Neck: Negative for lumps, goiter, pain and significant neck swelling Resp: Negative for cough, wheezing, dyspnea at rest CV:  Negative for chest pain, leg swelling, palpitations, orthopnea GI: denies melena, hematochezia, nausea, vomiting, diarrhea, constipation, dysphagia, odyonophagia, early satiety or unintentional weight loss. +occasional burning abdominal pain  MSK: Negative for joint pain or swelling, back pain, and muscle pain. Derm: Negative for itching or rash Psych: Denies depression, anxiety, memory loss, confusion. No  homicidal or suicidal ideation.  Heme: Negative for prolonged bleeding, bruising easily, and swollen nodes. Endocrine: Negative for cold or heat intolerance, polyuria, polydipsia and goiter. Neuro: negative for tremor, gait imbalance, syncope and seizures. The remainder of the review of systems is noncontributory.  Physical Exam: BP 123/77 (BP Location: Left Arm, Patient Position: Sitting, Cuff Size: Large)   Pulse (!) 52   Temp 98.8 F (37.1 C) (Oral)   Ht '4\' 10"'$  (1.473 m)   Wt 136 lb 8 oz (61.9 kg)   BMI 28.53 kg/m  General:   Alert and oriented. No distress noted. Pleasant and cooperative.  Head:  Normocephalic and atraumatic. Eyes:  Conjuctiva clear without scleral icterus. Mouth:  Oral mucosa pink and moist. Good dentition. No lesions. Heart: Normal rate and rhythm, s1 and s2 heart sounds present.  Lungs: Clear lung sounds in all lobes. Respirations equal and unlabored. Abdomen:  +BS, soft, non-tender and non-distended. No rebound or guarding. No HSM or masses noted. Derm: No palmar erythema or jaundice Msk:  Symmetrical without gross deformities. Normal posture. Extremities:  Without edema. Neurologic:  Alert and  oriented x4 Psych:  Alert and cooperative. Normal mood and affect.  Invalid input(s): "6 MONTHS"   ASSESSMENT: Alexa Nichols is a 76 y.o. female presenting today for follow up.  Significant history of SB AVMs and AVMs of stomach as well as previous mallory weiss tear, all resulting in symptomatic anemia, last hospitalized in May. Doing well since then,  maintained on oral Iron and pantoprazole BID. She has no heartburn or acid regurgitation. Has occasional burning abdominal pain but this is mild and resolves on its own. No red flag symptoms. Patient denies melena, hematochezia, vomiting, diarrhea, constipation, dysphagia, odyonophagia, early satiety or weight loss. Overall she feels she is doing well.    PLAN:  Continue PPI BID, consider decreasing to daily at next visit  2. Continue iron pills  3. Pt to make me aware of new or worsening GI symptoms  All questions were answered, patient verbalized understanding and is in agreement with plan as outlined above.   Follow Up: 6 months  Vaudine Dutan L. Alver Sorrow, MSN, APRN, AGNP-C Adult-Gerontology Nurse Practitioner [facility]

## 2022-06-03 ENCOUNTER — Encounter: Payer: Self-pay | Admitting: Cardiology

## 2022-06-03 ENCOUNTER — Ambulatory Visit: Payer: Medicare Other | Attending: Cardiology | Admitting: Cardiology

## 2022-06-03 VITALS — BP 136/70 | HR 44 | Ht <= 58 in | Wt 136.8 lb

## 2022-06-03 DIAGNOSIS — I495 Sick sinus syndrome: Secondary | ICD-10-CM | POA: Diagnosis not present

## 2022-06-03 DIAGNOSIS — R002 Palpitations: Secondary | ICD-10-CM | POA: Diagnosis not present

## 2022-06-03 DIAGNOSIS — I5032 Chronic diastolic (congestive) heart failure: Secondary | ICD-10-CM | POA: Diagnosis not present

## 2022-06-03 DIAGNOSIS — I471 Supraventricular tachycardia: Secondary | ICD-10-CM | POA: Diagnosis not present

## 2022-06-03 NOTE — Progress Notes (Signed)
Clinical Summary Alexa Nichols is a 76 y.o.female seen today for follow up of the following medical problems.   1. Chronic diastolic HF - admit 0/9604 few days after discharge for GI bleed with SOB, volume overload - thought secondary to pRBCs, IVFs from prior admission - 02/2022 echo LVEF 65-70%, grade II dd, normal RV   - home weights daily, usually 136-139 lbs - mild orthostatic symptoms   - no SOB/DOE,no LE edema. Has lasix just prn.      2. LVOT dynamic gradient -turbulent flow in LVOT noted on echo with SAM, symmetrical wall thicknessat 1.3 cm by my measure. I believe the turbulent LVOT flow is due to symmetric moderate hypertrophy with hyperdynamic LV. Would not pursue further imaging at this time    3. Palpitations/PSVT - occurs daily, occurs at rest. Feeling of heart fluttering, lasts a few seconds - drinks 1 cup of coffee. Decaf tea only, no sodas. No EtOH, no energy drinsk - ongoing for a we months.   03/2022 6 day monitor: rare PACs, 27 runs SVT longest 14 sec. Rare PVCs  - started on lopressor '25mg'$  bid, low HRs lowered to 12.'5mg'$  bid. Still ongoing bradycardia - occasional lightheadedness, no dizziness. Stopped metoprolol and had some recurrent symptoms.    4. Chronic LBBB     5. Asthma       6.History of GI bleed - admit 02/2022 with GI bleed due to [employer] tear - EGD on 02/02/2022 showed Mallory-Weiss tear, clip placed, Hematin in entire stomach, single, recently bleeding angiodysplastic lesion in stomach, s/p argon beam coagulation - received 2 units pRBCs - noted AVMs stomach, small bowel     7. Chronic SOB Past Medical History:  Diagnosis Date   Asthma    Chronic low back pain    COPD (chronic obstructive pulmonary disease) (HCC)    Depression    GERD (gastroesophageal reflux disease)    Guillain Barr syndrome (HCC)    Heart murmur    Lobular carcinoma of left breast ([place]) 1997   in situ   Other and unspecified hyperlipidemia     Seasonal allergies      Allergies  Allergen Reactions   Aspirin     samter's syndrome   Codeine Itching   Sulfa Antibiotics     Unknown reaction   Other Rash    gel filled fentanyl patch, adhesive on that patch caused ithcing     Current Outpatient Medications  Medication Sig Dispense Refill   acetaminophen (TYLENOL) 500 MG tablet Take 500 mg by mouth every 6 (six) hours as needed for moderate pain or headache.     alendronate (FOSAMAX) 70 MG tablet Take 70 mg by mouth once a week.     ALPRAZolam (XANAX) 0.5 MG tablet Take 0.25-0.5 mg by mouth daily as needed.     ferrous sulfate 325 (65 FE) MG tablet Take 1 tablet (325 mg total) by mouth in the morning and at bedtime. 180 tablet 3   Fluticasone-Umeclidin-Vilant (TRELEGY ELLIPTA) 200-62.5-25 MCG/ACT AEPB Inhale 1 puff into the lungs daily. 28 each 3   furosemide (LASIX) 20 MG tablet Take 1 tablet (20 mg total) by mouth as needed for edema (shorness of breath or swelling).     HYDROcodone-acetaminophen (NORCO/VICODIN) 5-325 MG tablet Take 1 tablet by mouth 2 (two) times daily as needed.     metoprolol tartrate (LOPRESSOR) 25 MG tablet Take 0.5 tablets (12.5 mg total) by mouth 2 (two) times daily.  montelukast (SINGULAIR) 10 MG tablet Take 10 mg by mouth at bedtime.     Multiple Vitamins-Minerals (PRESERVISION AREDS 2) CAPS Take 1 capsule by mouth every evening.     ondansetron (ZOFRAN) 4 MG tablet Take 1 tablet (4 mg total) by mouth every 6 (six) hours as needed for nausea. 20 tablet 0   pantoprazole (PROTONIX) 40 MG tablet Take 1 tablet (40 mg total) by mouth 2 (two) times daily. 60 tablet 3   sertraline (ZOLOFT) 25 MG tablet Take 25 mg by mouth daily.     simvastatin (ZOCOR) 20 MG tablet Take 20 mg by mouth daily.     vitamin B-12 (CYANOCOBALAMIN) 1000 MCG tablet Take 1 tablet (1,000 mcg total) by mouth daily. 30 tablet 3   No current facility-administered medications for this visit.     Past Surgical History:  Procedure  Laterality Date   BIOPSY  12/03/2020   Procedure: BIOPSY;  Surgeon: Rogene Houston, MD;  [facility];  Service: Endoscopy;;  gastric polyps biopsies;   BREAST SURGERY Left    lumpectomy    CESAREAN SECTION  1986   CHOLECYSTECTOMY     COLONOSCOPY WITH PROPOFOL N/A 12/03/2020    two 4-7 mm polyps in cecum, one small polyp in cecum, one 5 mm polyp, sigmoid diverticulosis, path with tubular adenomas   ENTEROSCOPY N/A 02/02/2022   Procedure: ENTEROSCOPY;  Surgeon: [name], MD;  [facility];  Service: Gastroenterology;  Laterality: N/A;   ESOPHAGOGASTRODUODENOSCOPY (EGD) WITH PROPOFOL N/A 12/03/2020   normal esophagus, multiple gastric polyps s/p biopsy, small paraesophageal hernia, normal duodenum. Fundic gland polyps.   ESOPHAGOGASTRODUODENOSCOPY (EGD) WITH PROPOFOL N/A 02/05/2021   Procedure: ESOPHAGOGASTRODUODENOSCOPY (EGD) WITH PROPOFOL;  Surgeon: [name], MD;  [facility];  Service: Endoscopy;  Laterality: N/A;   ESOPHAGOGASTRODUODENOSCOPY (EGD) WITH PROPOFOL N/A 03/03/2021   Procedure: ESOPHAGOGASTRODUODENOSCOPY (EGD) WITH PROPOFOL;  Surgeon: [name], MD;  [facility];  Service: Gastroenterology;  Laterality: N/A;  with possible push enteroscopy utilizing the pediatric colonoscope   ESOPHAGOGASTRODUODENOSCOPY (EGD) WITH PROPOFOL N/A 02/02/2022   Procedure: ESOPHAGOGASTRODUODENOSCOPY (EGD) WITH PROPOFOL;  Surgeon: [name], MD;  [facility];  Service: Gastroenterology;  Laterality: N/A;   GIVENS CAPSULE STUDY N/A 12/16/2020   normal small bowel capsule but rapid transit time of 21 minutes. Recommended to resume alendronate.    GIVENS CAPSULE STUDY N/A 02/04/2021   Procedure: GIVENS CAPSULE STUDY;  Surgeon: Rogene Houston, MD;  [facility];  Service: Endoscopy;  Laterality: N/A;   HOT HEMOSTASIS  02/05/2021   Procedure: HOT HEMOSTASIS (ARGON PLASMA COAGULATION/BICAP);   Surgeon: [name], MD;  [facility];  Service: Endoscopy;;   HOT HEMOSTASIS  03/03/2021   Procedure: HOT HEMOSTASIS (ARGON PLASMA COAGULATION/BICAP);  Surgeon: [name], [name], MD;  [facility];  Service: Gastroenterology;;   HOT HEMOSTASIS  02/02/2022   Procedure: HOT HEMOSTASIS (ARGON PLASMA COAGULATION/BICAP);  Surgeon: [name], [name], MD;  [facility];  Service: Gastroenterology;;   TONSILLECTOMY     age 70     Allergies  Allergen Reactions   Aspirin     samter's syndrome   Codeine Itching   Sulfa Antibiotics     Unknown reaction   Other Rash    gel filled fentanyl patch, adhesive on that patch caused ithcing      Family History  Problem Relation Age of Onset   Osteoporosis Mother  Other Father        head trauma   Breast cancer Sister        X's 2 sisters     Social History Alexa Nichols reports that she quit smoking about 38 years ago. Her smoking use included cigarettes. She started smoking about 57 years ago. She has a 10.00 pack-year smoking history. She has never used smokeless tobacco. Alexa Nichols reports no history of alcohol use.   Review of Systems CONSTITUTIONAL: No weight loss, fever, chills, weakness or fatigue.  HEENT: Eyes: No visual loss, blurred vision, double vision or yellow sclerae.No hearing loss, sneezing, congestion, runny nose or sore throat.  SKIN: No rash or itching.  CARDIOVASCULAR: per hpi RESPIRATORY: No shortness of breath, cough or sputum.  GASTROINTESTINAL: No anorexia, nausea, vomiting or diarrhea. No abdominal pain or blood.  GENITOURINARY: No burning on urination, no polyuria NEUROLOGICAL: No headache, dizziness, syncope, paralysis, ataxia, numbness or tingling in the extremities. No change in bowel or bladder control.  MUSCULOSKELETAL: No muscle, back pain, joint pain or stiffness.  LYMPHATICS: No enlarged nodes. No history of splenectomy.  PSYCHIATRIC: No history of  depression or anxiety.  ENDOCRINOLOGIC: No reports of sweating, cold or heat intolerance. No polyuria or polydipsia.  Marland Kitchen   Physical Examination Today's Vitals   06/03/22 1029  BP: 136/70  Pulse: (!) 44  SpO2: 95%  Weight: 136 lb 12.8 oz (62.1 kg)  Height: '4\' 10"'$  (1.473 m)   Body mass index is 28.59 kg/m.  Gen: resting comfortably, no acute distress HEENT: no scleral icterus, pupils equal round and reactive, no palptable cervical adenopathy,  CV: RRR, no m/r/g, no jvd Resp: Clear to auscultation bilaterally GI: abdomen is soft, non-tender, non-distended, normal bowel sounds, no hepatosplenomegaly MSK: extremities are warm, no edema.  Skin: warm, no rash Neuro:  no focal deficits Psych: appropriate affect   Diagnostic Studies 12/31/13 Echo LVEF 38-93%, normal diastolic function, mild LAE, mild to mod MR, mild TR   01/2014 stress echo Impressions:  - Normal study after maximal exercise. [facility] treadmill score   is 7 consistent with low risk for major cardiac events.   Very good functional capacity (140% of age and gender   predicted.   Clinic EKG reviewed, LBBB   04/01/16 Clinic EKG: NSR   03/2022 monitor  6 day monitor Rare supraventricular ectopy in the form of isolated PACs, couplets, triplets. 27 runs of SVT longest 13.6 seconds Rare ventricular ectopy in the form of isolated PVCs. Symptoms correlated with sinus rhythm and runs of SVT  Assessment and Plan   1. PSVT/Tachy brady syndrome - recent palpitations, monitor with PACs and episodes of SVT - started lopressor, low HRs even on 12.'5mg'$  bid though palpitations are improved - today EKG sinus brady 41 with chronic LBBB.  - refer to EP for evalaution given symptomatic PSVT, brady on low dose av nodal agents. Take lopressor just prn for the time being.    2.Chronic diastolic HF - euvolemic today, continue prn lasix.      Arnoldo Lenis, M.D.

## 2022-06-03 NOTE — Patient Instructions (Signed)
Medication Instructions:  Continue all current medications.  Labwork: none  Testing/Procedures: none  Follow-Up: 6 months   Any Other Special Instructions Will Be Listed Below (If Applicable). You have been referred to EP    If you need a refill on your cardiac medications before your next appointment, please call your pharmacy.

## 2022-06-24 ENCOUNTER — Encounter (INDEPENDENT_AMBULATORY_CARE_PROVIDER_SITE_OTHER): Payer: Self-pay | Admitting: Ophthalmology

## 2022-06-24 ENCOUNTER — Ambulatory Visit (INDEPENDENT_AMBULATORY_CARE_PROVIDER_SITE_OTHER): Payer: Medicare Other | Admitting: Ophthalmology

## 2022-06-24 DIAGNOSIS — H353121 Nonexudative age-related macular degeneration, left eye, early dry stage: Secondary | ICD-10-CM

## 2022-06-24 DIAGNOSIS — H353211 Exudative age-related macular degeneration, right eye, with active choroidal neovascularization: Secondary | ICD-10-CM | POA: Diagnosis not present

## 2022-06-24 MED ORDER — FARICIMAB-SVOA 6 MG/0.05ML IZ SOLN
6.0000 mg | INTRAVITREAL | Status: AC | PRN
  Administered 2022-06-24: 6 mg via INTRAVITREAL

## 2022-06-24 NOTE — Progress Notes (Signed)
06/24/2022     CHIEF COMPLAINT Patient presents for  Chief Complaint  Patient presents with   Macular Degeneration      HISTORY OF PRESENT ILLNESS: Alexa Nichols is a 76 y.o. female who presents to the clinic today for:   HPI   5 weeks for DILATE OD, VABYSMO OCT. Pt stated vision has slightly worsened. Pt reports vision is blurry only in OD throughout the day.  Last edited by Silvestre Moment on 06/24/2022 12:57 PM.      Referring physician: [name], MD [place],  [place] [address]  HISTORICAL INFORMATION:   Selected notes from the MEDICAL RECORD NUMBER    Lab Results  Component Value Date   HGBA1C 4.6 (L) 02/17/2021     CURRENT MEDICATIONS: No current outpatient medications on file. (Ophthalmic Drugs)   No current facility-administered medications for this visit. (Ophthalmic Drugs)   Current Outpatient Medications (Other)  Medication Sig   acetaminophen (TYLENOL) 500 MG tablet Take 500 mg by mouth every 6 (six) hours as needed for moderate pain or headache.   alendronate (FOSAMAX) 70 MG tablet Take 70 mg by mouth once a week.   ALPRAZolam (XANAX) 0.5 MG tablet Take 0.25-0.5 mg by mouth daily as needed.   ferrous sulfate 325 (65 FE) MG tablet Take 1 tablet (325 mg total) by mouth in the morning and at bedtime.   Fluticasone-Umeclidin-Vilant (TRELEGY ELLIPTA) 200-62.5-25 MCG/ACT AEPB Inhale 1 puff into the lungs daily.   furosemide (LASIX) 20 MG tablet Take 1 tablet (20 mg total) by mouth as needed for edema (shorness of breath or swelling). (Patient taking differently: Take 20 mg by mouth daily as needed for edema (shorness of breath or swelling).)   HYDROcodone-acetaminophen (NORCO/VICODIN) 5-325 MG tablet Take 1 tablet by mouth 2 (two) times daily as needed.   metoprolol tartrate (LOPRESSOR) 25 MG tablet Take 0.5 tablets (12.5 mg total) by mouth 2 (two) times daily.   montelukast (SINGULAIR) 10 MG tablet Take 10 mg by mouth at bedtime.   Multiple  Vitamins-Minerals (PRESERVISION AREDS 2) CAPS Take 1 capsule by mouth every evening.   ondansetron (ZOFRAN) 4 MG tablet Take 1 tablet (4 mg total) by mouth every 6 (six) hours as needed for nausea.   pantoprazole (PROTONIX) 40 MG tablet Take 1 tablet (40 mg total) by mouth 2 (two) times daily.   sertraline (ZOLOFT) 25 MG tablet Take 25 mg by mouth daily.   simvastatin (ZOCOR) 20 MG tablet Take 20 mg by mouth daily.   vitamin B-12 (CYANOCOBALAMIN) 1000 MCG tablet Take 1 tablet (1,000 mcg total) by mouth daily.   No current facility-administered medications for this visit. (Other)      REVIEW OF SYSTEMS: ROS   Negative for: Constitutional, Gastrointestinal, Neurological, Skin, Genitourinary, Musculoskeletal, HENT, Endocrine, Cardiovascular, Eyes, Respiratory, Psychiatric, Allergic/Imm, Heme/Lymph Last edited by Silvestre Moment on 06/24/2022 12:57 PM.       ALLERGIES Allergies  Allergen Reactions   Aspirin     samter's syndrome   Codeine Itching   Sulfa Antibiotics     Unknown reaction   Other Rash    gel filled fentanyl patch, adhesive on that patch caused ithcing    PAST MEDICAL HISTORY Past Medical History:  Diagnosis Date   Asthma    Chronic low back pain    COPD (chronic obstructive pulmonary disease) (HCC)    Depression    GERD (gastroesophageal reflux disease)    Guillain Barr syndrome ([place])  Heart murmur    Lobular carcinoma of left breast ([place]) 1997   in situ   Other and unspecified hyperlipidemia    Seasonal allergies    Past Surgical History:  Procedure Laterality Date   BIOPSY  12/03/2020   Procedure: BIOPSY;  Surgeon: Rogene Houston, MD;  [facility];  Service: Endoscopy;;  gastric polyps biopsies;   BREAST SURGERY Left    lumpectomy    CESAREAN SECTION  1986   CHOLECYSTECTOMY     COLONOSCOPY WITH PROPOFOL N/A 12/03/2020    two 4-7 mm polyps in cecum, one small polyp in cecum, one 5 mm polyp, sigmoid diverticulosis, path with tubular adenomas    ENTEROSCOPY N/A 02/02/2022   Procedure: ENTEROSCOPY;  Surgeon: [name], MD;  [facility];  Service: Gastroenterology;  Laterality: N/A;   ESOPHAGOGASTRODUODENOSCOPY (EGD) WITH PROPOFOL N/A 12/03/2020   normal esophagus, multiple gastric polyps s/p biopsy, small paraesophageal hernia, normal duodenum. Fundic gland polyps.   ESOPHAGOGASTRODUODENOSCOPY (EGD) WITH PROPOFOL N/A 02/05/2021   Procedure: ESOPHAGOGASTRODUODENOSCOPY (EGD) WITH PROPOFOL;  Surgeon: [name], MD;  [facility];  Service: Endoscopy;  Laterality: N/A;   ESOPHAGOGASTRODUODENOSCOPY (EGD) WITH PROPOFOL N/A 03/03/2021   Procedure: ESOPHAGOGASTRODUODENOSCOPY (EGD) WITH PROPOFOL;  Surgeon: [name], MD;  [facility];  Service: Gastroenterology;  Laterality: N/A;  with possible push enteroscopy utilizing the pediatric colonoscope   ESOPHAGOGASTRODUODENOSCOPY (EGD) WITH PROPOFOL N/A 02/02/2022   Procedure: ESOPHAGOGASTRODUODENOSCOPY (EGD) WITH PROPOFOL;  Surgeon: [name], MD;  [facility];  Service: Gastroenterology;  Laterality: N/A;   GIVENS CAPSULE STUDY N/A 12/16/2020   normal small bowel capsule but rapid transit time of 21 minutes. Recommended to resume alendronate.    GIVENS CAPSULE STUDY N/A 02/04/2021   Procedure: GIVENS CAPSULE STUDY;  Surgeon: Rogene Houston, MD;  [facility];  Service: Endoscopy;  Laterality: N/A;   HOT HEMOSTASIS  02/05/2021   Procedure: HOT HEMOSTASIS (ARGON PLASMA COAGULATION/BICAP);  Surgeon: [name], MD;  [facility];  Service: Endoscopy;;   HOT HEMOSTASIS  03/03/2021   Procedure: HOT HEMOSTASIS (ARGON PLASMA COAGULATION/BICAP);  Surgeon: [name], [name], MD;  [facility];  Service: Gastroenterology;;   HOT HEMOSTASIS  02/02/2022   Procedure: HOT HEMOSTASIS (ARGON PLASMA COAGULATION/BICAP);  Surgeon: [name], [name], MD;  [facility];   Service: Gastroenterology;;   TONSILLECTOMY     age 77    FAMILY HISTORY Family History  Problem Relation Age of Onset   Osteoporosis Mother    Other Father        head trauma   Breast cancer Sister        X's 2 sisters    SOCIAL HISTORY Social History   Tobacco Use   Smoking status: Former    Packs/day: 0.50    Years: 20.00    Total pack years: 10.00    Types: Cigarettes    Start date: 09/16/1964    Quit date: 10/05/1983    Years since quitting: 38.7   Smokeless tobacco: Never  Vaping Use   Vaping Use: Never used  Substance Use Topics   Alcohol use: Never    Alcohol/week: 0.0 standard drinks of alcohol   Drug use: Never         OPHTHALMIC EXAM:  Base Eye Exam     Visual Acuity (ETDRS)       Right Left   Dist cc 20/400 20/20   Dist  ph cc 20/200 -1     Correction: Glasses         Tonometry (Tonopen, 1:01 PM)       Right Left   Pressure 14 12         Pupils       Pupils APD   Right PERRL None   Left PERRL None         Visual Fields       Left Right    Full Full         Extraocular Movement       Right Left    Full, Ortho Full, Ortho         Neuro/Psych     Oriented x3: Yes   Mood/Affect: Normal         Dilation     Right eye: 2.5% Phenylephrine, 1.0% Mydriacyl @ 1:01 PM           Slit Lamp and Fundus Exam     External Exam       Right Left   External Normal Normal         Slit Lamp Exam       Right Left   Lids/Lashes Normal Normal   Conjunctiva/Sclera White and quiet White and quiet   Cornea Clear Clear   Anterior Chamber Deep and quiet Deep and quiet   Iris Round and reactive Round and reactive   Lens Posterior chamber intraocular lens Posterior chamber intraocular lens   Anterior Vitreous Normal Normal         Fundus Exam       Right Left   Posterior Vitreous Clear vitrectomized    Disc Normal    C/D Ratio 0.2    Macula Macular thickening, with shiny ILM, hard drusen, Retinal pigment  epithelial rip.   Retinal pigment epithelial detachment, Intermediate age related macular degeneration, no hemorrhage    Vessels Normal    Periphery Normal             IMAGING AND PROCEDURES  Imaging and Procedures for 06/24/22  OCT, Retina - OU - Both Eyes       Right Eye Quality was good. Scan locations included subfoveal. Central Foveal Thickness: 377. Progression has been stable. Findings include no IRF, abnormal foveal contour, outer retinal tubulation, subretinal hyper-reflective material, choroidal neovascular membrane, disciform scar.   Left Eye Quality was good. Scan locations included subfoveal. Central Foveal Thickness: 309. Progression has been stable. Findings include normal foveal contour, no IRF, no SRF, retinal drusen .   Notes Recurrent CNVM again improved with less subretinal fluid,  at  5-week follow-up will maintain use of Vabysmo today so as to lengthen the interval follow-up without recurrences as was occurring on Eylea     Intravitreal Injection, Pharmacologic Agent - OD - Right Eye       Time Out 06/24/2022. 1:29 PM. Confirmed correct patient, procedure, site, and patient consented.   Anesthesia Topical anesthesia was used. Anesthetic medications included Lidocaine 4%.   Procedure Preparation included 5% betadine to ocular surface, 10% betadine to eyelids, Tobramycin 0.3%. A 30 gauge needle was used.   Injection: 6 mg faricimab-svoa 6 MG/0.05ML   Route: Intravitreal, Site: Right Eye   NDC: [phone], Lot: [id number], Expiration date: 03/04/2024, Waste: 0 mL   Post-op Post injection exam found visual acuity of at least counting fingers. The patient tolerated the procedure well. There were no complications. The patient received written and verbal post procedure care education. Post injection  medications included ocuflox.              ASSESSMENT/PLAN:  Exudative age-related macular degeneration of right eye with active choroidal  neovascularization (HCC) OD, became resistant to [facility] use.  Post Vabysmo No. 1 stable lesion and much less subretinal fluid.  Repeat injection today  Early stage nonexudative age-related macular degeneration of left eye Stable OS     ICD-10-CM   1. Exudative age-related macular degeneration of right eye with active choroidal neovascularization (HCC)  H35.3211 OCT, Retina - OU - Both Eyes    Intravitreal Injection, Pharmacologic Agent - OD - Right Eye    faricimab-svoa (VABYSMO) '6mg'$ /0.61m intravitreal injection    2. Early stage nonexudative age-related macular degeneration of left eye  H35.3121       1.  Overall stable acuity but nonetheless patient is having depth perception issues.  This is a good sign that potentially there is enough vision in the right eye to cause depth perception issues.  I I did remind the patient that most depth perception issues are dependent on only having vision in 1 eye  2.  3.  Ophthalmic Meds Ordered this visit:  Meds ordered this encounter  Medications   faricimab-svoa (VABYSMO) '6mg'$ /0.051mintravitreal injection       Return in about 6 weeks (around 08/05/2022) for dilate, OD, VABYSMO OCT.  Patient Instructions  Patient understands there is a change in contracting upcoming but Medicare participation should be in place with upon her next visit in 6 weeks  Patient should call 1 week prior to confirm appointment   Explained the diagnoses, plan, and follow up with the patient and they expressed understanding.  Patient expressed understanding of the importance of proper follow up care.   GaClent Demarkankin M.D. Diseases & Surgery of the Retina and Vitreous Retina & Diabetic EyTeresita9/21/23     Abbreviations: M myopia (nearsighted); A astigmatism; H hyperopia (farsighted); P presbyopia; Mrx spectacle prescription;  CTL contact lenses; OD right eye; OS left eye; OU both eyes  XT exotropia; ET esotropia; PEK punctate epithelial keratitis; PEE  punctate epithelial erosions; DES dry eye syndrome; MGD meibomian gland dysfunction; ATs artificial tears; PFAT's preservative free artificial tears; [place] sclerotic cataract; PSC posterior subcapsular cataract; ERM epi-retinal membrane; PVD posterior vitreous detachment; RD retinal detachment; DM diabetes mellitus; DR diabetic retinopathy; NPDR non-proliferative diabetic retinopathy; PDR proliferative diabetic retinopathy; CSME clinically significant macular edema; DME diabetic macular edema; dbh dot blot hemorrhages; CWS cotton wool spot; POAG primary open angle glaucoma; C/D cup-to-disc ratio; HVF humphrey visual field; GVF goldmann visual field; OCT optical coherence tomography; IOP intraocular pressure; BRVO Branch retinal vein occlusion; CRVO central retinal vein occlusion; CRAO central retinal artery occlusion; BRAO branch retinal artery occlusion; RT retinal tear; SB scleral buckle; PPV pars plana vitrectomy; VH Vitreous hemorrhage; PRP panretinal laser photocoagulation; IVK intravitreal kenalog; VMT vitreomacular traction; MH Macular hole;  NVD neovascularization of the disc; NVE neovascularization elsewhere; AREDS age related eye disease study; ARMD age related macular degeneration; POAG primary open angle glaucoma; EBMD epithelial/anterior basement membrane dystrophy; ACIOL anterior chamber intraocular lens; IOL intraocular lens; PCIOL posterior chamber intraocular lens; Phaco/IOL phacoemulsification with intraocular lens placement; [place]fractive keratectomy; LASIK laser assisted in situ keratomileusis; HTN hypertension; DM diabetes mellitus; COPD chronic obstructive pulmonary disease

## 2022-06-24 NOTE — Patient Instructions (Signed)
Patient understands there is a change in contracting upcoming but Medicare participation should be in place with upon her next visit in 6 weeks  Patient should call 1 week prior to confirm appointment

## 2022-06-24 NOTE — Assessment & Plan Note (Signed)
Stable OS 

## 2022-06-24 NOTE — Assessment & Plan Note (Signed)
OD, became resistant to [facility] use.  Post Vabysmo No. 1 stable lesion and much less subretinal fluid.  Repeat injection today

## 2022-07-07 NOTE — Progress Notes (Unsigned)
Cardiology Office Note:    Date:  07/08/2022   ID:  Alexa Nichols, DOB 1946/02/17, MRN [id number]  PCP:  [name], MD   [place] Providers Cardiologist:  None     Referring MD: Arnoldo Lenis, MD   Chief complaint: palpitations  History of Present Illness:    Alexa Nichols is a 76 y.o. female with a hx of CHFpEF, left bundle branch block, LVOT dynamic gradient with SAM, COPD, lobular cancer of left breast, history of GI bleed, Guillain-Barr referred for arrhythmia management.  She has been having episodic palpitations.  A monitor was placed that showed runs of SVT.  The longest was 14 seconds. R-R intervals are regular, and the ventricular rate is typically about 130 beats a minute  She has not had any chest pain, syncope, presyncope, edema.  Her primary complaint is fatigue which is constant and unrelated to her episodes of palpitations.   Past Medical History:  Diagnosis Date   Asthma    Chronic low back pain    COPD (chronic obstructive pulmonary disease) (HCC)    Depression    GERD (gastroesophageal reflux disease)    Guillain Barr syndrome (HCC)    Heart murmur    Lobular carcinoma of left breast ([place]) 1997   in situ   Other and unspecified hyperlipidemia    Seasonal allergies     Past Surgical History:  Procedure Laterality Date   BIOPSY  12/03/2020   Procedure: BIOPSY;  Surgeon: Rogene Houston, MD;  [facility];  Service: Endoscopy;;  gastric polyps biopsies;   BREAST SURGERY Left    lumpectomy    [place]     COLONOSCOPY WITH PROPOFOL N/A 12/03/2020    two 4-7 mm polyps in cecum, one small polyp in cecum, one 5 mm polyp, sigmoid diverticulosis, path with tubular adenomas   ENTEROSCOPY N/A 02/02/2022   Procedure: ENTEROSCOPY;  Surgeon: [name], MD;  [facility];  Service: Gastroenterology;  Laterality: N/A;   ESOPHAGOGASTRODUODENOSCOPY (EGD) WITH PROPOFOL N/A  12/03/2020   normal esophagus, multiple gastric polyps s/p biopsy, small paraesophageal hernia, normal duodenum. Fundic gland polyps.   ESOPHAGOGASTRODUODENOSCOPY (EGD) WITH PROPOFOL N/A 02/05/2021   Procedure: ESOPHAGOGASTRODUODENOSCOPY (EGD) WITH PROPOFOL;  Surgeon: [name], MD;  [facility];  Service: Endoscopy;  Laterality: N/A;   ESOPHAGOGASTRODUODENOSCOPY (EGD) WITH PROPOFOL N/A 03/03/2021   Procedure: ESOPHAGOGASTRODUODENOSCOPY (EGD) WITH PROPOFOL;  Surgeon: [name], MD;  [facility];  Service: Gastroenterology;  Laterality: N/A;  with possible push enteroscopy utilizing the pediatric colonoscope   ESOPHAGOGASTRODUODENOSCOPY (EGD) WITH PROPOFOL N/A 02/02/2022   Procedure: ESOPHAGOGASTRODUODENOSCOPY (EGD) WITH PROPOFOL;  Surgeon: [name], MD;  [facility];  Service: Gastroenterology;  Laterality: N/A;   GIVENS CAPSULE STUDY N/A 12/16/2020   normal small bowel capsule but rapid transit time of 21 minutes. Recommended to resume alendronate.    GIVENS CAPSULE STUDY N/A 02/04/2021   Procedure: GIVENS CAPSULE STUDY;  Surgeon: Rogene Houston, MD;  [facility];  Service: Endoscopy;  Laterality: N/A;   HOT HEMOSTASIS  02/05/2021   Procedure: HOT HEMOSTASIS (ARGON PLASMA COAGULATION/BICAP);  Surgeon: [name], MD;  [facility];  Service: Endoscopy;;   HOT HEMOSTASIS  03/03/2021   Procedure: HOT HEMOSTASIS (ARGON PLASMA COAGULATION/BICAP);  Surgeon: [name], [name], MD;  [facility];  Service: Gastroenterology;;   HOT HEMOSTASIS  02/02/2022  Procedure: HOT HEMOSTASIS (ARGON PLASMA COAGULATION/BICAP);  Surgeon: [name], [name], MD;  [facility];  Service: Gastroenterology;;   TONSILLECTOMY     age 79    Current Medications: Current Meds  Medication Sig   acetaminophen (TYLENOL) 500 MG tablet Take 500 mg by mouth every 6 (six) hours as needed for moderate  pain or headache.   alendronate (FOSAMAX) 70 MG tablet Take 70 mg by mouth once a week.   ALPRAZolam (XANAX) 0.5 MG tablet Take 0.25-0.5 mg by mouth daily as needed.   ferrous sulfate 325 (65 FE) MG tablet Take 1 tablet (325 mg total) by mouth in the morning and at bedtime.   Fluticasone-Umeclidin-Vilant (TRELEGY ELLIPTA) 200-62.5-25 MCG/ACT AEPB Inhale 1 puff into the lungs daily.   furosemide (LASIX) 20 MG tablet Take 1 tablet (20 mg total) by mouth as needed for edema (shorness of breath or swelling). (Patient taking differently: Take 20 mg by mouth daily as needed for edema (shorness of breath or swelling).)   HYDROcodone-acetaminophen (NORCO/VICODIN) 5-325 MG tablet Take 1 tablet by mouth 2 (two) times daily as needed.   metoprolol tartrate (LOPRESSOR) 25 MG tablet Take 0.5 tablets (12.5 mg total) by mouth 2 (two) times daily.   montelukast (SINGULAIR) 10 MG tablet Take 10 mg by mouth at bedtime.   Multiple Vitamins-Minerals (PRESERVISION AREDS 2) CAPS Take 1 capsule by mouth every evening.   ondansetron (ZOFRAN) 4 MG tablet Take 1 tablet (4 mg total) by mouth every 6 (six) hours as needed for nausea.   pantoprazole (PROTONIX) 40 MG tablet Take 1 tablet (40 mg total) by mouth 2 (two) times daily.   sertraline (ZOLOFT) 25 MG tablet Take 25 mg by mouth daily.   simvastatin (ZOCOR) 20 MG tablet Take 20 mg by mouth daily.   vitamin B-12 (CYANOCOBALAMIN) 1000 MCG tablet Take 1 tablet (1,000 mcg total) by mouth daily.     Allergies:   Aspirin, Codeine, Sulfa antibiotics, and Other   Social History   Socioeconomic History   Marital status: Married    Spouse name: Not on file   Number of children: Not on file   Years of education: Not on file   Highest education level: Not on file  Occupational History   Not on file  Tobacco Use   Smoking status: Former    Packs/day: 0.50    Years: 20.00    Total pack years: 10.00    Types: Cigarettes    Start date: 09/16/1964    Quit date: 10/05/1983     Years since quitting: 38.7   Smokeless tobacco: Never  Vaping Use   Vaping Use: Never used  Substance and Sexual Activity   Alcohol use: Never    Alcohol/week: 0.0 standard drinks of alcohol   Drug use: Never   Sexual activity: Not on file  Other Topics Concern   Not on file  Social History Narrative   Not on file   Social Determinants of Health   Financial Resource Strain: Not on file  Food Insecurity: Not on file  Transportation Needs: Not on file  Physical Activity: Not on file  Stress: Not on file  Social Connections: Not on file     Family History: The patient's family history includes Breast cancer in her sister; Osteoporosis in her mother; Other in her father.  ROS:   Please see the history of present illness.    All other systems reviewed and are negative.  EKGs/Labs/Other Studies Reviewed:  Monitor 03/2022 6 days Patch Wear Time:  5 days and 23 hours (2023-05-22T18:11:38-0400 to 2023-05-28T17:17:17-0400)   Patient had a min HR of 49 bpm, max HR of 193 bpm, and avg HR of 69 bpm.  Predominant underlying rhythm was Sinus Rhythm. Bundle Branch Block/IVCD was present.  27 Supraventricular Tachycardia runs occurred, the run with the fastest interval lasting 7  beats with a max rate of 193 bpm, the longest lasting 13.6 secs with an avg rate of 111 bpm.  Supraventricular Tachycardia was detected within +/- 45 seconds of symptomatic patient event(s).  Isolated SVEs were rare (<1.0%), SVE Couplets were rare (<1.0%),  and SVE Triplets were rare (<1.0%). Isolated VEs were rare (<1.0%), and no VE Couplets or VE Triplets were present.  EKG:  Last EKG results: sinus rhythm Left bundle branch block   Recent Labs: 02/02/2022: ALT 14 02/04/2022: B Natriuretic Peptide 201.0; Magnesium 2.2 02/05/2022: BUN 8; Creatinine, Ser 0.32; Potassium 4.1; Sodium 140 02/25/2022: Hemoglobin 11.5; Platelets 246     Physical Exam:    VS:  BP 138/68   Pulse 68   Ht '4\' 10"'$  (1.473 m)    Wt 136 lb (61.7 kg)   SpO2 96%   BMI 28.42 kg/m     Wt Readings from Last 3 Encounters:  07/08/22 136 lb (61.7 kg)  06/03/22 136 lb 12.8 oz (62.1 kg)  06/01/22 136 lb 8 oz (61.9 kg)     GEN:  Well nourished, well developed in no acute distress CARDIAC: RRR, no murmurs, rubs, gallops RESPIRATORY:  Normal work of breathing MUSCULOSKELETAL:  edema    ASSESSMENT & PLAN:    Paroxysmal SVT: most consistent with atrial runs. These have been very brief. Because metoprolol did help but increased her fatigue, we will try a low dose of diltiazem. I don't think we would have much success trying to induce and ablate the focus, and the risk would likely outweigh the benefit at this point. If she begins to have longer episodes or sustains arrhythmia, we will readdress. Possible HOCM: LVH and SAM noted on most recent TTE but the gradient was not adequately measured. Rate lowering medications may be of some benefit if she tolerates them.           Medication Adjustments/Labs and Tests Ordered: Current medicines are reviewed at length with the patient today.  Concerns regarding medicines are outlined above.  Orders Placed This Encounter  Procedures   EKG 12-Lead   No orders of the defined types were placed in this encounter.    Signed, Melida Quitter, MD  07/08/2022 1:21 PM    [place]

## 2022-07-08 ENCOUNTER — Ambulatory Visit: Payer: Medicare Other | Attending: Cardiovascular Disease | Admitting: Cardiovascular Disease

## 2022-07-08 ENCOUNTER — Encounter: Payer: Self-pay | Admitting: Cardiovascular Disease

## 2022-07-08 VITALS — BP 138/68 | HR 68 | Ht <= 58 in | Wt 136.0 lb

## 2022-07-08 DIAGNOSIS — I5032 Chronic diastolic (congestive) heart failure: Secondary | ICD-10-CM | POA: Diagnosis not present

## 2022-07-08 DIAGNOSIS — I447 Left bundle-branch block, unspecified: Secondary | ICD-10-CM | POA: Diagnosis not present

## 2022-07-08 MED ORDER — DILTIAZEM HCL ER COATED BEADS 120 MG PO CP24: 120.0000 mg | ORAL_CAPSULE | Freq: Every day | ORAL | 3 refills | Status: DC

## 2022-07-08 NOTE — Patient Instructions (Addendum)
Medication Instructions:  Your physician has recommended you make the following change in your medication:  1) STOP taking Lopressor (metoprolol tartrate)  2) START taking Cardizem (diltiazem) 120 mg daily   *If you need a refill on your cardiac medications before your next appointment, please call your pharmacy   Follow-Up: At [facility], you and your health needs are our priority.  As part of our continuing mission to provide you with exceptional heart care, we have created designated Provider Care Teams.  These Care Teams include your primary Cardiologist (physician) and Advanced Practice Providers (APPs -  Physician Assistants and Nurse Practitioners) who all work together to provide you with the care you need, when you need it.  Your next appointment:   1 year(s)  The format for your next appointment:   In Person  Provider:   You may see Doralee Albino, MD or one of the following Advanced Practice Providers on your designated Care Team:   Tommye Standard, [place] Legrand Como "[facility]" [place], [place]

## 2022-08-05 ENCOUNTER — Encounter (INDEPENDENT_AMBULATORY_CARE_PROVIDER_SITE_OTHER): Payer: Medicare Other | Admitting: Ophthalmology

## 2022-08-09 ENCOUNTER — Encounter (INDEPENDENT_AMBULATORY_CARE_PROVIDER_SITE_OTHER): Payer: Medicare Other | Admitting: Ophthalmology

## 2022-08-09 DIAGNOSIS — H353111 Nonexudative age-related macular degeneration, right eye, early dry stage: Secondary | ICD-10-CM | POA: Diagnosis not present

## 2022-08-09 DIAGNOSIS — H353122 Nonexudative age-related macular degeneration, left eye, intermediate dry stage: Secondary | ICD-10-CM | POA: Diagnosis not present

## 2022-08-09 DIAGNOSIS — H353211 Exudative age-related macular degeneration, right eye, with active choroidal neovascularization: Secondary | ICD-10-CM | POA: Diagnosis not present

## 2022-08-25 DIAGNOSIS — F419 Anxiety disorder, unspecified: Secondary | ICD-10-CM | POA: Diagnosis not present

## 2022-08-25 DIAGNOSIS — M4804 Spinal stenosis, thoracic region: Secondary | ICD-10-CM | POA: Diagnosis not present

## 2022-08-25 DIAGNOSIS — Z299 Encounter for prophylactic measures, unspecified: Secondary | ICD-10-CM | POA: Diagnosis not present

## 2022-08-25 DIAGNOSIS — I10 Essential (primary) hypertension: Secondary | ICD-10-CM | POA: Diagnosis not present

## 2022-08-25 DIAGNOSIS — M546 Pain in thoracic spine: Secondary | ICD-10-CM | POA: Diagnosis not present

## 2022-08-25 DIAGNOSIS — M5134 Other intervertebral disc degeneration, thoracic region: Secondary | ICD-10-CM | POA: Diagnosis not present

## 2022-08-25 DIAGNOSIS — M47814 Spondylosis without myelopathy or radiculopathy, thoracic region: Secondary | ICD-10-CM | POA: Diagnosis not present

## 2022-08-25 DIAGNOSIS — J069 Acute upper respiratory infection, unspecified: Secondary | ICD-10-CM | POA: Diagnosis not present

## 2022-09-20 DIAGNOSIS — J069 Acute upper respiratory infection, unspecified: Secondary | ICD-10-CM | POA: Diagnosis not present

## 2022-09-20 DIAGNOSIS — I509 Heart failure, unspecified: Secondary | ICD-10-CM | POA: Diagnosis not present

## 2022-09-20 DIAGNOSIS — J449 Chronic obstructive pulmonary disease, unspecified: Secondary | ICD-10-CM | POA: Diagnosis not present

## 2022-09-20 DIAGNOSIS — I700 Atherosclerosis of aorta: Secondary | ICD-10-CM | POA: Diagnosis not present

## 2022-09-28 DIAGNOSIS — H353111 Nonexudative age-related macular degeneration, right eye, early dry stage: Secondary | ICD-10-CM | POA: Diagnosis not present

## 2022-09-28 DIAGNOSIS — H353211 Exudative age-related macular degeneration, right eye, with active choroidal neovascularization: Secondary | ICD-10-CM | POA: Diagnosis not present

## 2022-09-28 DIAGNOSIS — H353122 Nonexudative age-related macular degeneration, left eye, intermediate dry stage: Secondary | ICD-10-CM | POA: Diagnosis not present

## 2022-10-20 DIAGNOSIS — D485 Neoplasm of uncertain behavior of skin: Secondary | ICD-10-CM | POA: Diagnosis not present

## 2022-10-20 DIAGNOSIS — L720 Epidermal cyst: Secondary | ICD-10-CM | POA: Diagnosis not present

## 2022-10-26 DIAGNOSIS — Z9889 Other specified postprocedural states: Secondary | ICD-10-CM | POA: Diagnosis not present

## 2022-10-26 DIAGNOSIS — Z961 Presence of intraocular lens: Secondary | ICD-10-CM | POA: Diagnosis not present

## 2022-10-26 DIAGNOSIS — H35351 Cystoid macular degeneration, right eye: Secondary | ICD-10-CM | POA: Diagnosis not present

## 2022-10-26 DIAGNOSIS — H353211 Exudative age-related macular degeneration, right eye, with active choroidal neovascularization: Secondary | ICD-10-CM | POA: Diagnosis not present

## 2022-11-23 DIAGNOSIS — H353122 Nonexudative age-related macular degeneration, left eye, intermediate dry stage: Secondary | ICD-10-CM | POA: Diagnosis not present

## 2022-11-23 DIAGNOSIS — H353114 Nonexudative age-related macular degeneration, right eye, advanced atrophic with subfoveal involvement: Secondary | ICD-10-CM | POA: Diagnosis not present

## 2022-11-23 DIAGNOSIS — H353211 Exudative age-related macular degeneration, right eye, with active choroidal neovascularization: Secondary | ICD-10-CM | POA: Diagnosis not present

## 2022-11-29 ENCOUNTER — Ambulatory Visit (INDEPENDENT_AMBULATORY_CARE_PROVIDER_SITE_OTHER): Payer: Medicare Other | Admitting: Gastroenterology

## 2022-11-29 ENCOUNTER — Encounter (INDEPENDENT_AMBULATORY_CARE_PROVIDER_SITE_OTHER): Payer: Self-pay | Admitting: Gastroenterology

## 2022-11-29 VITALS — BP 124/72 | HR 47 | Temp 97.8°F | Ht <= 58 in | Wt 134.7 lb

## 2022-11-29 DIAGNOSIS — R110 Nausea: Secondary | ICD-10-CM | POA: Diagnosis not present

## 2022-11-29 DIAGNOSIS — G610 Guillain-Barre syndrome: Secondary | ICD-10-CM | POA: Diagnosis not present

## 2022-11-29 DIAGNOSIS — E7800 Pure hypercholesterolemia, unspecified: Secondary | ICD-10-CM | POA: Diagnosis not present

## 2022-11-29 DIAGNOSIS — K5520 Angiodysplasia of colon without hemorrhage: Secondary | ICD-10-CM

## 2022-11-29 DIAGNOSIS — R5383 Other fatigue: Secondary | ICD-10-CM | POA: Diagnosis not present

## 2022-11-29 DIAGNOSIS — J069 Acute upper respiratory infection, unspecified: Secondary | ICD-10-CM | POA: Diagnosis not present

## 2022-11-29 DIAGNOSIS — Z299 Encounter for prophylactic measures, unspecified: Secondary | ICD-10-CM | POA: Diagnosis not present

## 2022-11-29 DIAGNOSIS — Z7189 Other specified counseling: Secondary | ICD-10-CM | POA: Diagnosis not present

## 2022-11-29 DIAGNOSIS — K31819 Angiodysplasia of stomach and duodenum without bleeding: Secondary | ICD-10-CM | POA: Diagnosis not present

## 2022-11-29 DIAGNOSIS — Z1331 Encounter for screening for depression: Secondary | ICD-10-CM | POA: Diagnosis not present

## 2022-11-29 DIAGNOSIS — Z87891 Personal history of nicotine dependence: Secondary | ICD-10-CM | POA: Diagnosis not present

## 2022-11-29 DIAGNOSIS — Z79899 Other long term (current) drug therapy: Secondary | ICD-10-CM | POA: Diagnosis not present

## 2022-11-29 DIAGNOSIS — Z1339 Encounter for screening examination for other mental health and behavioral disorders: Secondary | ICD-10-CM | POA: Diagnosis not present

## 2022-11-29 DIAGNOSIS — I10 Essential (primary) hypertension: Secondary | ICD-10-CM | POA: Diagnosis not present

## 2022-11-29 DIAGNOSIS — K219 Gastro-esophageal reflux disease without esophagitis: Secondary | ICD-10-CM

## 2022-11-29 DIAGNOSIS — F339 Major depressive disorder, recurrent, unspecified: Secondary | ICD-10-CM | POA: Diagnosis not present

## 2022-11-29 DIAGNOSIS — Z0000 Encounter for general adult medical examination without abnormal findings: Secondary | ICD-10-CM | POA: Diagnosis not present

## 2022-11-29 MED ORDER — PANTOPRAZOLE SODIUM 40 MG PO TBEC: 40.0000 mg | DELAYED_RELEASE_TABLET | Freq: Every day | ORAL | 3 refills | Status: DC

## 2022-11-29 MED ORDER — ONDANSETRON HCL 4 MG PO TABS: 4.0000 mg | ORAL_TABLET | Freq: Three times a day (TID) | ORAL | 0 refills | Status: AC | PRN

## 2022-11-29 NOTE — Progress Notes (Signed)
[name], M.D. Gastroenterology & Hepatology [facility] [address] [place], [place] [address]  Primary Care Physician: [name], MD [place] [address]  I will communicate my assessment and recommendations to the referring MD via EMR.  Problems: Recurrent AVMs in the stomach and small bowel History of GI bleeding due to Mallory-Weiss tear GERD  History of Present Illness: Alexa Nichols is a 77 y.o. female with past medical history of [name], recurrent iron deficiency anemia due to AVMs, COPD, asthma, depression, GERD, carcinoma of the breast  who presents for follow up of anemia.  The patient was last seen on 06/01/2022. At that time, the patient was advised to continue pantoprazole 40 mg twice a day and continue iron supplementation.  States she was running short of pantoprazole recently, so took it once a day for the last week. Felt some minimal heartburn since then, but she was able to tell a difference with the dosing change. No odynophagia or dysphagia. She stopped using the oral iron pills as she was feeling abdominal discomfort when taking them. She stopped taking this possibly a month ago. She has had some nausea intermittently a couple of times a week - she has taken some Zofran, which helps relieving her nausea. She denies having any nausea, vomiting, fever, chills, hematochezia, melena, hematemesis, abdominal distention, abdominal pain, diarrhea,  jaundice, pruritus. Has lost a couple of lb unintentionally, possibly as she is eating less than usual.  Most recent blood workup from 02/25/2022 showed a hemoglobin of 11.5, WBC 7.2 and platelets 246. She states that she had blood tests performed by his PCP today in the AM.  Last Colonoscopy:12/03/2020, 2 polyps resected from the cecum 3 polyps resected from the cecum with largest size of 7 mm, there was a polyp of 5 mm in the hepatic flexure, diverticulosis  and external hemorrhoids.  Pathology of the polyps was consistent with a tubular adenoma.  She was recommended to have a repeat colonoscopy in 5 years   Last Endoscopy:EGD on 02/02/2022 which showed presence of 6 mm bleeding Mallory-Weiss linear tear with stigmata of recent bleeding was found (adhered clot) - there was evidence of ongoing oozing after removing the clot..  For hemostasis, one hemostatic clip was successfully placed. Hematin was found in the entire examined stomach. A single 4 mm angiodysplastic lesion with stigmata of recent bleeding was found on the greater curvature of the stomach, this was treated with APC.  Duodenum was normal.    Past Medical History: Past Medical History:  Diagnosis Date   Asthma    Chronic low back pain    COPD (chronic obstructive pulmonary disease) (HCC)    Depression    GERD (gastroesophageal reflux disease)    Guillain Barr syndrome (HCC)    Heart murmur    Lobular carcinoma of left breast ([place]) 1997   in situ   Other and unspecified hyperlipidemia    Seasonal allergies     Past Surgical History: Past Surgical History:  Procedure Laterality Date   BIOPSY  12/03/2020   Procedure: BIOPSY;  Surgeon: Rogene Houston, MD;  [facility];  Service: Endoscopy;;  gastric polyps biopsies;   BREAST SURGERY Left    lumpectomy    CESAREAN SECTION  1986   CHOLECYSTECTOMY     COLONOSCOPY WITH PROPOFOL N/A 12/03/2020    two 4-7 mm polyps in cecum, one small polyp in cecum, one 5 mm polyp, sigmoid diverticulosis,  path with tubular adenomas   ENTEROSCOPY N/A 02/02/2022   Procedure: ENTEROSCOPY;  Surgeon: [name], MD;  [facility];  Service: Gastroenterology;  Laterality: N/A;   ESOPHAGOGASTRODUODENOSCOPY (EGD) WITH PROPOFOL N/A 12/03/2020   normal esophagus, multiple gastric polyps s/p biopsy, small paraesophageal hernia, normal duodenum. Fundic gland polyps.   ESOPHAGOGASTRODUODENOSCOPY (EGD) WITH PROPOFOL N/A 02/05/2021    Procedure: ESOPHAGOGASTRODUODENOSCOPY (EGD) WITH PROPOFOL;  Surgeon: [name], MD;  [facility];  Service: Endoscopy;  Laterality: N/A;   ESOPHAGOGASTRODUODENOSCOPY (EGD) WITH PROPOFOL N/A 03/03/2021   Procedure: ESOPHAGOGASTRODUODENOSCOPY (EGD) WITH PROPOFOL;  Surgeon: [name], MD;  [facility];  Service: Gastroenterology;  Laterality: N/A;  with possible push enteroscopy utilizing the pediatric colonoscope   ESOPHAGOGASTRODUODENOSCOPY (EGD) WITH PROPOFOL N/A 02/02/2022   Procedure: ESOPHAGOGASTRODUODENOSCOPY (EGD) WITH PROPOFOL;  Surgeon: [name], MD;  [facility];  Service: Gastroenterology;  Laterality: N/A;   GIVENS CAPSULE STUDY N/A 12/16/2020   normal small bowel capsule but rapid transit time of 21 minutes. Recommended to resume alendronate.    GIVENS CAPSULE STUDY N/A 02/04/2021   Procedure: GIVENS CAPSULE STUDY;  Surgeon: Rogene Houston, MD;  [facility];  Service: Endoscopy;  Laterality: N/A;   HOT HEMOSTASIS  02/05/2021   Procedure: HOT HEMOSTASIS (ARGON PLASMA COAGULATION/BICAP);  Surgeon: [name], MD;  [facility];  Service: Endoscopy;;   HOT HEMOSTASIS  03/03/2021   Procedure: HOT HEMOSTASIS (ARGON PLASMA COAGULATION/BICAP);  Surgeon: [name], [name], MD;  [facility];  Service: Gastroenterology;;   HOT HEMOSTASIS  02/02/2022   Procedure: HOT HEMOSTASIS (ARGON PLASMA COAGULATION/BICAP);  Surgeon: [name], [name], MD;  [facility];  Service: Gastroenterology;;   TONSILLECTOMY     age 79    Family History: Family History  Problem Relation Age of Onset   Osteoporosis Mother    Other Father        head trauma   Breast cancer Sister        X's 2 sisters    Social History: Social History   Tobacco Use  Smoking Status Former   Packs/day: 0.50   Years: 20.00   Total pack years: 10.00   Types: Cigarettes   Start date: 09/16/1964    Quit date: 10/05/1983   Years since quitting: 39.1  Smokeless Tobacco Never   Social History   Substance and Sexual Activity  Alcohol Use Never   Alcohol/week: 0.0 standard drinks of alcohol   Social History   Substance and Sexual Activity  Drug Use Never    Allergies: Allergies  Allergen Reactions   Aspirin     samter's syndrome   Codeine Itching   Sulfa Antibiotics     Unknown reaction   Other Rash    gel filled fentanyl patch, adhesive on that patch caused ithcing    Medications: Current Outpatient Medications  Medication Sig Dispense Refill   acetaminophen (TYLENOL) 500 MG tablet Take 500 mg by mouth every 6 (six) hours as needed for moderate pain or headache.     alendronate (FOSAMAX) 70 MG tablet Take 70 mg by mouth once a week.     ALPRAZolam (XANAX) 0.5 MG tablet Take 0.25-0.5 mg by mouth daily as needed.     Fluticasone-Umeclidin-Vilant (TRELEGY ELLIPTA) 200-62.5-25 MCG/ACT AEPB Inhale 1 puff into the lungs daily. 28 each 3   furosemide (LASIX) 20 MG tablet Take 1 tablet (20 mg total) by mouth as needed for  edema (shorness of breath or swelling). (Patient taking differently: Take 20 mg by mouth daily as needed for edema (shorness of breath or swelling).)     HYDROcodone-acetaminophen (NORCO/VICODIN) 5-325 MG tablet Take 1 tablet by mouth 2 (two) times daily as needed.     montelukast (SINGULAIR) 10 MG tablet Take 10 mg by mouth at bedtime.     Multiple Vitamins-Minerals (PRESERVISION AREDS 2) CAPS Take 1 capsule by mouth every evening.     pantoprazole (PROTONIX) 40 MG tablet Take 1 tablet (40 mg total) by mouth 2 (two) times daily. 60 tablet 3   sertraline (ZOLOFT) 25 MG tablet Take 25 mg by mouth daily.     simvastatin (ZOCOR) 20 MG tablet Take 20 mg by mouth daily.     vitamin B-12 (CYANOCOBALAMIN) 1000 MCG tablet Take 1 tablet (1,000 mcg total) by mouth daily. 30 tablet 3   diltiazem (CARDIZEM CD) 120 MG 24 hr capsule Take 1 capsule (120 mg total) by mouth  daily. (Patient not taking: Reported on 11/29/2022) 90 capsule 3   ondansetron (ZOFRAN) 4 MG tablet Take 1 tablet (4 mg total) by mouth every 6 (six) hours as needed for nausea. (Patient not taking: Reported on 11/29/2022) 20 tablet 0   No current facility-administered medications for this visit.    Review of Systems: GENERAL: negative for malaise, night sweats HEENT: No changes in hearing or vision, no nose bleeds or other nasal problems. NECK: Negative for lumps, goiter, pain and significant neck swelling RESPIRATORY: Negative for cough, wheezing CARDIOVASCULAR: Negative for chest pain, leg swelling, palpitations, orthopnea GI: SEE HPI MUSCULOSKELETAL: Negative for joint pain or swelling, back pain, and muscle pain. SKIN: Negative for lesions, rash PSYCH: Negative for sleep disturbance, mood disorder and recent psychosocial stressors. HEMATOLOGY Negative for prolonged bleeding, bruising easily, and swollen nodes. ENDOCRINE: Negative for cold or heat intolerance, polyuria, polydipsia and goiter. NEURO: negative for tremor, gait imbalance, syncope and seizures. The remainder of the review of systems is noncontributory.   Physical Exam: BP 124/72 (BP Location: Left Arm, Patient Position: Sitting, Cuff Size: Large)   Pulse (!) 47   Temp 97.8 F (36.6 C) (Temporal)   Ht '4\' 10"'$  (1.473 m)   Wt 134 lb 11.2 oz (61.1 kg)   BMI 28.15 kg/m  GENERAL: The patient is AO x3, in no acute distress. HEENT: Head is normocephalic and atraumatic. EOMI are intact. Mouth is well hydrated and without lesions. NECK: Supple. No masses LUNGS: Clear to auscultation. No presence of rhonchi/wheezing/rales. Adequate chest expansion HEART: RRR, normal s1 and s2. ABDOMEN: Soft, nontender, no guarding, no peritoneal signs, and nondistended. BS +. No masses. EXTREMITIES: Without any cyanosis, clubbing, rash, lesions or edema. NEUROLOGIC: AOx3, no focal motor deficit. SKIN: no jaundice, no  rashes  Imaging/Labs: as above  I personally reviewed and interpreted the available labs, imaging and endoscopic files.  Impression and Plan: Alexa Nichols is a 77 y.o. female with past medical history of [name], recurrent iron deficiency anemia due to AVMs, COPD, asthma, depression, GERD, carcinoma of the breast  who presents for follow up of anemia.  Patient has been doing relatively well and has not presented any recurrent symptoms of symptomatic anemia or overt GI bleeding. We will request the most recent labs today to assess response to iron supplementation.  In terms of GERD, this seems to be better controlled with once a day PPI. We will continue this dosage for now but if she were to have worsening symptoms, may  consider increasing the dose to 40/20 mg.  Finally she has presented some intermittent nausea that has responded to Zofran, will refill this medication for now.  - Decrease pantoprazole to 40 mg qday. If having worsening heartburn, take pantoprazole 20 OTC at night - Patient should take medication in the morning 30-45 minutes before eating breakfast. Discussed avoidance of eating within 2 hours of lying down to sleep and benefit of blocks to elevate head of bed. Also, will benefit from avoiding carbonated drinks/sodas or food that has tomatoes, spicy or greasy food. - Will request results of most recent labs drawn today - Can take Zofran 4 mg q8h as needed for nausea  All questions were answered.      [name], MD Gastroenterology and Hepatology [facility]

## 2022-11-29 NOTE — Patient Instructions (Signed)
Decrease pantoprazole to 40 mg qday. If having worsening heartburn, take pantoprazole 20 OTC at night patient should take medication in the morning 30-45 minutes before eating breakfast. Discussed avoidance of eating within 2 hours of lying down to sleep and benefit of blocks to elevate head of bed. Also, will benefit from avoiding carbonated drinks/sodas or food that has tomatoes, spicy or greasy food. Will request results of most recent labs in one week. Can take Zofran 4 mg q8h as needed for nausea

## 2022-11-30 LAB — COMPREHENSIVE METABOLIC PANEL: EGFR: 93

## 2022-12-02 ENCOUNTER — Encounter: Payer: Self-pay | Admitting: Radiology

## 2022-12-14 ENCOUNTER — Ambulatory Visit: Payer: Medicare Other | Attending: Cardiology | Admitting: Cardiology

## 2022-12-14 ENCOUNTER — Encounter: Payer: Self-pay | Admitting: Cardiology

## 2022-12-14 VITALS — BP 120/65 | HR 71 | Ht <= 58 in | Wt 138.4 lb

## 2022-12-14 DIAGNOSIS — I517 Cardiomegaly: Secondary | ICD-10-CM | POA: Diagnosis not present

## 2022-12-14 DIAGNOSIS — I471 Supraventricular tachycardia, unspecified: Secondary | ICD-10-CM | POA: Insufficient documentation

## 2022-12-14 DIAGNOSIS — I5032 Chronic diastolic (congestive) heart failure: Secondary | ICD-10-CM | POA: Insufficient documentation

## 2022-12-14 NOTE — Patient Instructions (Signed)
Medication Instructions:  Continue all current medications.   Labwork: none  Testing/Procedures: none  Follow-Up: 6 months   Any Other Special Instructions Will Be Listed Below (If Applicable).   If you need a refill on your cardiac medications before your next appointment, please call your pharmacy.  

## 2022-12-14 NOTE — Progress Notes (Signed)
Clinical Summary Alexa Nichols is a 77 y.o.female seen today for follow up of the following medical problems.    1. Chronic diastolic HF - admit [id number] few days after discharge for GI bleed with SOB, volume overload - thought secondary to pRBCs, IVFs from prior admission - 02/2022 echo LVEF 65-70%, grade II dd, normal RV     - no recent LE edema. Some SOB though variable. Better with prn lasix. Also better inhalers.  - home weights 134-138 lbs.  - takes lasix once every few days, about 1-2 times per week.    2. LVOT dynamic gradient -turbulent flow in LVOT noted on echo with SAM, symmetrical wall thicknessat 1.3 cm by my measure. I believe the turbulent LVOT flow is due to symmetric moderate hypertrophy with hyperdynamic LV. Would not pursue further imaging at this time    3. Palpitations/PSVT - occurs daily, occurs at rest. Feeling of heart fluttering, lasts a few seconds - drinks 1 cup of coffee. Decaf tea only, no sodas. No EtOH, no energy drinsk - ongoing for a we months.    03/2022 6 day monitor: rare PACs, 27 runs SVT longest 14 sec. Rare PVCs  - started on lopressor '25mg'$  bid, low HRs lowered to 12.'5mg'$  bid. Still ongoing bradycardia - occasional lightheadedness, no dizziness. Stopped metoprolol and had some recurrent symptoms.   -seen by EP, fatigue on lopressor changed to diltiazem - some headache on dilt initially, but improved. No fatigue on dilt.    4. Chronic LBBB     5. Asthma       6.History of GI bleed - admit 02/2022 with GI bleed due to [employer] tear - EGD on 02/02/2022 showed Mallory-Weiss tear, clip placed, Hematin in entire stomach, single, recently bleeding angiodysplastic lesion in stomach, s/p argon beam coagulation - received 2 units pRBCs - noted AVMs stomach, small bowel     7. Chronic SOB Past Medical History:  Diagnosis Date   Asthma    Chronic low back pain    COPD (chronic obstructive pulmonary disease) (HCC)    Depression    GERD  (gastroesophageal reflux disease)    Guillain Barr syndrome (HCC)    Heart murmur    Lobular carcinoma of left breast ([place]) 1997   in situ   Other and unspecified hyperlipidemia    Seasonal allergies      Allergies  Allergen Reactions   Aspirin     samter's syndrome   Codeine Itching   Sulfa Antibiotics     Unknown reaction   Other Rash    gel filled fentanyl patch, adhesive on that patch caused ithcing     Current Outpatient Medications  Medication Sig Dispense Refill   acetaminophen (TYLENOL) 500 MG tablet Take 500 mg by mouth every 6 (six) hours as needed for moderate pain or headache.     alendronate (FOSAMAX) 70 MG tablet Take 70 mg by mouth once a week.     ALPRAZolam (XANAX) 0.5 MG tablet Take 0.25-0.5 mg by mouth daily as needed.     diltiazem (CARDIZEM CD) 120 MG 24 hr capsule Take 1 capsule (120 mg total) by mouth daily. (Patient not taking: Reported on 11/29/2022) 90 capsule 3   Fluticasone-Umeclidin-Vilant (TRELEGY ELLIPTA) 200-62.5-25 MCG/ACT AEPB Inhale 1 puff into the lungs daily. 28 each 3   furosemide (LASIX) 20 MG tablet Take 1 tablet (20 mg total) by mouth as needed for edema (shorness of breath or swelling). (Patient taking differently: Take  20 mg by mouth daily as needed for edema (shorness of breath or swelling).)     HYDROcodone-acetaminophen (NORCO/VICODIN) 5-325 MG tablet Take 1 tablet by mouth 2 (two) times daily as needed.     montelukast (SINGULAIR) 10 MG tablet Take 10 mg by mouth at bedtime.     Multiple Vitamins-Minerals (PRESERVISION AREDS 2) CAPS Take 1 capsule by mouth every evening.     ondansetron (ZOFRAN) 4 MG tablet Take 1 tablet (4 mg total) by mouth every 8 (eight) hours as needed for nausea. 90 tablet 0   pantoprazole (PROTONIX) 40 MG tablet Take 1 tablet (40 mg total) by mouth daily. 90 tablet 3   sertraline (ZOLOFT) 25 MG tablet Take 25 mg by mouth daily.     simvastatin (ZOCOR) 20 MG tablet Take 20 mg by mouth daily.     vitamin B-12  (CYANOCOBALAMIN) 1000 MCG tablet Take 1 tablet (1,000 mcg total) by mouth daily. 30 tablet 3   No current facility-administered medications for this visit.     Past Surgical History:  Procedure Laterality Date   BIOPSY  12/03/2020   Procedure: BIOPSY;  Surgeon: Rogene Houston, MD;  [facility];  Service: Endoscopy;;  gastric polyps biopsies;   BREAST SURGERY Left    lumpectomy    CESAREAN SECTION  1986   CHOLECYSTECTOMY     COLONOSCOPY WITH PROPOFOL N/A 12/03/2020    two 4-7 mm polyps in cecum, one small polyp in cecum, one 5 mm polyp, sigmoid diverticulosis, path with tubular adenomas   ENTEROSCOPY N/A 02/02/2022   Procedure: ENTEROSCOPY;  Surgeon: [name], MD;  [facility];  Service: Gastroenterology;  Laterality: N/A;   ESOPHAGOGASTRODUODENOSCOPY (EGD) WITH PROPOFOL N/A 12/03/2020   normal esophagus, multiple gastric polyps s/p biopsy, small paraesophageal hernia, normal duodenum. Fundic gland polyps.   ESOPHAGOGASTRODUODENOSCOPY (EGD) WITH PROPOFOL N/A 02/05/2021   Procedure: ESOPHAGOGASTRODUODENOSCOPY (EGD) WITH PROPOFOL;  Surgeon: [name], MD;  [facility];  Service: Endoscopy;  Laterality: N/A;   ESOPHAGOGASTRODUODENOSCOPY (EGD) WITH PROPOFOL N/A 03/03/2021   Procedure: ESOPHAGOGASTRODUODENOSCOPY (EGD) WITH PROPOFOL;  Surgeon: [name], MD;  [facility];  Service: Gastroenterology;  Laterality: N/A;  with possible push enteroscopy utilizing the pediatric colonoscope   ESOPHAGOGASTRODUODENOSCOPY (EGD) WITH PROPOFOL N/A 02/02/2022   Procedure: ESOPHAGOGASTRODUODENOSCOPY (EGD) WITH PROPOFOL;  Surgeon: [name], MD;  [facility];  Service: Gastroenterology;  Laterality: N/A;   GIVENS CAPSULE STUDY N/A 12/16/2020   normal small bowel capsule but rapid transit time of 21 minutes. Recommended to resume alendronate.    GIVENS CAPSULE STUDY N/A 02/04/2021   Procedure: GIVENS CAPSULE  STUDY;  Surgeon: Rogene Houston, MD;  [facility];  Service: Endoscopy;  Laterality: N/A;   HOT HEMOSTASIS  02/05/2021   Procedure: HOT HEMOSTASIS (ARGON PLASMA COAGULATION/BICAP);  Surgeon: [name], MD;  [facility];  Service: Endoscopy;;   HOT HEMOSTASIS  03/03/2021   Procedure: HOT HEMOSTASIS (ARGON PLASMA COAGULATION/BICAP);  Surgeon: [name], [name], MD;  [facility];  Service: Gastroenterology;;   HOT HEMOSTASIS  02/02/2022   Procedure: HOT HEMOSTASIS (ARGON PLASMA COAGULATION/BICAP);  Surgeon: [name], [name], MD;  [facility];  Service: Gastroenterology;;   TONSILLECTOMY     age 75     Allergies  Allergen Reactions   Aspirin     samter's syndrome   Codeine Itching   Sulfa Antibiotics     Unknown reaction  Other Rash    gel filled fentanyl patch, adhesive on that patch caused ithcing      Family History  Problem Relation Age of Onset   Osteoporosis Mother    Other Father        head trauma   Breast cancer Sister        X's 2 sisters     Social History Alexa Nichols reports that she quit smoking about 39 years ago. Her smoking use included cigarettes. She started smoking about 58 years ago. She has a 10.00 pack-year smoking history. She has never used smokeless tobacco. Alexa Nichols reports no history of alcohol use.   Review of Systems CONSTITUTIONAL: No weight loss, fever, chills, weakness or fatigue.  HEENT: Eyes: No visual loss, blurred vision, double vision or yellow sclerae.No hearing loss, sneezing, congestion, runny nose or sore throat.  SKIN: No rash or itching.  CARDIOVASCULAR: per hpi RESPIRATORY: No shortness of breath, cough or sputum.  GASTROINTESTINAL: No anorexia, nausea, vomiting or diarrhea. No abdominal pain or blood.  GENITOURINARY: No burning on urination, no polyuria NEUROLOGICAL: No headache, dizziness, syncope, paralysis, ataxia, numbness or tingling in the  extremities. No change in bowel or bladder control.  MUSCULOSKELETAL: No muscle, back pain, joint pain or stiffness.  LYMPHATICS: No enlarged nodes. No history of splenectomy.  PSYCHIATRIC: No history of depression or anxiety.  ENDOCRINOLOGIC: No reports of sweating, cold or heat intolerance. No polyuria or polydipsia.  Marland Kitchen   Physical Examination Today's Vitals   12/14/22 1451  BP: (!) 140/70  Pulse: 71  SpO2: 95%  Weight: 138 lb 6.4 oz (62.8 kg)  Height: '4\' 9"'$  (1.448 m)   Body mass index is 29.95 kg/m.  Gen: resting comfortably, no acute distress HEENT: no scleral icterus, pupils equal round and reactive, no palptable cervical adenopathy,  CV Resp: Clear to auscultation bilaterally GI: abdomen is soft, non-tender, non-distended, normal bowel sounds, no hepatosplenomegaly MSK: extremities are warm, no edema.  Skin: warm, no rash Neuro:  no focal deficits Psych: appropriate affect   Diagnostic Studies  12/31/13 Echo LVEF 0000000, normal diastolic function, mild LAE, mild to mod MR, mild TR   01/2014 stress echo Impressions:  - Normal study after maximal exercise. [facility] treadmill score   is 7 consistent with low risk for major cardiac events.   Very good functional capacity (140% of age and gender   predicted.   Clinic EKG reviewed, LBBB   04/01/16 Clinic EKG: NSR   03/2022 monitor   6 day monitor Rare supraventricular ectopy in the form of isolated PACs, couplets, triplets. 27 runs of SVT longest 13.6 seconds Rare ventricular ectopy in the form of isolated PVCs. Symptoms correlated with sinus rhythm and runs of SVT   Assessment and Plan   1. PSVT/Tachy brady syndrome - doing well on diltiazem, continue current managmenet   2.Chronic HFpEF - no recent symptoms, continue prn lasix  3. LVH - mild to mod LVH on echo stable even as far back as 2016 - she has some turbulent LVOT flow in setting of mild to mod symmetrical LVH with hyperdynamic LV function at age 7.  Would not pursue any further imaging as not typical findings of HCM .      Arnoldo Lenis, M.D.

## 2022-12-15 DIAGNOSIS — J3089 Other allergic rhinitis: Secondary | ICD-10-CM | POA: Diagnosis not present

## 2022-12-15 DIAGNOSIS — J330 Polyp of nasal cavity: Secondary | ICD-10-CM | POA: Diagnosis not present

## 2022-12-15 DIAGNOSIS — R439 Unspecified disturbances of smell and taste: Secondary | ICD-10-CM | POA: Diagnosis not present

## 2022-12-15 DIAGNOSIS — J324 Chronic pansinusitis: Secondary | ICD-10-CM | POA: Diagnosis not present

## 2022-12-16 ENCOUNTER — Telehealth (INDEPENDENT_AMBULATORY_CARE_PROVIDER_SITE_OTHER): Payer: Self-pay | Admitting: Gastroenterology

## 2022-12-16 DIAGNOSIS — Z1231 Encounter for screening mammogram for malignant neoplasm of breast: Secondary | ICD-10-CM | POA: Diagnosis not present

## 2022-12-16 NOTE — Telephone Encounter (Signed)
I received the results of the most recent blood workup performed on 11/30/2022 which showed a CMP with creatinine 0.62, BUN 9, sodium 141, potassium 4.2, AST 18, ALT 18, alkaline phosphatase 88, total bilirubin 0.3, albumin 4.5, CBC with WBC 9.1, hemoglobin 13.5, platelets 213.  Alexa Nichols, please let her know that her hemoglobin was good and she needs to continue with pantoprazole 40 mg every day for now.

## 2022-12-17 NOTE — Telephone Encounter (Signed)
Patient made aware of the results.

## 2023-01-18 DIAGNOSIS — H353114 Nonexudative age-related macular degeneration, right eye, advanced atrophic with subfoveal involvement: Secondary | ICD-10-CM | POA: Diagnosis not present

## 2023-01-18 DIAGNOSIS — H353122 Nonexudative age-related macular degeneration, left eye, intermediate dry stage: Secondary | ICD-10-CM | POA: Diagnosis not present

## 2023-01-18 DIAGNOSIS — H353211 Exudative age-related macular degeneration, right eye, with active choroidal neovascularization: Secondary | ICD-10-CM | POA: Diagnosis not present

## 2023-02-16 DIAGNOSIS — J3089 Other allergic rhinitis: Secondary | ICD-10-CM | POA: Diagnosis not present

## 2023-02-16 DIAGNOSIS — J330 Polyp of nasal cavity: Secondary | ICD-10-CM | POA: Diagnosis not present

## 2023-02-16 DIAGNOSIS — J324 Chronic pansinusitis: Secondary | ICD-10-CM | POA: Diagnosis not present

## 2023-02-16 DIAGNOSIS — R439 Unspecified disturbances of smell and taste: Secondary | ICD-10-CM | POA: Diagnosis not present

## 2023-03-01 DIAGNOSIS — H353114 Nonexudative age-related macular degeneration, right eye, advanced atrophic with subfoveal involvement: Secondary | ICD-10-CM | POA: Diagnosis not present

## 2023-03-01 DIAGNOSIS — H353122 Nonexudative age-related macular degeneration, left eye, intermediate dry stage: Secondary | ICD-10-CM | POA: Diagnosis not present

## 2023-03-01 DIAGNOSIS — H353211 Exudative age-related macular degeneration, right eye, with active choroidal neovascularization: Secondary | ICD-10-CM | POA: Diagnosis not present

## 2023-04-12 DIAGNOSIS — H353114 Nonexudative age-related macular degeneration, right eye, advanced atrophic with subfoveal involvement: Secondary | ICD-10-CM | POA: Diagnosis not present

## 2023-04-12 DIAGNOSIS — H353211 Exudative age-related macular degeneration, right eye, with active choroidal neovascularization: Secondary | ICD-10-CM | POA: Diagnosis not present

## 2023-04-12 DIAGNOSIS — H353122 Nonexudative age-related macular degeneration, left eye, intermediate dry stage: Secondary | ICD-10-CM | POA: Diagnosis not present

## 2023-04-20 DIAGNOSIS — J324 Chronic pansinusitis: Secondary | ICD-10-CM | POA: Diagnosis not present

## 2023-04-20 DIAGNOSIS — J3089 Other allergic rhinitis: Secondary | ICD-10-CM | POA: Diagnosis not present

## 2023-04-20 DIAGNOSIS — J330 Polyp of nasal cavity: Secondary | ICD-10-CM | POA: Diagnosis not present

## 2023-04-20 DIAGNOSIS — R439 Unspecified disturbances of smell and taste: Secondary | ICD-10-CM | POA: Diagnosis not present

## 2023-05-24 DIAGNOSIS — H353122 Nonexudative age-related macular degeneration, left eye, intermediate dry stage: Secondary | ICD-10-CM | POA: Diagnosis not present

## 2023-05-24 DIAGNOSIS — H353211 Exudative age-related macular degeneration, right eye, with active choroidal neovascularization: Secondary | ICD-10-CM | POA: Diagnosis not present

## 2023-05-24 DIAGNOSIS — H353114 Nonexudative age-related macular degeneration, right eye, advanced atrophic with subfoveal involvement: Secondary | ICD-10-CM | POA: Diagnosis not present

## 2023-05-30 ENCOUNTER — Other Ambulatory Visit: Payer: Self-pay | Admitting: Cardiology

## 2023-06-01 ENCOUNTER — Other Ambulatory Visit: Payer: Self-pay | Admitting: Cardiology

## 2023-06-01 ENCOUNTER — Telehealth: Payer: Self-pay | Admitting: Cardiology

## 2023-06-01 NOTE — Telephone Encounter (Signed)
Pt called in about refill for Metoprolol. I informed her this was discontinued in October by Dr. Nelly Laurence and started Diltiazem. She states she forgot to inform Dr. Wyline Mood that she stopped Dilitiazem because it made her chest hurt badly. She wants to know if she can have metoprolol added back and sent to pharmacy.

## 2023-06-01 NOTE — Telephone Encounter (Signed)
Patient switched back to metoprolol on her own  but cut it in half she is taking 12.5 Mg BID,  She stopped taking Diltiazem on her own and she said she is no longer fatigued since cutting metoprolol in half and would like to continue on this medication

## 2023-06-02 MED ORDER — METOPROLOL TARTRATE 25 MG PO TABS: 12.5000 mg | ORAL_TABLET | Freq: Two times a day (BID) | ORAL | 2 refills | Status: DC

## 2023-06-02 NOTE — Addendum Note (Signed)
Addended by: Sharen Hones on: 06/02/2023 04:08 PM   Modules accepted: Orders

## 2023-06-02 NOTE — Telephone Encounter (Signed)
Left voicemail letting patient know medication has been changed per her request JB is completely okay with this. Diltiazem discontinued and new Rx for metoprolol Tartrate 12.5 Mg BID sent in to pharmacy

## 2023-06-02 NOTE — Telephone Encounter (Signed)
If feeling well on metoprolol 12.5mg  bid can continue that dose and d/c diltiazem  Dominga Ferry MD

## 2023-06-03 DIAGNOSIS — M549 Dorsalgia, unspecified: Secondary | ICD-10-CM | POA: Diagnosis not present

## 2023-06-03 DIAGNOSIS — Z299 Encounter for prophylactic measures, unspecified: Secondary | ICD-10-CM | POA: Diagnosis not present

## 2023-06-03 DIAGNOSIS — R0602 Shortness of breath: Secondary | ICD-10-CM | POA: Diagnosis not present

## 2023-06-03 DIAGNOSIS — F419 Anxiety disorder, unspecified: Secondary | ICD-10-CM | POA: Diagnosis not present

## 2023-06-03 DIAGNOSIS — D649 Anemia, unspecified: Secondary | ICD-10-CM | POA: Diagnosis not present

## 2023-06-03 DIAGNOSIS — I10 Essential (primary) hypertension: Secondary | ICD-10-CM | POA: Diagnosis not present

## 2023-06-03 DIAGNOSIS — J449 Chronic obstructive pulmonary disease, unspecified: Secondary | ICD-10-CM | POA: Diagnosis not present

## 2023-06-23 ENCOUNTER — Ambulatory Visit: Payer: Medicare Other | Attending: Cardiology | Admitting: Cardiology

## 2023-06-23 ENCOUNTER — Encounter: Payer: Self-pay | Admitting: Cardiology

## 2023-06-23 VITALS — BP 122/68 | HR 59 | Ht <= 58 in | Wt 143.2 lb

## 2023-06-23 DIAGNOSIS — Z79899 Other long term (current) drug therapy: Secondary | ICD-10-CM | POA: Diagnosis not present

## 2023-06-23 DIAGNOSIS — I471 Supraventricular tachycardia, unspecified: Secondary | ICD-10-CM | POA: Diagnosis present

## 2023-06-23 DIAGNOSIS — R252 Cramp and spasm: Secondary | ICD-10-CM | POA: Diagnosis not present

## 2023-06-23 DIAGNOSIS — I5032 Chronic diastolic (congestive) heart failure: Secondary | ICD-10-CM | POA: Diagnosis not present

## 2023-06-23 DIAGNOSIS — I517 Cardiomegaly: Secondary | ICD-10-CM | POA: Diagnosis present

## 2023-06-23 NOTE — Progress Notes (Signed)
Clinical Summary Ms. Bosma is a 77 y.o.female seen today for follow up of the following medical problems.    1. Chronic diastolic HF - admit 02/2022 few days after discharge for GI bleed with SOB, volume overload - thought secondary to pRBCs, IVFs from prior admission - 02/2022 echo LVEF 65-70%, grade II dd, normal RV     - no recent LE edema. Some SOB though variable. Better with prn lasix. Also better inhalers.  - home weights 134-138 lbs.  - takes lasix once every few days, about 1-2 times per week.    -no recent LE edema - no SOB/DOE    2. Leg cramps - usually in the morning with getting up - takes lasix about once a week - has had some frequent loose BMs   3. LVOT dynamic gradient -turbulent flow in LVOT noted on echo with SAM, symmetrical wall thicknessat 1.3 cm by my measure. I believe the turbulent LVOT flow is due to symmetric moderate hypertrophy with hyperdynamic LV. Would not pursue further imaging at this time   - limited SOB, no chest pains   4. Palpitations/PSVT - occurs daily, occurs at rest. Feeling of heart fluttering, lasts a few seconds - drinks 1 cup of coffee. Decaf tea only, no sodas. No EtOH, no energy drinsk - ongoing for a we months.    03/2022 6 day monitor: rare PACs, 27 runs SVT longest 14 sec. Rare PVCs  - started on lopressor 25mg  bid, low HRs lowered to 12.5mg  bid. Still ongoing bradycardia - occasional lightheadedness, no dizziness. Stopped metoprolol and had some recurrent symptoms.    -seen by EP, fatigue on lopressor changed to diltiazem - some headache on dilt initially, but improved. No fatigue on dilt.    -reported chest pain on diltiazem, she stopped taking and went back to her lopressor. Initial change was made due to fatigue.  - rare palpitations at times.    4. Chronic LBBB     5. Asthma       6.History of GI bleed - admit 02/2022 with GI bleed due to [employer] tear - EGD on 02/02/2022 showed Mallory-Weiss  tear, clip placed, Hematin in entire stomach, single, recently bleeding angiodysplastic lesion in stomach, s/p argon beam coagulation - received 2 units pRBCs - noted AVMs stomach, small bowel    Past Medical History:  Diagnosis Date   Asthma    Chronic low back pain    COPD (chronic obstructive pulmonary disease) (HCC)    Depression    GERD (gastroesophageal reflux disease)    Guillain Barr syndrome (HCC)    Heart murmur    Lobular carcinoma of left breast (HCC) 1997   in situ   Other and unspecified hyperlipidemia    Seasonal allergies      Allergies  Allergen Reactions   Aspirin     samter's syndrome   Codeine Itching   Sulfa Antibiotics     Unknown reaction   Other Rash    gel filled fentanyl patch, adhesive on that patch caused ithcing     Current Outpatient Medications  Medication Sig Dispense Refill   acetaminophen (TYLENOL) 500 MG tablet Take 500 mg by mouth every 6 (six) hours as needed for moderate pain or headache.     alendronate (FOSAMAX) 70 MG tablet Take 70 mg by mouth once a week.     ALPRAZolam (XANAX) 0.5 MG tablet Take 0.25-0.5 mg by mouth daily as needed.     Fluticasone-Umeclidin-Vilant (  TRELEGY ELLIPTA) 200-62.5-25 MCG/ACT AEPB Inhale 1 puff into the lungs daily. 28 each 3   furosemide (LASIX) 20 MG tablet Take 1 tablet (20 mg total) by mouth as needed for edema (shorness of breath or swelling). (Patient taking differently: Take 20 mg by mouth daily as needed for edema (shorness of breath or swelling).)     HYDROcodone-acetaminophen (NORCO/VICODIN) 5-325 MG tablet Take 1 tablet by mouth 2 (two) times daily as needed.     metoprolol tartrate (LOPRESSOR) 25 MG tablet Take 0.5 tablets (12.5 mg total) by mouth 2 (two) times daily. 45 tablet 2   montelukast (SINGULAIR) 10 MG tablet Take 10 mg by mouth at bedtime.     Multiple Vitamins-Minerals (PRESERVISION AREDS 2) CAPS Take 1 capsule by mouth every evening.     ondansetron (ZOFRAN) 4 MG tablet Take 1  tablet (4 mg total) by mouth every 8 (eight) hours as needed for nausea. 90 tablet 0   pantoprazole (PROTONIX) 40 MG tablet Take 1 tablet (40 mg total) by mouth daily. 90 tablet 3   sertraline (ZOLOFT) 25 MG tablet Take 25 mg by mouth daily.     simvastatin (ZOCOR) 20 MG tablet Take 20 mg by mouth daily.     vitamin B-12 (CYANOCOBALAMIN) 1000 MCG tablet Take 1 tablet (1,000 mcg total) by mouth daily. 30 tablet 3   No current facility-administered medications for this visit.     Past Surgical History:  Procedure Laterality Date   BIOPSY  12/03/2020   Procedure: BIOPSY;  Surgeon: Malissa Hippo, MD;  [facility];  Service: Endoscopy;;  gastric polyps biopsies;   BREAST SURGERY Left    lumpectomy    CESAREAN SECTION  1986   CHOLECYSTECTOMY     COLONOSCOPY WITH PROPOFOL N/A 12/03/2020    two 4-7 mm polyps in cecum, one small polyp in cecum, one 5 mm polyp, sigmoid diverticulosis, path with tubular adenomas   ENTEROSCOPY N/A 02/02/2022   Procedure: ENTEROSCOPY;  Surgeon: Dolores Frame, MD;  [facility];  Service: Gastroenterology;  Laterality: N/A;   ESOPHAGOGASTRODUODENOSCOPY (EGD) WITH PROPOFOL N/A 12/03/2020   normal esophagus, multiple gastric polyps s/p biopsy, small paraesophageal hernia, normal duodenum. Fundic gland polyps.   ESOPHAGOGASTRODUODENOSCOPY (EGD) WITH PROPOFOL N/A 02/05/2021   Procedure: ESOPHAGOGASTRODUODENOSCOPY (EGD) WITH PROPOFOL;  Surgeon: Corbin Ade, MD;  [facility];  Service: Endoscopy;  Laterality: N/A;   ESOPHAGOGASTRODUODENOSCOPY (EGD) WITH PROPOFOL N/A 03/03/2021   Procedure: ESOPHAGOGASTRODUODENOSCOPY (EGD) WITH PROPOFOL;  Surgeon: Dolores Frame, MD;  [facility];  Service: Gastroenterology;  Laterality: N/A;  with possible push enteroscopy utilizing the pediatric colonoscope   ESOPHAGOGASTRODUODENOSCOPY (EGD) WITH PROPOFOL N/A 02/02/2022   Procedure: ESOPHAGOGASTRODUODENOSCOPY (EGD) WITH  PROPOFOL;  Surgeon: Dolores Frame, MD;  [facility];  Service: Gastroenterology;  Laterality: N/A;   GIVENS CAPSULE STUDY N/A 12/16/2020   normal small bowel capsule but rapid transit time of 21 minutes. Recommended to resume alendronate.    GIVENS CAPSULE STUDY N/A 02/04/2021   Procedure: GIVENS CAPSULE STUDY;  Surgeon: Malissa Hippo, MD;  [facility];  Service: Endoscopy;  Laterality: N/A;   HOT HEMOSTASIS  02/05/2021   Procedure: HOT HEMOSTASIS (ARGON PLASMA COAGULATION/BICAP);  Surgeon: Corbin Ade, MD;  [facility];  Service: Endoscopy;;   HOT HEMOSTASIS  03/03/2021   Procedure: HOT HEMOSTASIS (ARGON PLASMA COAGULATION/BICAP);  Surgeon: Marguerita Merles, Reuel Boom, MD;  [facility];  Service: Gastroenterology;;  HOT HEMOSTASIS  02/02/2022   Procedure: HOT HEMOSTASIS (ARGON PLASMA COAGULATION/BICAP);  Surgeon: Marguerita Merles, Reuel Boom, MD;  [facility];  Service: Gastroenterology;;   TONSILLECTOMY     age 35     Allergies  Allergen Reactions   Aspirin     samter's syndrome   Codeine Itching   Sulfa Antibiotics     Unknown reaction   Other Rash    gel filled fentanyl patch, adhesive on that patch caused ithcing      Family History  Problem Relation Age of Onset   Osteoporosis Mother    Other Father        head trauma   Breast cancer Sister        X's 2 sisters     Social History Ms. Axelrod reports that she quit smoking about 39 years ago. Her smoking use included cigarettes. She started smoking about 58 years ago. She has a 10 pack-year smoking history. She has never used smokeless tobacco. Ms. Cassidy reports no history of alcohol use.   Review of Systems CONSTITUTIONAL: No weight loss, fever, chills, weakness or fatigue.  HEENT: Eyes: No visual loss, blurred vision, double vision or yellow sclerae.No hearing loss, sneezing, congestion, runny nose or sore throat.  SKIN: No rash or itching.   CARDIOVASCULAR: per hpi RESPIRATORY: No shortness of breath, cough or sputum.  GASTROINTESTINAL: No anorexia, nausea, vomiting or diarrhea. No abdominal pain or blood.  GENITOURINARY: No burning on urination, no polyuria NEUROLOGICAL: No headache, dizziness, syncope, paralysis, ataxia, numbness or tingling in the extremities. No change in bowel or bladder control.  MUSCULOSKELETAL: No muscle, back pain, joint pain or stiffness.  LYMPHATICS: No enlarged nodes. No history of splenectomy.  PSYCHIATRIC: No history of depression or anxiety.  ENDOCRINOLOGIC: No reports of sweating, cold or heat intolerance. No polyuria or polydipsia.  Marland Kitchen   Physical Examination Today's Vitals   06/23/23 1438  BP: 122/68  Pulse: (!) 59  SpO2: 97%  Weight: 143 lb 3.2 oz (65 kg)  Height: 4\' 10"  (1.473 m)   Body mass index is 29.93 kg/m.  Gen: resting comfortably, no acute distress HEENT: no scleral icterus, pupils equal round and reactive, no palptable cervical adenopathy,  CV: RRR, 2/6 systolic murmur rusb, no jvd Resp: Clear to auscultation bilaterally GI: abdomen is soft, non-tender, non-distended, normal bowel sounds, no hepatosplenomegaly MSK: extremities are warm, no edema.  Skin: warm, no rash Neuro:  no focal deficits Psych: appropriate affect   Diagnostic Studies  12/31/13 Echo LVEF 55-60%, normal diastolic function, mild LAE, mild to mod MR, mild TR   01/2014 stress echo Impressions:  - Normal study after maximal exercise. Duke treadmill score   is 7 consistent with low risk for major cardiac events.   Very good functional capacity (140% of age and gender   predicted.   Clinic EKG reviewed, LBBB   04/01/16 Clinic EKG: NSR   03/2022 monitor   6 day monitor Rare supraventricular ectopy in the form of isolated PACs, couplets, triplets. 27 runs of SVT longest 13.6 seconds Rare ventricular ectopy in the form of isolated PVCs. Symptoms correlated with sinus rhythm and runs of  SVT   Assessment and Plan  1. PSVT/Tachy brady syndrome - chest pains on diltiazem, back on lopressor 12.5mg  bid and feeling well - continue current meds   2.Chronic HFpEF - euvolemic without symptoms, continue current therapy.    3. LVH - mild to mod LVH on echo stable even as far back as  2016 - she has some turbulent LVOT flow in setting of mild to mod symmetrical LVH with hyperdynamic LV function at age 7. Would not pursue any further imaging as not typical findings of HCM .   4. Leg cramps - on diuretic, some recent frequent loose BMs - check bmet/mg      Antoine Poche, M.D.,

## 2023-06-23 NOTE — Patient Instructions (Signed)
Medication Instructions:   Continue all current medications.   Labwork:  BMET, MG - orders given Office will contact with results via phone, letter or mychart.     Testing/Procedures:  none  Follow-Up:  6 months   Any Other Special Instructions Will Be Listed Below (If Applicable).   If you need a refill on your cardiac medications before your next appointment, please call your pharmacy.

## 2023-07-01 ENCOUNTER — Telehealth: Payer: Self-pay | Admitting: Cardiology

## 2023-07-01 NOTE — Telephone Encounter (Signed)
Pt is requesting a callback regarding result. Please advise

## 2023-07-04 NOTE — Telephone Encounter (Signed)
Sorry for delay, the labs were not routed to me. Normal labs  Dominga Ferry MD

## 2023-07-04 NOTE — Telephone Encounter (Signed)
No answer

## 2023-07-06 NOTE — Telephone Encounter (Signed)
Patient notified and verbalized understanding. 

## 2023-07-12 DIAGNOSIS — H353211 Exudative age-related macular degeneration, right eye, with active choroidal neovascularization: Secondary | ICD-10-CM | POA: Diagnosis not present

## 2023-07-12 DIAGNOSIS — H353122 Nonexudative age-related macular degeneration, left eye, intermediate dry stage: Secondary | ICD-10-CM | POA: Diagnosis not present

## 2023-07-12 DIAGNOSIS — H353114 Nonexudative age-related macular degeneration, right eye, advanced atrophic with subfoveal involvement: Secondary | ICD-10-CM | POA: Diagnosis not present

## 2023-09-06 DIAGNOSIS — H353114 Nonexudative age-related macular degeneration, right eye, advanced atrophic with subfoveal involvement: Secondary | ICD-10-CM | POA: Diagnosis not present

## 2023-09-06 DIAGNOSIS — H353211 Exudative age-related macular degeneration, right eye, with active choroidal neovascularization: Secondary | ICD-10-CM | POA: Diagnosis not present

## 2023-09-06 DIAGNOSIS — H353122 Nonexudative age-related macular degeneration, left eye, intermediate dry stage: Secondary | ICD-10-CM | POA: Diagnosis not present

## 2023-09-21 DIAGNOSIS — J3089 Other allergic rhinitis: Secondary | ICD-10-CM | POA: Diagnosis not present

## 2023-09-21 DIAGNOSIS — R499 Unspecified voice and resonance disorder: Secondary | ICD-10-CM | POA: Diagnosis not present

## 2023-09-21 DIAGNOSIS — J330 Polyp of nasal cavity: Secondary | ICD-10-CM | POA: Diagnosis not present

## 2023-09-21 DIAGNOSIS — J324 Chronic pansinusitis: Secondary | ICD-10-CM | POA: Diagnosis not present

## 2023-09-21 DIAGNOSIS — R0982 Postnasal drip: Secondary | ICD-10-CM | POA: Diagnosis not present

## 2023-09-29 ENCOUNTER — Other Ambulatory Visit: Payer: Self-pay | Admitting: Cardiology

## 2023-10-04 DIAGNOSIS — R059 Cough, unspecified: Secondary | ICD-10-CM | POA: Diagnosis not present

## 2023-10-04 DIAGNOSIS — M546 Pain in thoracic spine: Secondary | ICD-10-CM | POA: Diagnosis not present

## 2023-10-04 DIAGNOSIS — Z299 Encounter for prophylactic measures, unspecified: Secondary | ICD-10-CM | POA: Diagnosis not present

## 2023-10-04 DIAGNOSIS — J069 Acute upper respiratory infection, unspecified: Secondary | ICD-10-CM | POA: Diagnosis not present

## 2023-11-01 DIAGNOSIS — H353114 Nonexudative age-related macular degeneration, right eye, advanced atrophic with subfoveal involvement: Secondary | ICD-10-CM | POA: Diagnosis not present

## 2023-11-01 DIAGNOSIS — H353211 Exudative age-related macular degeneration, right eye, with active choroidal neovascularization: Secondary | ICD-10-CM | POA: Diagnosis not present

## 2023-11-01 DIAGNOSIS — H353122 Nonexudative age-related macular degeneration, left eye, intermediate dry stage: Secondary | ICD-10-CM | POA: Diagnosis not present

## 2023-11-15 ENCOUNTER — Other Ambulatory Visit (INDEPENDENT_AMBULATORY_CARE_PROVIDER_SITE_OTHER): Payer: Self-pay | Admitting: Gastroenterology

## 2023-11-15 DIAGNOSIS — H35351 Cystoid macular degeneration, right eye: Secondary | ICD-10-CM | POA: Diagnosis not present

## 2023-11-15 DIAGNOSIS — H353211 Exudative age-related macular degeneration, right eye, with active choroidal neovascularization: Secondary | ICD-10-CM | POA: Diagnosis not present

## 2023-11-15 DIAGNOSIS — Z9889 Other specified postprocedural states: Secondary | ICD-10-CM | POA: Diagnosis not present

## 2023-11-15 DIAGNOSIS — K219 Gastro-esophageal reflux disease without esophagitis: Secondary | ICD-10-CM

## 2023-11-15 NOTE — Telephone Encounter (Signed)
Will need to keep 11/2023 appointment for further refills.

## 2023-11-28 ENCOUNTER — Ambulatory Visit (INDEPENDENT_AMBULATORY_CARE_PROVIDER_SITE_OTHER): Payer: Medicare Other | Admitting: Gastroenterology

## 2023-11-28 ENCOUNTER — Encounter (INDEPENDENT_AMBULATORY_CARE_PROVIDER_SITE_OTHER): Payer: Self-pay | Admitting: Gastroenterology

## 2023-11-28 VITALS — BP 127/75 | HR 69 | Temp 97.8°F | Ht <= 58 in | Wt 149.2 lb

## 2023-11-28 DIAGNOSIS — Z8719 Personal history of other diseases of the digestive system: Secondary | ICD-10-CM

## 2023-11-28 DIAGNOSIS — K219 Gastro-esophageal reflux disease without esophagitis: Secondary | ICD-10-CM | POA: Diagnosis not present

## 2023-11-28 DIAGNOSIS — K5520 Angiodysplasia of colon without hemorrhage: Secondary | ICD-10-CM

## 2023-11-28 NOTE — Progress Notes (Signed)
 Katrinka Blazing, M.D. Gastroenterology & Hepatology [facility] Gastroenterology [address] [place], [place] [address]  Primary Care Physician: Ignatius Specking, MD [address] [place] [address]  I will communicate my assessment and recommendations to the referring MD via EMR.  Problems: Recurrent AVMs in the stomach and small bowel History of GI bleeding due to Mallory-Weiss tear GERD   History of Present Illness: Alexa Nichols is a 78 y.o. female with past medical history of Katheran Awe, recurrent iron deficiency anemia due to AVMs, COPD, asthma, depression, GERD, carcinoma of the breast  who presents for follow up of anemia and history of AVMs.  The patient was last seen on 11/29/2022. At that time, the patient was advised to decrease pantoprazole to 40 mg every day for GERD.  Patient reports that occasionally she has some nausea episodes, but she does not vomit. May have these episodes 2-3 times per month, which improves with Zofran, although she does not take this at all.  She also reports that she has presented a few episodes of stinging pain in her RUQ, which lasts for a few minutes and resolves spontaneously.  Stools are soft but does not have diarrhea.  The patient denies having any nausea, vomiting, fever, chills, hematochezia, melena, hematemesis, abdominal distention, diarrhea, jaundice, pruritus, heartburn, dysphagia or weight loss.  Most recent labs from 06/03/2023 showed a hemoglobin of 13.1, WBC 6.8 and platelets 211.  Last Colonoscopy:12/03/2020, 3 polyps resected from the cecum with largest size of 7 mm, there was a polyp of 5 mm in the hepatic flexure, diverticulosis and external hemorrhoids.  Pathology of the polyps was consistent with a tubular adenoma.  She was recommended to have a repeat colonoscopy in 5 years   Last Endoscopy:EGD on 02/02/2022 which showed presence of 6 mm bleeding Mallory-Weiss linear tear with stigmata of recent  bleeding was found (adhered clot) - there was evidence of ongoing oozing after removing the clot..  For hemostasis, one hemostatic clip was successfully placed. Hematin was found in the entire examined stomach. A single 4 mm angiodysplastic lesion with stigmata of recent bleeding was found on the greater curvature of the stomach, this was treated with APC.  Duodenum was normal.    Past Medical History: Past Medical History:  Diagnosis Date   Asthma    Chronic low back pain    COPD (chronic obstructive pulmonary disease) (HCC)    Depression    GERD (gastroesophageal reflux disease)    Guillain Barr syndrome (HCC)    Heart murmur    Lobular carcinoma of left breast (HCC) 1997   in situ   Other and unspecified hyperlipidemia    Seasonal allergies     Past Surgical History: Past Surgical History:  Procedure Laterality Date   BIOPSY  12/03/2020   Procedure: BIOPSY;  Surgeon: Malissa Hippo, MD;  [facility];  Service: Endoscopy;;  gastric polyps biopsies;   BREAST SURGERY Left    lumpectomy    CESAREAN SECTION  1986   CHOLECYSTECTOMY     COLONOSCOPY WITH PROPOFOL N/A 12/03/2020    two 4-7 mm polyps in cecum, one small polyp in cecum, one 5 mm polyp, sigmoid diverticulosis, path with tubular adenomas   ENTEROSCOPY N/A 02/02/2022   Procedure: ENTEROSCOPY;  Surgeon: Dolores Frame, MD;  [facility];  Service: Gastroenterology;  Laterality: N/A;   ESOPHAGOGASTRODUODENOSCOPY (EGD) WITH PROPOFOL N/A 12/03/2020   normal esophagus, multiple gastric polyps s/p biopsy, small  paraesophageal hernia, normal duodenum. Fundic gland polyps.   ESOPHAGOGASTRODUODENOSCOPY (EGD) WITH PROPOFOL N/A 02/05/2021   Procedure: ESOPHAGOGASTRODUODENOSCOPY (EGD) WITH PROPOFOL;  Surgeon: Corbin Ade, MD;  [facility];  Service: Endoscopy;  Laterality: N/A;   ESOPHAGOGASTRODUODENOSCOPY (EGD) WITH PROPOFOL N/A 03/03/2021   Procedure: ESOPHAGOGASTRODUODENOSCOPY (EGD) WITH  PROPOFOL;  Surgeon: Dolores Frame, MD;  [facility];  Service: Gastroenterology;  Laterality: N/A;  with possible push enteroscopy utilizing the pediatric colonoscope   ESOPHAGOGASTRODUODENOSCOPY (EGD) WITH PROPOFOL N/A 02/02/2022   Procedure: ESOPHAGOGASTRODUODENOSCOPY (EGD) WITH PROPOFOL;  Surgeon: Dolores Frame, MD;  [facility];  Service: Gastroenterology;  Laterality: N/A;   GIVENS CAPSULE STUDY N/A 12/16/2020   normal small bowel capsule but rapid transit time of 21 minutes. Recommended to resume alendronate.    GIVENS CAPSULE STUDY N/A 02/04/2021   Procedure: GIVENS CAPSULE STUDY;  Surgeon: Malissa Hippo, MD;  [facility];  Service: Endoscopy;  Laterality: N/A;   HOT HEMOSTASIS  02/05/2021   Procedure: HOT HEMOSTASIS (ARGON PLASMA COAGULATION/BICAP);  Surgeon: Corbin Ade, MD;  [facility];  Service: Endoscopy;;   HOT HEMOSTASIS  03/03/2021   Procedure: HOT HEMOSTASIS (ARGON PLASMA COAGULATION/BICAP);  Surgeon: Marguerita Merles, Reuel Boom, MD;  [facility];  Service: Gastroenterology;;   HOT HEMOSTASIS  02/02/2022   Procedure: HOT HEMOSTASIS (ARGON PLASMA COAGULATION/BICAP);  Surgeon: Marguerita Merles, Reuel Boom, MD;  [facility];  Service: Gastroenterology;;   TONSILLECTOMY     age 79    Family History: Family History  Problem Relation Age of Onset   Osteoporosis Mother    Other Father        head trauma   Breast cancer Sister        X's 2 sisters    Social History: Social History   Tobacco Use  Smoking Status Former   Current packs/day: 0.00   Average packs/day: 0.5 packs/day for 20.0 years (10.0 ttl pk-yrs)   Types: Cigarettes   Start date: 09/16/1964   Quit date: 10/05/1983   Years since quitting: 40.1  Smokeless Tobacco Never   Social History   Substance and Sexual Activity  Alcohol Use Never   Alcohol/week: 0.0 standard drinks of alcohol   Social History   Substance and  Sexual Activity  Drug Use Never    Allergies: Allergies  Allergen Reactions   Aspirin     samter's syndrome   Codeine Itching   Sulfa Antibiotics     Unknown reaction   Other Rash    gel filled fentanyl patch, adhesive on that patch caused ithcing    Medications: Current Outpatient Medications  Medication Sig Dispense Refill   acetaminophen (TYLENOL) 500 MG tablet Take 500 mg by mouth every 6 (six) hours as needed for moderate pain or headache.     alendronate (FOSAMAX) 70 MG tablet Take 70 mg by mouth once a week.     ALPRAZolam (XANAX) 0.5 MG tablet Take 0.25-0.5 mg by mouth daily as needed.     Fluticasone-Umeclidin-Vilant (TRELEGY ELLIPTA) 200-62.5-25 MCG/ACT AEPB Inhale 1 puff into the lungs daily. 28 each 3   furosemide (LASIX) 20 MG tablet Take 1 tablet (20 mg total) by mouth as needed for edema (shorness of breath or swelling). (Patient taking differently: Take 20 mg by mouth daily as needed for edema (shorness of breath or swelling).)     HYDROcodone-acetaminophen (NORCO/VICODIN) 5-325 MG tablet Take 1 tablet by mouth 2 (two) times daily as needed.  metoprolol tartrate (LOPRESSOR) 25 MG tablet Take 0.5 tablets (12.5 mg total) by mouth 2 (two) times daily. 90 tablet 2   montelukast (SINGULAIR) 10 MG tablet Take 10 mg by mouth at bedtime.     ondansetron (ZOFRAN) 4 MG tablet Take 1 tablet (4 mg total) by mouth every 8 (eight) hours as needed for nausea. 90 tablet 0   pantoprazole (PROTONIX) 40 MG tablet TAKE 1 TABLET DAILY 90 tablet 0   sertraline (ZOLOFT) 25 MG tablet Take 25 mg by mouth daily.     simvastatin (ZOCOR) 20 MG tablet Take 20 mg by mouth daily.     vitamin B-12 (CYANOCOBALAMIN) 1000 MCG tablet Take 1 tablet (1,000 mcg total) by mouth daily. 30 tablet 3   No current facility-administered medications for this visit.    Review of Systems: GENERAL: negative for malaise, night sweats HEENT: No changes in hearing or vision, no nose bleeds or other nasal  problems. NECK: Negative for lumps, goiter, pain and significant neck swelling RESPIRATORY: Negative for cough, wheezing CARDIOVASCULAR: Negative for chest pain, leg swelling, palpitations, orthopnea GI: SEE HPI MUSCULOSKELETAL: Negative for joint pain or swelling, back pain, and muscle pain. SKIN: Negative for lesions, rash PSYCH: Negative for sleep disturbance, mood disorder and recent psychosocial stressors. HEMATOLOGY Negative for prolonged bleeding, bruising easily, and swollen nodes. ENDOCRINE: Negative for cold or heat intolerance, polyuria, polydipsia and goiter. NEURO: negative for tremor, gait imbalance, syncope and seizures. The remainder of the review of systems is noncontributory.   Physical Exam: BP 127/75 (BP Location: Left Arm, Patient Position: Sitting, Cuff Size: Normal)   Pulse 69   Temp 97.8 F (36.6 C) (Temporal)   Ht 4\' 10"  (1.473 m)   Wt 149 lb 3.2 oz (67.7 kg)   BMI 31.18 kg/m  GENERAL: The patient is AO x3, in no acute distress. HEENT: Head is normocephalic and atraumatic. EOMI are intact. Mouth is well hydrated and without lesions. NECK: Supple. No masses LUNGS: Clear to auscultation. No presence of rhonchi/wheezing/rales. Adequate chest expansion HEART: RRR, normal s1 and s2. ABDOMEN: Soft, nontender, no guarding, no peritoneal signs, and nondistended. BS +. No masses. EXTREMITIES: Without any cyanosis, clubbing, rash, lesions or edema. NEUROLOGIC: AOx3, no focal motor deficit. SKIN: no jaundice, no rashes  Imaging/Labs: as above  I personally reviewed and interpreted the available labs, imaging and endoscopic files.  Impression and Plan: Alexa Nichols is a 78 y.o. female with past medical history of Katheran Awe, recurrent iron deficiency anemia due to AVMs, COPD, asthma, depression, GERD, carcinoma of the breast  who presents for follow up of anemia and history of AVMs.  Patient has presented significant improvement of her anemia after most  recent interventions and her hemoglobin has remained stable through time.  She denies having any complaints and in fact her GERD has been controlled adequately with medium dose pantoprazole.  This moment we will continue to monitoring clinically and will continue same doses of PPI daily.  - Continue pantoprazole 40 mg every day  All questions were answered.      Katrinka Blazing, MD Gastroenterology and Hepatology [facility] Gastroenterology

## 2023-11-28 NOTE — Patient Instructions (Signed)
 Continue pantoprazole 40 mg every day.

## 2023-12-12 DIAGNOSIS — Z6830 Body mass index (BMI) 30.0-30.9, adult: Secondary | ICD-10-CM | POA: Diagnosis not present

## 2023-12-12 DIAGNOSIS — Z7189 Other specified counseling: Secondary | ICD-10-CM | POA: Diagnosis not present

## 2023-12-12 DIAGNOSIS — R053 Chronic cough: Secondary | ICD-10-CM | POA: Diagnosis not present

## 2023-12-12 DIAGNOSIS — Z87891 Personal history of nicotine dependence: Secondary | ICD-10-CM | POA: Diagnosis not present

## 2023-12-12 DIAGNOSIS — R059 Cough, unspecified: Secondary | ICD-10-CM | POA: Diagnosis not present

## 2023-12-12 DIAGNOSIS — Z0000 Encounter for general adult medical examination without abnormal findings: Secondary | ICD-10-CM | POA: Diagnosis not present

## 2023-12-12 DIAGNOSIS — Z299 Encounter for prophylactic measures, unspecified: Secondary | ICD-10-CM | POA: Diagnosis not present

## 2023-12-12 DIAGNOSIS — G4700 Insomnia, unspecified: Secondary | ICD-10-CM | POA: Diagnosis not present

## 2023-12-12 DIAGNOSIS — Z1331 Encounter for screening for depression: Secondary | ICD-10-CM | POA: Diagnosis not present

## 2023-12-12 DIAGNOSIS — I10 Essential (primary) hypertension: Secondary | ICD-10-CM | POA: Diagnosis not present

## 2023-12-12 DIAGNOSIS — M40204 Unspecified kyphosis, thoracic region: Secondary | ICD-10-CM | POA: Diagnosis not present

## 2023-12-12 DIAGNOSIS — Z79899 Other long term (current) drug therapy: Secondary | ICD-10-CM | POA: Diagnosis not present

## 2023-12-12 DIAGNOSIS — E7800 Pure hypercholesterolemia, unspecified: Secondary | ICD-10-CM | POA: Diagnosis not present

## 2023-12-12 DIAGNOSIS — J984 Other disorders of lung: Secondary | ICD-10-CM | POA: Diagnosis not present

## 2023-12-12 DIAGNOSIS — Z1339 Encounter for screening examination for other mental health and behavioral disorders: Secondary | ICD-10-CM | POA: Diagnosis not present

## 2023-12-12 DIAGNOSIS — R5383 Other fatigue: Secondary | ICD-10-CM | POA: Diagnosis not present

## 2023-12-13 DIAGNOSIS — H353211 Exudative age-related macular degeneration, right eye, with active choroidal neovascularization: Secondary | ICD-10-CM | POA: Diagnosis not present

## 2023-12-13 DIAGNOSIS — H353122 Nonexudative age-related macular degeneration, left eye, intermediate dry stage: Secondary | ICD-10-CM | POA: Diagnosis not present

## 2023-12-13 DIAGNOSIS — H353114 Nonexudative age-related macular degeneration, right eye, advanced atrophic with subfoveal involvement: Secondary | ICD-10-CM | POA: Diagnosis not present

## 2023-12-19 DIAGNOSIS — I089 Rheumatic multiple valve disease, unspecified: Secondary | ICD-10-CM | POA: Diagnosis not present

## 2023-12-20 DIAGNOSIS — Z1231 Encounter for screening mammogram for malignant neoplasm of breast: Secondary | ICD-10-CM | POA: Diagnosis not present

## 2023-12-26 ENCOUNTER — Encounter: Payer: Self-pay | Admitting: *Deleted

## 2023-12-29 ENCOUNTER — Encounter: Payer: Self-pay | Admitting: Cardiology

## 2023-12-29 ENCOUNTER — Ambulatory Visit: Payer: Medicare Other | Attending: Cardiology | Admitting: Cardiology

## 2023-12-29 ENCOUNTER — Encounter: Payer: Self-pay | Admitting: Nurse Practitioner

## 2023-12-29 VITALS — BP 118/60 | HR 56 | Ht <= 58 in | Wt 146.8 lb

## 2023-12-29 DIAGNOSIS — I5032 Chronic diastolic (congestive) heart failure: Secondary | ICD-10-CM | POA: Insufficient documentation

## 2023-12-29 DIAGNOSIS — I517 Cardiomegaly: Secondary | ICD-10-CM | POA: Diagnosis not present

## 2023-12-29 DIAGNOSIS — I471 Supraventricular tachycardia, unspecified: Secondary | ICD-10-CM | POA: Insufficient documentation

## 2023-12-29 NOTE — Progress Notes (Signed)
 Clinical Summary Ms. Dragon is a 78 y.o.female seen today for follow up of the following medical problems.    1. Chronic diastolic HF - admit 02/2022 few days after discharge for GI bleed with SOB, volume overload - thought secondary to pRBCs, IVFs from prior admission - 02/2022 echo LVEF 65-70%, grade II dd, normal RV     - chronic SOB, variable. Better with inhalers - no LE edema.   - compliant with meds. Rarely needs her prn      2. Leg cramps - usually in the morning with getting up - she reports her pcp had done a vascular screening exam and found possibly some changes in her circulation, they have ordered a dedication test of the lower extremities.    3. LVOT dynamic gradient -turbulent flow in LVOT noted on echo with SAM, symmetrical wall thicknessat 1.3 cm by my measure. I believe the turbulent LVOT flow is due to symmetric moderate hypertrophy with hyperdynamic LV. Would not pursue further imaging at this time       4. Palpitations/PSVT - occurs daily, occurs at rest. Feeling of heart fluttering, lasts a few seconds - drinks 1 cup of coffee. Decaf tea only, no sodas. No EtOH, no energy drinsk - ongoing for a we months.    03/2022 6 day monitor: rare PACs, 27 runs SVT longest 14 sec. Rare PVCs  - started on lopressor 25mg  bid, low HRs lowered to 12.5mg  bid. Still ongoing bradycardia - occasional lightheadedness, no dizziness. Stopped metoprolol and had some recurrent symptoms.    -seen by EP, fatigue on lopressor changed to diltiazem - some headache on dilt initially, but improved. No fatigue on dilt.      -reported chest pain on diltiazem, she stopped taking and went back to her lopressor. Initial change was made due to fatigue.  No significant palpitations.   4. Chronic LBBB     5. Asthma       6.History of GI bleed - admit 02/2022 with GI bleed due to [employer] tear - EGD on 02/02/2022 showed Mallory-Weiss tear, clip placed, Hematin in entire  stomach, single, recently bleeding angiodysplastic lesion in stomach, s/p argon beam coagulation - received 2 units pRBCs - noted AVMs stomach, small bowel   Past Medical History:  Diagnosis Date   Asthma    Chronic low back pain    COPD (chronic obstructive pulmonary disease) (HCC)    Depression    GERD (gastroesophageal reflux disease)    Guillain Barr syndrome (HCC)    Heart murmur    Lobular carcinoma of left breast (HCC) 1997   in situ   Other and unspecified hyperlipidemia    Seasonal allergies      Allergies  Allergen Reactions   Aspirin     samter's syndrome   Codeine Itching   Sulfa Antibiotics     Unknown reaction   Other Rash    gel filled fentanyl patch, adhesive on that patch caused ithcing     Current Outpatient Medications  Medication Sig Dispense Refill   acetaminophen (TYLENOL) 500 MG tablet Take 500 mg by mouth every 6 (six) hours as needed for moderate pain or headache.     alendronate (FOSAMAX) 70 MG tablet Take 70 mg by mouth once a week.     ALPRAZolam (XANAX) 0.5 MG tablet Take 0.25-0.5 mg by mouth daily as needed.     Fluticasone-Umeclidin-Vilant (TRELEGY ELLIPTA) 200-62.5-25 MCG/ACT AEPB Inhale 1 puff into the lungs daily.  28 each 3   furosemide (LASIX) 20 MG tablet Take 1 tablet (20 mg total) by mouth as needed for edema (shorness of breath or swelling). (Patient taking differently: Take 20 mg by mouth daily as needed for edema (shorness of breath or swelling).)     HYDROcodone-acetaminophen (NORCO/VICODIN) 5-325 MG tablet Take 1 tablet by mouth 2 (two) times daily as needed.     metoprolol tartrate (LOPRESSOR) 25 MG tablet Take 0.5 tablets (12.5 mg total) by mouth 2 (two) times daily. 90 tablet 2   montelukast (SINGULAIR) 10 MG tablet Take 10 mg by mouth at bedtime.     ondansetron (ZOFRAN) 4 MG tablet Take 1 tablet (4 mg total) by mouth every 8 (eight) hours as needed for nausea. 90 tablet 0   pantoprazole (PROTONIX) 40 MG tablet TAKE 1 TABLET  DAILY 90 tablet 0   sertraline (ZOLOFT) 25 MG tablet Take 25 mg by mouth daily.     simvastatin (ZOCOR) 20 MG tablet Take 20 mg by mouth daily.     vitamin B-12 (CYANOCOBALAMIN) 1000 MCG tablet Take 1 tablet (1,000 mcg total) by mouth daily. 30 tablet 3   No current facility-administered medications for this visit.     Past Surgical History:  Procedure Laterality Date   BIOPSY  12/03/2020   Procedure: BIOPSY;  Surgeon: Malissa Hippo, MD;  [facility]: [facility];  Service: Endoscopy;;  gastric polyps biopsies;   BREAST SURGERY Left    lumpectomy    CESAREAN SECTION  1986   CHOLECYSTECTOMY     COLONOSCOPY WITH PROPOFOL N/A 12/03/2020    two 4-7 mm polyps in cecum, one small polyp in cecum, one 5 mm polyp, sigmoid diverticulosis, path with tubular adenomas   ENTEROSCOPY N/A 02/02/2022   Procedure: ENTEROSCOPY;  Surgeon: Dolores Frame, MD;  Location: [facility];  Service: Gastroenterology;  Laterality: N/A;   ESOPHAGOGASTRODUODENOSCOPY (EGD) WITH PROPOFOL N/A 12/03/2020   normal esophagus, multiple gastric polyps s/p biopsy, small paraesophageal hernia, normal duodenum. Fundic gland polyps.   ESOPHAGOGASTRODUODENOSCOPY (EGD) WITH PROPOFOL N/A 02/05/2021   Procedure: ESOPHAGOGASTRODUODENOSCOPY (EGD) WITH PROPOFOL;  Surgeon: Corbin Ade, MD;  [facility]: [facility];  Service: Endoscopy;  Laterality: N/A;   ESOPHAGOGASTRODUODENOSCOPY (EGD) WITH PROPOFOL N/A 03/03/2021   Procedure: ESOPHAGOGASTRODUODENOSCOPY (EGD) WITH PROPOFOL;  Surgeon: Dolores Frame, MD;  [facility]: [facility];  Service: Gastroenterology;  Laterality: N/A;  with possible push enteroscopy utilizing the pediatric colonoscope   ESOPHAGOGASTRODUODENOSCOPY (EGD) WITH PROPOFOL N/A 02/02/2022   Procedure: ESOPHAGOGASTRODUODENOSCOPY (EGD) WITH PROPOFOL;  Surgeon: Dolores Frame, MD;  [facility]: [facility];  Service: Gastroenterology;  Laterality: N/A;   GIVENS CAPSULE STUDY N/A 12/16/2020    normal small bowel capsule but rapid transit time of 21 minutes. Recommended to resume alendronate.    GIVENS CAPSULE STUDY N/A 02/04/2021   Procedure: GIVENS CAPSULE STUDY;  Surgeon: Malissa Hippo, MD;  [facility]: [facility];  Service: Endoscopy;  Laterality: N/A;   HOT HEMOSTASIS  02/05/2021   Procedure: HOT HEMOSTASIS (ARGON PLASMA COAGULATION/BICAP);  Surgeon: Corbin Ade, MD;  [facility]: [facility];  Service: Endoscopy;;   HOT HEMOSTASIS  03/03/2021   Procedure: HOT HEMOSTASIS (ARGON PLASMA COAGULATION/BICAP);  Surgeon: Marguerita Merles, Reuel Boom, MD;  [facility]: [facility];  Service: Gastroenterology;;   HOT HEMOSTASIS  02/02/2022   Procedure: HOT HEMOSTASIS (ARGON PLASMA COAGULATION/BICAP);  Surgeon: Marguerita Merles, Reuel Boom, MD;  [facility]: [facility];  Service: Gastroenterology;;   TONSILLECTOMY     age  18     Allergies  Allergen Reactions   Aspirin     samter's syndrome   Codeine Itching   Sulfa Antibiotics     Unknown reaction   Other Rash    gel filled fentanyl patch, adhesive on that patch caused ithcing      Family History  Problem Relation Age of Onset   Osteoporosis Mother    Other Father        head trauma   Breast cancer Sister        X's 2 sisters     Social History Ms. Loftin reports that she quit smoking about 40 years ago. Her smoking use included cigarettes. She started smoking about 59 years ago. She has a 10 pack-year smoking history. She has never used smokeless tobacco. Ms. Gasser reports no history of alcohol use.   Physical Examination Today's Vitals   12/29/23 1250 12/29/23 1255 12/29/23 1325  BP: (!) 146/86 138/82 118/60  Pulse: (!) 56    SpO2: 98%    Weight: 146 lb 12.8 oz (66.6 kg)    Height: 4\' 10"  (1.473 m)     Body mass index is 30.68 kg/m.  Gen: resting comfortably, no acute distress HEENT: no scleral icterus, pupils equal round and reactive, no palptable cervical adenopathy,  CV: RRR, 2/6 systolic murmur rusb,  no jvd Resp: Clear to auscultation bilaterally GI: abdomen is soft, non-tender, non-distended, normal bowel sounds, no hepatosplenomegaly MSK: extremities are warm, no edema.  Skin: warm, no rash Neuro:  no focal deficits Psych: appropriate affect   Diagnostic Studies  12/31/13 Echo LVEF 55-60%, normal diastolic function, mild LAE, mild to mod MR, mild TR   01/2014 stress echo Impressions:  - Normal study after maximal exercise. Duke treadmill score   is 7 consistent with low risk for major cardiac events.   Very good functional capacity (140% of age and gender   predicted.   Clinic EKG reviewed, LBBB   04/01/16 Clinic EKG: NSR   03/2022 monitor   6 day monitor Rare supraventricular ectopy in the form of isolated PACs, couplets, triplets. 27 runs of SVT longest 13.6 seconds Rare ventricular ectopy in the form of isolated PVCs. Symptoms correlated with sinus rhythm and runs of SVT     Assessment and Plan   1. PSVT/Tachy brady syndrome - chest pains on diltiazem, back on lopressor 12.5mg  bid and has done swell - EKG today shows sinus brady 56 with chronic LBBB - continue current meds   2.Chronic HFpEF - overall doing well, rarely needs her prn lasix - continue current therapy.    3. LVH - mild to mod LVH on echo stable even as far back as 2016 - she has some turbulent LVOT flow in setting of mild to mod symmetrical LVH with hyperdynamic LV function at age 76 and thus we have not pursued cMRI - we will update an echo look for any significant progression of her LVH.       Antoine Poche, M.D.

## 2023-12-29 NOTE — Patient Instructions (Signed)

## 2024-01-02 ENCOUNTER — Encounter: Payer: Self-pay | Admitting: Nurse Practitioner

## 2024-01-09 DIAGNOSIS — I70223 Atherosclerosis of native arteries of extremities with rest pain, bilateral legs: Secondary | ICD-10-CM | POA: Diagnosis not present

## 2024-01-10 DIAGNOSIS — I70223 Atherosclerosis of native arteries of extremities with rest pain, bilateral legs: Secondary | ICD-10-CM | POA: Diagnosis not present

## 2024-01-17 ENCOUNTER — Ambulatory Visit: Attending: Cardiology

## 2024-01-17 DIAGNOSIS — I517 Cardiomegaly: Secondary | ICD-10-CM | POA: Insufficient documentation

## 2024-01-18 LAB — ECHOCARDIOGRAM COMPLETE
AR max vel: 2.35 cm2
AV Area VTI: 2.75 cm2
AV Area mean vel: 2.73 cm2
AV Mean grad: 8 mmHg
AV Peak grad: 15.7 mmHg
Ao pk vel: 1.98 m/s
Area-P 1/2: 2.62 cm2
Calc EF: 66.7 %
MV VTI: 2.2 cm2
S' Lateral: 2.4 cm
Single Plane A2C EF: 60 %
Single Plane A4C EF: 65 %

## 2024-01-31 DIAGNOSIS — H353114 Nonexudative age-related macular degeneration, right eye, advanced atrophic with subfoveal involvement: Secondary | ICD-10-CM | POA: Diagnosis not present

## 2024-01-31 DIAGNOSIS — H353122 Nonexudative age-related macular degeneration, left eye, intermediate dry stage: Secondary | ICD-10-CM | POA: Diagnosis not present

## 2024-01-31 DIAGNOSIS — H353211 Exudative age-related macular degeneration, right eye, with active choroidal neovascularization: Secondary | ICD-10-CM | POA: Diagnosis not present

## 2024-02-13 ENCOUNTER — Other Ambulatory Visit (INDEPENDENT_AMBULATORY_CARE_PROVIDER_SITE_OTHER): Payer: Self-pay | Admitting: Gastroenterology

## 2024-02-13 DIAGNOSIS — K219 Gastro-esophageal reflux disease without esophagitis: Secondary | ICD-10-CM

## 2024-02-24 ENCOUNTER — Telehealth: Payer: Self-pay | Admitting: Cardiology

## 2024-02-24 ENCOUNTER — Ambulatory Visit: Payer: Self-pay | Admitting: Cardiology

## 2024-02-24 NOTE — Telephone Encounter (Signed)
 Pt in stating she had echo 4/15 but has not heard back about results yet. Please advise.

## 2024-02-24 NOTE — Telephone Encounter (Signed)
 Per Provider:  Echo shows heart pumping function is normal. No significant new changes from prior study, overall looks good Letta Raw MD      The patient has been notified of the result and verbalized understanding.  All questions (if any) were answered. Casper Clement, CMA 02/24/2024 4:32 PM

## 2024-03-20 DIAGNOSIS — H353122 Nonexudative age-related macular degeneration, left eye, intermediate dry stage: Secondary | ICD-10-CM | POA: Diagnosis not present

## 2024-03-20 DIAGNOSIS — H43812 Vitreous degeneration, left eye: Secondary | ICD-10-CM | POA: Diagnosis not present

## 2024-03-20 DIAGNOSIS — H353114 Nonexudative age-related macular degeneration, right eye, advanced atrophic with subfoveal involvement: Secondary | ICD-10-CM | POA: Diagnosis not present

## 2024-03-20 DIAGNOSIS — H353211 Exudative age-related macular degeneration, right eye, with active choroidal neovascularization: Secondary | ICD-10-CM | POA: Diagnosis not present

## 2024-03-29 ENCOUNTER — Ambulatory Visit: Payer: Self-pay | Admitting: Nurse Practitioner

## 2024-05-08 DIAGNOSIS — H353211 Exudative age-related macular degeneration, right eye, with active choroidal neovascularization: Secondary | ICD-10-CM | POA: Diagnosis not present

## 2024-05-08 DIAGNOSIS — H353122 Nonexudative age-related macular degeneration, left eye, intermediate dry stage: Secondary | ICD-10-CM | POA: Diagnosis not present

## 2024-05-08 DIAGNOSIS — H43812 Vitreous degeneration, left eye: Secondary | ICD-10-CM | POA: Diagnosis not present

## 2024-05-08 DIAGNOSIS — H353114 Nonexudative age-related macular degeneration, right eye, advanced atrophic with subfoveal involvement: Secondary | ICD-10-CM | POA: Diagnosis not present

## 2024-05-28 DIAGNOSIS — L728 Other follicular cysts of the skin and subcutaneous tissue: Secondary | ICD-10-CM | POA: Diagnosis not present

## 2024-05-28 DIAGNOSIS — L720 Epidermal cyst: Secondary | ICD-10-CM | POA: Diagnosis not present

## 2024-05-28 DIAGNOSIS — D485 Neoplasm of uncertain behavior of skin: Secondary | ICD-10-CM | POA: Diagnosis not present

## 2024-05-28 DIAGNOSIS — L814 Other melanin hyperpigmentation: Secondary | ICD-10-CM | POA: Diagnosis not present

## 2024-05-28 DIAGNOSIS — L738 Other specified follicular disorders: Secondary | ICD-10-CM | POA: Diagnosis not present

## 2024-06-12 DIAGNOSIS — I10 Essential (primary) hypertension: Secondary | ICD-10-CM | POA: Diagnosis not present

## 2024-06-12 DIAGNOSIS — J449 Chronic obstructive pulmonary disease, unspecified: Secondary | ICD-10-CM | POA: Diagnosis not present

## 2024-06-12 DIAGNOSIS — Z299 Encounter for prophylactic measures, unspecified: Secondary | ICD-10-CM | POA: Diagnosis not present

## 2024-06-12 DIAGNOSIS — Z79899 Other long term (current) drug therapy: Secondary | ICD-10-CM | POA: Diagnosis not present

## 2024-06-12 DIAGNOSIS — M549 Dorsalgia, unspecified: Secondary | ICD-10-CM | POA: Diagnosis not present

## 2024-06-12 DIAGNOSIS — I509 Heart failure, unspecified: Secondary | ICD-10-CM | POA: Diagnosis not present

## 2024-06-12 DIAGNOSIS — I421 Obstructive hypertrophic cardiomyopathy: Secondary | ICD-10-CM | POA: Diagnosis not present

## 2024-06-20 DIAGNOSIS — R499 Unspecified voice and resonance disorder: Secondary | ICD-10-CM | POA: Diagnosis not present

## 2024-06-20 DIAGNOSIS — J324 Chronic pansinusitis: Secondary | ICD-10-CM | POA: Diagnosis not present

## 2024-06-20 DIAGNOSIS — J3089 Other allergic rhinitis: Secondary | ICD-10-CM | POA: Diagnosis not present

## 2024-06-20 DIAGNOSIS — J330 Polyp of nasal cavity: Secondary | ICD-10-CM | POA: Diagnosis not present

## 2024-06-22 ENCOUNTER — Other Ambulatory Visit: Payer: Self-pay | Admitting: Cardiology

## 2024-07-02 ENCOUNTER — Encounter: Payer: Self-pay | Admitting: Cardiology

## 2024-07-02 ENCOUNTER — Ambulatory Visit: Attending: Cardiology | Admitting: Cardiology

## 2024-07-02 ENCOUNTER — Encounter: Payer: Self-pay | Admitting: *Deleted

## 2024-07-02 VITALS — BP 124/62 | HR 56 | Ht <= 58 in | Wt 150.0 lb

## 2024-07-02 DIAGNOSIS — I517 Cardiomegaly: Secondary | ICD-10-CM | POA: Insufficient documentation

## 2024-07-02 DIAGNOSIS — I471 Supraventricular tachycardia, unspecified: Secondary | ICD-10-CM | POA: Diagnosis not present

## 2024-07-02 DIAGNOSIS — I5032 Chronic diastolic (congestive) heart failure: Secondary | ICD-10-CM | POA: Diagnosis not present

## 2024-07-02 MED ORDER — FUROSEMIDE 20 MG PO TABS: 20.0000 mg | ORAL_TABLET | Freq: Every day | ORAL | 1 refills | Status: AC | PRN

## 2024-07-02 NOTE — Patient Instructions (Addendum)
 Medication Instructions:   Lasix  refilled today  Continue all other medications.     Labwork:  none  Testing/Procedures:  none  Follow-Up:  6 months   Any Other Special Instructions Will Be Listed Below (If Applicable).   If you need a refill on your cardiac medications before your next appointment, please call your pharmacy.

## 2024-07-02 NOTE — Progress Notes (Signed)
 Clinical Summary Ms. Test is a 78 y.o.female seen today for follow up of the following medical problems.    1. Chronic diastolic HF - admit 02/2022 few days after discharge for GI bleed with SOB, volume overload - thought secondary to pRBCs, IVFs from prior admission - 02/2022 echo LVEF 65-70%, grade II dd, normal RV       - chronic SOB overall stable, better with trelegy.  - has prn lasix , rarely needs. No recent LE edema     2. Leg cramps - usually in the morning with getting up - she reports her pcp had done a vascular screening exam and found possibly some changes in her circulation, they have ordered a dedication test of the lower extremities.    3. LVOT dynamic gradient -turbulent flow in LVOT noted on echo with SAM, symmetrical wall thicknessat 1.3 cm by my measure. I believe the turbulent LVOT flow is due to symmetric moderate hypertrophy with hyperdynamic LV. Would not pursue further imaging at this time    01/2024 echo: LVEF 60-65%, symmetrical 1.3 cm hypertrophy, limited Dopplers not able to clearly assess gradient.      4. Palpitations/PSVT - occurs daily, occurs at rest. Feeling of heart fluttering, lasts a few seconds - drinks 1 cup of coffee. Decaf tea only, no sodas. No EtOH, no energy drinsk - ongoing for a we months.    03/2022 6 day monitor: rare PACs, 27 runs SVT longest 14 sec. Rare PVCs  - started on lopressor  25mg  bid, low HRs lowered to 12.5mg  bid. Still ongoing bradycardia - occasional lightheadedness, no dizziness. Stopped metoprolol  and had some recurrent symptoms.    -seen by EP, fatigue on lopressor  changed to diltiazem  - some headache on dilt initially, but improved. No fatigue on dilt.      -reported chest pain on diltiazem , she stopped taking and went back to her lopressor . Initial change was made due to fatigue.    - daily, lasting just a few seconds generally.     4. Chronic LBBB     5. Asthma       6.History of GI  bleed - admit 02/2022 with GI bleed due to [employer] tear - EGD on 02/02/2022 showed Mallory-Weiss tear, clip placed, Hematin in entire stomach, single, recently bleeding angiodysplastic lesion in stomach, s/p argon beam coagulation - received 2 units pRBCs - noted AVMs stomach, small bowel   Past Medical History:  Diagnosis Date   Asthma    Chronic low back pain    COPD (chronic obstructive pulmonary disease) (HCC)    Depression    GERD (gastroesophageal reflux disease)    Guillain Barr syndrome    Heart murmur    Lobular carcinoma of left breast (HCC) 1997   in situ   Other and unspecified hyperlipidemia    Seasonal allergies      Allergies  Allergen Reactions   Aspirin     samter's syndrome   Codeine Itching   Sulfa Antibiotics     Unknown reaction   Other Rash    gel filled fentanyl  patch, adhesive on that patch caused ithcing     Current Outpatient Medications  Medication Sig Dispense Refill   acetaminophen  (TYLENOL ) 500 MG tablet Take 500 mg by mouth every 6 (six) hours as needed for moderate pain or headache.     alendronate (FOSAMAX) 70 MG tablet Take 70 mg by mouth once a week.     ALPRAZolam  (XANAX ) 0.5 MG tablet  Take 0.25-0.5 mg by mouth daily as needed.     Fluticasone -Umeclidin-Vilant (TRELEGY ELLIPTA) 200-62.5-25 MCG/ACT AEPB Inhale 1 puff into the lungs daily. 28 each 3   furosemide  (LASIX ) 20 MG tablet Take 1 tablet (20 mg total) by mouth as needed for edema (shorness of breath or swelling). (Patient taking differently: Take 20 mg by mouth daily as needed for edema (shorness of breath or swelling).)     HYDROcodone -acetaminophen  (NORCO/VICODIN) 5-325 MG tablet Take 1 tablet by mouth 2 (two) times daily as needed.     metoprolol  tartrate (LOPRESSOR ) 25 MG tablet TAKE 0.5 TABLETS BY MOUTH 2 TIMES DAILY. 90 tablet 1   montelukast  (SINGULAIR ) 10 MG tablet Take 10 mg by mouth at bedtime.     ondansetron  (ZOFRAN ) 4 MG tablet Take 1 tablet (4 mg total) by mouth  every 8 (eight) hours as needed for nausea. 90 tablet 0   pantoprazole  (PROTONIX ) 40 MG tablet TAKE 1 TABLET DAILY 90 tablet 1   sertraline  (ZOLOFT ) 25 MG tablet Take 25 mg by mouth daily.     simvastatin  (ZOCOR ) 20 MG tablet Take 20 mg by mouth daily.     vitamin B-12 (CYANOCOBALAMIN ) 1000 MCG tablet Take 1 tablet (1,000 mcg total) by mouth daily. 30 tablet 3   No current facility-administered medications for this visit.     Past Surgical History:  Procedure Laterality Date   BIOPSY  12/03/2020   Procedure: BIOPSY;  Surgeon: Golda Claudis PENNER, MD;  [facility]: [facility];  Service: Endoscopy;;  gastric polyps biopsies;   BREAST SURGERY Left    lumpectomy    CESAREAN SECTION  1986   CHOLECYSTECTOMY     COLONOSCOPY WITH PROPOFOL  N/A 12/03/2020    two 4-7 mm polyps in cecum, one small polyp in cecum, one 5 mm polyp, sigmoid diverticulosis, path with tubular adenomas   ENTEROSCOPY N/A 02/02/2022   Procedure: ENTEROSCOPY;  Surgeon: Eartha Angelia Sieving, MD;  [facility]: [facility];  Service: Gastroenterology;  Laterality: N/A;   ESOPHAGOGASTRODUODENOSCOPY (EGD) WITH PROPOFOL  N/A 12/03/2020   normal esophagus, multiple gastric polyps s/p biopsy, small paraesophageal hernia, normal duodenum. Fundic gland polyps.   ESOPHAGOGASTRODUODENOSCOPY (EGD) WITH PROPOFOL  N/A 02/05/2021   Procedure: ESOPHAGOGASTRODUODENOSCOPY (EGD) WITH PROPOFOL ;  Surgeon: Shaaron Lamar HERO, MD;  [facility]: [facility];  Service: Endoscopy;  Laterality: N/A;   ESOPHAGOGASTRODUODENOSCOPY (EGD) WITH PROPOFOL  N/A 03/03/2021   Procedure: ESOPHAGOGASTRODUODENOSCOPY (EGD) WITH PROPOFOL ;  Surgeon: Eartha Angelia Sieving, MD;  [facility];  Service: Gastroenterology;  Laterality: N/A;  with possible push enteroscopy utilizing the pediatric colonoscope   ESOPHAGOGASTRODUODENOSCOPY (EGD) WITH PROPOFOL  N/A 02/02/2022   Procedure: ESOPHAGOGASTRODUODENOSCOPY (EGD) WITH PROPOFOL ;  Surgeon: Eartha Angelia Sieving, MD;   [facility];  Service: Gastroenterology;  Laterality: N/A;   GIVENS CAPSULE STUDY N/A 12/16/2020   normal small bowel capsule but rapid transit time of 21 minutes. Recommended to resume alendronate.    GIVENS CAPSULE STUDY N/A 02/04/2021   Procedure: GIVENS CAPSULE STUDY;  Surgeon: Golda Claudis PENNER, MD;  [facility]: [facility];  Service: Endoscopy;  Laterality: N/A;   HOT HEMOSTASIS  02/05/2021   Procedure: HOT HEMOSTASIS (ARGON PLASMA COAGULATION/BICAP);  Surgeon: Shaaron Lamar HERO, MD;  [facility];  Service: Endoscopy;;   HOT HEMOSTASIS  03/03/2021   Procedure: HOT HEMOSTASIS (ARGON PLASMA COAGULATION/BICAP);  Surgeon: Eartha Angelia, Sieving, MD;  [facility];  Service: Gastroenterology;;   HOT HEMOSTASIS  02/02/2022   Procedure: HOT HEMOSTASIS (ARGON PLASMA COAGULATION/BICAP);  Surgeon: Eartha Flavors, Toribio, MD;  [facility]: [facility];  Service: Gastroenterology;;   TONSILLECTOMY     age 9     Allergies  Allergen Reactions   Aspirin     samter's syndrome   Codeine Itching   Sulfa Antibiotics     Unknown reaction   Other Rash    gel filled fentanyl  patch, adhesive on that patch caused ithcing      Family History  Problem Relation Age of Onset   Osteoporosis Mother    Other Father        head trauma   Breast cancer Sister        X's 2 sisters     Social History Ms. Coonan reports that she quit smoking about 40 years ago. Her smoking use included cigarettes. She started smoking about 59 years ago. She has a 10 pack-year smoking history. She has never used smokeless tobacco. Ms. Golla reports no history of alcohol  use.   Review of Systems CONSTITUTIONAL: No weight loss, fever, chills, weakness or fatigue.  HEENT: Eyes: No visual loss, blurred vision, double vision or yellow sclerae.No hearing loss, sneezing, congestion, runny nose or sore throat.  SKIN: No rash or itching.  CARDIOVASCULAR: RRR, no m/rg, no jvd RESPIRATORY: No  shortness of breath, cough or sputum.  GASTROINTESTINAL: No anorexia, nausea, vomiting or diarrhea. No abdominal pain or blood.  GENITOURINARY: No burning on urination, no polyuria NEUROLOGICAL: No headache, dizziness, syncope, paralysis, ataxia, numbness or tingling in the extremities. No change in bowel or bladder control.  MUSCULOSKELETAL: No muscle, back pain, joint pain or stiffness.  LYMPHATICS: No enlarged nodes. No history of splenectomy.  PSYCHIATRIC: No history of depression or anxiety.  ENDOCRINOLOGIC: No reports of sweating, cold or heat intolerance. No polyuria or polydipsia.  SABRA   Physical Examination Today's Vitals   07/02/24 1400  BP: 124/62  Pulse: (!) 56  SpO2: 96%  Weight: 150 lb (68 kg)  Height: 4' 10 (1.473 m)   Body mass index is 31.35 kg/m.  Gen: resting comfortably, no acute distress HEENT: no scleral icterus, pupils equal round and reactive, no palptable cervical adenopathy,  CV: RRR, no m/rg, no jvd Resp: Clear to auscultation bilaterally GI: abdomen is soft, non-tender, non-distended, normal bowel sounds, no hepatosplenomegaly MSK: extremities are warm, no edema.  Skin: warm, no rash Neuro:  no focal deficits Psych: appropriate affect   Diagnostic Studies  12/31/13 Echo LVEF 55-60%, normal diastolic function, mild LAE, mild to mod MR, mild TR   01/2014 stress echo Impressions:  - Normal study after maximal exercise. [facility] treadmill score   is 7 consistent with low risk for major cardiac events.   Very good functional capacity (140% of age and gender   predicted.   Clinic EKG reviewed, LBBB   04/01/16 Clinic EKG: NSR   03/2022 monitor   6 day monitor Rare supraventricular ectopy in the form of isolated PACs, couplets, triplets. 27 runs of SVT longest 13.6 seconds Rare ventricular ectopy in the form of isolated PVCs. Symptoms correlated with sinus rhythm and runs of SVT    01/2024 echo   1. There is mild SAM of the anterior MV leaflet.  Not able to assess  dynamic gradient based on available spectral Dopplers, signal looks to be  contominated with MR jet. . Left ventricular ejection fraction, by  estimation, is 60 to 65%. The left ventricle   has normal function. The left ventricle has no regional wall motion  abnormalities. There is  moderate left ventricular hypertrophy. Left  ventricular diastolic parameters are indeterminate. Elevated left atrial  pressure.   2. Right ventricular systolic function is normal. The right ventricular  size is normal. Tricuspid regurgitation signal is inadequate for assessing  PA pressure.   3. The mitral valve is abnormal. Moderate mitral valve regurgitation. No  evidence of mitral stenosis.   4. The tricuspid valve is abnormal.   5. The aortic valve is tricuspid. There is moderate calcification of the  aortic valve. There is moderate thickening of the aortic valve. Aortic  valve regurgitation is not visualized. No aortic stenosis is present.   6. The inferior vena cava is normal in size with greater than 50%  respiratory variability, suggesting right atrial pressure of 3 mmHg.    Assessment and Plan  1. PSVT/Tachy brady syndrome - chest pains on diltiazem , back on lopressor  12.5mg  bid and has done well. Lopressor  dosing limited by bradycardia -mild symptoms, continue current meds   2.Chronic HFpEF - euvolemic, continue prn lasix , has rarely needed   3. LVH - mild to mod LVH on echo stable even as far back as 2016 - she has some turbulent LVOT flow in setting of mild to mod symmetrical LVH with hyperdynamic LV function at age 45 and thus we have not pursued cMRI - no further workup at this time.       Dorn PHEBE Ross, M.D.,

## 2024-07-03 DIAGNOSIS — H353122 Nonexudative age-related macular degeneration, left eye, intermediate dry stage: Secondary | ICD-10-CM | POA: Diagnosis not present

## 2024-07-03 DIAGNOSIS — H353114 Nonexudative age-related macular degeneration, right eye, advanced atrophic with subfoveal involvement: Secondary | ICD-10-CM | POA: Diagnosis not present

## 2024-07-03 DIAGNOSIS — H43812 Vitreous degeneration, left eye: Secondary | ICD-10-CM | POA: Diagnosis not present

## 2024-07-03 DIAGNOSIS — H35372 Puckering of macula, left eye: Secondary | ICD-10-CM | POA: Diagnosis not present

## 2024-07-03 DIAGNOSIS — H353211 Exudative age-related macular degeneration, right eye, with active choroidal neovascularization: Secondary | ICD-10-CM | POA: Diagnosis not present

## 2024-07-18 ENCOUNTER — Encounter (INDEPENDENT_AMBULATORY_CARE_PROVIDER_SITE_OTHER): Payer: Self-pay | Admitting: Gastroenterology

## 2024-08-13 ENCOUNTER — Other Ambulatory Visit (INDEPENDENT_AMBULATORY_CARE_PROVIDER_SITE_OTHER): Payer: Self-pay | Admitting: Gastroenterology

## 2024-08-13 DIAGNOSIS — K219 Gastro-esophageal reflux disease without esophagitis: Secondary | ICD-10-CM

## 2024-08-15 DIAGNOSIS — L820 Inflamed seborrheic keratosis: Secondary | ICD-10-CM | POA: Diagnosis not present

## 2024-08-15 DIAGNOSIS — D485 Neoplasm of uncertain behavior of skin: Secondary | ICD-10-CM | POA: Diagnosis not present

## 2024-08-28 DIAGNOSIS — H353114 Nonexudative age-related macular degeneration, right eye, advanced atrophic with subfoveal involvement: Secondary | ICD-10-CM | POA: Diagnosis not present

## 2024-08-28 DIAGNOSIS — H43812 Vitreous degeneration, left eye: Secondary | ICD-10-CM | POA: Diagnosis not present

## 2024-08-28 DIAGNOSIS — H353211 Exudative age-related macular degeneration, right eye, with active choroidal neovascularization: Secondary | ICD-10-CM | POA: Diagnosis not present

## 2024-08-28 DIAGNOSIS — H35372 Puckering of macula, left eye: Secondary | ICD-10-CM | POA: Diagnosis not present

## 2024-08-28 DIAGNOSIS — H353122 Nonexudative age-related macular degeneration, left eye, intermediate dry stage: Secondary | ICD-10-CM | POA: Diagnosis not present

## 2024-12-20 ENCOUNTER — Ambulatory Visit: Admitting: Cardiology
